# Patient Record
Sex: Female | Born: 1935
Health system: Southern US, Community
[De-identification: ages and names within clinical notes are randomized; demographics above are authoritative.]

## PROBLEM LIST (undated history)

## (undated) DIAGNOSIS — I10 Essential (primary) hypertension: Secondary | ICD-10-CM

## (undated) DIAGNOSIS — H9190 Unspecified hearing loss, unspecified ear: Secondary | ICD-10-CM

## (undated) DIAGNOSIS — N904 Leukoplakia of vulva: Secondary | ICD-10-CM

## (undated) DIAGNOSIS — M8580 Other specified disorders of bone density and structure, unspecified site: Secondary | ICD-10-CM

## (undated) DIAGNOSIS — E875 Hyperkalemia: Secondary | ICD-10-CM

## (undated) DIAGNOSIS — R209 Unspecified disturbances of skin sensation: Secondary | ICD-10-CM

## (undated) DIAGNOSIS — H18519 Endothelial corneal dystrophy, unspecified eye: Secondary | ICD-10-CM

## (undated) DIAGNOSIS — M1990 Unspecified osteoarthritis, unspecified site: Secondary | ICD-10-CM

## (undated) DIAGNOSIS — G709 Myoneural disorder, unspecified: Secondary | ICD-10-CM

## (undated) DIAGNOSIS — I639 Cerebral infarction, unspecified: Secondary | ICD-10-CM

## (undated) DIAGNOSIS — H1851 Endothelial corneal dystrophy: Secondary | ICD-10-CM

## (undated) DIAGNOSIS — K5900 Constipation, unspecified: Secondary | ICD-10-CM

## (undated) DIAGNOSIS — Z5189 Encounter for other specified aftercare: Secondary | ICD-10-CM

## (undated) DIAGNOSIS — C801 Malignant (primary) neoplasm, unspecified: Secondary | ICD-10-CM

## (undated) DIAGNOSIS — T8859XA Other complications of anesthesia, initial encounter: Secondary | ICD-10-CM

## (undated) DIAGNOSIS — D649 Anemia, unspecified: Secondary | ICD-10-CM

## (undated) DIAGNOSIS — T4145XA Adverse effect of unspecified anesthetic, initial encounter: Secondary | ICD-10-CM

## (undated) DIAGNOSIS — H01006 Unspecified blepharitis left eye, unspecified eyelid: Secondary | ICD-10-CM

## (undated) DIAGNOSIS — E039 Hypothyroidism, unspecified: Secondary | ICD-10-CM

## (undated) HISTORY — PX: ABDOMINAL HYSTERECTOMY: SHX81

## (undated) HISTORY — DX: Unspecified blepharitis left eye, unspecified eyelid: H01.006

## (undated) HISTORY — PX: EYE SURGERY: SHX253

## (undated) HISTORY — DX: Hyperkalemia: E87.5

## (undated) HISTORY — DX: Leukoplakia of vulva: N90.4

## (undated) HISTORY — PX: OTHER SURGICAL HISTORY: SHX169

## (undated) HISTORY — PX: FRACTURE SURGERY: SHX138

## (undated) HISTORY — DX: Myoneural disorder, unspecified: G70.9

## (undated) HISTORY — PX: CATARACT EXTRACTION, BILATERAL: SHX1313

---

## 1987-05-09 DIAGNOSIS — C801 Malignant (primary) neoplasm, unspecified: Secondary | ICD-10-CM

## 1987-05-09 HISTORY — PX: MASTECTOMY: SHX3

## 1987-05-09 HISTORY — DX: Malignant (primary) neoplasm, unspecified: C80.1

## 1987-07-07 HISTORY — PX: BREAST SURGERY: SHX581

## 1998-05-24 ENCOUNTER — Ambulatory Visit (HOSPITAL_COMMUNITY): Admission: RE | Admit: 1998-05-24 | Discharge: 1998-05-24 | Payer: Self-pay | Admitting: *Deleted

## 1998-05-24 ENCOUNTER — Encounter: Payer: Self-pay | Admitting: *Deleted

## 1998-05-24 ENCOUNTER — Other Ambulatory Visit: Admission: RE | Admit: 1998-05-24 | Discharge: 1998-05-24 | Payer: Self-pay | Admitting: *Deleted

## 1998-09-07 ENCOUNTER — Ambulatory Visit (HOSPITAL_COMMUNITY): Admission: RE | Admit: 1998-09-07 | Discharge: 1998-09-07 | Payer: Self-pay | Admitting: Obstetrics & Gynecology

## 1999-06-14 ENCOUNTER — Other Ambulatory Visit: Admission: RE | Admit: 1999-06-14 | Discharge: 1999-06-14 | Payer: Self-pay | Admitting: *Deleted

## 1999-09-27 ENCOUNTER — Ambulatory Visit (HOSPITAL_COMMUNITY): Admission: RE | Admit: 1999-09-27 | Discharge: 1999-09-27 | Payer: Self-pay | Admitting: Orthopedic Surgery

## 1999-09-27 ENCOUNTER — Encounter: Payer: Self-pay | Admitting: Orthopedic Surgery

## 2000-04-02 ENCOUNTER — Encounter: Payer: Self-pay | Admitting: Orthopedic Surgery

## 2000-04-09 ENCOUNTER — Inpatient Hospital Stay (HOSPITAL_COMMUNITY): Admission: RE | Admit: 2000-04-09 | Discharge: 2000-04-14 | Payer: Self-pay | Admitting: Orthopedic Surgery

## 2000-04-09 ENCOUNTER — Encounter: Payer: Self-pay | Admitting: Orthopedic Surgery

## 2000-06-28 ENCOUNTER — Other Ambulatory Visit: Admission: RE | Admit: 2000-06-28 | Discharge: 2000-06-28 | Payer: Self-pay | Admitting: *Deleted

## 2001-06-08 HISTORY — PX: OTHER SURGICAL HISTORY: SHX169

## 2001-06-10 ENCOUNTER — Encounter: Payer: Self-pay | Admitting: Orthopedic Surgery

## 2001-06-17 ENCOUNTER — Inpatient Hospital Stay (HOSPITAL_COMMUNITY): Admission: RE | Admit: 2001-06-17 | Discharge: 2001-06-20 | Payer: Self-pay | Admitting: Orthopedic Surgery

## 2001-06-17 ENCOUNTER — Encounter: Payer: Self-pay | Admitting: Orthopedic Surgery

## 2003-05-09 HISTORY — PX: ANKLE SURGERY: SHX546

## 2004-05-16 ENCOUNTER — Encounter: Admission: RE | Admit: 2004-05-16 | Discharge: 2004-05-16 | Payer: Self-pay | Admitting: Internal Medicine

## 2006-10-09 ENCOUNTER — Ambulatory Visit (HOSPITAL_COMMUNITY): Admission: RE | Admit: 2006-10-09 | Discharge: 2006-10-10 | Payer: Self-pay | Admitting: Orthopedic Surgery

## 2010-09-20 NOTE — Op Note (Signed)
NAMEHAYLO, [name] NO.:  [id number]   MEDICAL RECORD NO.:  [id number]          PATIENT TYPE:  AMB   LOCATION:  DAY                          FACILITY:  [facility]   PHYSICIAN:  Marlowe Kays, M.D.  DATE OF BIRTH:  Mar 01, 1932   DATE OF PROCEDURE:  10/09/2006  DATE OF DISCHARGE:                               OPERATIVE REPORT   PREOPERATIVE DIAGNOSIS:  Torn peroneus brevis tendon, right lateral  ankle and foot.   POSTOPERATIVE DIAGNOSIS:  Torn peroneus brevis tendon, right lateral  ankle and foot.   OPERATION:  Repair of peroneus brevis tendon tear right ankle.   SURGEON:  Marlowe Kays, M.D.   ASSISTANT:  Nurse.   ANESTHESIA:  General.   PATHOLOGY AND JUSTIFICATION FOR PROCEDURE:  She injured her ankle in mid  January.  Because of persistent pain, swelling, we obtained an MRI which  demonstrated a split tear of her peroneus brevis tendon.  This was on  09/06/2006.  She ultimately elected to have repair because of persistent  pain and is here today for the repair.  The tear had extended somewhat  since the MRI and ultimately was 5 cm with roughly in length and  extending proximal to the tip of the fibula and down within a few  centimeters of its insertion.   PROCEDURE:  Prophylactic antibiotics, satisfactory general anesthesia,  time-out performed pneumatic tourniquet, 325 mmHg.  Right leg was  Esmarched out non sterilely and prepped from mid calf to toes with  DuraPrep and draped in sterile field.  I marked out the central  landmarks with the tip of the distal fibula, base of the fifth  metatarsal and the proximal course of the peroneal tendons.  And after  making skin incision she had a prominent nerve present which I felt was  likely the lateral cutaneous branch of the peroneal nerve.  I tagged  this with a vascular loop and dissected proximally and distally to avoid  any injury.  Superior to this, I was able to when the peroneal tendon  sheath.  A  large amount of reactive fluid came forth.  The peroneus  longus tendon and deep to it, the peroneus brevis tendon was identified.  Protecting again the cutaneous nerve,  I was able to find the most  proximal portion of the tear which was greatest at the distal peroneal  retinaculum.  I excised with a 115 knife blade a portion of the  degenerated and attempted repair of tendon.  I then continued to follow  the tendon proximally and with a tear ultimately going up proximal to  the tip of fibula.  After getting to the most proximal portion of the  tear, I then began distally and using running 2-0 Vicryl for  tubulizations type of repair began a running stitch leaving only good  healthy tendon externally all way up to its most proximal extent where  the suture was securely tied.  This seemed to give a nice stable repair.  Because one of the pathologic problems seemed to be a very tight  peroneal retinaculum, I loosely tied  the retinaculum about the extensor  brevis tendon to allow stability but not impingement.  The wound was  irrigated well with sterile saline.  Soft tissues infiltrated with half  percent plain Marcaine.  The skin, subcu tissue were  approximated with interrupted 3-0 nylon mattress sutures.  Betadine  Adaptic dry sterile dressing with a short-leg splint cast was applied.  Tourniquet was released.  She tolerated the procedure well was taken to  the recovery satisfactory condition with no known complications.           ______________________________  Marlowe Kays, M.D.     JA/MEDQ  D:  10/09/2006  T:  10/09/2006  Job:  [id number]

## 2010-09-23 NOTE — Discharge Summary (Signed)
[facility]  Patient:    [name], [name]               MRN: [id number] Adm. Date:  [id number] Disc. Date: [id number] Attending:  Loanne Drilling Dictator:   Druscilla Brownie Shela Nevin, P.A.-C. CC:         Ammie Dalton, M.D.   Discharge Summary  ADMITTING DIAGNOSES: 1. Degenerative joint disease right hip. 2. Hypothyroidism. 3. History of intraductal carcinoma. 4. Status post left mastectomy. 5. Occasional hemorrhoids.  DISCHARGE DIAGNOSES: 1. Degenerative joint disease right hip. 2. Hypothyroidism. 3. History of intraductal carcinoma. 4. Status post left mastectomy. 5. Occasional hemorrhoids. 6. Mild postoperative anemia.  OPERATION:  On April 09, 2000, the patient underwent right total hip replacement arthroplasty.  Avel Peace, P.A.-C. assisted.  BRIEF HISTORY:  This is a 77 year old female with significant osteoarthritis of the right hip.  She has gone on to have limited function and pain with ambulation which is markedly interfering with her day-to-day activities.  This pain is refractory to nonoperative management, and it is felt she would benefit from the above procedure and was admitted for same.  HOSPITAL COURSE:  The patient tolerated the surgical procedure quite well and was placed on total hip protocol postoperatively.  Drain was removed from the hip on the first postop day without difficulty, and dressing was changed periodically.  The wound remained dry.  She was placed on Coumadin protocol for prevention of DVT.  This will continue about three weeks after surgery. It was felt that the patient might benefit from the rehabilitation program. We asked for a rehab consult; however, she did so nicely that that was not necessary.  Care management saw the patient and did not think that the patient needed an inpatient rehab program.  The home arrangements were made for her care and physical therapy through [facility] and  arrangements were made for discharge.  Neurovascular is intact in the operative extremity, and vital signs were stable.  LABORATORY VALUES IN THE HOSPITAL:  Hematologically showed the preoperative CBC with a hemoglobin of 11.4, hematocrit 33.9, otherwise within normal limits.  Final hemoglobin was 9.3.  Blood chemistries all were normal. Urinalysis negative for urinary tract infection.  Chest x-ray showed "no acute disease."  Electrocardiogram showed sinus rhythm, normal ECG.  CONDITION ON DISCHARGE:  Improved and stable.  PLAN:  The patient is discharged to home in the care of her family to continue with total hip protocol at home.  Return to see Dr. Lequita Halt about two weeks after surgery.  Continue home medications and diet.  DISCHARGE MEDICATIONS:  We have given her Percocet 1 q.4-6h. p.r.n. pain, and she is to continue on the Coumadin protocol for about three weeks. DD:  04/14/00 TD:  04/15/00 Job: [id number]

## 2010-09-23 NOTE — Op Note (Signed)
[facility]  Patient:    [name], [name]               MRN: [id number] Proc. Date: 04/09/00 Adm. Date:  [id number] Attending:  Ollen Gross V                           Operative Report  PREOPERATIVE DIAGNOSIS:  Osteoarthritis of the right hip.  POSTOPERATIVE DIAGNOSIS:  Osteoarthritis of the right hip.  OPERATION PERFORMED:  Right total hip arthroplasty.  SURGEON:  Ollen Gross, M.D.  ASSISTANT:  Alexzandrew L. Perkins, P.A.-C.  ANESTHESIA:  Spinal.  ESTIMATED BLOOD LOSS:  200 cc.  DRAINS:  Hemovac x 1.  COMPLICATIONS:  None.  CONDITION:  Stable to recovery.  INDICATIONS FOR PROCEDURE:  [name] is a 77 year old female who has significant osteoarthritis of the right hip with limited function and pain refractory to nonoperative management.  She presents now for a total hip arthroplasty.  DESCRIPTION OF PROCEDURE:  After successful administration of spinal anesthetic, the patient was placed in left lateral decubitus position, right side up and held with hip positioner.  The right lower extremity was isolated from the perineum with plastic drapes and prepped and draped in the usual sterile fashion.  Standard posterolateral incisions were made.  Skin cut with a 10 blade, subcutaneous tissues to the level of the fascia lata which was incised in line with the skin incision.  Short external rotators were isolated off the femur.  Capsulectomy was then performed.  The hip was then dislocated and the center of femoral head marked.  Per preop templating, the trial was placed such that the center of the trial head is corresponding to the center of her native femoral head.  ____________  line was then marked on the femur and osteotomy made with the oscillating saw.  The femur was then retracted anteriorly.  Anterior capsule then removed.  Then retractors placed around the acetabulum.  Acetabular reaming was then initiated with 44  coursing up to a 49.  A size 50 pinnacle cup was impacted into the acetabulum with 40 degrees of abduction and 20 degrees of forward flexion.  It was transfixed with two dome screws.  A trial neutral with 28 mm liner was then placed.  Femoral preparation initiated first with the starter reamer and canal finder. The canal was then copiously irrigated and broaching started with a size 1 and size 2.  Per preop templating, size 3 would be most appropriate and a 3 high off-set neck is placed and a  ____________ 1.5 head placed.  The hip was then reduced and there was outstanding stability in full extension and full external rotation with 70 degrees flexion, 40 degrees adduction, 90 degrees internal rotation and 90 degrees flexion and 90 degrees internal rotation. Soft tissue tension is appropriate.  At this point the trials were removed, femur retracted anteriorly and the Apex hole eliminator and permanent 28 mm neutral Marathon liner impacted into the acetabular shell.  The canal has been prepared for cementing with the canal restrictor placed at the appropriate level.  It was then irrigated with pulsatile lavage, cement mixed and the cement was injected and pressured.  The permanent size 3 high offset Endurance Luster stem was then placed matching her native anteversion.  Once the cement was fully hardened, a 28 mm plus 1.5 head was placed.  The hip reduced and once again same stability parameters were noted.  A permanent 28 mm +1.5 head was then placed onto the femoral neck.  The hip was reduced and same stability parameters noted.  The wound was copiously irrigated with antibiotic solution.  Short external rotators were reattached to the femur through drill holes.  The fascia lata and fascia of the gluteus max were then closed over one limb of a Hemovac drain with interrupted #1 Vicryl.  Subcutaneous closed in two layers with interrupted #1 and 2-0 Vicryl and subcuticular with 4-0  Monocryl.  The incisions were clean and dry and Steri-Strips and bulky sterile dressing applied.  Drains hooked to suction. She was placed into a knee immobilizer, awakened and transported to recovery in stable condition. DD:  04/09/00 TD:  04/09/00 Job: [id number]

## 2010-09-23 NOTE — H&P (Signed)
[facility]  Patient:    [name], [name] Visit Number: [id number] MRN: [id number]          Service Type: Attending:  Ollen Gross, M.D. Dictated by:   Alexzandrew L. Perkins, P.A.-C. Adm. Date:  06/17/01   CC:         Gaspar Garbe, M.D., [facility], [address] [place], [address] [address], [place] [address]  Ollen Gross, M.D.   History and Physical  CHIEF COMPLAINT:  Left hip pain.  HISTORY OF PRESENT ILLNESS:  The patient is a 77 year old female who has been seen in follow up by Dr. Lequita Halt for hip pain.  She is approximately just a little over 1 year out from her right total hip replacement arthroplasty and has been doing quite well with her right hip.  She has had a great result. She is noted to have increasing pain in the left side with advancing degenerative osteoarthritis.  The left hip has progressed now to the point where it is continuing to interfere with her daily activities and the pain has reached a point where she would like to pursue a left total hip replacement arthroplasty.  She had benefit from undergoing the right hip arthroplasty and it is felt she is an appropriate candidate for a left hip replacement.  The risks and benefits of the procedure have been discussed with the patient and she has elected to proceed with surgery.  ALLERGIES:  No known drug allergies.  INTOLERANCES:  VIOXX caused increased blood pressure.  CELEBREX caused the inability to sleep.  The patient is LACTOSE INTOLERANCE and EPINEPHRINE causes "heart racing."  CURRENT MEDICATIONS: 1. Synthroid 0.15 mg per day. 2. Estradiol 1 mg daily. 3. Fosamax 70 mg every week. 4. Vitamin E, glucosamine and chondroitin, calcium supplements and multivitamin.  PAST MEDICAL HISTORY:  Hypothyroidism, osteoporosis, occasional hemorrhoids and also a history of intraductal breast carcinoma.  PAST SURGICAL HISTORY:  Left mastectomy  secondary to intraductal carcinoma in March of 1989.  Hysterectomy secondary to benign tumor in 1991.  She has undergone a right total hip replacement in December of 2001.  SOCIAL HISTORY:  The patient is single.  She is a retired Radiographer, therapeutic.  She denies any tobacco products at this time.  She does have a prior history of a 1 pack per day smoking history for approximately 10 years.  She quit at age 9.  Only occasional intake of alcohol.  FAMILY HISTORY:  Mother deceased age 41, secondary to Hodgkin disease.  Father deceased age 53 with a history of melanoma. She has 1 brother who is deceased secondary to a cancerous brain tumor.  She has 1 brother who has osteoporosis and also emphysema and also has a sister.  REVIEW OF SYSTEMS:  General:  No fevers, chills, night sweats.  Neurologic: No seizures or paralysis.  Respiratory:  The patient has a had a recent cold in January, however, this has resolved.  No shortness of breath, productive cough, or hemoptysis. Cardiovascular:  No chest pain, angina or orthopnea. GI:  No nausea, vomiting, diarrhea, or constipation.  GU:  No dysuria, hematuria or discharge.  Musculoskeletal:  Pertinent to the left hip in the history of present illness.  PHYSICAL EXAMINATION:  VITAL SIGNS:  Pulse 54, respirations 16, blood pressure 138/84.  GENERAL:  The patient is a 77 year old white female well-nourished well-developed, and appears to be in no acute distress.  She  is alert, oriented and cooperative.  She appears to be an excellent historian.  HEENT:  Normocephalic and atraumatic.  Pupils, equal, round, reactive.  EOMs are intact.  Oropharynx is clear.  NECK: Supple.  No carotid bruits are appreciated.  CHEST: Clear to auscultation anterior and posterior chest walls.  HEART:  Regular rate and rhythm, no murmurs, S1, S2 noted.  No rubs, thrills or palpitations.  ABDOMEN:  Soft, flat, nontender.  Bowel sounds are present.  RECTAL/BREAST AND  GENITALIA:  Not done.  Not pertinent to present illness.  EXTREMITIES:  Left lower extremity:  She has hip flexion approximately at 95 degrees, internal rotation at 30 degrees, external rotation 40 degrees, and abduction of 40 degrees.  She does have pain on passive range of motion on the left side and walks with somewhat of a mildly antalgic gait.  PREOPERATIVE CHEST X-RAY:  Sent over from [facility] states the lungs are clear and well expanded.  Heart and pulmonary vessels are normal.  No significant abnormalities in the noted in the regional skeleton. No active disease.  This is confirmed by Dr. Hubbard Robinson.  LEFT HIP FILMS:  Sent over from [facility] preadmitting shows severe osteoarthritis in the left hip.  EKG:  The EKG interpretation was sent over in a clearance letter which is read as an EKG was performed which shows normal sinus rhythm with a slightly bradycardia rate of 55 but no changes consistent with prior heart damage or ischemia is noted.  IMPRESSION: 1. Left hip osteoarthritis. 2. Hypothyroidism. 3. Osteopenia. 4. Hemorrhoids.  PLAN:  The patient will be admitted to [facility] to undergo left total hip arthroplasty.  Her medical physician is Dr. Guerry Bruin at [facility].  He will be notified of the room number admission and be consulted if needed for any medical assistance with this patient throughout the hospital course.Dictated by:   Alexzandrew L. Perkins, P.A.-C.  Attending:  Ollen Gross, M.D. DD:  06/13/01 TD:  06/13/01 Job: [id number]

## 2010-09-23 NOTE — Discharge Summary (Signed)
[facility]  Patient:    [name], [name] Visit Number: [id number] MRN: [id number]          Service Type: SUR Location: 4W 0464 01 Attending Physician:  Loanne Drilling Dictated by:   Druscilla Brownie Shela Nevin, P.A. Admit Date:  06/17/2001 Discharge Date: 06/20/2001   CC:         Gaspar Garbe, M.D.   Discharge Summary  ADMISSION DIAGNOSES: 1. Severe left hip osteoarthritis. 2. Hypothyroidism. 3. Osteopenia. 4. Hemorrhoids.  DISCHARGE DIAGNOSES: 1. Severe left hip osteoarthritis. 2. Hypothyroidism. 3. Osteopenia. 4. Hemorrhoids. 5. Mild postoperative anemia.  OPERATION:  On 06/17/01, the patient underwent left total hip replacement arthroplasty.  Avel Peace, P.A.-C. assisted.  CONSULTATIONS:  None.  HISTORY OF PRESENT ILLNESS:  This 77 year old lady who has had progressive and painful osteoarthritis of the left hip is having this problem interfering with her day to day activities with severe limitation.  She has nearly constant groin pain.  X-rays have shown deterioration of the joint.  After much discussion, it was felt this patient would benefit from surgical intervention, admitted for the above procedure.  HOSPITAL COURSE:  The patient tolerated the surgical procedure quite well. She entered readily and eagerly into the rehabilitation program postoperatively.  She was placed on Coumadin protocol postoperatively for the prevention of deep venous thrombosis, and will continue so for 3 weeks after surgery.  Wound remained dry without any evidence of infection.  She remained afebrile.  On the day of discharge, she was ambulating in the hall with little difficulty, home arrangements had been made.  Hemoglobin was 9.6, hematocrit was 28.3.  Electrolytes were okay.  She was to be discharged home in the care of her family with home health by [place].  LABORATORY DATA:  Hematologically, showed a CBC with  differential preoperatively which was within normal limits.  Again, final hemoglobin was 9.6, hematocrit 28.3 (which was coming up from previous hemoglobin and hematocrit).  Blood chemistries were normal.  Chest x-ray from [facility] showed no active disease.  Electrocardiogram showed normal sinus rhythm.  CONDITION ON DISCHARGE:  Improved and stable.  PLAN:  The patient will be discharged to her home in the care of her family and friends.  She will have home health via [place].  ACTIVITY:  Partial weightbearing of the operative extremity.  FOLLOWUP:  Dr. Lequita Halt this next Tuesday.  DISCHARGE MEDICATIONS: 1. Continue home medications. 2. Percocet 5 mg #40 one or two q.4-6h. p.r.n. pain, no refill. 3. Robaxin 500 mg #30 with one refill one q.6h. p.r.n. muscle spasm. 4. Tylenol over-the-counter p.r.n.  DIET:  Continue home diet. Dictated by:   Druscilla Brownie. Shela Nevin, P.A. Attending Physician:  Loanne Drilling DD:  06/20/01 TD:  06/20/01 Job: [id number]

## 2010-09-23 NOTE — H&P (Signed)
[facility]  Patient:    [name], [name]                      MRN: [id number] Adm. Date:  04/09/00 Attending:  Ollen Gross, M.D. Dictator:   Alexzandrew L. Julien Girt, P.A.-C. CC:         Ammie Dalton, M.D.   History and Physical  DATE OF BIRTH:  1932-01-01  DATE OF OFFICE VISIT:  April 02, 2000  CHIEF COMPLAINT:  Right hip pain.  HISTORY OF PRESENT ILLNESS:  The patient is a 77 year old female who is seen for a second opinion for right hip pain by Ollen Gross, M.D. She was subsequently referred over to Ollen Gross, M.D. for a second opinion. She was seen by Elana Alm. Thurston Hole, M.D. 3 months ago and was recommended to undergo hip replacement. She came in for evaluation of second opinion. A 77 year old female that has had increasing right hip discomfort for several years now. She is quite active and does much hiking. However, her activity level has been depleted and hindered by the increasing pain in her right hip. She says she has a very high threshold of pain. However, she is starting to have difficulty with daily activities in her active lifestyle. She comes in the office and found to have on x-rays joint-space narrowing on both sides on her AP pelvic film with nearly complete obliteration of the joint space on the right with some joint space remaining on the left. She just had bone-on-bone changes noted with cystic formation. It is felt she has reached a point where she could benefit from undergoing total hip replacement and arthroplasty. Risks and benefits of this have been discussed at length with her. She has elected to proceed with surgery.  ALLERGIES:  No known drug allergies.  INTOLERANCES:  VIOXX causes increased blood pressure. CELEBREX causes the inability to sleep. The patient is LACTOSE INTOLERANT. EPINEPHRINE causes "heart racing."  CURRENT MEDICATIONS: 1. Synthroid 0.15 mg daily. 2. Estrace 1 mg daily. 3. Vioxx 25  mg daily. 4. Multivitamin daily. 5. Glucosamine/chondroitin daily. 6. Vitamin E 400 IU daily. 7. Calcium with vitamin C two tablets daily. 8. Fosamax 70 mg one tablet once a week.  PAST MEDICAL HISTORY:  Hypothyroidism, osteoporosis, occasional hemorrhoids and history of intraductal breast carcinoma.  PAST SURGICAL HISTORY:  Left mastectomy, total abdominal hysterectomy with BSO, surgical repair of a crush injury to the right index finger tip.  SOCIAL HISTORY:  The patient is single. Denies use of tobacco products. Alcohol intake of one glass of wine in the evenings.  FAMILY HISTORY:  Mother deceased age 30 with Hodgkins disease. Father deceased age 74 with melanoma. Brother deceased age 81 secondary to a brain tumor.  REVIEW OF SYSTEMS:  GENERAL:  No fevers, chills, or night sweats. NEUROLOGICAL:  No seizures, syncope, or paralysis. RESPIRATORY:  No shortness of breath, productive cough, or hemoptysis. CARDIOVASCULAR:  No chest pain, angina, or orthopnea. GI:  No nausea, vomiting, some intermittent diarrhea. No constipation. No blood or mucus in the stool. GU:  No discharge, dysuria, or hematuria. MUSCULOSKELETAL:  Pertinent to that of the right hip found in the history of present illness.  PHYSICAL EXAMINATION:  GENERAL:  The patient is a 77 year old white female, thin frame, well-nourished, well-developed appears to be in no acute distress. She is noted to wear glasses.  VITAL SIGNS:  Blood pressure is elevated at 140/110, pulse 60, respirations are 16.  HEENT:  Normocephalic,  atraumatic. Pupils are round and reactive. EOMs are intact.  NECK:  Supple. No carotid bruits.  CHEST:  Clear to auscultation and percussion. No rhonchi or rales.  HEART:  Regular rate and rhythm. No murmurs of S1/S2 noted.  ABDOMEN:  Soft, flat, abdomen nontender to palpation. Bowel sounds are present.  GENITALIA/RECTAL/BREASTS:  Not done. Not pertinent to present illness.  EXTREMITIES:   Significant to the right lower extremity. Right hip flexion to 90 degrees, only 10 degrees of internal rotation, 20 degrees of external rotation, 40 degrees of abduction. Motor function is grossly intact. Movement of foot and toes well on exam.  LABORATORY DATA:  X-rays show bone-on-bone changes in the right hip with complete obliteration of the joint space with cystic formation.  IMPRESSION: 1. Degenerative joint disease, right hip. 2. Hypothyroidism. 3. History of intraductal carcinoma. 4. Status post left mastectomy. 5. Occasional hemorrhoids.  PLAN:  The patient will be admitted to [facility] to undergo right total hip replacement. Surgery will be performed by Ollen Gross, M.D. The patients medical physician is Ammie Dalton, M.D. Ammie Dalton, M.D. will be notified of the room number, and she will be consulted if needed for any medical assistance with this patient throughout the hospital course. DD:  04/02/00 TD:  04/02/00 Job: [id number]

## 2010-09-23 NOTE — Op Note (Signed)
[facility]  Patient:    [name], [name] Visit Number: [id number] MRN: [id number]          Service Type: SUR Location: [id number] Attending Physician:  Loanne Drilling Dictated by:   Ollen Gross, M.D. Proc. Date: 06/17/01 Admit Date:  06/17/2001                             Operative Report  PREOPERATIVE DIAGNOSIS:  Osteoarthritis left hip.  POSTOPERATIVE DIAGNOSIS:  Osteoarthritis left hip.  OPERATION/PROCEDURE:  Left total hip arthroplasty.  SURGEON:  Ollen Gross, M.D.  ASSISTANT:  Avel Peace.  ANESTHESIA:  Spinal.  ESTIMATED BLOOD LOSS:  600 cc.  DRAIN:  Hemovac x1.  COMPLICATIONS:  None.  CONDITION:  Stable to recovery.  BRIEF CLINICAL NOTE:  This patient is a 77 year old female with osteoarthritis of the left hip with pain and activity limitations with fracture now for operative management.  She presents now for left total hip arthroplasty.  PROCEDURE DETAILS:  After successful administration of spinal anesthetic the patient was placed in the right lateral decubitus position with the left side up in the LOA position.  The left lower extremity was isolated for perineoplasty and the adjacent area prepped and draped in the usual sterile fashion.  Mini-posterolateral incisions made with the 10 blade through the subcutaneous tissue to the level of the fascia lata which was incised in line of the deep incision.  The sciatic nerve was palpated and protected and the short rotator was isolated out the femur.  Capsulectomy was then performed, head dislocated and center of the femoral head marked.  Trial prosthesis was placed in the center of the trial head corresponds to the center of the femoral head.  Osteotomy was then made with an oscillating saw.  The femur was retracted anteriorly and anterior capsule removed and acetabular exposure obtained.  The acetabular reaming began on 45 proceeding in increments of 2 to 49.  A 50  mm pinnacle acetabular shell was then placed in anatomic position and transfixed with a normal screw.  Trial of 28 mm neutral liner is placed. The femur was prepared first with the canal finder.  The canal was irrigated and then broached to service a size 1 and a size 2.  Her other size was a size 3 high offset and we felt that this was going to be the same so we put a 3-high neck on there with a 28 +1.5 head.  Hip reduced with great stability, full extension, full external rotation in 70 degrees flexion, 40 degrees of adduction, 90 degrees in internal rotation, and 90 degrees flexion, 70 degrees internal rotation.  The wound was copiously irrigated and all trials removed.  The apex hole illuminator was placed into the acetabular shell and the permanent 28 mm neutral Marathon liner was placed.  Size 4 cement restrictor was most appropriate and was placed at the appropriate depth in the femoral canal and then then canal was irrigated with pulsatile lavage. The cement was mixed and once firm the locations were injected into the canal and pressurized.  Size 3 Endurance Luster high offset stem was placed matching remained with anteversion.  Extruded cement was removed.  Once the cement was fully hardened the permanent 28 +1.5 head was placed.  Reduce it seems to noted parameters.  The wound was copiously irrigated with antibiotic solution, short stem rotator was reattached to the femur through drill holes. Fascia  lata was closed over a Hemovac drain with interrupted #1 Vicryl subcu, closed with interrupted #1 and interrupted 2-0 Vicryl and subcuticular running 4-0 Monocryl.  The incision was then cleaned and dried and a bulky sterile dressing applied.  The patient was placed in a knee immobilizer, awakened, and transported to recovery in stable condition. Dictated by:   Ollen Gross, M.D. Attending Physician:  Loanne Drilling DD:  06/17/01 TD:  06/17/01 Job: [id number]

## 2011-02-23 LAB — HEMOGLOBIN AND HEMATOCRIT, BLOOD
HCT: 41.4
Hemoglobin: 13.7

## 2011-02-23 LAB — BASIC METABOLIC PANEL
CO2: 28
Calcium: 9.6
Creatinine, Ser: 0.67
GFR calc Af Amer: >60
GFR calc non Af Amer: >60
Sodium: 137

## 2011-02-23 LAB — PROTIME-INR: Prothrombin Time: 13

## 2011-05-04 ENCOUNTER — Ambulatory Visit (INDEPENDENT_AMBULATORY_CARE_PROVIDER_SITE_OTHER): Payer: Medicare Other

## 2011-05-04 DIAGNOSIS — I10 Essential (primary) hypertension: Secondary | ICD-10-CM

## 2011-05-04 DIAGNOSIS — M25559 Pain in unspecified hip: Secondary | ICD-10-CM

## 2011-07-27 ENCOUNTER — Other Ambulatory Visit: Payer: Self-pay | Admitting: Orthopedic Surgery

## 2011-07-27 ENCOUNTER — Ambulatory Visit (HOSPITAL_COMMUNITY)
Admission: RE | Admit: 2011-07-27 | Discharge: 2011-07-27 | Disposition: A | Discharge status: Home / Self Care | Payer: Medicare Other | Source: Ambulatory Visit | Attending: Orthopedic Surgery | Admitting: Orthopedic Surgery

## 2011-07-27 ENCOUNTER — Encounter (HOSPITAL_COMMUNITY): Payer: Self-pay

## 2011-07-27 ENCOUNTER — Encounter (HOSPITAL_COMMUNITY)
Admission: RE | Admit: 2011-07-27 | Discharge: 2011-07-27 | Disposition: A | Discharge status: Home / Self Care | Payer: Medicare Other | Source: Ambulatory Visit | Attending: Orthopedic Surgery | Admitting: Orthopedic Surgery

## 2011-07-27 ENCOUNTER — Encounter (HOSPITAL_COMMUNITY): Payer: Self-pay | Admitting: Pharmacy Technician

## 2011-07-27 ENCOUNTER — Other Ambulatory Visit: Payer: Self-pay

## 2011-07-27 DIAGNOSIS — Z01812 Encounter for preprocedural laboratory examination: Secondary | ICD-10-CM | POA: Insufficient documentation

## 2011-07-27 DIAGNOSIS — Y831 Surgical operation with implant of artificial internal device as the cause of abnormal reaction of the patient, or of later complication, without mention of misadventure at the time of the procedure: Secondary | ICD-10-CM | POA: Insufficient documentation

## 2011-07-27 DIAGNOSIS — I517 Cardiomegaly: Secondary | ICD-10-CM | POA: Insufficient documentation

## 2011-07-27 DIAGNOSIS — E039 Hypothyroidism, unspecified: Secondary | ICD-10-CM

## 2011-07-27 DIAGNOSIS — Z01818 Encounter for other preprocedural examination: Secondary | ICD-10-CM | POA: Insufficient documentation

## 2011-07-27 DIAGNOSIS — Z96649 Presence of unspecified artificial hip joint: Secondary | ICD-10-CM | POA: Insufficient documentation

## 2011-07-27 DIAGNOSIS — M1990 Unspecified osteoarthritis, unspecified site: Secondary | ICD-10-CM

## 2011-07-27 DIAGNOSIS — T84049A Periprosthetic fracture around unspecified internal prosthetic joint, initial encounter: Secondary | ICD-10-CM | POA: Insufficient documentation

## 2011-07-27 HISTORY — PX: SQUAMOUS CELL CARCINOMA EXCISION: SHX2433

## 2011-07-27 HISTORY — PX: JOINT REPLACEMENT: SHX530

## 2011-07-27 HISTORY — DX: Hypothyroidism, unspecified: E03.9

## 2011-07-27 HISTORY — DX: Unspecified osteoarthritis, unspecified site: M19.90

## 2011-07-27 HISTORY — DX: Encounter for other specified aftercare: Z51.89

## 2011-07-27 HISTORY — DX: Malignant (primary) neoplasm, unspecified: C80.1

## 2011-07-27 LAB — URINALYSIS, ROUTINE W REFLEX MICROSCOPIC
Bilirubin Urine: NEGATIVE
Nitrite: NEGATIVE
Specific Gravity, Urine: 1.023 (ref 1.005–1.030)
pH: 6 (ref 5.0–8.0)

## 2011-07-27 LAB — PROTIME-INR
INR: 0.94 (ref 0.00–1.49)
Prothrombin Time: 12.8 seconds (ref 11.6–15.2)

## 2011-07-27 LAB — COMPREHENSIVE METABOLIC PANEL
BUN: 15 mg/dL (ref 6–23)
CO2: 33 mEq/L — ABNORMAL HIGH (ref 19–32)
Chloride: 100 mEq/L (ref 96–112)
Creatinine, Ser: 0.63 mg/dL (ref 0.50–1.10)
GFR calc Af Amer: >90 mL/min (ref >90)
GFR calc non Af Amer: 84 mL/min — ABNORMAL LOW (ref >90)
Glucose, Bld: 105 mg/dL — ABNORMAL HIGH (ref 70–99)
Total Bilirubin: 0.3 mg/dL (ref 0.3–1.2)

## 2011-07-27 LAB — CBC
HCT: 38.5 % (ref 36.0–46.0)
MCV: 86.9 fL (ref 78.0–100.0)
RBC: 4.43 MIL/uL (ref 3.87–5.11)
WBC: 6.4 10*3/uL (ref 4.0–10.5)

## 2011-07-27 MED ORDER — CHLORHEXIDINE GLUCONATE 4 % EX LIQD: 60.0000 mL | Freq: Once | CUTANEOUS | Status: DC

## 2011-07-27 NOTE — Patient Instructions (Signed)
[address] AMMA CREAR  07/27/2011   Your procedure is scheduled on: 3-25  -2013  Report to [facility] at   3:00  PM.  Call this number if you have problems the morning of surgery: [phone]   Remember:   Do not eat food:After Midnight.  May have clear liquids:up to 6 Hours before arrival. Nothing after : 1100AM  Clear liquids include soda, tea, black coffee, apple or grape juice, broth.  Take these medicines the morning of surgery with A SIP OF WATER: Synthroid   Do not wear jewelry, make-up or nail polish.  Do not wear lotions, powders, or perfumes. You may wear deodorant.  Do not shave 48 hours prior to surgery.(face and neck okay, no shaving of legs)  Do not bring valuables to the hospital.  Contacts, dentures or bridgework may not be worn into surgery.  Leave suitcase in the car. After surgery it may be brought to your room.  For patients admitted to the hospital, checkout time is 11:00 AM the day of discharge.   Patients discharged the day of surgery will not be allowed to drive home.  Name and phone number of your driver: will arrange  Special Instructions: CHG Shower Use Special Wash: 1/2 bottle night before surgery and 1/2 bottle morning of surgery.(avoid face and genitals)   Please read over the following fact sheets that you were given: MRSA Information, Blood Transfusion fact sheet, Incentive Spirometry Instruction.

## 2011-07-27 NOTE — Pre-Procedure Instructions (Signed)
07-27-11 EKG/ CXR/  RT.Hip complete Xray done today.

## 2011-07-30 LAB — MRSA CULTURE

## 2011-07-31 ENCOUNTER — Encounter (HOSPITAL_COMMUNITY)
Admission: RE | Disposition: A | Discharge status: Home Health | Payer: Self-pay | Source: Ambulatory Visit | Attending: Orthopedic Surgery

## 2011-07-31 ENCOUNTER — Inpatient Hospital Stay (HOSPITAL_COMMUNITY)
Admission: RE | Admit: 2011-07-31 | Discharge: 2011-08-03 | DRG: 481 | Disposition: A | Discharge status: Home Health | Payer: Medicare Other | Source: Ambulatory Visit | Attending: Orthopedic Surgery | Admitting: Orthopedic Surgery

## 2011-07-31 ENCOUNTER — Encounter (HOSPITAL_COMMUNITY): Payer: Self-pay | Admitting: *Deleted

## 2011-07-31 ENCOUNTER — Ambulatory Visit (HOSPITAL_COMMUNITY): Payer: Medicare Other

## 2011-07-31 ENCOUNTER — Ambulatory Visit (HOSPITAL_COMMUNITY): Payer: Medicare Other | Admitting: Anesthesiology

## 2011-07-31 ENCOUNTER — Encounter (HOSPITAL_COMMUNITY): Payer: Self-pay | Admitting: Anesthesiology

## 2011-07-31 ENCOUNTER — Encounter (HOSPITAL_COMMUNITY): Payer: Self-pay | Admitting: Orthopedic Surgery

## 2011-07-31 DIAGNOSIS — S72309A Unspecified fracture of shaft of unspecified femur, initial encounter for closed fracture: Principal | ICD-10-CM | POA: Diagnosis present

## 2011-07-31 DIAGNOSIS — M1610 Unilateral primary osteoarthritis, unspecified hip: Secondary | ICD-10-CM | POA: Diagnosis present

## 2011-07-31 DIAGNOSIS — E871 Hypo-osmolality and hyponatremia: Secondary | ICD-10-CM

## 2011-07-31 DIAGNOSIS — M978XXA Periprosthetic fracture around other internal prosthetic joint, initial encounter: Secondary | ICD-10-CM

## 2011-07-31 DIAGNOSIS — E039 Hypothyroidism, unspecified: Secondary | ICD-10-CM | POA: Diagnosis present

## 2011-07-31 DIAGNOSIS — M169 Osteoarthritis of hip, unspecified: Secondary | ICD-10-CM | POA: Diagnosis present

## 2011-07-31 DIAGNOSIS — Z853 Personal history of malignant neoplasm of breast: Secondary | ICD-10-CM

## 2011-07-31 DIAGNOSIS — D62 Acute posthemorrhagic anemia: Secondary | ICD-10-CM

## 2011-07-31 DIAGNOSIS — Z9289 Personal history of other medical treatment: Secondary | ICD-10-CM

## 2011-07-31 DIAGNOSIS — W010XXA Fall on same level from slipping, tripping and stumbling without subsequent striking against object, initial encounter: Secondary | ICD-10-CM | POA: Diagnosis present

## 2011-07-31 DIAGNOSIS — Z96649 Presence of unspecified artificial hip joint: Secondary | ICD-10-CM

## 2011-07-31 HISTORY — PX: ORIF FEMUR FRACTURE: SHX2119

## 2011-07-31 SURGERY — OPEN REDUCTION INTERNAL FIXATION (ORIF) DISTAL FEMUR FRACTURE
Anesthesia: General | Site: Hip | Laterality: Right | Wound class: Clean

## 2011-07-31 MED ORDER — ACETAMINOPHEN 650 MG RE SUPP: 650.0000 mg | Freq: Four times a day (QID) | RECTAL | Status: DC | PRN

## 2011-07-31 MED ORDER — DEXAMETHASONE SODIUM PHOSPHATE 10 MG/ML IJ SOLN
10.0000 mg | Freq: Once | INTRAMUSCULAR | Status: DC
  Filled 2011-07-31: qty 1

## 2011-07-31 MED ORDER — POLYETHYLENE GLYCOL 3350 17 G PO PACK
17.0000 g | PACK | Freq: Every day | ORAL | Status: DC | PRN
  Administered 2011-08-02 – 2011-08-03 (×2): 17 g via ORAL
  Filled 2011-07-31 (×2): qty 1

## 2011-07-31 MED ORDER — MEPERIDINE HCL 50 MG/ML IJ SOLN
INTRAMUSCULAR | Status: AC
  Filled 2011-07-31: qty 1

## 2011-07-31 MED ORDER — SODIUM CHLORIDE 0.9 % IV SOLN
INTRAVENOUS | Status: DC
  Administered 2011-07-31: via INTRAVENOUS
  Administered 2011-08-01 – 2011-08-02 (×2): 20 mL/h via INTRAVENOUS

## 2011-07-31 MED ORDER — ACETAMINOPHEN 325 MG PO TABS
650.0000 mg | ORAL_TABLET | Freq: Four times a day (QID) | ORAL | Status: DC | PRN
  Administered 2011-08-02: 650 mg via ORAL
  Filled 2011-07-31: qty 2

## 2011-07-31 MED ORDER — CEFAZOLIN SODIUM 1-5 GM-% IV SOLN
1.0000 g | Freq: Four times a day (QID) | INTRAVENOUS | Status: AC
  Administered 2011-08-01 (×3): 1 g via INTRAVENOUS
  Filled 2011-07-31 (×3): qty 50

## 2011-07-31 MED ORDER — HYDROMORPHONE HCL PF 1 MG/ML IJ SOLN
0.2500 mg | INTRAMUSCULAR | Status: DC | PRN
  Administered 2011-07-31: 0.25 mg via INTRAVENOUS

## 2011-07-31 MED ORDER — RIVAROXABAN 10 MG PO TABS
10.0000 mg | ORAL_TABLET | Freq: Every day | ORAL | Status: DC
  Administered 2011-08-01 – 2011-08-03 (×3): 10 mg via ORAL
  Filled 2011-07-31 (×3): qty 1

## 2011-07-31 MED ORDER — ONDANSETRON HCL 4 MG PO TABS: 4.0000 mg | ORAL_TABLET | Freq: Four times a day (QID) | ORAL | Status: DC | PRN

## 2011-07-31 MED ORDER — FLEET ENEMA 7-19 GM/118ML RE ENEM: 1.0000 | ENEMA | Freq: Once | RECTAL | Status: AC | PRN

## 2011-07-31 MED ORDER — ACETAMINOPHEN 10 MG/ML IV SOLN
INTRAVENOUS | Status: AC
  Filled 2011-07-31: qty 100

## 2011-07-31 MED ORDER — OXYCODONE HCL 5 MG PO TABS
5.0000 mg | ORAL_TABLET | ORAL | Status: DC | PRN
  Administered 2011-08-01 (×2): 10 mg via ORAL
  Administered 2011-08-01: 5 mg via ORAL
  Administered 2011-08-01 (×2): 10 mg via ORAL
  Administered 2011-08-02 – 2011-08-03 (×7): 5 mg via ORAL
  Filled 2011-07-31: qty 1
  Filled 2011-07-31: qty 2
  Filled 2011-07-31 (×5): qty 1
  Filled 2011-07-31 (×2): qty 2
  Filled 2011-07-31: qty 1
  Filled 2011-07-31 (×2): qty 2
  Filled 2011-07-31: qty 1

## 2011-07-31 MED ORDER — BUPIVACAINE IN DEXTROSE 0.75-8.25 % IT SOLN
INTRATHECAL | Status: DC | PRN
  Administered 2011-07-31: 13 mg via INTRATHECAL

## 2011-07-31 MED ORDER — FENTANYL CITRATE 0.05 MG/ML IJ SOLN
INTRAMUSCULAR | Status: DC | PRN
  Administered 2011-07-31 (×2): 50 ug via INTRAVENOUS

## 2011-07-31 MED ORDER — TRAMADOL HCL 50 MG PO TABS
50.0000 mg | ORAL_TABLET | Freq: Four times a day (QID) | ORAL | Status: DC | PRN
  Administered 2011-08-03: 100 mg via ORAL
  Filled 2011-07-31: qty 2

## 2011-07-31 MED ORDER — ACETAMINOPHEN 10 MG/ML IV SOLN
1000.0000 mg | Freq: Four times a day (QID) | INTRAVENOUS | Status: AC
  Administered 2011-08-01 (×4): 1000 mg via INTRAVENOUS
  Filled 2011-07-31 (×4): qty 100

## 2011-07-31 MED ORDER — CEFAZOLIN SODIUM 1-5 GM-% IV SOLN
INTRAVENOUS | Status: AC
  Filled 2011-07-31: qty 100

## 2011-07-31 MED ORDER — MIDAZOLAM HCL 5 MG/5ML IJ SOLN
INTRAMUSCULAR | Status: DC | PRN
  Administered 2011-07-31: 2 mg via INTRAVENOUS

## 2011-07-31 MED ORDER — HYDROMORPHONE HCL PF 1 MG/ML IJ SOLN
INTRAMUSCULAR | Status: AC
  Filled 2011-07-31: qty 1

## 2011-07-31 MED ORDER — METHOCARBAMOL 500 MG PO TABS
500.0000 mg | ORAL_TABLET | Freq: Four times a day (QID) | ORAL | Status: DC | PRN
  Administered 2011-08-01 – 2011-08-03 (×5): 500 mg via ORAL
  Filled 2011-07-31 (×6): qty 1

## 2011-07-31 MED ORDER — EPHEDRINE SULFATE 50 MG/ML IJ SOLN
INTRAMUSCULAR | Status: DC | PRN
  Administered 2011-07-31 (×4): 5 mg via INTRAVENOUS

## 2011-07-31 MED ORDER — HETASTARCH-ELECTROLYTES 6 % IV SOLN
INTRAVENOUS | Status: DC | PRN
  Administered 2011-07-31: 20:00:00 via INTRAVENOUS

## 2011-07-31 MED ORDER — DEXTROSE 5 % IV SOLN
500.0000 mg | Freq: Four times a day (QID) | INTRAVENOUS | Status: DC | PRN
  Filled 2011-07-31: qty 5

## 2011-07-31 MED ORDER — BISACODYL 10 MG RE SUPP: 10.0000 mg | Freq: Every day | RECTAL | Status: DC | PRN

## 2011-07-31 MED ORDER — PROPOFOL 10 MG/ML IV BOLUS
INTRAVENOUS | Status: DC | PRN
  Administered 2011-07-31 (×2): 20 mg via INTRAVENOUS
  Administered 2011-07-31: 40 mg via INTRAVENOUS

## 2011-07-31 MED ORDER — MEPERIDINE HCL 50 MG/ML IJ SOLN
12.5000 mg | Freq: Once | INTRAMUSCULAR | Status: AC
  Administered 2011-07-31: 12.5 mg via INTRAVENOUS

## 2011-07-31 MED ORDER — SODIUM CHLORIDE 0.9 % IV SOLN: INTRAVENOUS | Status: DC

## 2011-07-31 MED ORDER — LACTATED RINGERS IV SOLN
INTRAVENOUS | Status: DC
Start: 2011-07-31 — End: 2011-07-31
  Administered 2011-07-31 (×2): via INTRAVENOUS
  Administered 2011-07-31: 1000 mL via INTRAVENOUS

## 2011-07-31 MED ORDER — MORPHINE SULFATE 2 MG/ML IJ SOLN
1.0000 mg | INTRAMUSCULAR | Status: DC | PRN
  Administered 2011-07-31 – 2011-08-01 (×2): 2 mg via INTRAVENOUS
  Filled 2011-07-31 (×2): qty 1

## 2011-07-31 MED ORDER — MIDAZOLAM HCL 2 MG/2ML IJ SOLN
INTRAMUSCULAR | Status: AC
  Filled 2011-07-31: qty 2

## 2011-07-31 MED ORDER — ONDANSETRON HCL 4 MG/2ML IJ SOLN
4.0000 mg | Freq: Four times a day (QID) | INTRAMUSCULAR | Status: DC | PRN
  Administered 2011-08-03: 4 mg via INTRAVENOUS
  Filled 2011-07-31: qty 2

## 2011-07-31 MED ORDER — ACETAMINOPHEN 10 MG/ML IV SOLN
1000.0000 mg | Freq: Once | INTRAVENOUS | Status: AC
  Administered 2011-07-31: 1000 mg via INTRAVENOUS
  Filled 2011-07-31: qty 100

## 2011-07-31 MED ORDER — MENTHOL 3 MG MT LOZG
1.0000 | LOZENGE | OROMUCOSAL | Status: DC | PRN
  Filled 2011-07-31: qty 9

## 2011-07-31 MED ORDER — CEFAZOLIN SODIUM-DEXTROSE 2-3 GM-% IV SOLR
2.0000 g | INTRAVENOUS | Status: AC
  Administered 2011-07-31: 1 g via INTRAVENOUS

## 2011-07-31 MED ORDER — LEVOTHYROXINE SODIUM 125 MCG PO TABS
125.0000 ug | ORAL_TABLET | Freq: Every day | ORAL | Status: DC
  Administered 2011-08-01 – 2011-08-03 (×3): 125 ug via ORAL
  Filled 2011-07-31 (×3): qty 1

## 2011-07-31 MED ORDER — METOCLOPRAMIDE HCL 10 MG PO TABS: 5.0000 mg | ORAL_TABLET | Freq: Three times a day (TID) | ORAL | Status: DC | PRN

## 2011-07-31 MED ORDER — METOCLOPRAMIDE HCL 5 MG/ML IJ SOLN: 5.0000 mg | Freq: Three times a day (TID) | INTRAMUSCULAR | Status: DC | PRN

## 2011-07-31 MED ORDER — PHENOL 1.4 % MT LIQD
1.0000 | OROMUCOSAL | Status: DC | PRN
  Filled 2011-07-31: qty 177

## 2011-07-31 MED ORDER — DOCUSATE SODIUM 100 MG PO CAPS
100.0000 mg | ORAL_CAPSULE | Freq: Two times a day (BID) | ORAL | Status: DC
  Administered 2011-07-31 – 2011-08-03 (×6): 100 mg via ORAL
  Filled 2011-07-31 (×7): qty 1

## 2011-07-31 MED ORDER — LACTATED RINGERS IV SOLN
INTRAVENOUS | Status: DC
Start: 2011-07-31 — End: 2011-07-31

## 2011-07-31 MED ORDER — PROPOFOL 10 MG/ML IV EMUL
INTRAVENOUS | Status: DC | PRN
  Administered 2011-07-31 (×2): 100 ug/kg/min via INTRAVENOUS

## 2011-07-31 MED ORDER — TEMAZEPAM 15 MG PO CAPS: 15.0000 mg | ORAL_CAPSULE | Freq: Every evening | ORAL | Status: DC | PRN

## 2011-07-31 MED ORDER — DIPHENHYDRAMINE HCL 12.5 MG/5ML PO ELIX: 12.5000 mg | ORAL_SOLUTION | ORAL | Status: DC | PRN

## 2011-07-31 SURGICAL SUPPLY — 49 items
BAG ZIPLOCK 12X15 (MISCELLANEOUS) ×2 IMPLANT
BIT DRILL 145X3.2XQCK CNCT (BIT) ×1 IMPLANT
BIT DRL 145X3.2XQCK CNCT (BIT) ×1
CLOTH BEACON ORANGE TIMEOUT ST (SAFETY) ×2 IMPLANT
Cable grip system (Cable) ×2 IMPLANT
Cable-ready bone plate (Plate) ×2 IMPLANT
Cable-ready cable grip system (Cable) ×6 IMPLANT
DRAPE INCISE IOBAN 66X45 STRL (DRAPES) ×2 IMPLANT
DRAPE ORTHO SPLIT 77X108 STRL (DRAPES) ×2
DRAPE POUCH INSTRU U-SHP 10X18 (DRAPES) ×2 IMPLANT
DRAPE SURG 17X11 SM STRL (DRAPES) ×2 IMPLANT
DRAPE SURG ORHT 6 SPLT 77X108 (DRAPES) ×2 IMPLANT
DRAPE U-SHAPE 47X51 STRL (DRAPES) ×2 IMPLANT
DRILL BIT QC 3.2X145 (BIT) ×1
DRSG ADAPTIC 3X8 NADH LF (GAUZE/BANDAGES/DRESSINGS) ×2 IMPLANT
DRSG EMULSION OIL 3X16 NADH (GAUZE/BANDAGES/DRESSINGS) ×2 IMPLANT
DRSG MEPILEX BORDER 4X12 (GAUZE/BANDAGES/DRESSINGS) ×2 IMPLANT
DRSG PAD ABDOMINAL 8X10 ST (GAUZE/BANDAGES/DRESSINGS) ×4 IMPLANT
DURAPREP 26ML APPLICATOR (WOUND CARE) ×2 IMPLANT
ELECT REM PT RETURN 9FT ADLT (ELECTROSURGICAL) ×2
ELECTRODE REM PT RTRN 9FT ADLT (ELECTROSURGICAL) ×1 IMPLANT
EVACUATOR 1/8 PVC DRAIN (DRAIN) IMPLANT
FACESHIELD LNG OPTICON STERILE (SAFETY) ×8 IMPLANT
GLOVE BIO SURGEON STRL SZ7.5 (GLOVE) ×2 IMPLANT
GLOVE BIO SURGEON STRL SZ8 (GLOVE) ×2 IMPLANT
GLOVE ORTHO TXT STRL SZ7.5 (GLOVE) ×2 IMPLANT
GOWN STRL NON-REIN LRG LVL3 (GOWN DISPOSABLE) ×2 IMPLANT
GOWN STRL REIN XL XLG (GOWN DISPOSABLE) ×2 IMPLANT
IMMOBILIZER KNEE 20 (SOFTGOODS) ×2
IMMOBILIZER KNEE 20 THIGH 36 (SOFTGOODS) ×1 IMPLANT
KIT BASIN OR (CUSTOM PROCEDURE TRAY) ×2 IMPLANT
MANIFOLD NEPTUNE II (INSTRUMENTS) ×2 IMPLANT
NS IRRIG 1000ML POUR BTL (IV SOLUTION) ×4 IMPLANT
PACK TOTAL JOINT (CUSTOM PROCEDURE TRAY) ×2 IMPLANT
POSITIONER SURGICAL ARM (MISCELLANEOUS) ×2 IMPLANT
SCREW CORTICAL 4.5X140MM (Screw) ×4 IMPLANT
SPONGE GAUZE 4X4 12PLY (GAUZE/BANDAGES/DRESSINGS) ×2 IMPLANT
SPONGE LAP 18X18 X RAY DECT (DISPOSABLE) ×2 IMPLANT
SPONGE LAP 4X18 X RAY DECT (DISPOSABLE) ×2 IMPLANT
STAPLER VISISTAT 35W (STAPLE) IMPLANT
STRIP CLOSURE SKIN 1/2X4 (GAUZE/BANDAGES/DRESSINGS) IMPLANT
SUT MNCRL AB 4-0 PS2 18 (SUTURE) IMPLANT
SUT VIC AB 1 CT1 27 (SUTURE) ×3
SUT VIC AB 1 CT1 27XBRD ANTBC (SUTURE) ×3 IMPLANT
SUT VIC AB 2-0 CT1 27 (SUTURE) ×3
SUT VIC AB 2-0 CT1 TAPERPNT 27 (SUTURE) ×3 IMPLANT
TOWEL OR 17X26 10 PK STRL BLUE (TOWEL DISPOSABLE) ×4 IMPLANT
TRAY FOLEY CATH 14FRSI W/METER (CATHETERS) IMPLANT
WATER STERILE IRR 1500ML POUR (IV SOLUTION) ×2 IMPLANT

## 2011-07-31 NOTE — Anesthesia Procedure Notes (Signed)
Spinal  Patient location during procedure: OR Start time: 07/31/2011 7:05 PM End time: 07/31/2011 7:15 PM Staffing Anesthesiologist: Ronelle Nigh L Performed by: anesthesiologist  Preanesthetic Checklist Completed: patient identified, site marked, surgical consent, pre-op evaluation, timeout performed, IV checked, risks and benefits discussed and monitors and equipment checked Spinal Block Patient position: right lateral decubitus Prep: Betadine Patient monitoring: heart rate, continuous pulse ox and blood pressure Location: L3-4 Injection technique: single-shot Needle Needle type: Spinocan  Needle gauge: 22 G Needle length: 9 cm Assessment Sensory level: T6 Additional Notes Expiration date of kit checked and confirmed. Patient tolerated procedure well, without complications.

## 2011-07-31 NOTE — Progress Notes (Signed)
No further shaking noted

## 2011-07-31 NOTE — Anesthesia Postprocedure Evaluation (Addendum)
  Anesthesia Post-op Note  Patient: [name]  Procedure(s) Performed: Procedure(s) (LRB): OPEN REDUCTION INTERNAL FIXATION (ORIF) DISTAL FEMUR FRACTURE (Right)  Patient Location: PACU  Anesthesia Type: spinal  Level of Consciousness: awake and alert   Airway and Oxygen Therapy: Patient Spontanous Breathing  Post-op Pain: mild  Post-op Assessment: Post-op Vital signs reviewed, Patient's Cardiovascular Status Stable, Respiratory Function Stable, Patent Airway and No signs of Nausea or vomiting  Post-op Vital Signs: stable  Complications: No apparent anesthesia complications

## 2011-07-31 NOTE — Transfer of Care (Signed)
Immediate Anesthesia Transfer of Care Note  Patient: [name]  Procedure(s) Performed: Procedure(s) (LRB): OPEN REDUCTION INTERNAL FIXATION (ORIF) DISTAL FEMUR FRACTURE (Right)  Patient Location: PACU  Anesthesia Type: Regional  Level of Consciousness: sedated  Airway & Oxygen Therapy: Patient Spontanous Breathing and Patient connected to face mask oxygen  Post-op Assessment: Report given to PACU RN and Post -op Vital signs reviewed and stable  Post vital signs: Reviewed and stable  Complications: No apparent anesthesia complications

## 2011-07-31 NOTE — Anesthesia Preprocedure Evaluation (Addendum)
Anesthesia Evaluation  Patient identified by MRN, date of birth, ID band Patient awake    Reviewed: Allergy & Precautions, H&P , NPO status , Patient's Chart, lab work & pertinent test results  Airway Mallampati: II TM Distance: >3 FB Neck ROM: full    Dental No notable dental hx.    Pulmonary neg pulmonary ROS,  breath sounds clear to auscultation  Pulmonary exam normal       Cardiovascular Exercise Tolerance: Good negative cardio ROS  Rhythm:regular Rate:Normal     Neuro/Psych negative neurological ROS  negative psych ROS   GI/Hepatic negative GI ROS, Neg liver ROS,   Endo/Other  negative endocrine ROSHypothyroidism   Renal/GU negative Renal ROS  negative genitourinary   Musculoskeletal   Abdominal   Peds  Hematology negative hematology ROS (+)   Anesthesia Other Findings   Reproductive/Obstetrics negative OB ROS                           Anesthesia Physical Anesthesia Plan  ASA: II  Anesthesia Plan: Spinal   Post-op Pain Management:    Induction:   Airway Management Planned: Simple Face Mask  Additional Equipment:   Intra-op Plan:   Post-operative Plan: Extubation in OR  Informed Consent: I have reviewed the patients History and Physical, chart, labs and discussed the procedure including the risks, benefits and alternatives for the proposed anesthesia with the patient or authorized representative who has indicated his/her understanding and acceptance.   Dental Advisory Given  Plan Discussed with: CRNA and Surgeon  Anesthesia Plan Comments:        Anesthesia Quick Evaluation

## 2011-07-31 NOTE — Progress Notes (Signed)
Demerol given for shaking  

## 2011-07-31 NOTE — Interval H&P Note (Signed)
History and Physical Interval Note:  07/31/2011 6:15 PM  [name]  has presented today for surgery, with the diagnosis of right femur peri prothetic fx   The various methods of treatment have been discussed with the patient and family. After consideration of risks, benefits and other options for treatment, the patient has consented to  Procedure(s) (LRB): OPEN REDUCTION INTERNAL FIXATION (ORIF) DISTAL FEMUR FRACTURE (Right) as a surgical intervention .  The patients' history has been reviewed, patient examined, no change in status, stable for surgery.  I have reviewed the patients' chart and labs.  Questions were answered to the patient's satisfaction.     Loanne Drilling

## 2011-07-31 NOTE — Preoperative (Signed)
Beta Blockers   Reason not to administer Beta Blockers:Not Applicable 

## 2011-07-31 NOTE — Brief Op Note (Signed)
07/31/2011  8:27 PM  PATIENT:  [name]  78 y.o. female  PRE-OPERATIVE DIAGNOSIS:  right femur peri prosthetic fx   POST-OPERATIVE DIAGNOSIS:  right femur peri prosthetic fx   PROCEDURE:  Procedure(s) (LRB): OPEN REDUCTION INTERNAL FIXATION (ORIF) DISTAL FEMUR FRACTURE (Right)  SURGEON:  Surgeon(s) and Role:    * Loanne Drilling, MD - Primary  PHYSICIAN ASSISTANT:   ASSISTANTS: Avel Peace, PA-C   ANESTHESIA:   spinal  EBL:  Total I/O In: 2500 [I.V.:2000; IV Piggyback:500] Out: 950 [Urine:850; Blood:100]  BLOOD ADMINISTERED:none  DRAINS: (Medium) Hemovact drain(s) in the right thigh with  Suction Open   LOCAL MEDICATIONS USED:  NONE  COUNTS:  YES  TOURNIQUET:  * No tourniquets in log *  DICTATION: .Other Dictation: Dictation Number [id number]  PLAN OF CARE: Admit to inpatient   PATIENT DISPOSITION:  PACU - hemodynamically stable.  Gus Rankin Cordie Beazley, MD    07/31/2011, 8:32 PM

## 2011-07-31 NOTE — H&P (Signed)
[name] is an 78 y.o. female.    Date of Admission:  07/31/2011  Chief Complaint: Right Thigh Pain  HPI: Patient is a 78 year old female well known to Dr. Lequita Halt who has undergone bilateral total hips in the past (Right THA - 04/2000, Left THA - 06/2001). She has been seen in the office for ongoing thigh pain.  She gave a history of multiples falls this over the past year and a half.  Her last fall was back in November 2012 when she "jammed" her right leg.  She underwent some massage therapy but continued to hobble around on a cane.  She was seen over [facility] [address] and was found to have difficulty lifting her thigh.  She has lumbar films taken at that time which were found to be negative.  An appointment was made with Dr. Rodolph Bong in the office where further x-rays were taken and found to show a periprosthetic right femur fracture.  She was placed on protective weight bearing and followed up with Dr. Lequita Halt.  The films were reviewed by Dr. Lequita Halt which already showed a little healing on the medial side of the femur and it appeared to be stable.  She was treated nonoperatively at that point but unfortunately on follow up again, x-rays showed progressive angulation with failure to heal.  It was felt that she would best be served by undergoing ORIF of the Right Periprosthetic Femur Fracture.  She is admitted for surgery.  Past Medical History  Diagnosis Date  . Hypothyroidism 3-21- 13    tx. levothyroxine  . Blood transfusion 07-27-11    post surgery some time ago  . Arthritis 07-27-11    osteoarthritis-hips/s/p bil. THA  . Cancer 07-27-11    Breast cancer -s/p lt. mastectomy, some squamous cell lesions    Past Surgical History  Procedure Date  . Abdominal hysterectomy   . Ankle surgery 07-27-11    right ankle tendon repair  . Mastectomy 07-27-11    left  . Cataract extraction, bilateral 07-27-11    Bilateral  . Squamous cell carcinoma excision 07-27-11    x2 left shin  .  Joint replacement 07-27-11    Bilateral: Right THA - 04/2000, Left THA 06/2001  . Breast surgery 07-27-11    lt.breast cancer intraductal cancer, mastectomy    Family History  Problem Relation Age of Onset  . Cancer Mother   . Cancer Father    Social History:  reports that she quit smoking about 59 years ago. She does not have any smokeless tobacco history on file. She reports that she drinks about .6 ounces of alcohol per week. She reports that she does not use illicit drugs.  Allergies:  Allergies  Allergen Reactions  . Epinephrine (Epipen) Palpitations   Medications:  Patient is to bring in her medications to be added to the chart.   Review of Systems  Constitutional: Negative.   HENT: Negative.   Respiratory: Negative.   Cardiovascular: Negative.   Gastrointestinal: Negative.   Genitourinary: Negative.   Musculoskeletal:       Positive for right thigh pain.   VITALS: Temp 97.0 F Pulse 99 Resp 18 BP 204/91  Physical Exam  Constitutional: She appears well-developed and well-nourished.  HENT:  Head: Normocephalic and atraumatic.  Eyes: EOM are normal. Pupils are equal, round, and reactive to light.  Neck: Normal range of motion. Neck supple.  Cardiovascular: Normal rate and regular rhythm.   Respiratory: Breath sounds normal.  GI: Soft. Bowel sounds are normal.  Musculoskeletal:       Right Hip and Leg - hip flexion causes considerable pain. Passive flexion to 90 degrees without pain but pain beyond this point. Pain with internal and external rotation. No obvious deformity is noted.  Pulses are intact. Sensation intact,     RADIOGRAPHS:  Xrays taken in the office show a Type 2 periprosthetic femur fracture with a stable-appearing femoral stem.  There is some healing and callous formation on the medial side of the fracture.  Lateral fracture line is without healing.  Assessment/Plan Right Periprosthetic Femur Fracture  Plan is to undergo an ORIF of the right  periprosthetic femur fracture by Dr. Lequita Halt.  Charizma Gardiner 07/31/2011, 4:20 PM

## 2011-08-01 LAB — CBC
HCT: 33.8 % — ABNORMAL LOW (ref 36.0–46.0)
MCHC: 32.2 g/dL (ref 30.0–36.0)
MCV: 87.3 fL (ref 78.0–100.0)
Platelets: 177 10*3/uL (ref 150–400)
RDW: 13.5 % (ref 11.5–15.5)
WBC: 11.5 10*3/uL — ABNORMAL HIGH (ref 4.0–10.5)

## 2011-08-01 LAB — BASIC METABOLIC PANEL
BUN: 10 mg/dL (ref 6–23)
Calcium: 8.3 mg/dL — ABNORMAL LOW (ref 8.4–10.5)
Creatinine, Ser: 0.58 mg/dL (ref 0.50–1.10)
GFR calc Af Amer: >90 mL/min (ref >90)
GFR calc non Af Amer: 86 mL/min — ABNORMAL LOW (ref >90)

## 2011-08-01 NOTE — Progress Notes (Signed)
Physical Therapy Treatment Patient Details Name: [name] MRN: [id number] DOB: 15-Aug-1931 Today's Date: 08/01/2011  R ORIF 2nd distal THR fx POD #1 PWB 15:35 - 16:00 1 ta  1 gt  PT Assessment/Plan  PT - Assessment/Plan Comments on Treatment Session: Pt requested to amb again with PT stated nursing. Assisted pt OOB and amb 15" before pt c/o max dizzyness, appeared grey in color, knees buckled and chair brought to place opt in recovery position.  Pt BP/RA within normal but HR was 61.  Returned to room with RN. PT Plan: Discharge plan remains appropriate PT Goals  Acute Rehab PT Goals PT Goal Formulation: With patient Pt will go Supine/Side to Sit: with modified independence PT Goal: Supine/Side to Sit - Progress: Progressing toward goal Pt will go Sit to Stand: with modified independence PT Goal: Sit to Stand - Progress: Progressing toward goal Pt will Ambulate: 51 - 150 feet;with modified independence;with rolling walker PT Goal: Ambulate - Progress: Progressing toward goal  PT Treatment Precautions/Restrictions  Precautions Precautions: Posterior Hip (per orders) Precaution Booklet Issued: Yes (comment) Precaution Comments: sign hung in room Required Braces or Orthoses: No Restrictions Weight Bearing Restrictions: Yes RLE Weight Bearing: Partial weight bearing RLE Partial Weight Bearing Percentage or Pounds: 25-50 Mobility (including Balance) Bed Mobility Bed Mobility: Yes (assisted pt OOB) Supine to Sit: 4: Min assist Supine to Sit Details (indicate cue type and reason): increased time and one VC on proper tech Transfers Transfers: Yes Sit to Stand: 4: Min assist;From bed Sit to Stand Details (indicate cue type and reason): good use of hands and safety cognition Stand to Sit: 2: Max assist;To chair/3-in-1 Stand to Sit Details: Pt demon mild syncope episode with c/o dizzyness, appeared grey in color and found to have a HR of 61 Ambulation/Gait Ambulation/Gait:  Yes Ambulation/Gait Assistance: 4: Min assist Ambulation/Gait Assistance Details (indicate cue type and reason): Pt requested to amb per nursing, but after 15' c/o dizzyness, appeared grey in color, knees buckled and chair brought to pt and placed in recovery position. Ambulation Distance (Feet): 15 Feet Assistive device: Rolling walker Gait Pattern: Step-to pattern Gait velocity: Pt doing well with PWB and c/o "soreness" vs pain Stairs: No Wheelchair Mobility Wheelchair Mobility: No    Exercise    End of Session PT - End of Session Equipment Utilized During Treatment: Gait belt Activity Tolerance: Treatment limited secondary to medical complications (Comment) (mild syncope episode) Patient left: in chair Nurse Communication: Other (comment) (mild syncope episode) General Behavior During Session: [facility] for tasks performed Cognition: [facility] for tasks performed

## 2011-08-01 NOTE — Progress Notes (Signed)
MILD SYNCOPE EPISODE Pt requested to amb again this afternoon, stated she felt better after eating.  Assisted pt OOB and amb in hallway but after 15' pt c/o max dizzyness, started to appear grey in color and buckled at the knees.  Pt placed in recovery position in recliner and found to have a HR of 61. RN notified. Pt returned to her room stating, "I'm fine".  Felecia Shelling  PTA [facility]  Acute  Rehab Pager     [phone]

## 2011-08-01 NOTE — Progress Notes (Signed)
OT Screen Order received, chart reviewed. Spoke briefly with pt who states she has all necessary DME and AE from previous hip sxs and will have prn A upon return home. Pt presents with no OT needs at this time. Will sign off.  Garrel Ridgel, OTR/L  Pager [phone] 08/01/2011

## 2011-08-01 NOTE — Evaluation (Signed)
Physical Therapy Evaluation Patient Details Name: [name] MRN: [id number] DOB: 1932-04-14 Today's Date: 08/01/2011  Problem List:  Patient Active Problem List  Diagnoses  . Peri-prosthetic fracture of femur following total hip arthroplasty    Past Medical History:  Past Medical History  Diagnosis Date  . Hypothyroidism 3-21- 13    tx. levothyroxine  . Blood transfusion 07-27-11    post surgery some time ago  . Arthritis 07-27-11    osteoarthritis-hips/s/p bil. THA  . Cancer 07-27-11    Breast cancer -s/p lt. mastectomy, some squamous cell lesions   Past Surgical History:  Past Surgical History  Procedure Date  . Abdominal hysterectomy   . Ankle surgery 07-27-11    right ankle tendon repair  . Mastectomy 07-27-11    left  . Cataract extraction, bilateral 07-27-11    Bilateral  . Squamous cell carcinoma excision 07-27-11    x2 left shin  . Joint replacement 07-27-11    Bilateral: Right THA - 04/2000, Left THA 06/2001  . Breast surgery 07-27-11    lt.breast cancer intraductal cancer, mastectomy    PT Assessment/Plan/Recommendation PT Assessment Clinical Impression Statement: 78 y.o. female admitted for ORIF of R distal femur. Pt presents with acute pain, decreased strength in RLE, decreased activity tolerance.  Plan is to DC home, excellent progress expected. Pt would benefit from acute PT to maximize safety and independence with mobility. PT Recommendation/Assessment: Patient will need skilled PT in the acute care venue PT Problem List: Decreased strength;Decreased activity tolerance;Decreased mobility;Decreased knowledge of precautions;Pain Barriers to Discharge: None PT Therapy Diagnosis : Acute pain;Difficulty walking PT Plan PT Frequency: 7X/week PT Treatment/Interventions: Gait training;Stair training;Functional mobility training;Therapeutic activities;Therapeutic exercise;Patient/family education PT Recommendation Recommendations for Other Services: OT  consult Follow Up Recommendations: Home health PT Equipment Recommended: None recommended by PT PT Goals  Acute Rehab PT Goals PT Goal Formulation: With patient Time For Goal Achievement: 4 days Pt will go Supine/Side to Sit: with modified independence;with HOB 0 degrees PT Goal: Supine/Side to Sit - Progress: Goal set today Pt will go Sit to Stand: with modified independence;with upper extremity assist PT Goal: Sit to Stand - Progress: Goal set today Pt will Ambulate: 51 - 150 feet;with modified independence;with rolling walker PT Goal: Ambulate - Progress: Goal set today Pt will Go Up / Down Stairs: 1-2 stairs;with rolling walker;with supervision PT Goal: Up/Down Stairs - Progress: Goal set today Pt will Perform Home Exercise Program: Independently PT Goal: Perform Home Exercise Program - Progress: Goal set today  PT Evaluation Precautions/Restrictions  Precautions Precautions: Posterior Hip (per orders) Precaution Booklet Issued: Yes (comment) Precaution Comments: sign hung in room Required Braces or Orthoses: No Restrictions Weight Bearing Restrictions: Yes RLE Weight Bearing: Partial weight bearing RLE Partial Weight Bearing Percentage or Pounds: 25-50 Prior Functioning  Home Living Lives With: Alone Receives Help From: Family;Friend(s) Type of Home: House Home Layout: One level (one interior step) Home Access: Level entry Home Adaptive Equipment: Walker - rolling;Shower chair with back;Bedside commode/3-in-1;Long-handled shoehorn;Reacher Prior Function Level of Independence: Requires assistive device for independence;Independent with basic ADLs;Independent with transfers (had to use walker since December 2012, very active prior) Able to Take Stairs?: Yes Cognition Cognition Arousal/Alertness: Awake/alert Overall Cognitive Status: Appears within functional limits for tasks assessed Orientation Level: Oriented X4 Sensation/Coordination Sensation Light Touch: Appears  Intact Coordination Gross Motor Movements are Fluid and Coordinated: Yes Fine Motor Movements are Fluid and Coordinated: Yes Extremity Assessment RUE Assessment RUE Assessment: Within Functional Limits LUE Assessment LUE Assessment:  Within Functional Limits RLE Assessment RLE Assessment: Exceptions to [facility] RLE Strength RLE Overall Strength: Deficits (ankle WNL, hip 2/5, knee 3/5) LLE Assessment LLE Assessment: Within Functional Limits Mobility (including Balance) Bed Mobility Bed Mobility: Yes Supine to Sit: 4: Min assist;HOB flat Supine to Sit Details (indicate cue type and reason): pt 75%, assist to support RLE Transfers Transfers: Yes Sit to Stand: 4: Min assist;From bed;With upper extremity assist Sit to Stand Details (indicate cue type and reason): pt 85% Stand to Sit: 4: Min assist;With armrests;To elevated surface;To chair/3-in-1;With upper extremity assist Stand to Sit Details: VCs hand placement Ambulation/Gait Ambulation/Gait: Yes Ambulation/Gait Assistance: 5: Supervision;4: Min assist Ambulation/Gait Assistance Details (indicate cue type and reason): min/guard assist due to lightheadedness Ambulation Distance (Feet): 5 Feet Assistive device: Rolling walker Gait Pattern: Step-to pattern    Exercise    End of Session PT - End of Session Equipment Utilized During Treatment: Gait belt Activity Tolerance: Patient limited by pain (limited by lightheadedness) Patient left: in chair;with call bell in reach Nurse Communication: Mobility status for transfers;Mobility status for ambulation General Behavior During Session: [facility] for tasks performed Cognition: [facility] for tasks performed  Tamala Ser 08/01/2011, 10:06 AM [phone]

## 2011-08-01 NOTE — Progress Notes (Signed)
Resident was noted dizzy while ambulating with therapy. Vital signs noted at 138/66,61,14 and O2 sat of 95% on RA. Fluids encouraged and continued to be in reclined position. Vital signs taken 10 minutes later at 1610 noted at 138/70,65,16 and O2 sat of 99% on room air.

## 2011-08-01 NOTE — Progress Notes (Signed)
Subjective: 1 Day Post-Op Procedure(s) (LRB): OPEN REDUCTION INTERNAL FIXATION (ORIF) DISTAL FEMUR FRACTURE (Right) Patient reports pain as mild.   Patient seen in rounds with Dr. Lequita Halt. Patient doing well so far this morning. We will start therapy today. Plan is to go home with family after hospital stay.  Objective: Vital signs in last 24 hours: Temp:  [96.7 F (35.9 C)-98.4 F (36.9 C)] 97.8 F (36.6 C) (03/26 0745) Pulse Rate:  [52-99] 61  (03/26 0745) Resp:  [8-19] 16  (03/26 0745) BP: (161-204)/(70-103) 163/75 mmHg (03/26 0745) SpO2:  [97 %-100 %] 98 % (03/26 0745) Weight:  [58.968 kg (130 lb)] 58.968 kg (130 lb) (03/25 2320)  Intake/Output from previous day:  Intake/Output Summary (Last 24 hours) at 08/01/11 0901 Last data filed at 08/01/11 0618  Gross per 24 hour  Intake 5013.75 ml  Output   3890 ml  Net 1123.75 ml    Intake/Output this shift:    Labs:  Basename 08/01/11 0410  HGB 10.9*    Basename 08/01/11 0410  WBC 11.5*  RBC 3.87  HCT 33.8*  PLT 177    Basename 08/01/11 0410  NA 135  K 3.7  CL 102  CO2 27  BUN 10  CREATININE 0.58  GLUCOSE 109*  CALCIUM 8.3*   No results found for this basename: LABPT:2,INR:2 in the last 72 hours  Exam - Neurovascular intact Sensation intact distally Intact pulses distally Dressing - clean, dry, no drainage Motor function intact - moving foot and toes well on exam.  Hemovac pulled without difficulty.  Past Medical History  Diagnosis Date  . Hypothyroidism 3-21- 13    tx. levothyroxine  . Blood transfusion 07-27-11    post surgery some time ago  . Arthritis 07-27-11    osteoarthritis-hips/s/p bil. THA  . Cancer 07-27-11    Breast cancer -s/p lt. mastectomy, some squamous cell lesions    Assessment/Plan: 1 Day Post-Op Procedure(s) (LRB): OPEN REDUCTION INTERNAL FIXATION (ORIF) DISTAL FEMUR FRACTURE (Right) Principal Problem:  *Peri-prosthetic fracture of femur following total hip  arthroplasty   Advance diet Up with therapy Discharge home with home health when met goals  DVT Prophylaxis - Xarelto  Protocol Partial Weight-Bearing 25-50% to right leg No vaccines. D/C O2 and try on Room [address]  Patrica Duel 08/01/2011, 9:01 AM

## 2011-08-02 LAB — CBC
HCT: 26.8 % — ABNORMAL LOW (ref 36.0–46.0)
MCHC: 33.6 g/dL (ref 30.0–36.0)
MCV: 86.2 fL (ref 78.0–100.0)
Platelets: 171 10*3/uL (ref 150–400)
RBC: 3.11 MIL/uL — ABNORMAL LOW (ref 3.87–5.11)
RDW: 13.5 % (ref 11.5–15.5)
WBC: 9 10*3/uL (ref 4.0–10.5)

## 2011-08-02 LAB — BASIC METABOLIC PANEL
BUN: 12 mg/dL (ref 6–23)
Chloride: 99 mEq/L (ref 96–112)
GFR calc Af Amer: >90 mL/min (ref >90)
GFR calc non Af Amer: 83 mL/min — ABNORMAL LOW (ref >90)
Potassium: 4.1 mEq/L (ref 3.5–5.1)
Sodium: 130 mEq/L — ABNORMAL LOW (ref 135–145)

## 2011-08-02 LAB — SAMPLE TO BLOOD BANK

## 2011-08-02 MED ORDER — FUROSEMIDE 10 MG/ML IJ SOLN
10.0000 mg | Freq: Once | INTRAMUSCULAR | Status: AC
  Administered 2011-08-02: 10 mg via INTRAVENOUS
  Filled 2011-08-02: qty 1

## 2011-08-02 MED ORDER — ACETAMINOPHEN 325 MG PO TABS
650.0000 mg | ORAL_TABLET | Freq: Once | ORAL | Status: AC
  Administered 2011-08-02: 650 mg via ORAL
  Filled 2011-08-02: qty 2

## 2011-08-02 MED ORDER — SIMETHICONE 40 MG/0.6ML PO SUSP
40.0000 mg | Freq: Four times a day (QID) | ORAL | Status: DC | PRN
  Administered 2011-08-02 (×2): 40 mg via ORAL
  Filled 2011-08-02 (×2): qty 0.6

## 2011-08-02 NOTE — Progress Notes (Signed)
Subjective: 2 Days Post-Op Procedure(s) (LRB): OPEN REDUCTION INTERNAL FIXATION (ORIF) DISTAL FEMUR FRACTURE (Right) Patient reports pain as mild and moderate.   Patient has complaints of pain in the thigh which is swollen.  She did get some lightheadedness with therapy.  HGB is down from yesterday.  Due to the drop in HGB and the symptoms, will give blood today.  Objective: Vital signs in last 24 hours: Temp:  [98.3 F (36.8 C)-99.6 F (37.6 C)] 99.1 F (37.3 C) (03/27 1400) Pulse Rate:  [66-76] 76  (03/27 1400) Resp:  [16] 16  (03/27 1400) BP: (136-166)/(66-73) 166/73 mmHg (03/27 1400) SpO2:  [4 %-99 %] 4 % (03/27 1400)  Intake/Output from previous day:  Intake/Output Summary (Last 24 hours) at 08/02/11 1528 Last data filed at 08/02/11 1335  Gross per 24 hour  Intake 566.67 ml  Output   1775 ml  Net -1208.33 ml    Intake/Output this shift: Total I/O In: 120 [P.O.:120] Out: 1000 [Urine:1000]  Labs:  [facility] 08/02/11 0415 08/01/11 0410  HGB 9.0* 10.9*    Basename 08/02/11 0415 08/01/11 0410  WBC 9.0 11.5*  RBC 3.11* 3.87  HCT 26.8* 33.8*  PLT 171 177    Basename 08/02/11 0415 08/01/11 0410  NA 130* 135  K 4.1 3.7  CL 99 102  CO2 27 27  BUN 12 10  CREATININE 0.64 0.58  GLUCOSE 109* 109*  CALCIUM 8.2* 8.3*   No results found for this basename: LABPT:2,INR:2 in the last 72 hours  Exam - Neurovascular intact Sensation intact distally Dressing/Incision - clean, dry, healing Motor function intact - moving foot and toes well on exam. Right thigh is swollen.  Past Medical History  Diagnosis Date  . Hypothyroidism 3-21- 13    tx. levothyroxine  . Blood transfusion 07-27-11    post surgery some time ago  . Arthritis 07-27-11    osteoarthritis-hips/s/p bil. THA  . Cancer 07-27-11    Breast cancer -s/p lt. mastectomy, some squamous cell lesions    Assessment/Plan: 2 Days Post-Op Procedure(s) (LRB): OPEN REDUCTION INTERNAL FIXATION (ORIF) DISTAL FEMUR  FRACTURE (Right) Principal Problem:  *Peri-prosthetic fracture of femur following total hip arthroplasty   Advance diet Up with therapy Continue foley due to blood transfusion; will continue until blood transfusion complete  DVT Prophylaxis - Xarelto Protocol Partial-Weight Bearing 25-50% right Leg  PERKINS, ALEXZANDREW 08/02/2011, 3:28 PM

## 2011-08-02 NOTE — Clinical Documentation Improvement (Signed)
     Anemia Blood Loss Clarification  THIS DOCUMENT IS NOT A PERMANENT PART OF THE MEDICAL RECORD  RESPOND TO THE THIS QUERY, FOLLOW THE INSTRUCTIONS BELOW:  1. If needed, update documentation for the patient's encounter via the notes activity.  2. Access this query again and click edit on the In [employer].  3. After updating, or not, click F2 to complete all highlighted (required) fields concerning your review. Select "additional documentation in the medical record" OR "no additional documentation provided".  4. Click Sign note button.  5. The deficiency will fall out of your In Basket *Please let us know if you are not able to complete this workflow by phone or e-mail (listed below).        08/02/11  Dear Dr. Lequita Halt, [name]  In an effort to better capture your patient's severity of illness, reflect appropriate length of stay and utilization of resources, a review of the patient medical record has revealed the following indicators.    Based on your clinical judgment, please clarify and document in a progress note and/or discharge summary the clinical condition associated with the following supporting information:  In responding to this query please exercise your independent judgment.  The fact that a query is asked, does not imply that any particular answer is desired or expected.  Pt admitted with Right Periprosthetic Femur Fracture and ORIF  According to lab pt's H/H= 9.0/26.8 in setting of ORIF   Please clarify based on abnormal H/H whether or not  H/H= 9.0/26.8 can be further specified as one of the diagnoses listed below and document in pn or d/c summary.     Possible Clinical Conditions?   " Expected Acute Blood Loss Anemia  " Acute Blood Loss Anemia  " Acute on chronic blood loss anemia  " Other Condition________________  " Cannot Clinically Determine  Risk Factors: (recent surgery, pre op anemia, EBL in OR)  Supporting Information: Right  Periprosthetic Femur Fracture / ORIF Signs and Symptoms  Abn H/H  Diagnostics: Component     Latest Ref Rng 08/01/2011 08/02/2011  HGB     12.0 - 15.0 g/dL 16.1 (L) 9.0 (L)  HCT     36.0 - 46.0 % 33.8 (L) 26.8 (L)   Treatments: Serial H&H monitoring 0.9 % sodium chloride infusion   Reviewed: additional documentation in the medical record  Thank You,  Enis Slipper RN, BSN, CCDS Clinical Documentation Specialist [facility] HIM Dept Pager: [phone] / E-mail: [email or web address]  Health Information Management [place]

## 2011-08-02 NOTE — Op Note (Signed)
NAMEMATEYA, TORTI NO.:  [id number]  MEDICAL RECORD NO.:  [id number]  LOCATION:  1604                         FACILITY:  [facility]  PHYSICIAN:  Ollen Gross, M.D.    DATE OF BIRTH:  06/07/1931  DATE OF PROCEDURE: DATE OF DISCHARGE:                              OPERATIVE REPORT   PREOPERATIVE DIAGNOSIS:  Right periprosthetic femur fracture.  POSTOPERATIVE DIAGNOSIS:  Right periprosthetic femur fracture.  PROCEDURE:  Open reduction and internal fixation of right periprosthetic femur fracture.  SURGEON:  Ollen Gross, M.D.  ASSISTANT:  Alexzandrew L. Perkins, P.A.C.  ANESTHESIA:  Spinal.  ESTIMATED BLOOD LOSS:  100.  DRAINS:  Hemovac x1.  COMPLICATIONS:  None.  CONDITION:  Stable to recovery.  BRIEF CLINICAL NOTE:  Monroe is a 78 year old female, who had a few falls over the past year, and then started having increasing thigh pain around November 2012.  X-rays showed a nondisplaced unicortical periprosthetic femur fracture.  She had been on protected weightbearing.  __________ healing, so let her put more weight on and unfortunately it is angulated.  She has had increased pain.  We decided to take her to the operating room for open reduction and internal fixation of this fracture to prevent it from bowing more and to allow it to heal in anatomic position.  PROCEDURE IN DETAIL:  After successful administration of spinal anesthetic, the patient was placed in the left lateral decubitus position with the right side up and held with the hip positioner.  Right lower extremity was isolated from her perineum with plastic drapes and prepped and draped in the usual sterile fashion.  An incision was made over the lateral thigh centered at the area of the fracture.  I did a 10- inch incision.  Skin was cut with #10 blade through subcutaneous tissue to the fascia lata, which was incised in line with the skin incision. The fascia and the vastus lateralis were then  incised and the muscle elevated off the posterior intermuscular septum.  Perforators identified and cauterized.  Fracture sites identified by some hypertrophic callus. The callus was excised and showed that the fracture is not healed at this site.  The fibrous tissue at the fracture site was removed.  Some bone graft was then placed into it.  A 6-hole Zimmer plate was then placed in the lateral cortex of the femur.  Cables were passed circumferentially around the femur; 3 proximal to the fracture, and 1 distal to the fracture.  One of the cables proximal to the fracture was then tightened and held in apposition.  I then placed 2 screws in the distal holes distal to the fracture, both 40 mm cortical screws with excellent purchase.  We then sequentially tightened the 3 cables proximal to the fracture site effectively reducing the plate to the femur and effectively anatomical reducing the fracture site eliminating the gap there.  Once they were adequately tightened, then we also tightened the cable distal to the fracture site.  Once they were all tightened, then we crimped them with the screw and then cut the cables. This led to a very effective stable reduction of this fracture.  All minor bleeding was cauterized  and the wound copiously irrigated with saline solution.  The muscle and the vastus lateralis fascia were then reapproximated and closed.  The muscle was tacked back down to the intermuscular septum.  Hemovac drain was then placed, and the fascia lata closed over Hemovac drain with interrupted #1 Vicryl.  Subcu was closed with interrupted 2-0 Vicryl and subcuticular running 4-0 Monocryl.  Incisions cleaned and dried and Steri-Strips and a bulky sterile dressing applied.  She was then placed into a knee immobilizer, awakened, and transported to recovery in stable condition.     Ollen Gross, M.D.     FA/MEDQ  D:  07/31/2011  T:  08/02/2011  Job:  [id number]

## 2011-08-02 NOTE — Progress Notes (Signed)
Physical Therapy Treatment Patient Details Name: [name] MRN: [id number] DOB: [birth year] Today's Date: 08/02/2011  PT Assessment/Plan  PT - Assessment/Plan Comments on Treatment Session: pt to get blood this pm, although less symptomatic wtih amb, pt reports feeling stif and sore versus lots of pain PT Plan: Discharge plan remains appropriate PT Frequency: 7X/week Follow Up Recommendations: Home health PT Equipment Recommended: Rolling walker with 5" wheels PT Goals  Acute Rehab PT Goals Pt will go Supine/Side to Sit: with modified independence PT Goal: Supine/Side to Sit - Progress: Progressing toward goal Pt will go Sit to Stand: with modified independence PT Goal: Sit to Stand - Progress: Progressing toward goal Pt will Ambulate: 51 - 150 feet;with modified independence;with rolling walker PT Goal: Ambulate - Progress: Progressing toward goal  PT Treatment Precautions/Restrictions  Precautions Precautions: Posterior Hip Precaution Booklet Issued: Yes (comment) Precaution Comments: sign hung in room Required Braces or Orthoses: No Restrictions Weight Bearing Restrictions: Yes RLE Weight Bearing: Partial weight bearing RLE Partial Weight Bearing Percentage or Pounds: 25-50% Mobility (including Balance) Bed Mobility Supine to Sit: 4: Min assist Supine to Sit Details (indicate cue type and reason): cues for scooting, THP Transfers Sit to Stand: 4: Min assist;From bed Sit to Stand Details (indicate cue type and reason): cues for hand placement and THP, right LE position Stand to Sit: 4: Min assist;To bed;With upper extremity assist Stand to Sit Details: same as above Ambulation/Gait Ambulation/Gait Assistance: 4: Min assist Ambulation/Gait Assistance Details (indicate cue type and reason): cues for sequence and getting heel down on right, improved with distance Ambulation Distance (Feet): 80 Feet Assistive device: Rolling walker Gait Pattern: Step-to pattern      Exercise  Total Joint Exercises Ankle Circles/Pumps: AROM;20 reps;Supine Quad Sets: AROM;Both;10 reps;Supine Heel Slides: AAROM;Right;10 reps;Supine End of Session PT - End of Session Equipment Utilized During Treatment: Gait belt Activity Tolerance: Patient tolerated treatment well Patient left: in bed;with call bell in reach Nurse Communication: Mobility status for ambulation General Behavior During Session: [facility] for tasks performed Cognition: [facility] for tasks performed  [facility] 08/02/2011, 5:06 PM

## 2011-08-02 NOTE — Progress Notes (Signed)
Physical Therapy Treatment Patient Details Name: [name] MRN: [id number] DOB: Nov 07, 1931 Today's Date: 08/02/2011  PT Assessment/Plan  PT - Assessment/Plan Comments on Treatment Session: pt asking about PT returning after she "digests" her lunch PT Plan: Discharge plan remains appropriate PT Frequency: 7X/week Follow Up Recommendations: Home health PT Equipment Recommended: Rolling walker with 5" wheels PT Goals  Acute Rehab PT Goals Pt will go Supine/Side to Sit: with modified independence PT Goal: Supine/Side to Sit - Progress: Progressing toward goal Pt will go Sit to Stand: with modified independence PT Goal: Sit to Stand - Progress: Progressing toward goal Pt will Ambulate: 51 - 150 feet;with modified independence;with rolling walker PT Goal: Ambulate - Progress: Progressing toward goal Pt will Perform Home Exercise Program: Independently PT Goal: Perform Home Exercise Program - Progress: Progressing toward goal  PT Treatment Precautions/Restrictions  Precautions Precautions: Posterior Hip Precaution Booklet Issued: Yes (comment) Precaution Comments: sign hung in room Required Braces or Orthoses: No Restrictions Weight Bearing Restrictions: Yes RLE Weight Bearing: Partial weight bearing RLE Partial Weight Bearing Percentage or Pounds: 25-50 Mobility (including Balance) Bed Mobility Supine to Sit: 4: Min assist Supine to Sit Details (indicate cue type and reason): cues for scooting, THP Transfers Sit to Stand: 4: Min assist;From bed Sit to Stand Details (indicate cue type and reason): pt mildly impulsive, standing without RW due to unable to tol sitting  Stand to Sit: 4: Min assist;3: Mod assist;To chair/3-in-1 Stand to Sit Details: cues or hands Ambulation/Gait Ambulation/Gait Assistance: 4: Min assist Ambulation/Gait Assistance Details (indicate cue type and reason):   pt again with dizziness after amb 30 feet, required chair to sit immediately; HR 51, BP  122/67, sats 97 on RA  Ambulation Distance (Feet): 30 Feet Assistive device: Rolling walker Gait Pattern: Step-to pattern Gait velocity: Pt doing well with PWB and c/o "soreness" vs pain    Exercise   ankle pumpsx20 End of Session PT - End of Session Equipment Utilized During Treatment: Gait belt Activity Tolerance: Treatment limited secondary to medical complications (Comment) Patient left: in chair Nurse Communication: Other (comment) General Behavior During Session: [facility] for tasks performed Cognition: [facility] for tasks performed  [facility] 08/02/2011, 12:14 PM

## 2011-08-02 NOTE — Progress Notes (Signed)
CARE MANAGEMENT NOTE 08/02/2011  Patient:  [name], [name]   Account Number:  [id number]  Date Initiated:  08/02/2011  Documentation initiated by:  Colleen Can  Subjective/Objective Assessment:   DX PERIPROTHETIC FEMUR FRACTURE-RIGHT; ORIF     Action/Plan:   CM SPOKE WITH PATIENT. cURRENT PLANS ARE FOR PATIENT TO RETURN TO HER HOME IN [place] WHERE SHE WILL HAVE ASSISTANCE FROM DAUGHTER AND FRIENDS.   Anticipated DC Date:  08/03/2011   Anticipated DC Plan:  HOME W HOME HEALTH SERVICES  In-house referral  NA      DC Planning Services  CM consult      PAC Choice  DURABLE MEDICAL EQUIPMENT  HOME HEALTH   Choice offered to / List presented to:  C-1 Patient   DME arranged  WALKER - ROLLING        HH arranged  HH-2 PT      [facility] agency  [facility] [place]   Status of service:  In process, will continue to follow  Comments:  08/02/2011 Colleen Can, RN BSN CCM [phone] Pt states she will need RW, but already has commode seat. HH choice offered-wants [facility] for hh services. Genevieve Norlander pre-arranged prior to admission. List of HH agencies placed on shadow chart.

## 2011-08-03 LAB — CBC
HCT: 32.2 % — ABNORMAL LOW (ref 36.0–46.0)
Hemoglobin: 11 g/dL — ABNORMAL LOW (ref 12.0–15.0)
MCH: 29.2 pg (ref 26.0–34.0)
MCV: 86.3 fL (ref 78.0–100.0)
RBC: 3.73 MIL/uL — ABNORMAL LOW (ref 3.87–5.11)

## 2011-08-03 LAB — BASIC METABOLIC PANEL
BUN: 12 mg/dL (ref 6–23)
CO2: 25 mEq/L (ref 19–32)
Calcium: 8.5 mg/dL (ref 8.4–10.5)
Glucose, Bld: 139 mg/dL — ABNORMAL HIGH (ref 70–99)
Sodium: 132 mEq/L — ABNORMAL LOW (ref 135–145)

## 2011-08-03 LAB — TYPE AND SCREEN

## 2011-08-03 MED ORDER — METHOCARBAMOL 500 MG PO TABS: 500.0000 mg | ORAL_TABLET | Freq: Four times a day (QID) | ORAL | Status: AC | PRN

## 2011-08-03 MED ORDER — ACETAMINOPHEN-CODEINE #3 300-30 MG PO TABS: 1.0000 | ORAL_TABLET | ORAL | Status: AC | PRN

## 2011-08-03 MED ORDER — RIVAROXABAN 10 MG PO TABS: 10.0000 mg | ORAL_TABLET | Freq: Every day | ORAL | Status: DC

## 2011-08-03 MED ORDER — ALUM & MAG HYDROXIDE-SIMETH 200-200-20 MG/5ML PO SUSP
30.0000 mL | ORAL | Status: DC | PRN
  Administered 2011-08-03 (×2): 30 mL via ORAL
  Filled 2011-08-03 (×2): qty 30

## 2011-08-03 MED ORDER — ACETAMINOPHEN-CODEINE #3 300-30 MG PO TABS: 1.0000 | ORAL_TABLET | ORAL | Status: DC | PRN

## 2011-08-03 NOTE — Progress Notes (Signed)
Patient's blood pressure elevated.  It appears to have been elevated since admission, PA on call, Ralene Bathe, notified.  Advised to continue checking.  New order received for Maalox.  Will continue to monitor.

## 2011-08-03 NOTE — Progress Notes (Signed)
CARE MANAGEMENT NOTE 08/03/2011  Patient:  [name], [name]   Account Number:  [id number]  Date Initiated:  08/02/2011  Documentation initiated by:  Colleen Can  Subjective/Objective Assessment:   DX PERIPROTHETIC FEMUR FRACTURE-RIGHT; ORIF     Action/Plan:   CM SPOKE WITH PATIENT. cURRENT PLANS ARE FOR PATIENT TO RETURN TO HER HOME IN [place] WHERE SHE WILL HAVE ASSISTANCE FROM DAUGHTER AND FRIENDS.   Anticipated DC Date:  08/03/2011   Anticipated DC Plan:  HOME W HOME HEALTH SERVICES  In-house referral  NA      DC Planning Services  CM consult      PAC Choice  DURABLE MEDICAL EQUIPMENT  HOME HEALTH   Choice offered to / List presented to:  C-1 Patient   DME arranged  Levan Hurst      DME agency  [facility]     HH arranged  HH-2 PT      [facility] agency  [facility]   Status of service:  Completed, signed off Medicare Important Message given?  NA - LOS <3 / Initial given by admissions (If response is "NO", the following Medicare IM given date fields will be blank) Date Medicare IM given:   Date Additional Medicare IM given:    Discharge Disposition:  HOME W HOME HEALTH SERVICES   Comments:  08/03/2011 Raynelle Bring BSN CCM [phone] Plans for discharge today with [facility] services to start tomorrow 08/04/11. RW has been delivered to patient's room.

## 2011-08-03 NOTE — Progress Notes (Signed)
Physical Therapy Treatment Patient Details Name: [name] MRN: [id number] DOB: [birth year] Today's Date: 08/03/2011  PT Assessment/Plan  PT - Assessment/Plan Comments on Treatment Session: pt feeling better today, ready to D/C home;  PT Plan: Discharge plan remains appropriate PT Frequency: 7X/week Follow Up Recommendations: Home health PT Equipment Recommended: Rolling walker with 5" wheels (delivered) PT Goals  Acute Rehab PT Goals Pt will go Sit to Stand: with modified independence PT Goal: Sit to Stand - Progress: Progressing toward goal Pt will Ambulate: 51 - 150 feet;with modified independence;with rolling walker PT Goal: Ambulate - Progress: Progressing toward goal Pt will Go Up / Down Stairs: Other (comment) PT Goal: Up/Down Stairs - Progress: Discontinued (comment) Pt will Perform Home Exercise Program: Independently PT Goal: Perform Home Exercise Program - Progress: Progressing toward goal  PT Treatment Precautions/Restrictions  Precautions Precautions: Posterior Hip Precaution Booklet Issued: Yes (comment) Precaution Comments: sign hung in room Required Braces or Orthoses: No Restrictions Weight Bearing Restrictions: Yes RLE Weight Bearing: Partial weight bearing RLE Partial Weight Bearing Percentage or Pounds: 25-50% Mobility (including Balance) Transfers Sit to Stand: 4: Min assist;5: Supervision Sit to Stand Details (indicate cue type and reason): cues for hand placement and right LE placement Stand to Sit: 4: Min assist;5: Supervision;To chair/3-in-1 Stand to Sit Details: cues for hand placement and THP Ambulation/Gait Ambulation/Gait Assistance: 4: Min assist;5: Supervision Ambulation/Gait Assistance Details (indicate cue type and reason): cues for sequence and getting heel down on right, improved with distance Ambulation Distance (Feet): 120 Feet Assistive device: Rolling walker Gait Pattern: Step-to pattern    Exercise  Total Joint Exercises Ankle  Circles/Pumps: AROM;20 reps;Supine Quad Sets: AROM;Both;10 reps;Supine Short Arc Quad: AAROM;AROM;Right;10 reps Heel Slides: AAROM;Right;10 reps;Supine Hip ABduction/ADduction: AAROM;Right;10 reps End of Session PT - End of Session Equipment Utilized During Treatment: Gait belt Activity Tolerance: Patient tolerated treatment well Patient left: in chair;with call bell in reach General Behavior During Session: [facility] for tasks performed Cognition: [facility] for tasks performed  [facility] 08/03/2011, 1:40 PM

## 2011-08-03 NOTE — Progress Notes (Signed)
Subjective: 3 Days Post-Op Procedure(s) (LRB): OPEN REDUCTION INTERNAL FIXATION (ORIF) DISTAL FEMUR FRACTURE (Right) Patient reports pain as mild.   Patient seen in rounds with Dr. Lequita Halt. Patient is doing better today.  HGB has improved.  Ready to go home today.  Objective: Vital signs in last 24 hours: Temp:  [97.8 F (36.6 C)-99.4 F (37.4 C)] 97.8 F (36.6 C) (03/28 0625) Pulse Rate:  [66-78] 66  (03/28 0625) Resp:  [16-18] 16  (03/28 0625) BP: (163-189)/(73-86) 168/84 mmHg (03/28 0625) SpO2:  [4 %-97 %] 97 % (03/28 0625)  Intake/Output from previous day:  Intake/Output Summary (Last 24 hours) at 08/03/11 1003 Last data filed at 08/03/11 4098  Gross per 24 hour  Intake 2005.83 ml  Output   2950 ml  Net -944.17 ml    Intake/Output this shift:    Labs:  Basename 08/03/11 0438 08/02/11 0415 08/01/11 0410  HGB 11.0* 9.0* 10.9*    Basename 08/03/11 0438 08/02/11 0415  WBC 10.5 9.0  RBC 3.73* 3.11*  HCT 32.2* 26.8*  PLT 159 171    Basename 08/03/11 0438 08/02/11 0415  NA 132* 130*  K 3.9 4.1  CL 100 99  CO2 25 27  BUN 12 12  CREATININE 0.60 0.64  GLUCOSE 139* 109*  CALCIUM 8.5 8.2*   No results found for this basename: LABPT:2,INR:2 in the last 72 hours  Exam: Neurovascular intact Sensation intact distally Incision - clean, dry, healing Motor function intact - moving foot and toes well on exam.   Assessment/Plan: 3 Days Post-Op Procedure(s) (LRB): OPEN REDUCTION INTERNAL FIXATION (ORIF) DISTAL FEMUR FRACTURE (Right) Procedure(s) (LRB): OPEN REDUCTION INTERNAL FIXATION (ORIF) DISTAL FEMUR FRACTURE (Right) Past Medical History  Diagnosis Date  . Hypothyroidism 3-21- 13    tx. levothyroxine  . Blood transfusion 07-27-11    post surgery some time ago  . Arthritis 07-27-11    osteoarthritis-hips/s/p bil. THA  . Cancer 07-27-11    Breast cancer -s/p lt. mastectomy, some squamous cell lesions   Principal Problem:  *Peri-prosthetic fracture of femur  following total hip arthroplasty   Up with therapy Discharge home with home health Diet - regular Follow up - in 2 weeks Activity - PWB Condition Upon Discharge - Good D/C Meds - See DC Summary DVT Prophylaxis - Xarelto Protocol  ,  08/03/2011, 10:03 AM

## 2011-08-03 NOTE — Clinical Documentation Improvement (Signed)
Anemia Blood Loss Clarification  THIS DOCUMENT IS NOT A PERMANENT PART OF THE MEDICAL RECORD  RESPOND TO THE THIS QUERY, FOLLOW THE INSTRUCTIONS BELOW:  1. If needed, update documentation for the patient's encounter via the notes activity.  2. Access this query again and click edit on the In [employer].  3. After updating, or not, click F2 to complete all highlighted (required) fields concerning your review. Select "additional documentation in the medical record" OR "no additional documentation provided".  4. Click Sign note button.  5. The deficiency will fall out of your In Basket *Please let us know if you are not able to complete this workflow by phone or e-mail (listed below).        08/03/11  Dear Minette Brine Marton Redwood (re-issued 08/03/11)  In an effort to better capture your patient's severity of illness, reflect appropriate length of stay and utilization of resources, a review of the patient medical record has revealed the following indicators.    Based on your clinical judgment, please clarify and document in a progress note and/or discharge summary the clinical condition associated with the following supporting information:  In responding to this query please exercise your independent judgment.  The fact that a query is asked, does not imply that any particular answer is desired or expected.  Pt admitted with Right Periprosthetic Femur Fracture and ORIF   According to lab pt's H/H= 9.0/26.8  necessitating blood transfusion  Please clarify based on abnormal H/H whether or not H/H= 9.0/26.8 can be further specified as one of the diagnoses listed below and document in pn or d/c summary.   Possible Clinical Conditions?   " Expected Acute Blood Loss Anemia   " Acute Blood Loss Anemia   " Acute on chronic blood loss anemia   " Other Condition________________  " Cannot Clinically Determine    Risk Factors:   Supporting Information:  Right Periprosthetic Femur  Fracture / ORIF   Signs and Symptoms  08/02/11  HGB is down from yesterday.  Due to the drop in HGB and the symptoms, will give blood today. Abn H/H   Diagnostics:  Component Latest Ref Rng  08/01/2011  08/02/2011   HGB 12.0 - 15.0 g/dL  45.4 (L)  9.0 (L)   HCT 36.0 - 46.0 %  33.8 (L)  26.8 (L)    Treatments:  Serial H&H monitoring  0.9 % sodium chloride infusion   Transfuse RBC ordered 08/02/11   Reviewed:  no additional documentation provided ljh  Thank You,  Enis Slipper  RN, BSN, CCDS Clinical Documentation Specialist [facility] Dept Pager: [phone] / E-mail: [email or web address]  Health Information Management [place]

## 2011-08-03 NOTE — Discharge Instructions (Signed)
Pick up stool softner and laxative for home. Do not submerge incision under water. May shower. Continue to use ice for pain and swelling from surgery.  

## 2011-08-08 ENCOUNTER — Encounter (HOSPITAL_COMMUNITY): Payer: Self-pay | Admitting: Orthopedic Surgery

## 2011-08-09 DIAGNOSIS — D62 Acute posthemorrhagic anemia: Secondary | ICD-10-CM

## 2011-08-09 DIAGNOSIS — Z9289 Personal history of other medical treatment: Secondary | ICD-10-CM

## 2011-08-09 DIAGNOSIS — E871 Hypo-osmolality and hyponatremia: Secondary | ICD-10-CM

## 2011-08-09 NOTE — Discharge Summary (Signed)
Physician Discharge Summary   Patient ID: [name] MRN: [id number] DOB/AGE: [birth year] 78 y.o.  Admit date: 07/31/2011 Discharge date: 08/03/2011  Primary Diagnosis: Right Periprosthetic Femur Fracture  Admission Diagnoses: Past Medical History  Diagnosis Date  . Hypothyroidism 3-21- 13    tx. levothyroxine  . Blood transfusion 07-27-11    post surgery some time ago  . Arthritis 07-27-11    osteoarthritis-hips/s/p bil. THA  . Cancer 07-27-11    Breast cancer -s/p lt. mastectomy, some squamous cell lesions    Discharge Diagnoses:  Principal Problem:  *Peri-prosthetic fracture of femur following total hip arthroplasty Active Problems:  PostopHyponatremia  Postop Acute blood loss anemia  Postop Transfusion   Procedure: Procedure(s) (LRB): OPEN REDUCTION INTERNAL FIXATION (ORIF) DISTAL FEMUR FRACTURE (Right)   Consults: None  HPI: Patient is a 78 year old female well known to Dr. Lequita Halt who has undergone bilateral total hips in the past (Right THA - 04/2000, Left THA - 06/2001). She has been seen in the office for ongoing thigh pain. She gave a history of multiples falls this over the past year and a half. Her last fall was back in November 2012 when she "jammed" her right leg. She underwent some massage therapy but continued to hobble around on a cane. She was seen over at [facility] [address] and was found to have difficulty lifting her thigh. She has lumbar films taken at that time which were found to be negative. An appointment was made with Dr. Rodolph Bong in the office where further x-rays were taken and found to show a periprosthetic right femur fracture. She was placed on protective weight bearing and followed up with Dr. Lequita Halt. The films were reviewed by Dr. Lequita Halt which already showed a little healing on the medial side of the femur and it appeared to be stable. She was treated nonoperatively at that point but unfortunately on follow up again, x-rays showed  progressive angulation with failure to heal. It was felt that she would best be served by undergoing ORIF of the Right Periprosthetic Femur Fracture. She is admitted for surgery.  Laboratory Data: Hospital Outpatient Visit on 07/27/2011  Component Date Value Range Status  . ABO/RH(D)  07/27/2011 O POS   Final   No results found for this basename: HGB:5 in the last 72 hours No results found for this basename: WBC:2,RBC:2,HCT:2,PLT:2 in the last 72 hours No results found for this basename: NA:2,K:2,CL:2,CO2:2,BUN:2,CREATININE:2,GLUCOSE:2,CALCIUM:2 in the last 72 hours No results found for this basename: LABPT:2,INR:2 in the last 72 hours  X-Rays:Dg Chest 2 View  07/27/2011  *RADIOLOGY REPORT*  Clinical Data: Preop for right periprosthetic femoral fracture  CHEST - 2 VIEW  Comparison: None.  Findings: The lungs are clear but slightly hyperaerated.  Mild cardiomegaly is present.  No acute bony abnormality is seen.  IMPRESSION: Mild cardiomegaly.  Slight hyperaeration.  No active lung disease.  Original Report Authenticated By: Juline Patch, M.D.   Dg Hip Complete Right  07/27/2011  *RADIOLOGY REPORT*  Clinical Data: Periprosthetic right femur fracture.  RIGHT HIP - COMPLETE 2+ VIEW  Comparison: None.  Findings: There is a fracture through the proximal right femoral shaft adjacent to the distal tip of the femoral component of the right total hip prosthesis.  There is angulation at the fracture site.  There is bridging callus formation although the fracture line is still visible. There is 19 degrees of angulation at the fracture site.  IMPRESSION: Angulated partially healed fracture of the proximal right  femoral shaft.  Original Report Authenticated By: Gwynn Burly, M.D.   Dg Pelvis Portable  07/31/2011  *RADIOLOGY REPORT*  Clinical Data: Postop right hip.  PORTABLE PELVIS  Comparison: 07/27/2011  Findings: Patient has had ORIF bilateral hips, with revision on the right.  IMPRESSION: No adverse  features following the right hip arthroplasty revision.  Original Report Authenticated By: Patterson Hammersmith, M.D.   Dg Hip Portable 1 View Right  07/31/2011  *RADIOLOGY REPORT*  Clinical Data: Status post ORIF right hip.  PORTABLE RIGHT HIP - 1 VIEW  Comparison: 07/07/2011  Findings: Patient has had screw plate fixation across right proximal femur fracture.  No adverse features identified.  IMPRESSION: Size post ORIF right femur fracture.  Original Report Authenticated By: Patterson Hammersmith, M.D.    EKG: Orders placed during the hospital encounter of 07/31/11  . EKG     Hospital Course: Patient was admitted to [facility] and taken to the OR and underwent the above state procedure without complications.  Patient tolerated the procedure well and was later transferred to the recovery room and then to the orthopaedic floor for postoperative care.  They were given PO and IV analgesics for pain control following their surgery.  They were given 24 hours of postoperative antibiotics and started on DVT prophylaxis.   PT and OT were ordered for total joint protocol.  Discharge planning consulted to help with postop disposition and equipment needs.  Patient had a decent night on the evening of surgery and started to get up with therapy on day one but was only able to walk short distances. Continued to progress slowly with therapy into day two.  Dressing was changed on day two and the incision was healing. Patient had complaints of pain in the thigh which is swollen. She did get some lightheadedness with therapy. HGB is down from yesterday. Due to the drop in HGB and the symptoms, gave blood on day two.  By day three, the patient was feeling better had progressed with therapy and meeting goals.  Incision was healing well.  Patient was seen in rounds and was ready to go home.  Discharge Medications: Prior to Admission medications   Medication Sig Start Date End Date Taking? Authorizing Provider    levothyroxine (SYNTHROID, LEVOTHROID) 125 MCG tablet Take 125 mcg by mouth daily before breakfast.   Yes Historical Provider, MD  acetaminophen-codeine (TYLENOL #3) 300-30 MG per tablet Take 1-2 tablets by mouth every 4 (four) hours as needed. 08/03/11 08/13/11  Deseray Daponte, PA  methocarbamol (ROBAXIN) 500 MG tablet Take 1 tablet (500 mg total) by mouth every 6 (six) hours as needed. 08/03/11 08/13/11  Juston Goheen Julien Girt, PA  rivaroxaban (XARELTO) 10 MG TABS tablet Take 1 tablet (10 mg total) by mouth daily with breakfast. Take for two and a half more weeks and then discontinue. 08/03/11   Takeia Ciaravino Julien Girt, PA    Diet: regular  Activity:PWB No bending hip over 90 degrees- A "L" Angle Do not cross legs Do not let foot roll inward  When turning these patients a pillow should be placed between the patient's legs to prevent crossing.  Patients should have the affected knee fully extended when trying to sit or stand from all surfaces to prevent excessive hip flexion.  When ambulating and turning toward the affected side the affected leg should have the toes turned out prior to moving the walker and the rest of patient's body as to prevent internal rotation/ turning in of the leg.  Abduction pillows are the most effective way to prevent a patient from not crossing legs or turning toes in at rest. If an abduction pillow is not ordered placing a regular pillow length wise between the patient's legs is also an effective reminder.  It is imperative that these precautions be maintained so that the surgical hip does not dislocate.    Follow-up:in 2 weeks  Disposition: Home  Discharged Condition: fair   Discharge Orders    Future Orders Please Complete By Expires   Diet - low sodium heart healthy      Call MD / Call 911      Comments:   If you experience chest pain or shortness of breath, CALL 911 and be transported to the hospital emergency room.  If you develope a fever above 101 F,  pus (white drainage) or increased drainage or redness at the wound, or calf pain, call your surgeon's office.   Constipation Prevention      Comments:   Drink plenty of fluids.  Prune juice may be helpful.  You may use a stool softener, such as Colace (over the counter) 100 mg twice a day.  Use MiraLax (over the counter) for constipation as needed.   Increase activity slowly as tolerated      Weight Bearing as taught in Physical Therapy      Comments:   Use a walker or crutches as instructed.   Discharge instructions      Comments:   Pick up stool softner and laxative for home. Do not submerge incision under water. May shower. Continue to use ice for pain and swelling from surgery.    Driving restrictions      Comments:   No driving   Lifting restrictions      Comments:   No lifting     Medication List  As of 08/09/2011 10:45 PM   STOP taking these medications         acetaminophen 500 MG tablet      aspirin EC 81 MG tablet      CALCIUM + D + K PO      Fish Oil 1200 MG Caps      mulitivitamin with minerals Tabs         TAKE these medications         acetaminophen-codeine 300-30 MG per tablet   Commonly known as: TYLENOL #3   Take 1-2 tablets by mouth every 4 (four) hours as needed.      levothyroxine 125 MCG tablet   Commonly known as: SYNTHROID, LEVOTHROID   Take 125 mcg by mouth daily before breakfast.      methocarbamol 500 MG tablet   Commonly known as: ROBAXIN   Take 1 tablet (500 mg total) by mouth every 6 (six) hours as needed.      rivaroxaban 10 MG Tabs tablet   Commonly known as: XARELTO   Take 1 tablet (10 mg total) by mouth daily with breakfast. Take for two and a half more weeks and then discontinue.           Follow-up Information    Follow up with Loanne Drilling, MD. Schedule an appointment as soon as possible for a visit in 2 weeks.   Contact information:   [facility] [address] [place]  [place] [address] [phone]          Signed: Patrica Petty 08/09/2011, 10:45 PM

## 2011-08-21 ENCOUNTER — Other Ambulatory Visit: Payer: Self-pay | Admitting: Family Medicine

## 2011-08-23 ENCOUNTER — Telehealth: Payer: Self-pay

## 2011-08-23 NOTE — Telephone Encounter (Signed)
Pt would like to see if Dr Hal Hope will call in a refill for a bp medication, she is not able to come in due to surgery that was done in  March, please contact if any questions, I Alcario Drought) repeated what her message was now she saying she cant come in period because of her surgery, she is aware that she needs and ov.

## 2011-08-24 ENCOUNTER — Other Ambulatory Visit: Payer: Self-pay | Admitting: Family Medicine

## 2011-08-24 NOTE — Telephone Encounter (Signed)
Called in Norvasc to [facility] and [place]

## 2011-08-24 NOTE — Telephone Encounter (Signed)
Reviewed paper chart.  Patient seen 05/03/12 with BP after labetalol 190/90(patient was also in pain).  Please ask patient what her more current BP readings are and we then can go from there.

## 2011-08-24 NOTE — Telephone Encounter (Signed)
Dr. Hal Hope   Patient is house bond due to surgery on March 25.  She is in need of her blood pressure medication  Please call [phone]

## 2011-08-24 NOTE — Telephone Encounter (Signed)
OK Norvasc 5 mg daily #30 Please e rx

## 2011-08-24 NOTE — Telephone Encounter (Signed)
Patient states that her BP  Has been running on 08/21/11 133/62 126/69/ 148/76 136/74 138/66 117/67 129/80 138/78 .  These have been the pressures for the last week.

## 2011-08-24 NOTE — Telephone Encounter (Signed)
Please pull paper chart.  

## 2011-08-25 NOTE — Telephone Encounter (Signed)
LMOM that Rx has been called in for new BP med and to CB w/ any ?s

## 2011-09-21 ENCOUNTER — Other Ambulatory Visit: Payer: Self-pay | Admitting: Family Medicine

## 2011-10-16 ENCOUNTER — Emergency Department (HOSPITAL_COMMUNITY)
Admission: EM | Admit: 2011-10-16 | Discharge: 2011-10-16 | Disposition: A | Discharge status: Home / Self Care | Payer: Medicare Other | Attending: Emergency Medicine | Admitting: Emergency Medicine

## 2011-10-16 ENCOUNTER — Encounter (HOSPITAL_COMMUNITY): Payer: Self-pay

## 2011-10-16 DIAGNOSIS — Z87891 Personal history of nicotine dependence: Secondary | ICD-10-CM | POA: Insufficient documentation

## 2011-10-16 DIAGNOSIS — Z96649 Presence of unspecified artificial hip joint: Secondary | ICD-10-CM | POA: Insufficient documentation

## 2011-10-16 DIAGNOSIS — Z853 Personal history of malignant neoplasm of breast: Secondary | ICD-10-CM | POA: Insufficient documentation

## 2011-10-16 DIAGNOSIS — G510 Bell's palsy: Secondary | ICD-10-CM | POA: Insufficient documentation

## 2011-10-16 MED ORDER — PREDNISONE 50 MG PO TABS: ORAL_TABLET | ORAL | Status: AC

## 2011-10-16 MED ORDER — PREDNISONE 20 MG PO TABS
60.0000 mg | ORAL_TABLET | Freq: Once | ORAL | Status: AC
  Administered 2011-10-16: 60 mg via ORAL
  Filled 2011-10-16: qty 3

## 2011-10-16 MED ORDER — TEARS RENEWED OP SOLN: 2.0000 [drp] | Freq: Three times a day (TID) | OPHTHALMIC | Status: DC | PRN

## 2011-10-16 MED ORDER — REFRESH P.M. OP OINT: TOPICAL_OINTMENT | OPHTHALMIC | Status: DC | PRN

## 2011-10-16 NOTE — ED Provider Notes (Signed)
History    This chart was scribed for Joya Gaskins, MD, MD by Smitty Pluck. The patient was seen in room STRE7 and the patient's care was started at 12:23PM.   CSN: [id number]  Arrival date & time 10/16/11  1151   First MD Initiated Contact with Patient 10/16/11 1222      Chief Complaint  Patient presents with  . Facial Droop    The history is provided by the patient.   [name] is a 78 y.o. female who presents to the Emergency Department complaining of left side facial droop without numbness onset 1 day ago. Denies any change in speech, vision changes, hearing changes, chest pain, vomiting, headache, LOC, dizziness and numbness. Denies hx of similar symptoms. Pt reports having stress fracture in femur. Denies hx of MI, diabetes, CVA. No focal arm/leg weakness reported.  No double vision.  No tick bites.   PCP is Dr. Wylene Simmer   Past Medical History  Diagnosis Date  . Hypothyroidism 3-21- 13    tx. levothyroxine  . Blood transfusion 07-27-11    post surgery some time ago  . Arthritis 07-27-11    osteoarthritis-hips/s/p bil. THA  . Cancer 07-27-11    Breast cancer -s/p lt. mastectomy, some squamous cell lesions    Past Surgical History  Procedure Date  . Abdominal hysterectomy   . Ankle surgery 07-27-11    right ankle tendon repair  . Mastectomy 07-27-11    left  . Cataract extraction, bilateral 07-27-11    Bilateral  . Squamous cell carcinoma excision 07-27-11    x2 left shin  . Joint replacement 07-27-11    Bilateral: Right THA - 04/2000, Left THA 06/2001  . Breast surgery 07-27-11    lt.breast cancer intraductal cancer, mastectomy  . Orif femur fracture 07/31/2011    Procedure: OPEN REDUCTION INTERNAL FIXATION (ORIF) DISTAL FEMUR FRACTURE;  Surgeon: Loanne Drilling, MD;  Location: [facility];  Service: Orthopedics;  Laterality: Right;  right femur  (c-arm)     Family History  Problem Relation Age of Onset  . Cancer Mother   . Cancer Father     History  Substance  Use Topics  . Smoking status: Former Smoker -- 10 years    Quit date: 07/26/1952  . Smokeless tobacco: Not on file  . Alcohol Use: 0.6 oz/week    1 Glasses of wine per week     wine daily with supper    OB History    Grav Para Term Preterm Abortions TAB SAB Ect Mult Living                  Review of Systems  Constitutional: Negative for fever and diaphoresis.  Gastrointestinal: Negative for nausea, vomiting and diarrhea.  Neurological: Positive for facial asymmetry. Negative for dizziness, syncope, speech difficulty, light-headedness and numbness.  All other systems reviewed and are negative.    Allergies  Epinephrine  Home Medications   Current Outpatient Rx  Name Route Sig Dispense Refill  . AMLODIPINE BESYLATE 5 MG PO TABS Oral Take 5 mg by mouth daily.    Marland Kitchen CALCIUM-VITAMIN D PO Oral Take 1 tablet by mouth daily.    . OMEGA-3 FATTY ACIDS 1000 MG PO CAPS Oral Take 1 g by mouth daily.    Marland Kitchen LEVOTHYROXINE SODIUM 125 MCG PO TABS Oral Take 125 mcg by mouth daily before breakfast.    . THERA M PLUS PO TABS Oral Take 1 tablet by mouth daily.  BP 186/75  Pulse 71  Temp(Src) 97.8 F (36.6 C) (Oral)  Resp 16  SpO2 100%  Physical Exam  Nursing note and vitals reviewed.  CONSTITUTIONAL: Well developed/well nourished HEAD AND FACE: Normocephalic/atraumatic EYES: EOMI/PERRL, no nystagmus ENMT: Mucous membranes moist NECK: supple no meningeal signs, no bruits  SPINE:entire spine nontender CV: S1/S2 noted, no murmurs/rubs/gallops noted LUNGS: Lungs are clear to auscultation bilaterally, no apparent distress ABDOMEN: soft, nontender, no rebound or guarding GU:no cva tenderness NEURO: Pt is awake/alert, moves all extremitiesx4, left cranial nerve 7 palsy noted, all other cranial nerves intact, no past pointing, gait at baseline.  She is unable to forcibly resist opening her left eyelid EXTREMITIES: pulses normal, full ROM SKIN: warm, color normal PSYCH: no  abnormalities of mood noted  ED Course  Procedures  DIAGNOSTIC STUDIES: Oxygen Saturation is 100% on room air, normal by my interpretation.    COORDINATION OF CARE: 12:28PM EDP discusses pt ED treatment with pt.  My suspicion for CVA is low She has presentation c/w bells palsy Discussed strict return precautions, discussed eye care and need for PCP followup  The patient appears reasonably screened and/or stabilized for discharge and I doubt any other medical condition or other [facility] requiring further screening, evaluation, or treatment in the ED at this time prior to discharge.     MDM  Nursing notes including past medical history, social history and family history reviewed and considered in documentation   I personally performed the services described in this documentation, which was scribed in my presence. The recorded information has been reviewed and considered.          Joya Gaskins, MD 10/16/11 1258

## 2011-10-16 NOTE — Discharge Instructions (Signed)
Bell's Palsy  Bell's palsy is a condition in which the muscles on one side of the face cannot move (paralysis). This is because the nerves in the face are paralyzed. It is most often thought to be caused by a virus. The virus causes swelling of the nerve that controls movement on one side of the face. The nerve travels through a tight space surrounded by bone. When the nerve swells, it can be compressed by the bone. This results in damage to the protective covering around the nerve. This damage interferes with how the nerve communicates with the muscles of the face. As a result, it can cause weakness or paralysis of the facial muscles.   Injury (trauma), tumor, and surgery may cause Bell's palsy, but most of the time the cause is unknown. It is a relatively common condition. It starts suddenly (abrupt onset) with the paralysis usually ending within 2 days. Bell's palsy is not dangerous. But because the eye does not close properly, you may need care to keep the eye from getting dry. This can include splinting (to keep the eye shut) or moistening with artificial tears. Bell's palsy very seldom occurs on both sides of the face at the same time.  SYMPTOMS    Eyebrow sagging.   Drooping of the eyelid and corner of the mouth.   Inability to close one eye.   Loss of taste on the front of the tongue.   Sensitivity to loud noises.  TREATMENT   The treatment is usually non-surgical. If the patient is seen within the first 24 to 48 hours, a short course of steroids may be prescribed, in an attempt to shorten the length of the condition. Antiviral medicines may also be used with the steroids, but it is unclear if they are helpful.   You will need to protect your eye, if you cannot close it. The cornea (clear covering over your eye) will become dry and can be damaged. Artificial tears can be used to keep your eye moist. Glasses or an eye patch should be worn to protect your eye.  PROGNOSIS   Recovery is variable, ranging  from days to months. Although the problem usually goes away completely (about 80% of cases resolve), predicting the outcome is impossible. Most people improve within 3 weeks of when the symptoms began. Improvement may continue for 3 to 6 months. A small number of people have moderate to severe weakness that is permanent.   HOME CARE INSTRUCTIONS    If your caregiver prescribed medication to reduce swelling in the nerve, use as directed. Do not stop taking the medication unless directed by your caregiver.   Use moisturizing eye drops as needed to prevent drying of your eye, as directed by your caregiver.   Protect your eye, as directed by your caregiver.   Use facial massage and exercises, as directed by your caregiver.   Perform your normal activities, and get your normal rest.  SEEK IMMEDIATE MEDICAL CARE IF:    There is pain, redness or irritation in the eye.   You or your child has an oral temperature above 102 F (38.9 C), not controlled by medicine.  MAKE SURE YOU:    Understand these instructions.   Will watch your condition.   Will get help right away if you are not doing well or get worse.  Document Released: 04/24/2005 Document Revised: 04/13/2011 Document Reviewed: 05/03/2009  Exit[employer] Patient Information 2012 [employer].

## 2011-10-16 NOTE — ED Notes (Signed)
Lt. Facial numbness, appears to be Bells Palsy, began yesterday no no s/s of any other weakness,  Pt. Denies any pain or discomfort, speech is clear

## 2012-03-21 ENCOUNTER — Other Ambulatory Visit: Payer: Self-pay | Admitting: Orthopedic Surgery

## 2012-03-21 MED ORDER — DEXAMETHASONE SODIUM PHOSPHATE 10 MG/ML IJ SOLN: 10.0000 mg | Freq: Once | INTRAMUSCULAR | Status: DC

## 2012-03-21 NOTE — Progress Notes (Signed)
Preoperative surgical orders have been place into the Epic hospital system for [name] on 03/21/2012, 10:45 AM  by Patrica Duel for surgery on 04/03/12.  Preop femur surgery orders including IV Tylenol and IV Decadron as long as there are no contraindications to the above medications. Avel Peace, PA-C

## 2012-03-28 ENCOUNTER — Encounter (HOSPITAL_COMMUNITY): Payer: Self-pay | Admitting: Pharmacy Technician

## 2012-04-01 NOTE — Patient Instructions (Signed)
[address] [name]  04/01/2012   Your procedure is scheduled on:  04/05/12 [id number]  Report to [facility] at 0515 AM.  Call this number if you have problems the morning of surgery: [phone]   Remember:   Do not eat food:After Midnight.  May have clear liquids:until Midnight .    Take these medicines the morning of surgery with A SIP OF WATER:    Do not wear jewelry, make-up or nail polish.  Do not wear lotions, powders, or perfumes.   Do not shave 48 hours prior to surgery.   Do not bring valuables to the hospital.  Contacts, dentures or bridgework may not be worn into surgery.  Leave suitcase in the car. After surgery it may be brought to your room.  For patients admitted to the hospital, checkout time is 11:00 AM the day of discharge.                   SEE CHG INSTRUCTION SHEET    Please read over the following fact sheets that you were given: MRSA Information, coughing and deep breathing exercises, Incentive Spirometry Fact sheet

## 2012-04-02 ENCOUNTER — Encounter (HOSPITAL_COMMUNITY)
Admission: RE | Admit: 2012-04-02 | Discharge: 2012-04-02 | Disposition: A | Discharge status: Home / Self Care | Payer: Medicare Other | Source: Ambulatory Visit | Attending: Orthopedic Surgery | Admitting: Orthopedic Surgery

## 2012-04-02 ENCOUNTER — Encounter (HOSPITAL_COMMUNITY): Payer: Self-pay

## 2012-04-02 HISTORY — DX: Adverse effect of unspecified anesthetic, initial encounter: T41.45XA

## 2012-04-02 HISTORY — DX: Anemia, unspecified: D64.9

## 2012-04-02 HISTORY — DX: Other complications of anesthesia, initial encounter: T88.59XA

## 2012-04-02 LAB — BASIC METABOLIC PANEL
Calcium: 9.9 mg/dL (ref 8.4–10.5)
Creatinine, Ser: 0.61 mg/dL (ref 0.50–1.10)
GFR calc non Af Amer: 85 mL/min — ABNORMAL LOW (ref >90)
Sodium: 136 mEq/L (ref 135–145)

## 2012-04-02 LAB — CBC
HCT: 38.9 % (ref 36.0–46.0)
Hemoglobin: 12.9 g/dL (ref 12.0–15.0)
MCH: 28.4 pg (ref 26.0–34.0)
MCHC: 33.2 g/dL (ref 30.0–36.0)
MCV: 85.7 fL (ref 78.0–100.0)
Platelets: 270 K/uL (ref 150–400)
RBC: 4.54 MIL/uL (ref 3.87–5.11)
RDW: 13.9 % (ref 11.5–15.5)
WBC: 6.5 K/uL (ref 4.0–10.5)

## 2012-04-02 LAB — SURGICAL PCR SCREEN: MRSA, PCR: NEGATIVE

## 2012-04-02 NOTE — Progress Notes (Signed)
Patient DOES NOT WANT GENERAL ANESTHESIA>  NOTE ALSO ON FRONT OF CHART.

## 2012-04-04 NOTE — Anesthesia Preprocedure Evaluation (Addendum)
Anesthesia Evaluation  Patient identified by MRN, date of birth, ID band Patient awake    Reviewed: Allergy & Precautions, H&P , NPO status , Patient's Chart, lab work & pertinent test results  Airway Mallampati: II TM Distance: >3 FB Neck ROM: full    Dental No notable dental hx. (+) Caps, Dental Advisory Given and Teeth Intact,    Pulmonary neg pulmonary ROS,  breath sounds clear to auscultation  Pulmonary exam normal       Cardiovascular Exercise Tolerance: Good hypertension, Pt. on medications negative cardio ROS  Rhythm:regular Rate:Normal     Neuro/Psych negative neurological ROS  negative psych ROS   GI/Hepatic negative GI ROS, Neg liver ROS,   Endo/Other  negative endocrine ROSHypothyroidism   Renal/GU negative Renal ROS  negative genitourinary   Musculoskeletal   Abdominal   Peds  Hematology negative hematology ROS (+)   Anesthesia Other Findings   Reproductive/Obstetrics negative OB ROS                          Anesthesia Physical Anesthesia Plan  ASA: II  Anesthesia Plan: Spinal   Post-op Pain Management:    Induction:   Airway Management Planned:   Additional Equipment:   Intra-op Plan:   Post-operative Plan:   Informed Consent: I have reviewed the patients History and Physical, chart, labs and discussed the procedure including the risks, benefits and alternatives for the proposed anesthesia with the patient or authorized representative who has indicated his/her understanding and acceptance.   Dental Advisory Given  Plan Discussed with: CRNA and Surgeon  Anesthesia Plan Comments:        Anesthesia Quick Evaluation

## 2012-04-05 ENCOUNTER — Encounter (HOSPITAL_COMMUNITY)
Admission: RE | Disposition: A | Discharge status: Home / Self Care | Payer: Self-pay | Source: Ambulatory Visit | Attending: Orthopedic Surgery

## 2012-04-05 ENCOUNTER — Observation Stay (HOSPITAL_COMMUNITY)
Admission: RE | Admit: 2012-04-05 | Discharge: 2012-04-06 | Disposition: A | Discharge status: Home / Self Care | Payer: Medicare Other | Source: Ambulatory Visit | Attending: Orthopedic Surgery | Admitting: Orthopedic Surgery

## 2012-04-05 ENCOUNTER — Ambulatory Visit (HOSPITAL_COMMUNITY): Payer: Medicare Other | Admitting: Anesthesiology

## 2012-04-05 ENCOUNTER — Encounter (HOSPITAL_COMMUNITY): Payer: Self-pay | Admitting: Anesthesiology

## 2012-04-05 ENCOUNTER — Encounter (HOSPITAL_COMMUNITY): Payer: Self-pay

## 2012-04-05 ENCOUNTER — Encounter (HOSPITAL_COMMUNITY): Payer: Self-pay | Admitting: *Deleted

## 2012-04-05 DIAGNOSIS — Z01812 Encounter for preprocedural laboratory examination: Secondary | ICD-10-CM | POA: Insufficient documentation

## 2012-04-05 DIAGNOSIS — Z853 Personal history of malignant neoplasm of breast: Secondary | ICD-10-CM | POA: Insufficient documentation

## 2012-04-05 DIAGNOSIS — Z79899 Other long term (current) drug therapy: Secondary | ICD-10-CM | POA: Insufficient documentation

## 2012-04-05 DIAGNOSIS — Y831 Surgical operation with implant of artificial internal device as the cause of abnormal reaction of the patient, or of later complication, without mention of misadventure at the time of the procedure: Secondary | ICD-10-CM | POA: Insufficient documentation

## 2012-04-05 DIAGNOSIS — T8484XA Pain due to internal orthopedic prosthetic devices, implants and grafts, initial encounter: Secondary | ICD-10-CM | POA: Diagnosis present

## 2012-04-05 DIAGNOSIS — I10 Essential (primary) hypertension: Secondary | ICD-10-CM | POA: Insufficient documentation

## 2012-04-05 DIAGNOSIS — T8489XA Other specified complication of internal orthopedic prosthetic devices, implants and grafts, initial encounter: Principal | ICD-10-CM | POA: Insufficient documentation

## 2012-04-05 DIAGNOSIS — E039 Hypothyroidism, unspecified: Secondary | ICD-10-CM | POA: Insufficient documentation

## 2012-04-05 HISTORY — PX: HARDWARE REMOVAL: SHX979

## 2012-04-05 SURGERY — REMOVAL, HARDWARE
Anesthesia: General | Site: Leg Upper | Laterality: Right | Wound class: Clean

## 2012-04-05 MED ORDER — METOCLOPRAMIDE HCL 5 MG/ML IJ SOLN
INTRAMUSCULAR | Status: DC | PRN
  Administered 2012-04-05: 10 mg via INTRAVENOUS

## 2012-04-05 MED ORDER — LACTATED RINGERS IV SOLN: INTRAVENOUS | Status: DC

## 2012-04-05 MED ORDER — TRAMADOL HCL 50 MG PO TABS
50.0000 mg | ORAL_TABLET | Freq: Four times a day (QID) | ORAL | Status: DC | PRN
Start: 2012-04-05 — End: 2012-04-06
  Administered 2012-04-05 – 2012-04-06 (×4): 50 mg via ORAL
  Filled 2012-04-05: qty 1
  Filled 2012-04-05: qty 2
  Filled 2012-04-05 (×2): qty 1

## 2012-04-05 MED ORDER — HYDROCODONE-ACETAMINOPHEN 5-325 MG PO TABS: 1.0000 | ORAL_TABLET | Freq: Four times a day (QID) | ORAL | Status: DC | PRN

## 2012-04-05 MED ORDER — HYDROCODONE-ACETAMINOPHEN 5-325 MG PO TABS: 1.0000 | ORAL_TABLET | ORAL | Status: DC | PRN

## 2012-04-05 MED ORDER — ONDANSETRON HCL 4 MG PO TABS: 4.0000 mg | ORAL_TABLET | Freq: Four times a day (QID) | ORAL | Status: DC | PRN

## 2012-04-05 MED ORDER — ALUM & MAG HYDROXIDE-SIMETH 200-200-20 MG/5ML PO SUSP
30.0000 mL | ORAL | Status: DC | PRN
  Administered 2012-04-05 – 2012-04-06 (×2): 30 mL via ORAL
  Filled 2012-04-05 (×2): qty 30

## 2012-04-05 MED ORDER — SODIUM CHLORIDE 0.9 % IV SOLN
INTRAVENOUS | Status: DC
  Administered 2012-04-05 – 2012-04-06 (×2): via INTRAVENOUS

## 2012-04-05 MED ORDER — BUPIVACAINE HCL (PF) 0.75 % IJ SOLN
INTRAMUSCULAR | Status: DC | PRN
  Administered 2012-04-05: 15 mg via INTRATHECAL

## 2012-04-05 MED ORDER — HYDROMORPHONE HCL PF 1 MG/ML IJ SOLN: 0.5000 mg | INTRAMUSCULAR | Status: DC | PRN

## 2012-04-05 MED ORDER — LACTATED RINGERS IV SOLN
INTRAVENOUS | Status: DC
  Administered 2012-04-05: 1000 mL via INTRAVENOUS
  Administered 2012-04-05 (×2): via INTRAVENOUS

## 2012-04-05 MED ORDER — SODIUM CHLORIDE 0.9 % IV SOLN: INTRAVENOUS | Status: DC

## 2012-04-05 MED ORDER — THERA M PLUS PO TABS: 1.0000 | ORAL_TABLET | Freq: Every day | ORAL | Status: DC

## 2012-04-05 MED ORDER — EPHEDRINE SULFATE 50 MG/ML IJ SOLN
INTRAMUSCULAR | Status: DC | PRN
  Administered 2012-04-05: 5 mg via INTRAVENOUS

## 2012-04-05 MED ORDER — METHOCARBAMOL 100 MG/ML IJ SOLN
500.0000 mg | Freq: Four times a day (QID) | INTRAVENOUS | Status: DC | PRN
  Filled 2012-04-05: qty 5

## 2012-04-05 MED ORDER — METHOCARBAMOL 500 MG PO TABS: 500.0000 mg | ORAL_TABLET | Freq: Four times a day (QID) | ORAL | Status: DC | PRN

## 2012-04-05 MED ORDER — CEFAZOLIN SODIUM-DEXTROSE 2-3 GM-% IV SOLR
2.0000 g | INTRAVENOUS | Status: AC
  Administered 2012-04-05: 2 g via INTRAVENOUS

## 2012-04-05 MED ORDER — CHLORHEXIDINE GLUCONATE 4 % EX LIQD
60.0000 mL | Freq: Once | CUTANEOUS | Status: DC
  Filled 2012-04-05: qty 60

## 2012-04-05 MED ORDER — DEXAMETHASONE SODIUM PHOSPHATE 10 MG/ML IJ SOLN
INTRAMUSCULAR | Status: DC | PRN
  Administered 2012-04-05: 10 mg via INTRAVENOUS

## 2012-04-05 MED ORDER — ACETAMINOPHEN 10 MG/ML IV SOLN: 1000.0000 mg | Freq: Once | INTRAVENOUS | Status: DC

## 2012-04-05 MED ORDER — FENTANYL CITRATE 0.05 MG/ML IJ SOLN: 25.0000 ug | INTRAMUSCULAR | Status: DC | PRN

## 2012-04-05 MED ORDER — ADULT MULTIVITAMIN W/MINERALS CH
1.0000 | ORAL_TABLET | Freq: Every day | ORAL | Status: DC
  Administered 2012-04-05: 1 via ORAL
  Filled 2012-04-05 (×2): qty 1

## 2012-04-05 MED ORDER — ASPIRIN EC 81 MG PO TBEC
81.0000 mg | DELAYED_RELEASE_TABLET | Freq: Every day | ORAL | Status: DC
  Administered 2012-04-05: 81 mg via ORAL
  Filled 2012-04-05 (×2): qty 1

## 2012-04-05 MED ORDER — KETAMINE HCL 10 MG/ML IJ SOLN
INTRAMUSCULAR | Status: DC | PRN
  Administered 2012-04-05: 25 mg via INTRAVENOUS
  Administered 2012-04-05: 2 mg via INTRAVENOUS

## 2012-04-05 MED ORDER — VITAMIN D3 25 MCG (1000 UNIT) PO TABS
1000.0000 [IU] | ORAL_TABLET | Freq: Every day | ORAL | Status: DC
  Administered 2012-04-05: 1000 [IU] via ORAL
  Filled 2012-04-05 (×2): qty 1

## 2012-04-05 MED ORDER — ENOXAPARIN SODIUM 40 MG/0.4ML ~~LOC~~ SOLN
40.0000 mg | SUBCUTANEOUS | Status: DC
  Filled 2012-04-05 (×2): qty 0.4

## 2012-04-05 MED ORDER — PROPOFOL 10 MG/ML IV EMUL
INTRAVENOUS | Status: DC | PRN
  Administered 2012-04-05: 120 ug/kg/min via INTRAVENOUS

## 2012-04-05 MED ORDER — METHOCARBAMOL 500 MG PO TABS: 500.0000 mg | ORAL_TABLET | Freq: Four times a day (QID) | ORAL | Status: DC

## 2012-04-05 MED ORDER — LEVOTHYROXINE SODIUM 125 MCG PO TABS
125.0000 ug | ORAL_TABLET | Freq: Every day | ORAL | Status: DC
  Administered 2012-04-06: 125 ug via ORAL
  Filled 2012-04-05 (×2): qty 1

## 2012-04-05 MED ORDER — AMLODIPINE BESYLATE 5 MG PO TABS
5.0000 mg | ORAL_TABLET | Freq: Every day | ORAL | Status: DC
  Administered 2012-04-06: 5 mg via ORAL
  Filled 2012-04-05: qty 1

## 2012-04-05 MED ORDER — METOCLOPRAMIDE HCL 10 MG PO TABS: 5.0000 mg | ORAL_TABLET | Freq: Three times a day (TID) | ORAL | Status: DC | PRN

## 2012-04-05 MED ORDER — ONDANSETRON HCL 4 MG/2ML IJ SOLN: 4.0000 mg | Freq: Four times a day (QID) | INTRAMUSCULAR | Status: DC | PRN

## 2012-04-05 MED ORDER — PHENYLEPHRINE HCL 10 MG/ML IJ SOLN
INTRAMUSCULAR | Status: DC | PRN
  Administered 2012-04-05 (×2): 80 ug via INTRAVENOUS
  Administered 2012-04-05 (×2): 40 ug via INTRAVENOUS
  Administered 2012-04-05: 120 ug via INTRAVENOUS
  Administered 2012-04-05: 80 ug via INTRAVENOUS
  Administered 2012-04-05: 40 ug via INTRAVENOUS
  Administered 2012-04-05: 80 ug via INTRAVENOUS

## 2012-04-05 MED ORDER — CEFAZOLIN SODIUM 1-5 GM-% IV SOLN
1.0000 g | Freq: Four times a day (QID) | INTRAVENOUS | Status: AC
  Administered 2012-04-05 – 2012-04-06 (×3): 1 g via INTRAVENOUS
  Filled 2012-04-05 (×3): qty 50

## 2012-04-05 MED ORDER — ONDANSETRON HCL 4 MG/2ML IJ SOLN
INTRAMUSCULAR | Status: DC | PRN
  Administered 2012-04-05: 4 mg via INTRAVENOUS

## 2012-04-05 MED ORDER — ACETAMINOPHEN 10 MG/ML IV SOLN
INTRAVENOUS | Status: DC | PRN
  Administered 2012-04-05: 1000 mg via INTRAVENOUS

## 2012-04-05 MED ORDER — CALCIUM CARBONATE-VITAMIN D 500-200 MG-UNIT PO TABS
1.0000 | ORAL_TABLET | Freq: Two times a day (BID) | ORAL | Status: DC
  Administered 2012-04-05: 1 via ORAL
  Filled 2012-04-05 (×3): qty 1

## 2012-04-05 MED ORDER — 0.9 % SODIUM CHLORIDE (POUR BTL) OPTIME
TOPICAL | Status: DC | PRN
  Administered 2012-04-05: 1000 mL

## 2012-04-05 MED ORDER — TEMAZEPAM 15 MG PO CAPS: 15.0000 mg | ORAL_CAPSULE | Freq: Every evening | ORAL | Status: DC | PRN

## 2012-04-05 MED ORDER — METOCLOPRAMIDE HCL 5 MG/ML IJ SOLN: 5.0000 mg | Freq: Three times a day (TID) | INTRAMUSCULAR | Status: DC | PRN

## 2012-04-05 MED ORDER — MIDAZOLAM HCL 5 MG/5ML IJ SOLN
INTRAMUSCULAR | Status: DC | PRN
  Administered 2012-04-05 (×4): 1 mg via INTRAVENOUS

## 2012-04-05 SURGICAL SUPPLY — 43 items
BANDAGE ELASTIC 6 VELCRO ST LF (GAUZE/BANDAGES/DRESSINGS) IMPLANT
BANDAGE ESMARK 6X9 LF (GAUZE/BANDAGES/DRESSINGS) IMPLANT
BNDG ESMARK 6X9 LF (GAUZE/BANDAGES/DRESSINGS)
CLOTH BEACON ORANGE TIMEOUT ST (SAFETY) ×2 IMPLANT
CLSR STERI-STRIP ANTIMIC 1/2X4 (GAUZE/BANDAGES/DRESSINGS) ×2 IMPLANT
CUFF TOURN SGL QUICK 18 (TOURNIQUET CUFF) IMPLANT
CUFF TOURN SGL QUICK 34 (TOURNIQUET CUFF)
CUFF TRNQT CYL 34X4X40X1 (TOURNIQUET CUFF) IMPLANT
DRAPE C-ARM 42X72 X-RAY (DRAPES) IMPLANT
DRAPE C-ARMOR (DRAPES) IMPLANT
DRAPE EXTREMITY T 121X128X90 (DRAPE) IMPLANT
DRAPE INCISE IOBAN 66X45 STRL (DRAPES) ×2 IMPLANT
DRAPE ORTHO SPLIT 77X108 STRL (DRAPES) ×2
DRAPE SURG ORHT 6 SPLT 77X108 (DRAPES) ×2 IMPLANT
DRAPE U-SHAPE 47X51 STRL (DRAPES) ×2 IMPLANT
DRSG ADAPTIC 3X8 NADH LF (GAUZE/BANDAGES/DRESSINGS) ×2 IMPLANT
DRSG EMULSION OIL 3X16 NADH (GAUZE/BANDAGES/DRESSINGS) ×2 IMPLANT
DRSG MEPILEX BORDER 4X12 (GAUZE/BANDAGES/DRESSINGS) ×4 IMPLANT
DRSG PAD ABDOMINAL 8X10 ST (GAUZE/BANDAGES/DRESSINGS) ×2 IMPLANT
DURAPREP 26ML APPLICATOR (WOUND CARE) ×2 IMPLANT
ELECT REM PT RETURN 9FT ADLT (ELECTROSURGICAL) ×2
ELECTRODE REM PT RTRN 9FT ADLT (ELECTROSURGICAL) ×1 IMPLANT
GLOVE BIO SURGEON STRL SZ7.5 (GLOVE) ×2 IMPLANT
GLOVE BIO SURGEON STRL SZ8 (GLOVE) ×4 IMPLANT
GOWN STRL NON-REIN LRG LVL3 (GOWN DISPOSABLE) ×4 IMPLANT
GOWN STRL REIN XL XLG (GOWN DISPOSABLE) ×2 IMPLANT
KIT BASIN OR (CUSTOM PROCEDURE TRAY) ×2 IMPLANT
MANIFOLD NEPTUNE II (INSTRUMENTS) ×2 IMPLANT
NS IRRIG 1000ML POUR BTL (IV SOLUTION) ×2 IMPLANT
PACK TOTAL JOINT (CUSTOM PROCEDURE TRAY) ×2 IMPLANT
PADDING CAST COTTON 6X4 STRL (CAST SUPPLIES) IMPLANT
POSITIONER SURGICAL ARM (MISCELLANEOUS) IMPLANT
SPONGE GAUZE 4X4 12PLY (GAUZE/BANDAGES/DRESSINGS) IMPLANT
SPONGE LAP 18X18 X RAY DECT (DISPOSABLE) ×2 IMPLANT
STAPLER VISISTAT 35W (STAPLE) IMPLANT
STRIP CLOSURE SKIN 1/2X4 (GAUZE/BANDAGES/DRESSINGS) ×2 IMPLANT
SUT MNCRL AB 4-0 PS2 18 (SUTURE) ×2 IMPLANT
SUT VIC AB 0 CT1 36 (SUTURE) ×4 IMPLANT
SUT VIC AB 2-0 CT1 27 (SUTURE) ×3
SUT VIC AB 2-0 CT1 TAPERPNT 27 (SUTURE) ×3 IMPLANT
TOWEL OR 17X26 10 PK STRL BLUE (TOWEL DISPOSABLE) ×2 IMPLANT
UNDERPAD 30X30 INCONTINENT (UNDERPADS AND DIAPERS) IMPLANT
WATER STERILE IRR 1500ML POUR (IV SOLUTION) IMPLANT

## 2012-04-05 NOTE — Transfer of Care (Signed)
Immediate Anesthesia Transfer of Care Note  Patient: [name]  Procedure(s) Performed: Procedure(s) (LRB) with comments: HARDWARE REMOVAL (Right) - Removal of Hardware Right Femur   Patient Location: PACU  Anesthesia Type:MAC and Spinal  Level of Consciousness: awake, sedated, patient cooperative and responds to stimulation  Airway & Oxygen Therapy: Patient Spontanous Breathing and Patient connected to face mask oxygen  Post-op Assessment: Report given to PACU RN and Post -op Vital signs reviewed and stable, unable to determine spinal level, patient groggy  Post vital signs: Reviewed and stable  Complications: No apparent anesthesia complications

## 2012-04-05 NOTE — Preoperative (Signed)
Beta Blockers   Reason not to administer Beta Blockers:Not Applicable, not on home BB 

## 2012-04-05 NOTE — Op Note (Signed)
Tiffany, Petty NO.:  [id number]  MEDICAL RECORD NO.:  [id number]  LOCATION:                                 FACILITY:  PHYSICIAN:  Ollen Gross, M.D.    DATE OF BIRTH:  Apr 13, 1932  DATE OF PROCEDURE:  04/05/2012 DATE OF DISCHARGE:                              OPERATIVE REPORT   PREOPERATIVE DIAGNOSIS:  Painful hardware, right femur.  POSTOPERATIVE DIAGNOSIS:  Painful hardware, right femur.  PROCEDURE:  Right femur hardware removal.  SURGEON:  Ollen Gross, M.D.  ASSISTANT:  None.  ANESTHESIA:  Spinal.  ESTIMATED BLOOD LOSS:  Minimal.  DRAINS:  None.  COMPLICATIONS:  None.  CONDITION:  Stable to recovery.  BRIEF CLINICAL NOTE:  Ms. Straw is a 78 year old female, who had open reduction and internal fixation of a right periprosthetic femur fracture in  March of this year.  She went on to heal uneventfully.  In the past 2 months she has noticed pain on the lateral aspect of her thigh consistent with irritation from the hardware.  She presents now for hardware removal.  PROCEDURE IN DETAIL:  After successful administration of general anesthetic, the patient was placed in left lateral decubitus position with the right side up and held with the hip positioner.  Right lower extremities isolated from perineum with plastic drapes and prepped and draped in a usual sterile fashion.  Previous lateral incisions were utilized.  Skin cut with a 10 blade through subcutaneous tissue to the fascia lata which was incised in line with the skin incision.  We then incised the fascia of the vastus lateralis and cut through scar tissue to get to the plate.  The four cables were identified and cut and then the two screws in the plate were removed.  I was then able to easily remove the plate without any damage to the underlying bone.  The wound was then copiously irrigated with saline solution.  The fascia of the vastus lateralis was then closed with a running  #1 Vicryl and the fascia lata closed with #1 Vicryl also, subcu was closed with interrupted 2-0 Vicryl and subcuticular running 4-0 Monocryl.  Incisions then cleaned and dried and Steri-Strips and a bulky sterile dressing applied.  She was then awakened and transported to recovery in stable condition.     Ollen Gross, M.D.     FA/MEDQ  D:  04/05/2012  T:  04/05/2012  Job:  [id number]

## 2012-04-05 NOTE — Brief Op Note (Signed)
04/05/2012  8:31 AM  PATIENT:  [name]  78 y.o. female  PRE-OPERATIVE DIAGNOSIS:  painful hardware of right femur  POST-OPERATIVE DIAGNOSIS:  painful hardware of right femur  PROCEDURE:  Procedure(s) (LRB) with comments: HARDWARE REMOVAL (Right) - Removal of Hardware Right Femur   SURGEON:  Surgeon(s) and Role:    * Loanne Drilling, MD - Primary  PHYSICIAN ASSISTANT:   ASSISTANTS: none   ANESTHESIA:   spinal  EBL:  Total I/O In: 2000 [I.V.:2000] Out: -   BLOOD ADMINISTERED:none  DRAINS: none   LOCAL MEDICATIONS USED:  NONE  SPECIMEN:  No Specimen  COUNTS:  YES  TOURNIQUET:  * No tourniquets in log *  DICTATION: .Other Dictation: Dictation Number [id number]  PLAN OF CARE: Admit for overnight observation  PATIENT DISPOSITION:  PACU - hemodynamically stable.

## 2012-04-05 NOTE — Anesthesia Postprocedure Evaluation (Signed)
  Anesthesia Post-op Note  Patient: [name]  Procedure(s) Performed: Procedure(s) (LRB): HARDWARE REMOVAL (Right)  Patient Location: PACU  Anesthesia Type: Spinal  Level of Consciousness: awake and alert   Airway and Oxygen Therapy: Patient Spontanous Breathing  Post-op Pain: mild  Post-op Assessment: Post-op Vital signs reviewed, Patient's Cardiovascular Status Stable, Respiratory Function Stable, Patent Airway and No signs of Nausea or vomiting  Last Vitals:  Filed Vitals:   04/05/12 0900  BP: 127/57  Pulse: 56  Temp:   Resp: 15    Post-op Vital Signs: stable   Complications: No apparent anesthesia complications

## 2012-04-05 NOTE — Anesthesia Procedure Notes (Signed)
Spinal  Patient location during procedure: OR Start time: 04/05/2012 7:23 AM End time: 04/05/2012 7:27 AM Staffing Anesthesiologist: Ronelle Nigh L Performed by: anesthesiologist  Preanesthetic Checklist Completed: patient identified, site marked, surgical consent, pre-op evaluation, timeout performed, IV checked, risks and benefits discussed and monitors and equipment checked Spinal Block Patient position: sitting Prep: Betadine Patient monitoring: heart rate, continuous pulse ox and blood pressure Approach: midline Location: L3-4 Injection technique: single-shot Needle Needle type: Spinocan  Needle gauge: 22 G Needle length: 9 cm Assessment Sensory level: T6 Additional Notes Expiration date of kit checked and confirmed. Patient tolerated procedure well, without complications.

## 2012-04-05 NOTE — Interval H&P Note (Signed)
History and Physical Interval Note:  04/05/2012 7:05 AM  [name]  has presented today for surgery, with the diagnosis of painful hardware of right femur  The various methods of treatment have been discussed with the patient and family. After consideration of risks, benefits and other options for treatment, the patient has consented to  Procedure(s) (LRB) with comments: HARDWARE REMOVAL (Right) - Removal of Hardware Right Femur  as a surgical intervention .  The patient's history has been reviewed, patient examined, no change in status, stable for surgery.  I have reviewed the patient's chart and labs.  Questions were answered to the patient's satisfaction.     Loanne Drilling

## 2012-04-05 NOTE — H&P (Signed)
CC- [name] is a 78 y.o. female who presents with right hip pain  Hip Pain: [name] presents with right lateral thigh pain secondary to irritation from her hardware. She had ORIF of a right periprosthetic femur fracture on 07/31/11 and healed the fracture uneventfully with radiographic union of the fracture site. She has developed, in the past 2-3 months, lateral pain with lying on her right side and with ambulation. She presents now for hardware removal as it is felt that the pain is coming from irritation of her IT band from the plate.  Past Medical History  Diagnosis Date  . Hypothyroidism 3-21- 13    tx. levothyroxine  . Blood transfusion 07-27-11    post surgery some time ago  . Cancer 07-27-11    Breast cancer -s/p lt. mastectomy, some squamous cell lesions  . Complication of anesthesia     epinephrine - screams uncontrollably   . Arthritis 07-27-11    osteoarthritis-hips/s/p bil. THA, arthritis right hand   . Anemia     hx of with surgery     Past Surgical History  Procedure Date  . Abdominal hysterectomy   . Ankle surgery 07-27-11    right ankle tendon repair  . Mastectomy 07-27-11    left  . Cataract extraction, bilateral 07-27-11    Bilateral  . Squamous cell carcinoma excision 07-27-11    x2 left shin  . Joint replacement 07-27-11    Bilateral: Right THA - 04/2000, Left THA 06/2001  . Breast surgery 07-27-11    lt.breast cancer intraductal cancer, mastectomy  . Orif femur fracture 07/31/2011    Procedure: OPEN REDUCTION INTERNAL FIXATION (ORIF) DISTAL FEMUR FRACTURE;  Surgeon: Loanne Drilling, MD;  Location: [facility];  Service: Orthopedics;  Laterality: Right;  right femur  (c-arm)     Prior to Admission medications   Medication Sig Start Date End Date Taking? Authorizing Provider  amLODipine (NORVASC) 5 MG tablet Take 5 mg by mouth daily before breakfast.    Yes Historical Provider, MD  calcium-vitamin D (OSCAL WITH D) 500-200 MG-UNIT per tablet Take 1 tablet by mouth 2  (two) times daily.    Yes Historical Provider, MD  cholecalciferol (VITAMIN D) 1000 UNITS tablet Take 1,000 Units by mouth daily.   Yes Historical Provider, MD  diclofenac sodium (VOLTAREN) 1 % GEL Apply 2 g topically as needed.   Yes Historical Provider, MD  levothyroxine (SYNTHROID, LEVOTHROID) 125 MCG tablet Take 125 mcg by mouth daily before breakfast.   Yes Historical Provider, MD  acetaminophen (TYLENOL) 500 MG tablet Take 500 mg by mouth every 6 (six) hours as needed. Pain    Historical Provider, MD  aspirin EC 81 MG tablet Take 81 mg by mouth daily.    Historical Provider, MD  fish oil-omega-3 fatty acids 1000 MG capsule Take 1 g by mouth daily.    Historical Provider, MD  Multiple Vitamins-Minerals (MULTIVITAMINS THER. W/MINERALS) TABS Take 1 tablet by mouth daily.    Historical Provider, MD  naproxen sodium (ANAPROX) 220 MG tablet Take 220 mg by mouth 2 (two) times daily with a meal.    Historical Provider, MD    Physical Examination: General appearance - alert, well appearing, and in no distress Mental status - alert, oriented to person, place, and time Chest - clear to auscultation, no wheezes, rales or rhonchi, symmetric air entry Heart - normal rate, regular rhythm, normal S1, S2, no murmurs, rubs, clicks or gallops Abdomen - soft, nontender, nondistended, no masses  or organomegaly Neurological - alert, oriented, normal speech, no focal findings or movement disorder noted  A right hip exam was performed. SKIN: intact SWELLING: none TENDERNESS: Along lateral hip and lateral thigh over the palpable hardware ROM: normal STRENGTH: normal GAIT: antalgic  ASSESSMENT: Painful hardware right femur  Plan Hardware removal right femur  Gus Rankin. Sira Adsit, MD    04/05/2012, 7:01 AM

## 2012-04-06 MED ORDER — DOCUSATE SODIUM 100 MG PO CAPS
100.0000 mg | ORAL_CAPSULE | Freq: Every day | ORAL | Status: DC
  Administered 2012-04-06: 100 mg via ORAL
  Filled 2012-04-06: qty 1

## 2012-04-06 NOTE — Progress Notes (Signed)
In the presence of her "friend" I give pt. Her d/c instructions.  She declines to take her prescriptions, stating "I have all the medicine I need at home".  She enquires about the possible need for home PT through [facility], which she had had in the past; however, she chooses to leave and call her doctor about this rather than await the arrival of our case manager.  She states she has adequate supplies of Ultram and methocarbamol at home, and in no case does she plan to use hydrocodone.  She is taken to her frineds' vehicle at this time per w/c after she signs for the receipt of her instructions without incident.

## 2012-04-06 NOTE — Evaluation (Signed)
Physical Therapy Evaluation Patient Details Name: [name] MRN: [id number] DOB: November 29, 1931 Today's Date: 04/06/2012 Time: 1010-1028 PT Time Calculation (min): 18 min  PT Assessment / Plan / Recommendation Clinical Impression  78 y.o. female admitted for removal of R hip hardware from prior ORIF in March 2013. REviewed hip ROM exercises (standing hip ABD, ext, marching) and reviewed use of RW. Pt ok to DC home from PT standpoint, would benefit from HHPT for strengthening of R hip.     PT Assessment  All further PT needs can be met in the next venue of care    Follow Up Recommendations  Home health PT    Does the patient have the potential to tolerate intense rehabilitation      Barriers to Discharge        Equipment Recommendations  None recommended by PT    Recommendations for Other Services     Frequency      Precautions / Restrictions Restrictions Weight Bearing Restrictions: No RLE Weight Bearing: Weight bearing as tolerated   Pertinent Vitals/Pain *3/10 R hip**      Mobility  Bed Mobility Bed Mobility: Not assessed Transfers Transfers: Sit to Stand;Stand to Sit Sit to Stand: 6: Modified independent (Device/Increase time);From chair/3-in-1;With upper extremity assist Stand to Sit: 6: Modified independent (Device/Increase time);To chair/3-in-1;With upper extremity assist Ambulation/Gait Ambulation/Gait Assistance: 6: Modified independent (Device/Increase time) Ambulation Distance (Feet): 150 Feet Assistive device: Rolling walker Gait Pattern: Step-through pattern General Gait Details: VCs for heel strike RLE    Shoulder Instructions     Exercises General Exercises - Lower Extremity Mini-Sqauts:  (pt very familiar with hip exercises, verbally reviewed them)   PT Diagnosis:    PT Problem List: Pain;Decreased range of motion;Decreased strength PT Treatment Interventions:     PT Goals    Visit Information  Last PT Received On: 04/06/12 Assistance  Needed: +1    Subjective Data  Subjective: I'd like to get back to hiking and walking.   Patient Stated Goal: hiking/walking   Prior Functioning  Home Living Lives With: Alone Available Help at Discharge: Friend(s);Family Type of Home: House Home Layout: One level Home Adaptive Equipment: Walker - rolling;Straight cane;Shower chair without back Prior Function Level of Independence: Independent with assistive device(s) Able to Take Stairs?: Yes Driving: Yes Communication Communication: No difficulties    Cognition  Overall Cognitive Status: Appears within functional limits for tasks assessed/performed Arousal/Alertness: Awake/alert Orientation Level: Appears intact for tasks assessed Behavior During Session: [facility] for tasks performed    Extremity/Trunk Assessment Right Upper Extremity Assessment RUE ROM/Strength/Tone: [facility] for tasks assessed Left Upper Extremity Assessment LUE ROM/Strength/Tone: WFL for tasks assessed Right Lower Extremity Assessment RLE ROM/Strength/Tone: Deficits;Due to pain RLE ROM/Strength/Tone Deficits: knee ext 3/5, hip flexion/ABD -3/5 Left Lower Extremity Assessment LLE ROM/Strength/Tone: Within functional levels LLE Sensation: WFL - Light Touch LLE Coordination: WFL - gross/fine motor Trunk Assessment Trunk Assessment: Normal   Balance    End of Session PT - End of Session Activity Tolerance: Patient tolerated treatment well Patient left: in chair;with call bell/phone within reach;with family/visitor present Nurse Communication: Mobility status  GP Functional Assessment Tool Used: clinical judgement Functional Limitation: Mobility: Walking and moving around Mobility: Walking and Moving Around Current Status ([id number]): At least 20 percent but less than 40 percent impaired, limited or restricted Mobility: Walking and Moving Around Goal Status [phone]): At least 20 percent but less than 40 percent impaired, limited or restricted Mobility: Walking and  Moving Around Discharge Status [phone]): At least  20 percent but less than 40 percent impaired, limited or restricted   Tamala Ser 04/06/2012, 11:26 AM  [phone]

## 2012-04-06 NOTE — Progress Notes (Signed)
Orthopedics Progress Note  Subjective: Pt sitting up comfortably in no acute distress No pain to right hip s/p hardware removal  Objective:  Filed Vitals:   04/06/12 0606  BP: 162/75  Pulse: 54  Temp: 98.6 F (37 C)  Resp: 20    General: Awake and alert  Musculoskeletal: right hip incision healing, minimal bleeding controlled, nv intact distally Neurovascularly intact  Lab Results  Component Value Date   WBC 6.5 04/02/2012   HGB 12.9 04/02/2012   HCT 38.9 04/02/2012   MCV 85.7 04/02/2012   PLT 270 04/02/2012       Component Value Date/Time   NA 136 04/02/2012 1025   K 5.1 04/02/2012 1025   CL 100 04/02/2012 1025   CO2 29 04/02/2012 1025   GLUCOSE 74 04/02/2012 1025   BUN 14 04/02/2012 1025   CREATININE 0.61 04/02/2012 1025   CALCIUM 9.9 04/02/2012 1025   GFRNONAA 85* 04/02/2012 1025   GFRAA >90 04/02/2012 1025    Lab Results  Component Value Date   INR 0.94 07/27/2011   INR 1.0 10/09/2006    Assessment/Plan: POD #1 s/p Procedure(s):right hip HARDWARE REMOVAL  D/c home today Stool softener F/u in 2 weeks   Almedia Balls. Ranell Patrick, MD 04/06/2012 7:30 AM

## 2012-04-08 ENCOUNTER — Encounter (HOSPITAL_COMMUNITY): Payer: Self-pay | Admitting: Orthopedic Surgery

## 2012-04-10 ENCOUNTER — Inpatient Hospital Stay (HOSPITAL_COMMUNITY): Payer: Medicare Other

## 2012-04-10 ENCOUNTER — Encounter (HOSPITAL_COMMUNITY): Payer: Self-pay | Admitting: Anesthesiology

## 2012-04-10 ENCOUNTER — Encounter (HOSPITAL_COMMUNITY): Payer: Self-pay

## 2012-04-10 ENCOUNTER — Other Ambulatory Visit: Payer: Self-pay | Admitting: Orthopedic Surgery

## 2012-04-10 ENCOUNTER — Inpatient Hospital Stay (HOSPITAL_COMMUNITY): Payer: Medicare Other | Admitting: Anesthesiology

## 2012-04-10 ENCOUNTER — Encounter (HOSPITAL_COMMUNITY)
Admission: AD | Disposition: A | Discharge status: Home Health | Payer: Self-pay | Source: Ambulatory Visit | Attending: Orthopedic Surgery

## 2012-04-10 ENCOUNTER — Inpatient Hospital Stay (HOSPITAL_COMMUNITY)
Admission: AD | Admit: 2012-04-10 | Discharge: 2012-04-13 | DRG: 467 | Disposition: A | Discharge status: Home Health | Payer: Medicare Other | Source: Ambulatory Visit | Attending: Orthopedic Surgery | Admitting: Orthopedic Surgery

## 2012-04-10 DIAGNOSIS — E039 Hypothyroidism, unspecified: Secondary | ICD-10-CM | POA: Diagnosis present

## 2012-04-10 DIAGNOSIS — Y831 Surgical operation with implant of artificial internal device as the cause of abnormal reaction of the patient, or of later complication, without mention of misadventure at the time of the procedure: Secondary | ICD-10-CM | POA: Diagnosis present

## 2012-04-10 DIAGNOSIS — D62 Acute posthemorrhagic anemia: Secondary | ICD-10-CM | POA: Diagnosis present

## 2012-04-10 DIAGNOSIS — T84049A Periprosthetic fracture around unspecified internal prosthetic joint, initial encounter: Principal | ICD-10-CM | POA: Diagnosis present

## 2012-04-10 DIAGNOSIS — E871 Hypo-osmolality and hyponatremia: Secondary | ICD-10-CM | POA: Diagnosis present

## 2012-04-10 DIAGNOSIS — Z9660 Presence of unspecified orthopedic joint implant: Principal | ICD-10-CM | POA: Diagnosis present

## 2012-04-10 DIAGNOSIS — Z9289 Personal history of other medical treatment: Secondary | ICD-10-CM

## 2012-04-10 DIAGNOSIS — Z96649 Presence of unspecified artificial hip joint: Secondary | ICD-10-CM

## 2012-04-10 DIAGNOSIS — Z853 Personal history of malignant neoplasm of breast: Secondary | ICD-10-CM

## 2012-04-10 DIAGNOSIS — M978XXA Periprosthetic fracture around other internal prosthetic joint, initial encounter: Secondary | ICD-10-CM

## 2012-04-10 HISTORY — PX: TOTAL HIP REVISION: SHX763

## 2012-04-10 LAB — BASIC METABOLIC PANEL
CO2: 31 mEq/L (ref 19–32)
Calcium: 10 mg/dL (ref 8.4–10.5)
GFR calc non Af Amer: 84 mL/min — ABNORMAL LOW (ref >90)
Sodium: 135 mEq/L (ref 135–145)

## 2012-04-10 LAB — CBC
MCV: 84.7 fL (ref 78.0–100.0)
Platelets: 267 10*3/uL (ref 150–400)
RBC: 4.04 MIL/uL (ref 3.87–5.11)
WBC: 7.8 10*3/uL (ref 4.0–10.5)

## 2012-04-10 LAB — PROTIME-INR
INR: 0.92 (ref 0.00–1.49)
Prothrombin Time: 12.3 seconds (ref 11.6–15.2)

## 2012-04-10 LAB — APTT: aPTT: 37 seconds (ref 24–37)

## 2012-04-10 SURGERY — TOTAL HIP REVISION
Anesthesia: General | Site: Hip | Laterality: Right | Wound class: Clean

## 2012-04-10 MED ORDER — METOCLOPRAMIDE HCL 10 MG PO TABS: 5.0000 mg | ORAL_TABLET | Freq: Three times a day (TID) | ORAL | Status: DC | PRN

## 2012-04-10 MED ORDER — CEFAZOLIN SODIUM-DEXTROSE 2-3 GM-% IV SOLR
2.0000 g | Freq: Once | INTRAVENOUS | Status: AC
  Administered 2012-04-10: 2 g via INTRAVENOUS

## 2012-04-10 MED ORDER — LACTATED RINGERS IV SOLN: INTRAVENOUS | Status: DC

## 2012-04-10 MED ORDER — DOCUSATE SODIUM 100 MG PO CAPS
100.0000 mg | ORAL_CAPSULE | Freq: Two times a day (BID) | ORAL | Status: DC
  Administered 2012-04-12 – 2012-04-13 (×3): 100 mg via ORAL

## 2012-04-10 MED ORDER — FLEET ENEMA 7-19 GM/118ML RE ENEM: 1.0000 | ENEMA | Freq: Once | RECTAL | Status: AC | PRN

## 2012-04-10 MED ORDER — PHENOL 1.4 % MT LIQD: 1.0000 | OROMUCOSAL | Status: DC | PRN

## 2012-04-10 MED ORDER — SODIUM CHLORIDE 0.9 % IV SOLN
INTRAVENOUS | Status: DC
  Administered 2012-04-10: 20 mL/h via INTRAVENOUS

## 2012-04-10 MED ORDER — FENTANYL CITRATE 0.05 MG/ML IJ SOLN
25.0000 ug | INTRAMUSCULAR | Status: DC | PRN
  Administered 2012-04-10 (×4): 25 ug via INTRAVENOUS

## 2012-04-10 MED ORDER — FENTANYL CITRATE 0.05 MG/ML IJ SOLN
INTRAMUSCULAR | Status: AC
  Filled 2012-04-10: qty 2

## 2012-04-10 MED ORDER — PROMETHAZINE HCL 25 MG/ML IJ SOLN: 6.2500 mg | INTRAMUSCULAR | Status: DC | PRN

## 2012-04-10 MED ORDER — ACETAMINOPHEN 325 MG PO TABS
650.0000 mg | ORAL_TABLET | Freq: Four times a day (QID) | ORAL | Status: DC | PRN
  Administered 2012-04-11 – 2012-04-12 (×2): 650 mg via ORAL
  Filled 2012-04-10 (×3): qty 2

## 2012-04-10 MED ORDER — AMLODIPINE BESYLATE 5 MG PO TABS
5.0000 mg | ORAL_TABLET | Freq: Every day | ORAL | Status: DC
  Administered 2012-04-11 – 2012-04-13 (×3): 5 mg via ORAL
  Filled 2012-04-10 (×3): qty 1

## 2012-04-10 MED ORDER — HYDROCODONE-ACETAMINOPHEN 5-325 MG PO TABS: 1.0000 | ORAL_TABLET | Freq: Four times a day (QID) | ORAL | Status: DC | PRN

## 2012-04-10 MED ORDER — CEFAZOLIN SODIUM 1-5 GM-% IV SOLN
1.0000 g | Freq: Four times a day (QID) | INTRAVENOUS | Status: AC
  Administered 2012-04-11 (×2): 1 g via INTRAVENOUS
  Filled 2012-04-10 (×2): qty 50

## 2012-04-10 MED ORDER — FENTANYL CITRATE 0.05 MG/ML IJ SOLN
INTRAMUSCULAR | Status: DC | PRN
  Administered 2012-04-10: 100 ug via INTRAVENOUS
  Administered 2012-04-10 (×3): 50 ug via INTRAVENOUS

## 2012-04-10 MED ORDER — POLYETHYLENE GLYCOL 3350 17 G PO PACK: 17.0000 g | PACK | Freq: Every day | ORAL | Status: DC | PRN

## 2012-04-10 MED ORDER — BISACODYL 10 MG RE SUPP: 10.0000 mg | Freq: Every day | RECTAL | Status: DC | PRN

## 2012-04-10 MED ORDER — DIPHENHYDRAMINE HCL 12.5 MG/5ML PO ELIX: 12.5000 mg | ORAL_SOLUTION | ORAL | Status: DC | PRN

## 2012-04-10 MED ORDER — BUPIVACAINE LIPOSOME 1.3 % IJ SUSP
20.0000 mL | Freq: Once | INTRAMUSCULAR | Status: DC
  Filled 2012-04-10: qty 20

## 2012-04-10 MED ORDER — ONDANSETRON HCL 4 MG PO TABS
4.0000 mg | ORAL_TABLET | Freq: Four times a day (QID) | ORAL | Status: DC | PRN
  Administered 2012-04-13: 4 mg via ORAL
  Filled 2012-04-10: qty 1

## 2012-04-10 MED ORDER — METOCLOPRAMIDE HCL 5 MG/ML IJ SOLN: 5.0000 mg | Freq: Three times a day (TID) | INTRAMUSCULAR | Status: DC | PRN

## 2012-04-10 MED ORDER — DEXAMETHASONE SODIUM PHOSPHATE 10 MG/ML IJ SOLN: 10.0000 mg | Freq: Once | INTRAMUSCULAR | Status: DC

## 2012-04-10 MED ORDER — TRAMADOL HCL 50 MG PO TABS
50.0000 mg | ORAL_TABLET | Freq: Four times a day (QID) | ORAL | Status: DC | PRN
  Administered 2012-04-11 – 2012-04-12 (×3): 50 mg via ORAL
  Filled 2012-04-10: qty 2

## 2012-04-10 MED ORDER — DEXAMETHASONE SODIUM PHOSPHATE 10 MG/ML IJ SOLN
INTRAMUSCULAR | Status: DC | PRN
  Administered 2012-04-10: 10 mg via INTRAVENOUS

## 2012-04-10 MED ORDER — ONDANSETRON HCL 4 MG/2ML IJ SOLN
INTRAMUSCULAR | Status: DC | PRN
  Administered 2012-04-10: 4 mg via INTRAVENOUS

## 2012-04-10 MED ORDER — STERILE WATER FOR IRRIGATION IR SOLN
Status: DC | PRN
  Administered 2012-04-10: 3000 mL

## 2012-04-10 MED ORDER — ACETAMINOPHEN 10 MG/ML IV SOLN
INTRAVENOUS | Status: DC | PRN
  Administered 2012-04-10: 1000 mg via INTRAVENOUS

## 2012-04-10 MED ORDER — METHOCARBAMOL 500 MG PO TABS: 500.0000 mg | ORAL_TABLET | Freq: Four times a day (QID) | ORAL | Status: DC | PRN

## 2012-04-10 MED ORDER — METHOCARBAMOL 100 MG/ML IJ SOLN
500.0000 mg | Freq: Four times a day (QID) | INTRAVENOUS | Status: DC | PRN
  Administered 2012-04-10: 500 mg via INTRAVENOUS
  Filled 2012-04-10 (×2): qty 5

## 2012-04-10 MED ORDER — DEXAMETHASONE 6 MG PO TABS
10.0000 mg | ORAL_TABLET | Freq: Once | ORAL | Status: DC
  Filled 2012-04-10: qty 1

## 2012-04-10 MED ORDER — ACETAMINOPHEN 650 MG RE SUPP: 650.0000 mg | Freq: Four times a day (QID) | RECTAL | Status: DC | PRN

## 2012-04-10 MED ORDER — ROCURONIUM BROMIDE 100 MG/10ML IV SOLN
INTRAVENOUS | Status: DC | PRN
  Administered 2012-04-10: 50 mg via INTRAVENOUS

## 2012-04-10 MED ORDER — DOCUSATE SODIUM 100 MG PO CAPS
100.0000 mg | ORAL_CAPSULE | Freq: Two times a day (BID) | ORAL | Status: DC
  Administered 2012-04-10 – 2012-04-11 (×3): 100 mg via ORAL

## 2012-04-10 MED ORDER — ENOXAPARIN SODIUM 40 MG/0.4ML ~~LOC~~ SOLN
40.0000 mg | SUBCUTANEOUS | Status: DC
  Administered 2012-04-11 – 2012-04-13 (×3): 40 mg via SUBCUTANEOUS
  Filled 2012-04-10 (×4): qty 0.4

## 2012-04-10 MED ORDER — MORPHINE SULFATE 2 MG/ML IJ SOLN: 1.0000 mg | INTRAMUSCULAR | Status: DC | PRN

## 2012-04-10 MED ORDER — LACTATED RINGERS IV SOLN
INTRAVENOUS | Status: DC | PRN
  Administered 2012-04-10 (×3): via INTRAVENOUS

## 2012-04-10 MED ORDER — TRAMADOL HCL 50 MG PO TABS
50.0000 mg | ORAL_TABLET | Freq: Four times a day (QID) | ORAL | Status: DC | PRN
  Administered 2012-04-12: 50 mg via ORAL
  Filled 2012-04-10 (×4): qty 1

## 2012-04-10 MED ORDER — METHOCARBAMOL 100 MG/ML IJ SOLN: 500.0000 mg | Freq: Four times a day (QID) | INTRAVENOUS | Status: DC | PRN

## 2012-04-10 MED ORDER — SODIUM CHLORIDE 0.9 % IV SOLN
INTRAVENOUS | Status: DC
  Administered 2012-04-10: 22:00:00 via INTRAVENOUS

## 2012-04-10 MED ORDER — ACETAMINOPHEN 10 MG/ML IV SOLN
1000.0000 mg | Freq: Four times a day (QID) | INTRAVENOUS | Status: AC
  Administered 2012-04-10 – 2012-04-11 (×3): 1000 mg via INTRAVENOUS
  Filled 2012-04-10 (×5): qty 100

## 2012-04-10 MED ORDER — HYDROMORPHONE HCL PF 1 MG/ML IJ SOLN
0.5000 mg | INTRAMUSCULAR | Status: DC | PRN
  Administered 2012-04-10: 0.5 mg via INTRAVENOUS
  Filled 2012-04-10: qty 1

## 2012-04-10 MED ORDER — HETASTARCH-ELECTROLYTES 6 % IV SOLN
INTRAVENOUS | Status: DC | PRN
  Administered 2012-04-10: 19:00:00 via INTRAVENOUS

## 2012-04-10 MED ORDER — SODIUM CHLORIDE 0.9 % IJ SOLN
INTRAMUSCULAR | Status: DC | PRN
  Administered 2012-04-10: 50 mL

## 2012-04-10 MED ORDER — PROPOFOL 10 MG/ML IV BOLUS
INTRAVENOUS | Status: DC | PRN
  Administered 2012-04-10: 120 mg via INTRAVENOUS

## 2012-04-10 MED ORDER — OXYCODONE HCL 5 MG PO TABS
5.0000 mg | ORAL_TABLET | ORAL | Status: DC | PRN
  Filled 2012-04-10: qty 1

## 2012-04-10 MED ORDER — 0.9 % SODIUM CHLORIDE (POUR BTL) OPTIME
TOPICAL | Status: DC | PRN
  Administered 2012-04-10: 1000 mL

## 2012-04-10 MED ORDER — ONDANSETRON HCL 4 MG/2ML IJ SOLN: 4.0000 mg | Freq: Four times a day (QID) | INTRAMUSCULAR | Status: DC | PRN

## 2012-04-10 MED ORDER — MENTHOL 3 MG MT LOZG: 1.0000 | LOZENGE | OROMUCOSAL | Status: DC | PRN

## 2012-04-10 MED ORDER — BUPIVACAINE LIPOSOME 1.3 % IJ SUSP
INTRAMUSCULAR | Status: DC | PRN
  Administered 2012-04-10: 20 mL

## 2012-04-10 MED ORDER — LEVOTHYROXINE SODIUM 125 MCG PO TABS
125.0000 ug | ORAL_TABLET | Freq: Every day | ORAL | Status: DC
  Administered 2012-04-11 – 2012-04-13 (×3): 125 ug via ORAL
  Filled 2012-04-10 (×4): qty 1

## 2012-04-10 SURGICAL SUPPLY — 67 items
BAG ZIPLOCK 12X15 (MISCELLANEOUS) ×6 IMPLANT
BIT DRILL 2.8X128 (BIT) IMPLANT
BLADE EXTENDED COATED 6.5IN (ELECTRODE) ×2 IMPLANT
BLADE SAW SAG 73X25 THK (BLADE)
BLADE SAW SGTL 73X25 THK (BLADE) IMPLANT
CABLE (Orthopedic Implant) ×4 IMPLANT
CABLE FOR BONE PLATE (Cable) IMPLANT
CLOTH BEACON ORANGE TIMEOUT ST (SAFETY) ×2 IMPLANT
CONT SPECI 4OZ STER CLIK (MISCELLANEOUS) IMPLANT
DRAPE INCISE IOBAN 66X45 STRL (DRAPES) ×2 IMPLANT
DRAPE ORTHO SPLIT 77X108 STRL (DRAPES) ×2
DRAPE POUCH INSTRU U-SHP 10X18 (DRAPES) ×2 IMPLANT
DRAPE SURG ORHT 6 SPLT 77X108 (DRAPES) ×2 IMPLANT
DRAPE U-SHAPE 47X51 STRL (DRAPES) ×2 IMPLANT
DRSG ADAPTIC 3X8 NADH LF (GAUZE/BANDAGES/DRESSINGS) ×2 IMPLANT
DRSG EMULSION OIL 3X16 NADH (GAUZE/BANDAGES/DRESSINGS) IMPLANT
DRSG MEPILEX BORDER 4X12 (GAUZE/BANDAGES/DRESSINGS) ×2 IMPLANT
DRSG MEPILEX BORDER 4X4 (GAUZE/BANDAGES/DRESSINGS) IMPLANT
DRSG MEPILEX BORDER 4X8 (GAUZE/BANDAGES/DRESSINGS) IMPLANT
DURAPREP 26ML APPLICATOR (WOUND CARE) ×2 IMPLANT
ELECT REM PT RETURN 9FT ADLT (ELECTROSURGICAL) ×2
ELECTRODE REM PT RTRN 9FT ADLT (ELECTROSURGICAL) ×1 IMPLANT
EVACUATOR 1/8 PVC DRAIN (DRAIN) ×2 IMPLANT
FACESHIELD LNG OPTICON STERILE (SAFETY) ×10 IMPLANT
GAUZE SPONGE 2X2 8PLY STRL LF (GAUZE/BANDAGES/DRESSINGS) ×1 IMPLANT
GLOVE BIO SURGEON STRL SZ8 (GLOVE) ×2 IMPLANT
GLOVE BIOGEL PI IND STRL 8 (GLOVE) ×2 IMPLANT
GLOVE BIOGEL PI INDICATOR 8 (GLOVE) ×2
GLOVE ECLIPSE 8.0 STRL XLNG CF (GLOVE) ×2 IMPLANT
GLOVE SURG SS PI 6.5 STRL IVOR (GLOVE) ×4 IMPLANT
GOWN STRL NON-REIN LRG LVL3 (GOWN DISPOSABLE) ×6 IMPLANT
GOWN STRL REIN XL XLG (GOWN DISPOSABLE) ×4 IMPLANT
GUIDEWIRE BALL NOSE 80CM (WIRE) ×2 IMPLANT
HEAD FEMORAL SROM 32 PLUS 0 (Hips) ×1 IMPLANT
IMMOBILIZER KNEE 20 (SOFTGOODS)
IMMOBILIZER KNEE 20 THIGH 36 (SOFTGOODS) IMPLANT
KIT BASIN OR (CUSTOM PROCEDURE TRAY) ×2 IMPLANT
LINER MARATHON 32 50 (Hips) ×2 IMPLANT
MANIFOLD NEPTUNE II (INSTRUMENTS) ×2 IMPLANT
NDL SAFETY ECLIPSE 18X1.5 (NEEDLE) ×1 IMPLANT
NEEDLE HYPO 18GX1.5 SHARP (NEEDLE) ×1
NS IRRIG 1000ML POUR BTL (IV SOLUTION) ×2 IMPLANT
PACK TOTAL JOINT (CUSTOM PROCEDURE TRAY) ×2 IMPLANT
PASSER SUT SWANSON 36MM LOOP (INSTRUMENTS) IMPLANT
POSITIONER SURGICAL ARM (MISCELLANEOUS) ×2 IMPLANT
S-Rom total hip system Femoral Stem Right Long 20x (Stem) ×2 IMPLANT
SLEEVE FEM PROX 20F LRG (Hips) ×2 IMPLANT
SPONGE GAUZE 2X2 STER 10/PKG (GAUZE/BANDAGES/DRESSINGS) ×1
SPONGE GAUZE 4X4 12PLY (GAUZE/BANDAGES/DRESSINGS) IMPLANT
SPONGE LAP 18X18 X RAY DECT (DISPOSABLE) IMPLANT
SROM FEMORAL HEAD 32 PLUS 0 (Hips) ×2 IMPLANT
SROM STM LNG 36P 8L 20X15R ×2 IMPLANT
STAPLER VISISTAT 35W (STAPLE) IMPLANT
SUCTION FRAZIER TIP 10 FR DISP (SUCTIONS) ×2 IMPLANT
SUT ETHIBOND NAB CT1 #1 30IN (SUTURE) ×4 IMPLANT
SUT MNCRL AB 4-0 PS2 18 (SUTURE) ×4 IMPLANT
SUT VIC AB 1 CT1 27 (SUTURE) ×2
SUT VIC AB 1 CT1 27XBRD ANTBC (SUTURE) ×2 IMPLANT
SUT VIC AB 2-0 CT1 27 (SUTURE) ×4
SUT VIC AB 2-0 CT1 TAPERPNT 27 (SUTURE) ×4 IMPLANT
SUT VLOC 180 0 24IN GS25 (SUTURE) ×4 IMPLANT
SWAB COLLECTION DEVICE MRSA (MISCELLANEOUS) IMPLANT
SYR 50ML LL SCALE MARK (SYRINGE) ×2 IMPLANT
TOWEL OR 17X26 10 PK STRL BLUE (TOWEL DISPOSABLE) ×4 IMPLANT
TRAY FOLEY CATH 14FRSI W/METER (CATHETERS) ×2 IMPLANT
TUBE ANAEROBIC SPECIMEN COL (MISCELLANEOUS) IMPLANT
WATER STERILE IRR 1500ML POUR (IV SOLUTION) ×4 IMPLANT

## 2012-04-10 NOTE — Brief Op Note (Signed)
04/10/2012  8:53 PM  PATIENT:  [name]  78 y.o. female  PRE-OPERATIVE DIAGNOSIS:  right periprosthetic femur fracture  POST-OPERATIVE DIAGNOSIS:  right periprosthetic femur fracture  PROCEDURE:  Procedure(s) (LRB) with comments: TOTAL HIP REVISION (Right)  SURGEON:  Surgeon(s) and Role:    * Loanne Drilling, MD - Primary  PHYSICIAN ASSISTANT:   ASSISTANTS: Leilani Able, PA-C   ANESTHESIA:   general  EBL:  Total I/O In: 1500 [I.V.:1000; IV Piggyback:500] Out: 650 [Blood:650]  BLOOD ADMINISTERED:none  DRAINS: (Medium) Hemovact drain(s) in the right thigh with  Suction Open   LOCAL MEDICATIONS USED:  OTHER Exparel 20ml  COUNTS:  YES  TOURNIQUET:  * No tourniquets in log *  DICTATION: .Other Dictation: Dictation Number [id number]  PLAN OF CARE: Admit to inpatient   PATIENT DISPOSITION:  PACU - hemodynamically stable.

## 2012-04-10 NOTE — Preoperative (Signed)
Beta Blockers   Reason not to administer Beta Blockers:Not Applicable 

## 2012-04-10 NOTE — H&P (Signed)
Patient ID: [name] MRN: [id number] DOB/AGE: July 10, 1931 78 y.o.  Admit date: 04/10/2012  Chief Complaint:  Right hip and thigh pain.  Subjective: Patient is admitted for right hip and thigh pain. Patient is a 78 y.o. female who has been seen by Dr. Lequita Halt for ongoing hip pain.  They have been followed and found to have continued progressive pain.  X-rays show that the patient has a periprosthetic femur fracture at the tip of the femoral stem and it is through the previous fractured area.  It is felt that they would benefit from undergoing surgical intervention.   She just recently underwent removal of previous hardware secondary to irritation this past Friday on 04/05/2012.  She had a previous ORIF of a right periprosthetic femur fracture on 07/31/11 and healed the fracture uneventfully with radiographic union of the fracture site.  She has developed, in the past 2-3 months, lateral pain with lying on her right side and with ambulation. She presented for hardware removal this past Friday and underwent the procedure.  She was released on Saturday after walking around the hallways without difficulty.  When she went to get dressed before leaving the hospital, she sat down and felt and heard a click in her thigh.  Her swelling started in increase over the weekend and called into the office on Tuesday.  She was told to come into the office today where x-rays showed the patient to have fractured through the previous site.  Dr. Lequita Halt was notified and the patient was admitted to the hospital for further intervention.   Allergies: Allergies  Allergen Reactions  . Epinephrine (Epinephrine) Palpitations     Medications: Prescriptions prior to admission  Medication Sig Dispense Refill  . acetaminophen (TYLENOL) 500 MG tablet Take 500 mg by mouth every 6 (six) hours as needed. Pain      . amLODipine (NORVASC) 5 MG tablet Take 5 mg by mouth daily before breakfast.       . aspirin EC 81 MG tablet  Take 81 mg by mouth daily.      . calcium-vitamin D (OSCAL WITH D) 500-200 MG-UNIT per tablet Take 1 tablet by mouth 2 (two) times daily.       . cholecalciferol (VITAMIN D) 1000 UNITS tablet Take 1,000 Units by mouth daily.      . diclofenac sodium (VOLTAREN) 1 % GEL Apply 2 g topically as needed.      . fish oil-omega-3 fatty acids 1000 MG capsule Take 1 g by mouth daily.      Marland Kitchen HYDROcodone-acetaminophen (NORCO) 5-325 MG per tablet Take 1-2 tablets by mouth every 6 (six) hours as needed for pain.  50 tablet  1  . levothyroxine (SYNTHROID, LEVOTHROID) 125 MCG tablet Take 125 mcg by mouth daily before breakfast.      . methocarbamol (ROBAXIN) 500 MG tablet Take 1 tablet (500 mg total) by mouth 4 (four) times daily.  30 tablet  1  . Multiple Vitamins-Minerals (MULTIVITAMINS THER. W/MINERALS) TABS Take 1 tablet by mouth daily.      . naproxen sodium (ANAPROX) 220 MG tablet Take 220 mg by mouth 2 (two) times daily with a meal.        Past Medical History: Past Medical History  Diagnosis Date  . Hypothyroidism 3-21- 13    tx. levothyroxine  . Blood transfusion 07-27-11    post surgery some time ago  . Cancer 07-27-11    Breast cancer -s/p lt. mastectomy, some squamous cell lesions  .  Complication of anesthesia     epinephrine - screams uncontrollably   . Arthritis 07-27-11    osteoarthritis-hips/s/p bil. THA, arthritis right hand   . Anemia     hx of with surgery      Past Surgical History: Past Surgical History  Procedure Date  . Abdominal hysterectomy   . Ankle surgery 07-27-11    right ankle tendon repair  . Mastectomy 07-27-11    left  . Cataract extraction, bilateral 07-27-11    Bilateral  . Squamous cell carcinoma excision 07-27-11    x2 left shin  . Joint replacement 07-27-11    Bilateral: Right THA - 04/2000, Left THA 06/2001  . Breast surgery 07-27-11    lt.breast cancer intraductal cancer, mastectomy  . Orif femur fracture 07/31/2011    Procedure: OPEN REDUCTION INTERNAL  FIXATION (ORIF) DISTAL FEMUR FRACTURE;  Surgeon: Loanne Drilling, MD;  Location: [facility];  Service: Orthopedics;  Laterality: Right;  right femur  (c-arm)   . Hardware removal 04/05/2012    Procedure: HARDWARE REMOVAL;  Surgeon: Loanne Drilling, MD;  Location: [facility];  Service: Orthopedics;  Laterality: Right;  Removal of Hardware Right Femur      Family History: Family History  Problem Relation Age of Onset  . Cancer Mother   . Cancer Father     Social History: History  Substance Use Topics  . Smoking status: Former Smoker -- 10 years    Quit date: 07/26/1952  . Smokeless tobacco: Never Used  . Alcohol Use: 0.6 oz/week    1 Glasses of wine per week     Comment: wine daily with supper     Review of Systems General: No chills, fevers, night sweats, fatigue. Neuro:  No seizures, syncope, paralysis, visual problems. Respiratory:  No shortness of breath, productive cough, hemoptysis. Cardiovascular: No chest pain, angina, palpitations, orthopnea. GI: No nausea, vomiting, diarrhea, constipation. GU:  No dysuria, hematuria, discharge. Musculoskeletal:  Thigh pain upon movement  LABS: Pending - to be drawn on admission  Vitals:   Physical Exam: GENERAL: Patient is a 78 y.o. female, well-nourished, well-developed, no acute distress. Alert, oriented, cooperative. HENT:  Normocephalic, atraumatic. Pupils round and reactive. EOMs intact. NECK:  Supple, no bruits. CHEST:  Clear to anterior and posterior chest walls. No rhonchi, rales, wheezes. HEART:  Regular, rate and rhythm.  No murmurs.  S1 and S2 noted. ABDOMEN:  Soft, nontender, bowel sounds present. RECTAL/BREAST/GENITALIA:  Not done, not pertinent to present illness. EXTREMITIES:  Obvious deformity proximal right thigh.  Motor intact - moving foot and toes well on exam.  Good pulses noted distally.  Guarding on exam and unable to get a good knee or hip exam due to pain.  Minimal discomfort noted on hip roll.   Recent  Previous LABS: Results for orders placed during the hospital encounter of 04/02/12  SURGICAL PCR SCREEN      Component Value Range   MRSA, PCR NEGATIVE  NEGATIVE   Staphylococcus aureus NEGATIVE  NEGATIVE  CBC      Component Value Range   WBC 6.5  4.0 - 10.5 K/uL   RBC 4.54  3.87 - 5.11 MIL/uL   Hemoglobin 12.9  12.0 - 15.0 g/dL   HCT 78.4  69.6 - 29.5 %   MCV 85.7  78.0 - 100.0 fL   MCH 28.4  26.0 - 34.0 pg   MCHC 33.2  30.0 - 36.0 g/dL   RDW 28.4  13.2 - 44.0 %   Platelets  270  150 - 400 K/uL  BASIC METABOLIC PANEL      Component Value Range   Sodium 136  135 - 145 mEq/L   Potassium 5.1  3.5 - 5.1 mEq/L   Chloride 100  96 - 112 mEq/L   CO2 29  19 - 32 mEq/L   Glucose, Bld 74  70 - 99 mg/dL   BUN 14  6 - 23 mg/dL   Creatinine, Ser 7.82  0.50 - 1.10 mg/dL   Calcium 9.9  8.4 - 95.6 mg/dL   GFR calc non Af Amer 85 (*) >90 mL/min   GFR calc Af Amer >90  >90 mL/min   No results found for this basename: HGB:5 in the last 72 hours No results found for this basename: WBC:2,RBC:2,HCT:2,PLT:2 in the last 72 hours No results found for this basename: NA:2,K:2,CL:2,CO2:2,BUN:2,CREATININE:2,GLUCOSE:2,CALCIUM:2 in the last 72 hours No results found for this basename: LABPT:2,INR:2 in the last 72 hours  Assessment/Plan: Right Midshaft Periprosthetic Angulated Femur Fracture.  The patient is being admitted to [facility] for a Right Midshaft Periprosthetic Angulated Femur Fracture.  Surgery will be determined by Dr. Ollen Gross.  Will place at bedrest and non-weight bearing at this time.  Avel Peace, PA-C I have seen and examined the patient and agree with above. She has a periprosthetic femur fracture which will require ORIF with femoral revision. We discussed the procedure risks and potential complications and she elects to proceed.

## 2012-04-10 NOTE — Anesthesia Preprocedure Evaluation (Addendum)
Anesthesia Evaluation  Patient identified by MRN, date of birth, ID band Patient awake    Reviewed: Allergy & Precautions, H&P , NPO status , Patient's Chart, lab work & pertinent test results  Airway Mallampati: II TM Distance: >3 FB Neck ROM: full    Dental No notable dental hx. (+) Caps, Dental Advisory Given and Teeth Intact,    Pulmonary neg pulmonary ROS,  breath sounds clear to auscultation  Pulmonary exam normal       Cardiovascular Exercise Tolerance: Good hypertension, Pt. on medications Rhythm:regular Rate:Normal     Neuro/Psych negative neurological ROS  negative psych ROS   GI/Hepatic negative GI ROS, Neg liver ROS,   Endo/Other  Hypothyroidism   Renal/GU negative Renal ROS  negative genitourinary   Musculoskeletal   Abdominal   Peds  Hematology negative hematology ROS (+)   Anesthesia Other Findings   Reproductive/Obstetrics negative OB ROS                           Anesthesia Physical Anesthesia Plan  ASA: II  Anesthesia Plan: General   Post-op Pain Management:    Induction: Intravenous  Airway Management Planned: Oral ETT  Additional Equipment:   Intra-op Plan:   Post-operative Plan: Extubation in OR  Informed Consent: I have reviewed the patients History and Physical, chart, labs and discussed the procedure including the risks, benefits and alternatives for the proposed anesthesia with the patient or authorized representative who has indicated his/her understanding and acceptance.   Dental advisory given  Plan Discussed with: CRNA  Anesthesia Plan Comments:         Anesthesia Quick Evaluation

## 2012-04-10 NOTE — Transfer of Care (Signed)
Immediate Anesthesia Transfer of Care Note  Patient: [name]  Procedure(s) Performed: Procedure(s) (LRB) with comments: TOTAL HIP REVISION (Right)  Patient Location: PACU  Anesthesia Type:General  Level of Consciousness: awake, alert , oriented and patient cooperative  Airway & Oxygen Therapy: Patient Spontanous Breathing and Patient connected to face mask oxygen  Post-op Assessment: Report given to PACU RN and Post -op Vital signs reviewed and stable  Post vital signs: Reviewed and stable  Complications: No apparent anesthesia complications

## 2012-04-10 NOTE — Interval H&P Note (Signed)
History and Physical Interval Note:  04/10/2012 5:07 PM  [name]  has presented today for surgery, with the diagnosis of right periprosthetic femur fracture  The various methods of treatment have been discussed with the patient and family. After consideration of risks, benefits and other options for treatment, the patient has consented to  Procedure(s) (LRB) with comments: TOTAL HIP REVISION (Right) as a surgical intervention .  The patient's history has been reviewed, patient examined, no change in status, stable for surgery.  I have reviewed the patient's chart and labs.  Questions were answered to the patient's satisfaction.     Loanne Drilling

## 2012-04-10 NOTE — Progress Notes (Signed)
Patient blood pressure 201/87. Patient does not c/o any symptoms except mild right hip pain. Ellwood Dense PA,notified. Orders given.

## 2012-04-10 NOTE — Anesthesia Postprocedure Evaluation (Signed)
Anesthesia Post Note  Patient: [name]  Procedure(s) Performed: Procedure(s) (LRB): TOTAL HIP REVISION (Right)  Anesthesia type: General  Patient location: PACU  Post pain: Pain level controlled  Post assessment: Post-op Vital signs reviewed  Last Vitals:  Filed Vitals:   04/10/12 2115  BP: 164/81  Pulse: 77  Temp:   Resp: 12    Post vital signs: Reviewed  Level of consciousness: sedated  Complications: No apparent anesthesia complications

## 2012-04-11 LAB — BASIC METABOLIC PANEL WITH GFR
BUN: 12 mg/dL (ref 6–23)
CO2: 27 meq/L (ref 19–32)
Calcium: 8 mg/dL — ABNORMAL LOW (ref 8.4–10.5)
Chloride: 100 meq/L (ref 96–112)
Creatinine, Ser: 0.62 mg/dL (ref 0.50–1.10)
GFR calc Af Amer: >90 mL/min
GFR calc non Af Amer: 84 mL/min — ABNORMAL LOW
Glucose, Bld: 144 mg/dL — ABNORMAL HIGH (ref 70–99)
Potassium: 4.7 meq/L (ref 3.5–5.1)
Sodium: 132 meq/L — ABNORMAL LOW (ref 135–145)

## 2012-04-11 LAB — CBC
HCT: 22 % — ABNORMAL LOW (ref 36.0–46.0)
Hemoglobin: 7.6 g/dL — ABNORMAL LOW (ref 12.0–15.0)
MCHC: 34.5 g/dL (ref 30.0–36.0)
RBC: 2.62 MIL/uL — ABNORMAL LOW (ref 3.87–5.11)
WBC: 10.5 10*3/uL (ref 4.0–10.5)

## 2012-04-11 LAB — PREPARE RBC (CROSSMATCH)

## 2012-04-11 NOTE — Progress Notes (Signed)
OT Cancellation Note  Patient Details Name: JAYMEE TILSON MRN: [id number] DOB: 01/31/1932   Cancelled Treatment:    Reason Eval/Treat Not Completed: Medical issues which prohibited therapy. Pt waiting for blood. Will re-attempt as able.  Roselie Awkward Dixon 04/11/2012, 12:24 PM

## 2012-04-11 NOTE — Evaluation (Signed)
Physical Therapy Evaluation Patient Details Name: [name] MRN: [id number] DOB: January 26, 1932 Today's Date: 04/11/2012 Time: 4098-1191 PT Time Calculation (min): 25 min  PT Assessment / Plan / Recommendation Clinical Impression  Pt s/p R THA revision due to periprosthetic femur fx.  Pt s/p one unit of PRBCs due to low hemoglobin post op.  Pt would benefit from acute PT services in order to improve independence with transfers and ambulation within precautions to prepare for safe d/c home alone with family and friends to assist as needed.    PT Assessment  Patient needs continued PT services    Follow Up Recommendations  Home health PT    Does the patient have the potential to tolerate intense rehabilitation      Barriers to Discharge        Equipment Recommendations  None recommended by PT    Recommendations for Other Services     Frequency 7X/week    Precautions / Restrictions Precautions Precautions: Posterior Hip Restrictions RLE Weight Bearing: Weight bearing as tolerated   Pertinent Vitals/Pain 2/10 R hip, pt reports she took tylenol just prior to therapy and that is all she needs at this time      Mobility  Bed Mobility Bed Mobility: Supine to Sit;Sit to Supine Supine to Sit: 5: Supervision;HOB flat Sit to Supine: HOB flat;4: Min assist Details for Bed Mobility Assistance: verbal cues for safe technique within precautions, pt used L LE to assist R as well as UEs Transfers Transfers: Stand to Sit;Sit to Stand Sit to Stand: 5: Supervision;With upper extremity assist;From bed Stand to Sit: 5: Supervision;With upper extremity assist;To bed Details for Transfer Assistance: verbal cues for safe technique within precautions Ambulation/Gait Ambulation/Gait Assistance: 5: Supervision Ambulation Distance (Feet): 140 Feet Assistive device: Rolling walker Ambulation/Gait Assistance Details: pt able to recall correct sequence, pt also stated she was glad her foot was  straight this time, verbal cue to turn toward L LE Gait Pattern: Step-through pattern Gait velocity: decreased    Shoulder Instructions     Exercises Total Joint Exercises Ankle Circles/Pumps: AROM;5 reps;Both Quad Sets: AROM;5 reps;Both Towel Squeeze: AROM;Both;15 reps Short Arc Quad: AROM;5 reps;Right Heel Slides: Right;15 reps;AAROM Hip ABduction/ADduction: AROM;Right;15 reps Straight Leg Raises: 10 reps;AAROM;Right   PT Diagnosis: Difficulty walking;Acute pain  PT Problem List: Decreased strength;Decreased range of motion;Decreased mobility;Pain;Decreased knowledge of use of DME;Decreased knowledge of precautions PT Treatment Interventions: DME instruction;Gait training;Functional mobility training;Therapeutic activities;Therapeutic exercise;Patient/family education   PT Goals Acute Rehab PT Goals PT Goal Formulation: With patient Time For Goal Achievement: 04/15/12 Potential to Achieve Goals: Good Pt will go Supine/Side to Sit: with modified independence PT Goal: Supine/Side to Sit - Progress: Goal set today Pt will go Sit to Stand: with modified independence PT Goal: Sit to Stand - Progress: Goal set today Pt will go Stand to Sit: with modified independence PT Goal: Stand to Sit - Progress: Goal set today Pt will Ambulate: 51 - 150 feet;with modified independence;with least restrictive assistive device PT Goal: Ambulate - Progress: Goal set today Pt will Perform Home Exercise Program: with supervision, verbal cues required/provided PT Goal: Perform Home Exercise Program - Progress: Goal set today  Visit Information  Last PT Received On: 04/11/12 Assistance Needed: +1    Subjective Data  Subjective: I'm so glad I can walk on it now. Patient Stated Goal: hiking and walking   Prior Functioning  Home Living Lives With: Alone Available Help at Discharge: Friend(s);Family Type of Home: House Home Access: Level entry  Home Layout: One level Home Adaptive Equipment:  Walker - rolling;Straight cane;Shower chair without back;Bedside commode/3-in-1 Prior Function Level of Independence: Independent with assistive device(s) Able to Take Stairs?: Yes Driving: Yes Communication Communication: No difficulties    Cognition  Overall Cognitive Status: Appears within functional limits for tasks assessed/performed Arousal/Alertness: Awake/alert Orientation Level: Appears intact for tasks assessed Behavior During Session: [facility] for tasks performed    Extremity/Trunk Assessment Right Upper Extremity Assessment RUE ROM/Strength/Tone: [facility] for tasks assessed Left Upper Extremity Assessment LUE ROM/Strength/Tone: WFL for tasks assessed Right Lower Extremity Assessment RLE ROM/Strength/Tone: Deficits RLE ROM/Strength/Tone Deficits: withing precautions grossly 3/5 except hip flexion 3-/5 Left Lower Extremity Assessment LLE ROM/Strength/Tone: WFL for tasks assessed   Balance    End of Session PT - End of Session Activity Tolerance: Patient tolerated treatment well Patient left: in bed;with call bell/phone within reach  GP     Chatara Lucente,KATHrine E 04/11/2012, 3:20 PM Pager: [phone]

## 2012-04-11 NOTE — Progress Notes (Signed)
   Subjective: 1 Day Post-Op Procedure(s) (LRB): TOTAL HIP REVISION (Right) Patient reports pain as mild and moderate.   Patient seen in rounds by Dr. Lequita Halt.  HGB down to 7.6 this morning.  Will give her blood today. Patient is well, and has had no acute complaints or problems We will start therapy today.  Patient may be WBAT.  Objective: Vital signs in last 24 hours: Temp:  [97.7 F (36.5 C)-98.6 F (37 C)] 98.6 F (37 C) (12/05 0447) Pulse Rate:  [65-81] 65  (12/05 0447) Resp:  [12-15] 15  (12/05 0447) BP: (122-201)/(63-88) 145/63 mmHg (12/05 0447) SpO2:  [97 %-100 %] 100 % (12/05 0447) Weight:  [55.792 kg (123 lb)] 55.792 kg (123 lb) (12/04 1600)  Intake/Output from previous day:  Intake/Output Summary (Last 24 hours) at 04/11/12 0746 Last data filed at 04/11/12 1610  Gross per 24 hour  Intake 4426.25 ml  Output   3425 ml  Net 1001.25 ml    Intake/Output this shift: UOP 900 +1001  Labs:  [facility] 04/11/12 0429 04/10/12 1330  HGB 7.6* 11.5*    Basename 04/11/12 0429 04/10/12 1330  WBC 10.5 7.8  RBC 2.62* 4.04  HCT 22.0* 34.2*  PLT 197 267    Basename 04/11/12 0429 04/10/12 1330  NA 132* 135  K 4.7 4.7  CL 100 97  CO2 27 31  BUN 12 11  CREATININE 0.62 0.62  GLUCOSE 144* 92  CALCIUM 8.0* 10.0    Basename 04/10/12 1330  LABPT --  INR 0.92    EXAM General - Patient is Alert, Appropriate and Oriented Extremity - Neurovascular intact Sensation intact distally Dorsiflexion/Plantar flexion intact Dressing - dressing C/D/I Motor Function - intact, moving foot and toes well on exam.  Hemovac pulled without difficulty.  Past Medical History  Diagnosis Date  . Hypothyroidism 3-21- 13    tx. levothyroxine  . Blood transfusion 07-27-11    post surgery some time ago  . Cancer 07-27-11    Breast cancer -s/p lt. mastectomy, some squamous cell lesions  . Complication of anesthesia     epinephrine - screams uncontrollably   . Arthritis 07-27-11   osteoarthritis-hips/s/p bil. THA, arthritis right hand   . Anemia     hx of with surgery     Assessment/Plan: 1 Day Post-Op Procedure(s) (LRB): TOTAL HIP REVISION (Right) Principal Problem:  *Peri-prosthetic fracture of femur following total hip arthroplasty Active Problems:  PostopHyponatremia  Postop Acute blood loss anemia  Postop Transfusion  Estimated Body mass index is 20.47 kg/(m^2) as calculated from the following:   Height as of this encounter: 5\' 5" (1.651 m).   Weight as of this encounter: 123 lb(55.792 kg). Advance diet Up with therapy  DVT Prophylaxis - Lovenox Weight Bearing As Tolerated right Leg Hemovac Pulled Begin Therapy No vaccines.  Matteo Banke 04/11/2012, 7:46 AM

## 2012-04-11 NOTE — Op Note (Signed)
[name], [name] NO.:  [id number]  MEDICAL RECORD NO.:  [id number]  LOCATION:  1614                         FACILITY:  [facility]  PHYSICIAN:  Ollen Gross, M.D.    DATE OF BIRTH:  August 08, 1931  DATE OF PROCEDURE:  04/10/2012 DATE OF DISCHARGE:                              OPERATIVE REPORT   PREOPERATIVE DIAGNOSIS:  Right periprosthetic femur fracture.  POSTOPERATIVE DIAGNOSIS:  Right periprosthetic femur fracture.  PROCEDURE:  Right total hip arthroplasty revision with fixation of periprosthetic femur fracture.  SURGEON:  Ollen Gross, MD  ASSISTANT:  Jaquelyn Bitter. Chabon, PA-C  ANESTHESIA:  General.  ESTIMATED BLOOD LOSS:  800.  DRAINS:  Hemovac x1.  COMPLICATIONS:  None.  CONDITION:  Stable to recovery.  BRIEF CLINICAL NOTE:  Karlisa is a 78 year old female, who had a right total hip arthroplasty over 10 years ago.  Did fine until last year when she was noted to have a periprosthetic fracture.  She subsequently had plating of this fracture after failure of nonoperative management.  She apparently healed the fracture and the plate was removed last week.  She torqued to her leg and felt a pop.  She did not have severe pain but was unable to bear weight.  She was seen in the office this morning, noted to have a refracture through the femur at this time with angulation and slight displacement.  She was admitted to the hospital and presents now for total hip revision with fixation of the fracture.  PROCEDURE IN DETAIL:  After successful administration of general anesthetic, the patient was placed in the left lateral decubitus position with the right side up and held with the hip positioner.  Her previous posterolateral incisions were utilized.  Skin cut with a 10 blade through subcutaneous tissue to the fascia lata which was incised in line with the skin incision.  I elected tibial proximal first.  The sciatic nerve was palpated and protected and the  posterior pseudocapsule was incised off of the femur.  The hip was identified and with a bone hook dislocation was performed.  I removed the femoral head.  The stem was not grossly loose but it was a polished cemented stem and thus I was able to use the loop extractor to extract it from the cement mantle.  I was able to remove a small portion of cement proximally.  I then exposed the fracture site and separated out the distal and proximal fragment.  We did minimal soft tissue stripping off the edges. Using [place] osteotomes, I removed the cement from the distal fragment and removed the cement restrictor.  Using the thin shaft flexible reamers, I then reamed up to 16.5 mm for placement of a 20 x 15 S-ROM stem.  We then retrograde removed the rest of the cement from the proximal fragment.  There was a small linear crack in the femur and we placed 2 cables around that after I had passed the trial stem.  We then reamed proximally with a thin shaft reamers up to 16.5 mm.  Did a proximal reaming for the S-ROM up to 26F and machined the sleeve to a large trial. A 26F large sleeve  was placed.  A trial 20 x 15 long right __________ stem with a 36+ 8 neck was then impacted to the fracture site.  Fracture was reduced and it was passed across the fracture.  We actually had excellent purchase in the distal fragment and the femur was moving as a solitary unit.  The trial 28+ 6 femoral head was placed and the hips reduced and she had leg lengths maintained.  Says the polyethylene was in over 10 years.  We decided I was going to exchange polyethylene up to a 32 head for better stability.  This is the time I also placed 2 cables around the proximal portion of the femur with a linear split was to prevent this from propagating.  I then removed the trial stem.  Femurs retracted anteriorly to gain acetabular exposure.  The acetabular polyethylene was removed from the 50 mm pinnacle acetabular  shell. Shell was in excellent position, was well fixed.  We up sized from a 28 mm internal diameter with a 32 mm liner.  This was impacted and it was found to be locked into the shell.  I then placed the S-ROM sleeve, 20 F large into the proximal femur.  The 20 x 15 stem with 36+ 8 neck was then placed.  This was a long stem 225 mm right-sided with the bow.  I placed the anteversion and I anticipated and impacted the stem to the fracture site.  I then reduced the fracture and passed the stem across the fracture and impacted it all the way down.  The purchase was outstanding in the distal fragment and the fracture was anatomically reduced and the femur was rotating as a solitary unit.  I trialed with 32+ 0 head.  The hip reduced with excellent stability, full extension, full external rotation, 70 degrees flexion, 40 degrees adduction, 90 degrees internal rotation, and 90 degrees of flexion and 70 degrees of internal rotation.  I placed the right leg on top of the left and leg lengths were equal.  I then took AP and lateral x-rays in the operating room to confirm that the stem was indeed in the distal fragment and no fractures had occurred.  I was very pleased with the positioning of the stem and there were no complications from the placement of this long stem.  Wound was then copiously irrigated with saline solution.  The fascia of the vastus lateralis was closed with running #1 Vicryl and then the posterior tissue was reattached to the femur through drill holes with Ethibond suture to the level of the greater trochanter.  The fascia lata was then closed with a running #1 V-Loc over Hemovac drain. Subcu closed with interrupted 2-0 Vicryl and subcuticular running 4-0 Monocryl.  The drain was hooked to suction.  Prior to this closure, I placed a total of 20 mL of Exparel mixed with 50 mL of saline into the fascia lata muscle and subcu tissues.  Once we had the skin closed, then the incisions  were cleaned and dried and Steri-Strips and a bulky sterile dressing applied.  She was then placed into a knee immobilizer, awakened and transported to recovery in stable condition.  Please note that a surgical assistant was a medical necessity for this procedure in order to perform it in a safe and expeditious manner. Surgical assistant was necessary for exposure and reduction of fracture, protection of vital neurovascular structures and proper placement of the implants.  Essentially the leg had to be held in the  exact proper position to place this implant correctly across the fracture.     Ollen Gross, M.D.     FA/MEDQ  D:  04/10/2012  T:  04/11/2012  Job:  [id number]

## 2012-04-12 ENCOUNTER — Encounter (HOSPITAL_COMMUNITY): Payer: Self-pay | Admitting: Orthopedic Surgery

## 2012-04-12 LAB — CBC
Platelets: 181 10*3/uL (ref 150–400)
RDW: 14.1 % (ref 11.5–15.5)
WBC: 8.3 10*3/uL (ref 4.0–10.5)

## 2012-04-12 LAB — BASIC METABOLIC PANEL
Chloride: 102 mEq/L (ref 96–112)
Creatinine, Ser: 0.64 mg/dL (ref 0.50–1.10)
GFR calc Af Amer: >90 mL/min (ref >90)
Potassium: 3.9 mEq/L (ref 3.5–5.1)

## 2012-04-12 MED ORDER — ALUM & MAG HYDROXIDE-SIMETH 200-200-20 MG/5ML PO SUSP
30.0000 mL | ORAL | Status: DC | PRN
  Administered 2012-04-12 (×2): 30 mL via ORAL
  Filled 2012-04-12 (×2): qty 30

## 2012-04-12 NOTE — Progress Notes (Signed)
   Subjective: 2 Days Post-Op Procedure(s) (LRB): TOTAL HIP REVISION (Right) Patient reports pain as mild.   Patient seen in rounds for Dr. Lequita Halt.  Patient states the she is very tired and worn out from yesterday.  She denies lightheadedness or dizziness this morning or yesterday when she got up.  Will see how she does when she gets up today.  HGB is 8.6 and up from &.6 yesterday after blood.  If symptoms develop, then more blood today.  Will monitor progress. Patient is having problems with fatigue Plan is to go Home after hospital stay.  Objective: Vital signs in last 24 hours: Temp:  [97.4 F (36.3 C)-98.9 F (37.2 C)] 97.4 F (36.3 C) (12/06 0516) Pulse Rate:  [60-72] 60  (12/06 0516) Resp:  [15-16] 16  (12/06 0516) BP: (104-155)/(57-77) 145/67 mmHg (12/06 0516) SpO2:  [94 %-100 %] 94 % (12/06 0516)  Intake/Output from previous day:  Intake/Output Summary (Last 24 hours) at 04/12/12 0821 Last data filed at 04/11/12 1845  Gross per 24 hour  Intake 846.25 ml  Output   1150 ml  Net -303.75 ml    Intake/Output this shift:    Labs:  Basename 04/12/12 0434 04/11/12 0429 04/10/12 1330  HGB 8.6* 7.6* 11.5*    Basename 04/12/12 0434 04/11/12 0429  WBC 8.3 10.5  RBC 2.97* 2.62*  HCT 25.1* 22.0*  PLT 181 197    Basename 04/12/12 0434 04/11/12 0429  NA 133* 132*  K 3.9 4.7  CL 102 100  CO2 28 27  BUN 16 12  CREATININE 0.64 0.62  GLUCOSE 94 144*  CALCIUM 7.9* 8.0*    Basename 04/10/12 1330  LABPT --  INR 0.92    EXAM General - Patient is Alert, Appropriate and Oriented Extremity - Neurovascular intact Sensation intact distally Dorsiflexion/Plantar flexion intact No cellulitis present Dressing/Incision - clean, dry, no drainage, healing Motor Function - intact, moving foot and toes well on exam.   Past Medical History  Diagnosis Date  . Hypothyroidism 3-21- 13    tx. levothyroxine  . Blood transfusion 07-27-11    post surgery some time ago  . Cancer  07-27-11    Breast cancer -s/p lt. mastectomy, some squamous cell lesions  . Complication of anesthesia     epinephrine - screams uncontrollably   . Arthritis 07-27-11    osteoarthritis-hips/s/p bil. THA, arthritis right hand   . Anemia     hx of with surgery     Assessment/Plan: 2 Days Post-Op Procedure(s) (LRB): TOTAL HIP REVISION (Right) Principal Problem:  *Peri-prosthetic fracture of femur following total hip arthroplasty Active Problems:  PostopHyponatremia  Postop Acute blood loss anemia  Postop Transfusion  Estimated Body mass index is 20.47 kg/(m^2) as calculated from the following:   Height as of this encounter: 5\' 5" (1.651 m).   Weight as of this encounter: 123 lb(55.792 kg). Up with therapy Plan for discharge tomorrow  DVT Prophylaxis - Lovenox Weight Bearing As Tolerated right Leg  PERKINS, ALEXZANDREW 04/12/2012, 8:21 AM

## 2012-04-12 NOTE — Progress Notes (Signed)
CSW consulted due to femur fx. Pt plans to return home with [facility] services and support from family and friends. RNCM will assist with d/c planning needs.   Cori Razor LCSW [phone]

## 2012-04-12 NOTE — Progress Notes (Signed)
Physical Therapy Treatment Patient Details Name: [name] MRN: [id number] DOB: 24-Mar-1932 Today's Date: 04/12/2012 Time: 4098-1191 PT Time Calculation (min): 28 min  PT Assessment / Plan / Recommendation Comments on Treatment Session  Pt ambulated in hallway and performed exercises.  Pt reports feeling very tired today.    Follow Up Recommendations  Home health PT     Does the patient have the potential to tolerate intense rehabilitation     Barriers to Discharge        Equipment Recommendations  None recommended by PT    Recommendations for Other Services    Frequency     Plan Discharge plan remains appropriate;Frequency remains appropriate    Precautions / Restrictions Precautions Precautions: Posterior Hip Restrictions RLE Weight Bearing: Weight bearing as tolerated   Pertinent Vitals/Pain 3/10 R hip, premedicated, repositioned    Mobility  Bed Mobility Bed Mobility: Supine to Sit Supine to Sit: 5: Supervision;With rails Details for Bed Mobility Assistance: verbal cues for safe technique within precautions, pt used L LE to assist R  Transfers Transfers: Stand to Sit;Sit to Stand Sit to Stand: 5: Supervision;With upper extremity assist;From bed;From chair/3-in-1 Stand to Sit: 5: Supervision;With upper extremity assist;To chair/3-in-1 Details for Transfer Assistance: verbal cues for safe technique within precautions, backing all the way up to chair Ambulation/Gait Ambulation/Gait Assistance: 5: Supervision Ambulation Distance (Feet): 180 Feet Assistive device: Rolling walker Ambulation/Gait Assistance Details: pt doing well with ambulation, able to recall turning towards L side Gait Pattern: Step-through pattern Gait velocity: decreased    Exercises Total Joint Exercises Ankle Circles/Pumps: AROM;Both;20 reps Quad Sets: AROM;Both;20 reps Gluteal Sets: AROM;Both;20 reps Towel Squeeze: AROM;Both;20 reps Heel Slides: AROM;Right;15 reps Hip  ABduction/ADduction: AROM;Right;15 reps Straight Leg Raises: 10 reps;AAROM;Right Long Arc Quad: AROM;15 reps;Right   PT Diagnosis:    PT Problem List:   PT Treatment Interventions:     PT Goals Acute Rehab PT Goals PT Goal: Supine/Side to Sit - Progress: Progressing toward goal PT Goal: Stand to Sit - Progress: Progressing toward goal PT Goal: Ambulate - Progress: Progressing toward goal PT Goal: Perform Home Exercise Program - Progress: Progressing toward goal  Visit Information  Last PT Received On: 04/12/12 Assistance Needed: +1    Subjective Data  Subjective: I'm just so tired.   Cognition  Overall Cognitive Status: Appears within functional limits for tasks assessed/performed    Balance     End of Session PT - End of Session Activity Tolerance: Patient tolerated treatment well Patient left: in chair;with call bell/phone within reach   GP     [facility] E 04/12/2012, 12:48 PM Pager: [phone]

## 2012-04-12 NOTE — Progress Notes (Signed)
CM spoke with patient who advises that she plans to return to her home with [facility] home health services upon discharge. Already has DME which does include RW, shower seat, commode seat, ADL equipment.

## 2012-04-12 NOTE — Progress Notes (Signed)
OT Screen Order received, chart reviewed. Spoke briefly with pt who states she has been through this before, has all necessary DME and AE, prn A at home. Pt presents with no OT needs at this time. Will sign off.  Garrel Ridgel, OTR/L  Pager [phone] 04/12/2012

## 2012-04-12 NOTE — Progress Notes (Deleted)
CARE MANAGEMENT NOTE 04/12/2012  Patient:  [name]   Account Number:  [id number]  Date Initiated:  04/12/2012  Documentation initiated by:  Colleen Can  Subjective/Objective Assessment:   DX R/O DVT LOWER EXTREMITIES     Action/Plan:   cm SPOKE WITH PATIENT. pLANS ARE FOR HER TOI RETURN TO HER HOME IN [place] WHERE DAUGHTER, SON-IN-LAW AND GRSNDCHILDREN WILL BE CAREGIVERS. HAS WALKER AND SHOWER STOOL AT HOME   Anticipated DC Date:  04/12/2012   Anticipated DC Plan:  HOME W HOME HEALTH SERVICES  In-house referral  NA      DC Planning Services  CM consult      [facility] Choice  HOME HEALTH   Choice offered to / List presented to:  C-1 Patient   DME arranged  NA      DME agency  NA     HH arranged  HH-2 PT      HH agency  [facility]   Status of service:  Completed, signed off Medicare Important Message given?   (If response is "NO", the following Medicare IM given date fields will be blank) Date Medicare IM given:   Date Additional Medicare IM given:    Discharge Disposition:  HOME W HOME HEALTH SERVICES  Per UR Regulation:  Reviewed for med. necessity/level of care/duration of stay Comments:  04/12/2012 [name] BSN RN CCM [phone] Pt is requesting [facility] if [facility] services are needed. [facility] notified and can provide [facility] services as ordered.

## 2012-04-13 LAB — CBC
HCT: 23.9 % — ABNORMAL LOW (ref 36.0–46.0)
Hemoglobin: 8 g/dL — ABNORMAL LOW (ref 12.0–15.0)
RDW: 13.9 % (ref 11.5–15.5)
WBC: 10 10*3/uL (ref 4.0–10.5)

## 2012-04-13 MED ORDER — METHOCARBAMOL 500 MG PO TABS: 500.0000 mg | ORAL_TABLET | Freq: Four times a day (QID) | ORAL | Status: DC | PRN

## 2012-04-13 MED ORDER — ACETAMINOPHEN 325 MG PO TABS
650.0000 mg | ORAL_TABLET | Freq: Once | ORAL | Status: AC
  Administered 2012-04-13: 650 mg via ORAL

## 2012-04-13 MED ORDER — OXYCODONE HCL 5 MG PO TABS: 5.0000 mg | ORAL_TABLET | ORAL | Status: DC | PRN

## 2012-04-13 NOTE — Progress Notes (Signed)
Pt discharged to family auto via wheelchair. Assessment unchanged from am.  

## 2012-04-13 NOTE — Discharge Summary (Signed)
Physician Discharge Summary   Patient ID: Tiffany MYNHIER MRN: [id number] DOB/AGE: 1931/06/04 78 y.o.  Admit date: 04/10/2012 Discharge date: 04/13/2012  Primary Diagnosis: Right periprosthetic femur fracture   Admission Diagnoses:  Past Medical History  Diagnosis Date  . Hypothyroidism 3-21- 13    tx. levothyroxine  . Blood transfusion 07-27-11    post surgery some time ago  . Cancer 07-27-11    Breast cancer -s/p lt. mastectomy, some squamous cell lesions  . Complication of anesthesia     epinephrine - screams uncontrollably   . Arthritis 07-27-11    osteoarthritis-hips/s/p bil. THA, arthritis right hand   . Anemia     hx of with surgery    Discharge Diagnoses:   Principal Problem:  *Peri-prosthetic fracture of femur following total hip arthroplasty Active Problems:  PostopHyponatremia  Postop Acute blood loss anemia  Postop Transfusion  Estimated Body mass index is 20.47 kg/(m^2) as calculated from the following:   Height as of this encounter: 5\' 5" (1.651 m).   Weight as of this encounter: 123 lb(55.792 kg).  Classification of overweight in adults according to BMI (WHO, 1998)   Procedure: Procedure(s) (LRB): TOTAL HIP REVISION (Right)   Consults: None  HPI: [name] is a 78 year old female, who had a right  total hip arthroplasty over 10 years ago. Did fine until last year when  she was noted to have a periprosthetic fracture. She subsequently had  plating of this fracture after failure of nonoperative management. She  apparently healed the fracture and the plate was removed last week. She  torqued to her leg and felt a pop. She did not have severe pain but was  unable to bear weight. She was seen in the office this morning, noted  to have a refracture through the femur at this time with angulation and  slight displacement. She was admitted to the hospital and presents now  for total hip revision with fixation of the fracture.  Laboratory Data: Admission on  04/10/2012  Component Date Value Range Status  . aPTT 04/10/2012 37  24 - 37 seconds Final   Comment:                                 IF BASELINE aPTT IS ELEVATED,                          SUGGEST PATIENT RISK ASSESSMENT                          BE USED TO DETERMINE APPROPRIATE                          ANTICOAGULANT THERAPY.  . Sodium 04/10/2012 135  135 - 145 mEq/L Final  . Potassium 04/10/2012 4.7  3.5 - 5.1 mEq/L Final  . Chloride 04/10/2012 97  96 - 112 mEq/L Final  . CO2 04/10/2012 31  19 - 32 mEq/L Final  . Glucose, Bld 04/10/2012 92  70 - 99 mg/dL Final  . BUN 40/98/1191 11  6 - 23 mg/dL Final  . Creatinine, Ser 04/10/2012 0.62  0.50 - 1.10 mg/dL Final  . Calcium 47/82/9562 10.0  8.4 - 10.5 mg/dL Final  . GFR calc non Af Amer 04/10/2012 84* >90 mL/min Final  . GFR calc Af Amer 04/10/2012 >90  >90 mL/min Final  Comment:                                 The eGFR has been calculated                          using the CKD EPI equation.                          This calculation has not been                          validated in all clinical                          situations.                          eGFR's persistently                          <90 mL/min signify                          possible Chronic Kidney Disease.  . WBC 04/10/2012 7.8  4.0 - 10.5 K/uL Final  . RBC 04/10/2012 4.04  3.87 - 5.11 MIL/uL Final  . Hemoglobin 04/10/2012 11.5* 12.0 - 15.0 g/dL Final  . HCT 16/02/9603 34.2* 36.0 - 46.0 % Final  . MCV 04/10/2012 84.7  78.0 - 100.0 fL Final  . MCH 04/10/2012 28.5  26.0 - 34.0 pg Final  . MCHC 04/10/2012 33.6  30.0 - 36.0 g/dL Final  . RDW 54/01/8118 13.5  11.5 - 15.5 % Final  . Platelets 04/10/2012 267  150 - 400 K/uL Final  . Prothrombin Time 04/10/2012 12.3  11.6 - 15.2 seconds Final  . INR 04/10/2012 0.92  0.00 - 1.49 Final  . ABO/RH(D) 04/10/2012 O POS   Final  . Antibody Screen 04/10/2012 NEG   Final  . Sample Expiration 04/10/2012 04/13/2012   Final  .  Unit Number 04/10/2012 [id number]   Final  . Blood Component Type 04/10/2012 RED CELLS,LR   Final  . Unit division 04/10/2012 00   Final  . Status of Unit 04/10/2012 ISSUED,FINAL   Final  . Transfusion Status 04/10/2012 OK TO TRANSFUSE   Final  . Crossmatch Result 04/10/2012 Compatible   Final  . Unit Number 04/10/2012 [id number]   Final  . Blood Component Type 04/10/2012 RED CELLS,LR   Final  . Unit division 04/10/2012 00   Final  . Status of Unit 04/10/2012 ISSUED,FINAL   Final  . Transfusion Status 04/10/2012 OK TO TRANSFUSE   Final  . Crossmatch Result 04/10/2012 Compatible   Final  . WBC 04/11/2012 10.5  4.0 - 10.5 K/uL Final  . RBC 04/11/2012 2.62* 3.87 - 5.11 MIL/uL Final  . Hemoglobin 04/11/2012 7.6* 12.0 - 15.0 g/dL Final   Comment: REPEATED TO VERIFY                          DELTA CHECK NOTED  . HCT 04/11/2012 22.0* 36.0 - 46.0 % Final  . MCV 04/11/2012 84.0  78.0 - 100.0 fL Final  . MCH 04/11/2012 29.0  26.0 - 34.0 pg Final  .  MCHC 04/11/2012 34.5  30.0 - 36.0 g/dL Final  . RDW 21/30/8657 13.5  11.5 - 15.5 % Final  . Platelets 04/11/2012 197  150 - 400 K/uL Final   Comment: REPEATED TO VERIFY                          DELTA CHECK NOTED  . Sodium 04/11/2012 132* 135 - 145 mEq/L Final  . Potassium 04/11/2012 4.7  3.5 - 5.1 mEq/L Final  . Chloride 04/11/2012 100  96 - 112 mEq/L Final  . CO2 04/11/2012 27  19 - 32 mEq/L Final  . Glucose, Bld 04/11/2012 144* 70 - 99 mg/dL Final  . BUN 84/69/6295 12  6 - 23 mg/dL Final  . Creatinine, Ser 04/11/2012 0.62  0.50 - 1.10 mg/dL Final  . Calcium 28/41/3244 8.0* 8.4 - 10.5 mg/dL Final  . GFR calc non Af Amer 04/11/2012 84* >90 mL/min Final  . GFR calc Af Amer 04/11/2012 >90  >90 mL/min Final   Comment:                                 The eGFR has been calculated                          using the CKD EPI equation.                          This calculation has not been                          validated in all clinical                           situations.                          eGFR's persistently                          <90 mL/min signify                          possible Chronic Kidney Disease.  . Order Confirmation 04/11/2012 ORDER PROCESSED BY BLOOD BANK   Final  . WBC 04/12/2012 8.3  4.0 - 10.5 K/uL Final  . RBC 04/12/2012 2.97* 3.87 - 5.11 MIL/uL Final  . Hemoglobin 04/12/2012 8.6* 12.0 - 15.0 g/dL Final  . HCT 05/10/7251 25.1* 36.0 - 46.0 % Final  . MCV 04/12/2012 84.5  78.0 - 100.0 fL Final  . MCH 04/12/2012 29.0  26.0 - 34.0 pg Final  . MCHC 04/12/2012 34.3  30.0 - 36.0 g/dL Final  . RDW 66/44/0347 14.1  11.5 - 15.5 % Final  . Platelets 04/12/2012 181  150 - 400 K/uL Final  . Sodium 04/12/2012 133* 135 - 145 mEq/L Final  . Potassium 04/12/2012 3.9  3.5 - 5.1 mEq/L Final   Comment: REPEATED TO VERIFY                          DELTA CHECK NOTED  NO VISIBLE HEMOLYSIS  . Chloride 04/12/2012 102  96 - 112 mEq/L Final  . CO2 04/12/2012 28  19 - 32 mEq/L Final  . Glucose, Bld 04/12/2012 94  70 - 99 mg/dL Final  . BUN 45/40/9811 16  6 - 23 mg/dL Final  . Creatinine, Ser 04/12/2012 0.64  0.50 - 1.10 mg/dL Final  . Calcium 91/47/8295 7.9* 8.4 - 10.5 mg/dL Final  . GFR calc non Af Amer 04/12/2012 83* >90 mL/min Final  . GFR calc Af Amer 04/12/2012 >90  >90 mL/min Final   Comment:                                 The eGFR has been calculated                          using the CKD EPI equation.                          This calculation has not been                          validated in all clinical                          situations.                          eGFR's persistently                          <90 mL/min signify                          possible Chronic Kidney Disease.  . WBC 04/13/2012 10.0  4.0 - 10.5 K/uL Final  . RBC 04/13/2012 2.78* 3.87 - 5.11 MIL/uL Final  . Hemoglobin 04/13/2012 8.0* 12.0 - 15.0 g/dL Final  . HCT 62/13/0865 23.9* 36.0 - 46.0 % Final  . MCV  04/13/2012 86.0  78.0 - 100.0 fL Final  . MCH 04/13/2012 28.8  26.0 - 34.0 pg Final  . MCHC 04/13/2012 33.5  30.0 - 36.0 g/dL Final  . RDW 78/46/9629 13.9  11.5 - 15.5 % Final  . Platelets 04/13/2012 177  150 - 400 K/uL Final  Hospital Outpatient Visit on 04/02/2012  Component Date Value Range Status  . MRSA, PCR 04/02/2012 NEGATIVE  NEGATIVE Final  . Staphylococcus aureus 04/02/2012 NEGATIVE  NEGATIVE Final   Comment:                                 The Xpert SA Assay (FDA                          approved for NASAL specimens                          in patients over 97 years of age),                          is one component of  a comprehensive surveillance                          program.  Test performance has                          been validated by [facility] for patients greater                          than or equal to 54 year old.                          It is not intended                          to diagnose infection nor to                          guide or monitor treatment.  . WBC 04/02/2012 6.5  4.0 - 10.5 K/uL Final  . RBC 04/02/2012 4.54  3.87 - 5.11 MIL/uL Final  . Hemoglobin 04/02/2012 12.9  12.0 - 15.0 g/dL Final  . HCT 65/78/4696 38.9  36.0 - 46.0 % Final  . MCV 04/02/2012 85.7  78.0 - 100.0 fL Final  . MCH 04/02/2012 28.4  26.0 - 34.0 pg Final  . MCHC 04/02/2012 33.2  30.0 - 36.0 g/dL Final  . RDW 29/52/8413 13.9  11.5 - 15.5 % Final  . Platelets 04/02/2012 270  150 - 400 K/uL Final  . Sodium 04/02/2012 136  135 - 145 mEq/L Final  . Potassium 04/02/2012 5.1  3.5 - 5.1 mEq/L Final  . Chloride 04/02/2012 100  96 - 112 mEq/L Final  . CO2 04/02/2012 29  19 - 32 mEq/L Final  . Glucose, Bld 04/02/2012 74  70 - 99 mg/dL Final  . BUN 24/40/1027 14  6 - 23 mg/dL Final  . Creatinine, Ser 04/02/2012 0.61  0.50 - 1.10 mg/dL Final  . Calcium 25/36/6440 9.9  8.4 - 10.5 mg/dL Final  . GFR calc non Af Amer 04/02/2012 85*  >90 mL/min Final  . GFR calc Af Amer 04/02/2012 >90  >90 mL/min Final   Comment:                                 The eGFR has been calculated                          using the CKD EPI equation.                          This calculation has not been                          validated in all clinical                          situations.  eGFR's persistently                          <90 mL/min signify                          possible Chronic Kidney Disease.     X-Rays:Dg Pelvis Portable  04/10/2012  *RADIOLOGY REPORT*  Clinical Data: Postop total right hip revision  PORTABLE PELVIS  Comparison: 07/31/2011  Findings: Since the prior study, the femoral prosthetic component on the right has been replaced with a component with a long intramedullary rod and two circumferential wires.  There is irregularity along the cortex of the proximal femoral shaft, and there is some soft tissue air lateral to the proximal to mid right femoral shaft.  The femoral prosthetic component appears well seated.  The femoral head is well aligned with the acetabular component.  The left hip prosthesis is well seated and aligned and stable.  IMPRESSION: Postop revision of the right hip arthroplasty, specifically the femoral component.  The new component is well seated and aligned with the acetabular component.   Original Report Authenticated By: Amie Portland, M.D.    Dg Femur Right Port  04/10/2012  *RADIOLOGY REPORT*  Clinical Data: Intraoperative evaluation during revision of right hip arthroplasty.  PORTABLE RIGHT FEMUR - 2 VIEW  Comparison: 07/31/2011  Findings: Two views obtained intraoperatively demonstrate normal alignment of an extended femoral prosthesis.  IMPRESSION: Normal alignment of the femoral stem of an extended prosthesis.   Original Report Authenticated By: Irish Lack, M.D.     EKG: Orders placed during the hospital encounter of 07/31/11  . EKG     Hospital Course: Patient  was admitted to [facility] and taken to the OR and underwent the above state procedure without complications.  Patient tolerated the procedure well and was later transferred to the recovery room and then to the orthopaedic floor for postoperative care.  They were given PO and IV analgesics for pain control following their surgery.  They were given 24 hours of postoperative antibiotics of  Anti-infectives     Start     Dose/Rate Route Frequency Ordered Stop   04/11/12 0200   ceFAZolin (ANCEF) IVPB 1 g/50 mL premix        1 g 100 mL/hr over 30 Minutes Intravenous Every 6 hours 04/10/12 2150 04/11/12 0849   04/10/12 1730   ceFAZolin (ANCEF) IVPB 2 g/50 mL premix        2 g 100 mL/hr over 30 Minutes Intravenous  Once 04/10/12 1726 04/10/12 1801         and started on DVT prophylaxis in the form of Lovenox.   PT and OT were ordered for total hip protocol.  The patient was allowed to be WBAT with therapy. Discharge planning was consulted to help with postop disposition and equipment needs.  Patient had a decent night on the evening of surgery and started to get up OOB with therapy on day one. HGB down to 7.6 that morning. Gave her blood that day.  Hemovac drain was pulled without difficulty.  The knee immobilizer was removed and discontinued.  Continued to work with therapy into day two.  Patient stated that she was very tired and worn out from the day before. She denied lightheadedness or dizziness the next morning or the day before when she got up.  HGB was 8.6 and up from 7.6 after blood.  Dressing was changed on day two and the incision was healing well.  By day three, the patient had progressed with therapy and meeting their goals.  Unfortunately, she did get dizzy again the afternoon before so she received another unit of blood. Incision was healing well.  Patient was seen in rounds and was ready to go home after the blood.   Discharge Medications: Prior to Admission medications     Medication Sig Start Date End Date Taking? Authorizing Provider  levothyroxine (SYNTHROID, LEVOTHROID) 125 MCG tablet Take 125 mcg by mouth daily before breakfast.   Yes Historical Provider, MD  acetaminophen (TYLENOL) 500 MG tablet Take 500 mg by mouth every 6 (six) hours as needed. Pain    Historical Provider, MD  amLODipine (NORVASC) 5 MG tablet Take 5 mg by mouth daily before breakfast.     Historical Provider, MD  calcium-vitamin D (OSCAL WITH D) 500-200 MG-UNIT per tablet Take 1 tablet by mouth 2 (two) times daily.     Historical Provider, MD  cholecalciferol (VITAMIN D) 1000 UNITS tablet Take 1,000 Units by mouth daily.    Historical Provider, MD  methocarbamol (ROBAXIN) 500 MG tablet Take 1 tablet (500 mg total) by mouth every 6 (six) hours as needed. 04/13/12   Paige Vanderwoude, PA  oxyCODONE (OXY IR/ROXICODONE) 5 MG immediate release tablet Take 1-2 tablets (5-10 mg total) by mouth every 3 (three) hours as needed. 04/13/12   Keshun Berrett Julien Girt, PA    Diet: Regular diet Activity:WBAT No bending hip over 90 degrees- A "L" Angle Do not cross legs Do not let foot roll inward When turning these patients a pillow should be placed between the patient's legs to prevent crossing. Patients should have the affected knee fully extended when trying to sit or stand from all surfaces to prevent excessive hip flexion. When ambulating and turning toward the affected side the affected leg should have the toes turned out prior to moving the walker and the rest of patient's body as to prevent internal rotation/ turning in of the leg. Abduction pillows are the most effective way to prevent a patient from not crossing legs or turning toes in at rest. If an abduction pillow is not ordered placing a regular pillow length wise between the patient's legs is also an effective reminder. It is imperative that these precautions be maintained so that the surgical hip does not dislocate. Follow-up:in 2  weeks Disposition - Home Discharged Condition: good   Discharge Orders    Future Orders Please Complete By Expires   Diet general      Call MD / Call 911      Comments:   If you experience chest pain or shortness of breath, CALL 911 and be transported to the hospital emergency room.  If you develope a fever above 101 F, pus (white drainage) or increased drainage or redness at the wound, or calf pain, call your surgeon's office.   Discharge instructions      Comments:   Pick up stool softner and laxative for home. Do not submerge incision under water. May shower. Continue to use ice for pain and swelling from surgery. Hip precautions.  Total Hip Protocol.  Take an enteric coated 325 mg Aspirin by mouth daily for four weeks, then resume 81 mg Aspirin daily.   Constipation Prevention      Comments:   Drink plenty of fluids.  Prune juice may be helpful.  You may use a stool softener, such as Colace (over the counter)  100 mg twice a day.  Use MiraLax (over the counter) for constipation as needed.   Increase activity slowly as tolerated      Patient may shower      Comments:   You may shower without a dressing once there is no drainage.  Do not wash over the wound.  If drainage remains, do not shower until drainage stops.   Weight bearing as tolerated      Driving restrictions      Comments:   No driving until released by the physician.   Lifting restrictions      Comments:   No lifting until released by the physician.   Follow the hip precautions as taught in Physical Therapy      Change dressing      Comments:   You may change your dressing dressing daily with sterile 4 x 4 inch gauze dressing and paper tape.  Do not submerge the incision under water.   TED hose      Comments:   Use stockings (TED hose) for 3 weeks on both leg(s).  You may remove them at night for sleeping.   Do not sit on low chairs, stoools or toilet seats, as it may be difficult to get up from low surfaces           Medication List     As of 04/13/2012  8:15 AM    STOP taking these medications         aspirin EC 81 MG tablet      diclofenac sodium 1 % Gel   Commonly known as: VOLTAREN      fish oil-omega-3 fatty acids 1000 MG capsule      HYDROcodone-acetaminophen 5-325 MG per tablet   Commonly known as: NORCO/VICODIN      multivitamins ther. w/minerals Tabs      naproxen sodium 220 MG tablet   Commonly known as: ANAPROX      TAKE these medications         acetaminophen 500 MG tablet   Commonly known as: TYLENOL   Take 500 mg by mouth every 6 (six) hours as needed. Pain      amLODipine 5 MG tablet   Commonly known as: NORVASC   Take 5 mg by mouth daily before breakfast.      calcium-vitamin D 500-200 MG-UNIT per tablet   Commonly known as: OSCAL WITH D   Take 1 tablet by mouth 2 (two) times daily.      cholecalciferol 1000 UNITS tablet   Commonly known as: VITAMIN D   Take 1,000 Units by mouth daily.      levothyroxine 125 MCG tablet   Commonly known as: SYNTHROID, LEVOTHROID   Take 125 mcg by mouth daily before breakfast.      methocarbamol 500 MG tablet   Commonly known as: ROBAXIN   Take 1 tablet (500 mg total) by mouth every 6 (six) hours as needed.      oxyCODONE 5 MG immediate release tablet   Commonly known as: Oxy IR/ROXICODONE   Take 1-2 tablets (5-10 mg total) by mouth every 3 (three) hours as needed.           Follow-up Information    Follow up with Loanne Drilling, MD. Schedule an appointment as soon as possible for a visit in 2 weeks.   Contact information:   [address], [address] [place] [address] [phone]  SignedPatrica Duel 04/13/2012, 8:15 AM

## 2012-04-13 NOTE — Progress Notes (Signed)
Physical Therapy Treatment Patient Details Name: AMARIS DELAFUENTE MRN: [id number] DOB: [birth year] Today's Date: 04/13/2012 Time: 4098-1191 PT Time Calculation (min): 27 min  PT Assessment / Plan / Recommendation Comments on Treatment Session  Pt doing very well with all mobility and is ready for D/C today.     Follow Up Recommendations  Home health PT     Does the patient have the potential to tolerate intense rehabilitation     Barriers to Discharge        Equipment Recommendations  None recommended by PT    Recommendations for Other Services    Frequency 7X/week   Plan Discharge plan remains appropriate;Frequency remains appropriate    Precautions / Restrictions Precautions Precautions: Posterior Hip Restrictions Weight Bearing Restrictions: No RLE Weight Bearing: Weight bearing as tolerated   Pertinent Vitals/Pain 4/10    Mobility  Bed Mobility Bed Mobility: Supine to Sit Supine to Sit: 6: Modified independent (Device/Increase time) Details for Bed Mobility Assistance: Increased time Transfers Transfers: Stand to Sit;Sit to Stand Sit to Stand: 6: Modified independent (Device/Increase time);From bed Stand to Sit: 6: Modified independent (Device/Increase time);To chair/3-in-1 Details for Transfer Assistance: increased time.  doing well  Ambulation/Gait Ambulation/Gait Assistance: 5: Supervision Ambulation Distance (Feet): 100 Feet Assistive device: Rolling walker Ambulation/Gait Assistance Details: Supervision for safety due to pt receiving blood at time.  No c/o dizziness Gait Pattern: Step-through pattern Gait velocity: decreased    Exercises Total Joint Exercises Ankle Circles/Pumps: AROM;Both;20 reps Quad Sets: Strengthening;Both;10 reps Gluteal Sets: Strengthening;Both;10 reps Towel Squeeze: Strengthening;Both;10 reps Short Arc Quad: Strengthening;Right;10 reps Heel Slides: AROM;Right;10 reps Hip ABduction/ADduction: AROM;Right;10 reps Straight Leg  Raises: AAROM;Right;5 reps Long Arc Quad: Strengthening;Right;5 reps   PT Diagnosis:    PT Problem List:   PT Treatment Interventions:     PT Goals Acute Rehab PT Goals PT Goal Formulation: With patient Time For Goal Achievement: 04/15/12 Potential to Achieve Goals: Good Pt will go Supine/Side to Sit: with modified independence PT Goal: Supine/Side to Sit - Progress: Met Pt will go Sit to Stand: with modified independence PT Goal: Sit to Stand - Progress: Met Pt will go Stand to Sit: with modified independence PT Goal: Stand to Sit - Progress: Met Pt will Ambulate: 51 - 150 feet;with modified independence;with least restrictive assistive device PT Goal: Ambulate - Progress: Progressing toward goal Pt will Perform Home Exercise Program: with supervision, verbal cues required/provided PT Goal: Perform Home Exercise Program - Progress: Met  Visit Information  Last PT Received On: 04/13/12 Assistance Needed: +1    Subjective Data  Subjective: It feels good to be up on this leg.  Patient Stated Goal: hiking and walking   Cognition  Overall Cognitive Status: Appears within functional limits for tasks assessed/performed Arousal/Alertness: Awake/alert Orientation Level: Appears intact for tasks assessed Behavior During Session: [facility] for tasks performed    Balance     End of Session PT - End of Session Activity Tolerance: Patient tolerated treatment well Patient left: in chair;with call bell/phone within reach Nurse Communication: Mobility status   GP     Page, Meribeth Mattes 04/13/2012, 11:04 AM

## 2012-04-13 NOTE — Progress Notes (Signed)
   Subjective: 3 Days Post-Op Procedure(s) (LRB): TOTAL HIP REVISION (Right) Patient reports pain as mild.   Patient seen in rounds with Dr. Lequita Halt.  She did dizzy yesterday.  Will give one more unit of blood. Patient is well, and has had no acute complaints or problems Plan is to go Home after hospital stay.  Objective: Vital signs in last 24 hours: Temp:  [98.4 F (36.9 C)-99 F (37.2 C)] 98.4 F (36.9 C) (12/07 0617) Pulse Rate:  [61-70] 61  (12/07 0617) Resp:  [16] 16  (12/07 0617) BP: (116-135)/(64-68) 135/68 mmHg (12/07 0617) SpO2:  [94 %-98 %] 98 % (12/07 0617)  Intake/Output from previous day:  Intake/Output Summary (Last 24 hours) at 04/13/12 0725 Last data filed at 04/13/12 0617  Gross per 24 hour  Intake    240 ml  Output      0 ml  Net    240 ml    Intake/Output this shift:    Labs:  Basename 04/13/12 0635 04/12/12 0434 04/11/12 0429 04/10/12 1330  HGB 8.0* 8.6* 7.6* 11.5*    Basename 04/13/12 0635 04/12/12 0434  WBC 10.0 8.3  RBC 2.78* 2.97*  HCT 23.9* 25.1*  PLT 177 181    Basename 04/12/12 0434 04/11/12 0429  NA 133* 132*  K 3.9 4.7  CL 102 100  CO2 28 27  BUN 16 12  CREATININE 0.64 0.62  GLUCOSE 94 144*  CALCIUM 7.9* 8.0*    Basename 04/10/12 1330  LABPT --  INR 0.92    EXAM General - Patient is Alert, Appropriate and Oriented Extremity - Neurovascular intact Sensation intact distally Dorsiflexion/Plantar flexion intact Incision: dressing C/D/I No cellulitis present Dressing/Incision - clean, dry, no drainage, healing Motor Function - intact, moving foot and toes well on exam.   Past Medical History  Diagnosis Date  . Hypothyroidism 3-21- 13    tx. levothyroxine  . Blood transfusion 07-27-11    post surgery some time ago  . Cancer 07-27-11    Breast cancer -s/p lt. mastectomy, some squamous cell lesions  . Complication of anesthesia     epinephrine - screams uncontrollably   . Arthritis 07-27-11    osteoarthritis-hips/s/p  bil. THA, arthritis right hand   . Anemia     hx of with surgery     Assessment/Plan: 3 Days Post-Op Procedure(s) (LRB): TOTAL HIP REVISION (Right) Principal Problem:  *Peri-prosthetic fracture of femur following total hip arthroplasty Active Problems:  PostopHyponatremia  Postop Acute blood loss anemia  Postop Transfusion  Estimated Body mass index is 20.47 kg/(m^2) as calculated from the following:   Height as of this encounter: 5\' 5" (1.651 m).   Weight as of this encounter: 123 lb(55.792 kg). Up with therapy Discharge home with home health  DVT Prophylaxis - Lovenox Weight Bearing As Tolerated right Leg DC Home today after blood.   PERKINS, ALEXZANDREW 04/13/2012, 7:25 AM

## 2012-04-13 NOTE — Care Management Note (Signed)
    Page 1 of 2   04/13/2012     5:32:38 PM   CARE MANAGEMENT NOTE 04/13/2012  Patient:  [name], [name]   Account Number:  [id number]  Date Initiated:  04/12/2012  Documentation initiated by:  Colleen Can  Subjective/Objective Assessment:   dx rt periprosthetic femure fracture ; rt total hip arthroplasty revision with fixation of fracture     Action/Plan:   Cm spoke with patient. Plans are for return to her home where family members will be caregivers. She already has dme. Wants [facility] for [facility] services   Anticipated DC Date:  04/13/2012   Anticipated DC Plan:  HOME W HOME HEALTH SERVICES  In-house referral  Clinical Social Worker      DC Planning Services  CM consult      [facility] Choice  HOME HEALTH   Choice offered to / List presented to:  C-1 Patient   DME arranged  NA      DME agency  NA     HH arranged  HH-2 PT      [facility] agency  [employer]   Status of service:  Completed, signed off Medicare Important Message given?  NA - LOS <3 / Initial given by admissions (If response is "NO", the following Medicare IM given date fields will be blank) Date Medicare IM given:   Date Additional Medicare IM given:    Discharge Disposition:  HOME W HOME HEALTH SERVICES  Per UR Regulation:    If discussed at Long Length of Stay Meetings, dates discussed:    Comments:  04/13/2012 Raynelle Bring BSN CCM [phone] Pt for discharge today. faxed HHorders, op note and HH orders with face sheet to [phone]; confirmation received. Jacki Cones rep with [facility] aware of discharge. Services to be set up within 48hrs.

## 2012-04-15 LAB — TYPE AND SCREEN
ABO/RH(D): O POS
Antibody Screen: NEGATIVE
Unit division: 0

## 2012-04-25 NOTE — Discharge Summary (Signed)
Physician Discharge Summary   Patient ID: [name] MRN: [id number] DOB/AGE: 1932-01-17 78 y.o.  Admit date: 04/05/2012 Discharge date: 04/06/2012 Primary Diagnosis: Painful hardware right femur   Admission Diagnoses:  Past Medical History  Diagnosis Date  . Hypothyroidism 3-21- 13    tx. levothyroxine  . Blood transfusion 07-27-11    post surgery some time ago  . Cancer 07-27-11    Breast cancer -s/p lt. mastectomy, some squamous cell lesions  . Complication of anesthesia     epinephrine - screams uncontrollably   . Arthritis 07-27-11    osteoarthritis-hips/s/p bil. THA, arthritis right hand   . Anemia     hx of with surgery    Discharge Diagnoses:   Principal Problem:  *Painful orthopaedic hardware  Estimated Body mass index is 20.60 kg/(m^2) as calculated from the following:   Height as of this encounter: 5\' 5" (1.651 m).   Weight as of this encounter: 123 lb 12.8 oz(56.155 kg).  Classification of overweight in adults according to BMI (WHO, 1998)   Procedure: Procedure(s) (LRB): HARDWARE REMOVAL (Right)   Consults: None  HPI: Ms. Ariola is a 78 year old female, who had open  reduction and internal fixation of a right periprosthetic femur fracture  in March of this year. She went on to heal uneventfully. In the past  2 months she has noticed pain on the lateral aspect of her thigh  consistent with irritation from the hardware. She presents now for  hardware removal.  Laboratory Data: Hospital Outpatient Visit on 04/02/2012  Component Date Value Range Status  . MRSA, PCR 04/02/2012 NEGATIVE  NEGATIVE Final  . Staphylococcus aureus 04/02/2012 NEGATIVE  NEGATIVE Final   Comment:                                 The Xpert SA Assay (FDA                          approved for NASAL specimens                          in patients over 41 years of age),                          is one component of                          a comprehensive surveillance              program.  Test performance has                          been validated by [employer] for patients greater                          than or equal to 69 year old.                          It is not intended  to diagnose infection nor to                          guide or monitor treatment.  . WBC 04/02/2012 6.5  4.0 - 10.5 K/uL Final  . RBC 04/02/2012 4.54  3.87 - 5.11 MIL/uL Final  . Hemoglobin 04/02/2012 12.9  12.0 - 15.0 g/dL Final  . HCT 16/02/9603 38.9  36.0 - 46.0 % Final  . MCV 04/02/2012 85.7  78.0 - 100.0 fL Final  . MCH 04/02/2012 28.4  26.0 - 34.0 pg Final  . MCHC 04/02/2012 33.2  30.0 - 36.0 g/dL Final  . RDW 54/01/8118 13.9  11.5 - 15.5 % Final  . Platelets 04/02/2012 270  150 - 400 K/uL Final  . Sodium 04/02/2012 136  135 - 145 mEq/L Final  . Potassium 04/02/2012 5.1  3.5 - 5.1 mEq/L Final  . Chloride 04/02/2012 100  96 - 112 mEq/L Final  . CO2 04/02/2012 29  19 - 32 mEq/L Final  . Glucose, Bld 04/02/2012 74  70 - 99 mg/dL Final  . BUN 14/78/2956 14  6 - 23 mg/dL Final  . Creatinine, Ser 04/02/2012 0.61  0.50 - 1.10 mg/dL Final  . Calcium 21/30/8657 9.9  8.4 - 10.5 mg/dL Final  . GFR calc non Af Amer 04/02/2012 85* >90 mL/min Final  . GFR calc Af Amer 04/02/2012 >90  >90 mL/min Final   Comment:                                 The eGFR has been calculated                          using the CKD EPI equation.                          This calculation has not been                          validated in all clinical                          situations.                          eGFR's persistently                          <90 mL/min signify                          possible Chronic Kidney Disease.     X-Rays:Dg Pelvis Portable  04/10/2012  *RADIOLOGY REPORT*  Clinical Data: Postop total right hip revision  PORTABLE PELVIS  Comparison: 07/31/2011  Findings: Since the prior study, the femoral prosthetic component on  the right has been replaced with a component with a long intramedullary rod and two circumferential wires.  There is irregularity along the cortex of the proximal femoral shaft, and there is some soft tissue air lateral to the proximal to mid right femoral shaft.  The femoral prosthetic component appears well seated.  The femoral head is well aligned with the acetabular component.  The left hip prosthesis is well seated and aligned and stable.  IMPRESSION: Postop revision of the right hip arthroplasty, specifically the femoral component.  The new component is well seated and aligned with the acetabular component.   Original Report Authenticated By: Amie Portland, M.D.    Dg Femur Right Port  04/10/2012  *RADIOLOGY REPORT*  Clinical Data: Intraoperative evaluation during revision of right hip arthroplasty.  PORTABLE RIGHT FEMUR - 2 VIEW  Comparison: 07/31/2011  Findings: Two views obtained intraoperatively demonstrate normal alignment of an extended femoral prosthesis.  IMPRESSION: Normal alignment of the femoral stem of an extended prosthesis.   Original Report Authenticated By: Irish Lack, M.D.     EKG: Orders placed during the hospital encounter of 07/31/11  . EKG     Hospital Course: Patient was admitted to [facility] and taken to the OR and underwent the above state procedure without complications.  Patient tolerated the procedure well and was later transferred to the recovery room and then to the orthopaedic floor for postoperative care.  They were given PO and IV analgesics for pain control following their surgery.  They were given 24 hours of postoperative antibiotics of  Anti-infectives     Start     Dose/Rate Route Frequency Ordered Stop   04/05/12 1330   ceFAZolin (ANCEF) IVPB 1 g/50 mL premix        1 g 100 mL/hr over 30 Minutes Intravenous Every 6 hours 04/05/12 1029 04/06/12 0256   04/05/12 0518   ceFAZolin (ANCEF) IVPB 2 g/50 mL premix        2 g 100 mL/hr over 30  Minutes Intravenous 60 min pre-op 04/05/12 0518 04/05/12 0732         PT ordered for mobility.  The patient was allowed to be WBAT with therapy. Discharge planning was consulted to help with postop disposition and equipment needs.  Patient had a decent night on the evening of surgery and started to get up OOB with therapy on day one. She got up and walked with therapy and did well with her mobility. Patient was seen in rounds by the weekend coverage staff and was ready to go home.   Discharge Medications: Prior to Admission medications   Medication Sig Start Date End Date Taking? Authorizing Provider  amLODipine (NORVASC) 5 MG tablet Take 5 mg by mouth daily before breakfast.    Yes Historical Provider, MD  calcium-vitamin D (OSCAL WITH D) 500-200 MG-UNIT per tablet Take 1 tablet by mouth 2 (two) times daily.    Yes Historical Provider, MD  cholecalciferol (VITAMIN D) 1000 UNITS tablet Take 1,000 Units by mouth daily.   Yes Historical Provider, MD  levothyroxine (SYNTHROID, LEVOTHROID) 125 MCG tablet Take 125 mcg by mouth daily before breakfast.   Yes Historical Provider, MD  acetaminophen (TYLENOL) 500 MG tablet Take 500 mg by mouth every 6 (six) hours as needed. Pain    Historical Provider, MD  methocarbamol (ROBAXIN) 500 MG tablet Take 1 tablet (500 mg total) by mouth every 6 (six) hours as needed. 04/13/12   Arlin Savona, PA  oxyCODONE (OXY IR/ROXICODONE) 5 MG immediate release tablet Take 1-2 tablets (5-10 mg total) by mouth every 3 (three) hours as needed. 04/13/12   Lelaina Oatis Julien Girt, PA    Diet: Regular diet Activity:WBAT Follow-up:in 2 weeks Disposition - Home Discharged Condition: good   Discharge Orders    Future Orders Please Complete By Expires   Diet - low sodium heart healthy      Call MD / Call 911  Comments:   If you experience chest pain or shortness of breath, CALL 911 and be transported to the hospital emergency room.  If you develope a fever above 101 F,  pus (white drainage) or increased drainage or redness at the wound, or calf pain, call your surgeon's office.   Increase activity slowly as tolerated      Discharge instructions      Comments:   You may remove your large bandage and shower in 2 days  Resume your 81 mg Aspirin upon discharge  Use crutches or a walker for 3-4 weeks for protection. May bear full weight on your right leg while ambulating   Call MD / Call 911      Comments:   If you experience chest pain or shortness of breath, CALL 911 and be transported to the hospital emergency room.  If you develope a fever above 101 F, pus (white drainage) or increased drainage or redness at the wound, or calf pain, call your surgeon's office.   Constipation Prevention      Comments:   Drink plenty of fluids.  Prune juice may be helpful.  You may use a stool softener, such as Colace (over the counter) 100 mg twice a day.  Use MiraLax (over the counter) for constipation as needed.   Increase activity slowly as tolerated          Medication List     As of 04/25/2012 10:02 AM    STOP taking these medications         aspirin EC 81 MG tablet      diclofenac sodium 1 % Gel   Commonly known as: VOLTAREN      fish oil-omega-3 fatty acids 1000 MG capsule      multivitamins ther. w/minerals Tabs      naproxen sodium 220 MG tablet   Commonly known as: ANAPROX      TAKE these medications         acetaminophen 500 MG tablet   Commonly known as: TYLENOL   Take 500 mg by mouth every 6 (six) hours as needed. Pain      amLODipine 5 MG tablet   Commonly known as: NORVASC   Take 5 mg by mouth daily before breakfast.      calcium-vitamin D 500-200 MG-UNIT per tablet   Commonly known as: OSCAL WITH D   Take 1 tablet by mouth 2 (two) times daily.      cholecalciferol 1000 UNITS tablet   Commonly known as: VITAMIN D   Take 1,000 Units by mouth daily.      levothyroxine 125 MCG tablet   Commonly known as: SYNTHROID, LEVOTHROID    Take 125 mcg by mouth daily before breakfast.           Follow-up Information    Follow up with Loanne Drilling, MD. Schedule an appointment as soon as possible for a visit in 2 weeks. (Call [phone] Monday to make the appointment)    Contact information:   [address], [address] [place] [address] [phone]          Signed: Patrica Duel 04/25/2012, 10:02 AM

## 2012-05-24 ENCOUNTER — Other Ambulatory Visit (HOSPITAL_COMMUNITY): Payer: Self-pay | Admitting: Orthopedic Surgery

## 2012-05-24 DIAGNOSIS — M25552 Pain in left hip: Secondary | ICD-10-CM

## 2012-05-30 ENCOUNTER — Encounter (HOSPITAL_COMMUNITY)
Admission: RE | Admit: 2012-05-30 | Discharge: 2012-05-30 | Disposition: A | Discharge status: Home / Self Care | Payer: Medicare Other | Source: Ambulatory Visit | Attending: Orthopedic Surgery | Admitting: Orthopedic Surgery

## 2012-05-30 DIAGNOSIS — M25552 Pain in left hip: Secondary | ICD-10-CM

## 2012-05-30 DIAGNOSIS — M25559 Pain in unspecified hip: Secondary | ICD-10-CM | POA: Insufficient documentation

## 2012-05-30 DIAGNOSIS — Z96649 Presence of unspecified artificial hip joint: Secondary | ICD-10-CM | POA: Insufficient documentation

## 2012-05-30 MED ORDER — TECHNETIUM TC 99M MEDRONATE IV KIT
25.0000 | PACK | Freq: Once | INTRAVENOUS | Status: AC | PRN
  Administered 2012-05-30: 25 via INTRAVENOUS

## 2012-06-03 ENCOUNTER — Other Ambulatory Visit (HOSPITAL_COMMUNITY): Payer: Medicare Other

## 2012-06-03 ENCOUNTER — Encounter (HOSPITAL_COMMUNITY): Payer: Medicare Other

## 2012-06-08 DIAGNOSIS — Z5189 Encounter for other specified aftercare: Secondary | ICD-10-CM

## 2012-06-08 DIAGNOSIS — IMO0001 Reserved for inherently not codable concepts without codable children: Secondary | ICD-10-CM

## 2012-06-08 HISTORY — DX: Reserved for inherently not codable concepts without codable children: IMO0001

## 2012-06-08 HISTORY — DX: Encounter for other specified aftercare: Z51.89

## 2012-06-17 ENCOUNTER — Other Ambulatory Visit (HOSPITAL_COMMUNITY): Payer: Self-pay | Admitting: Orthopedic Surgery

## 2012-06-17 ENCOUNTER — Encounter (HOSPITAL_COMMUNITY): Payer: Self-pay | Admitting: Pharmacy Technician

## 2012-06-17 NOTE — Progress Notes (Signed)
Need orders for 06-18-12 pre op at 930am. thanks

## 2012-06-17 NOTE — Patient Instructions (Addendum)
[address] TOINETTE LACKIE  06/17/2012   Your procedure is scheduled on: 06-27-2012  Report to [facility] at 1115 AM.  Call this number if you have problems the morning of surgery [phone]   Remember:   Do not eat food  :After Midnight.   clear liquids midnight until 0815 am day of surgery, then nothing by mouth.    Take these medicines the morning of surgery with A SIP OF WATER: amlodipine, synthroid                                SEE [place] PREPARING FOR SURGERY SHEET   Do not wear jewelry, make-up or nail polish.  Do not wear lotions, powders, or perfumes. You may wear deodorant.   Men may shave face and neck.  Do not bring valuables to the hospital.  Contacts, dentures or bridgework may not be worn into surgery.  Leave suitcase in the car. After surgery it may be brought to your room.  For patients admitted to the hospital, checkout time is 11:00 AM the day of discharge.   Patients discharged the day of surgery will not be allowed to drive home.  Name and phone number of your driver:  Special Instructions: N/A   Please read over the following fact sheets that you were given: MRSA Information, blood fact sheet, incentive spirometer fact sheet, clear liquid sheet  Call Cain Sieve RN pre op nurse if needed [phone]    FAILURE TO FOLLOW THESE INSTRUCTIONS MAY RESULT IN THE CANCELLATION OF YOUR SURGERY. PATIENT SIGNATURE___________________________________________

## 2012-06-17 NOTE — Progress Notes (Signed)
Need MD order entry in Epic. Presurgical testing appointment is 06-18-12 1030 AM.

## 2012-06-17 NOTE — Progress Notes (Signed)
ekg and 2 view chest xray 07-27-2011 epic Medical clearance note dr Wylene Simmer 06-13-2012 on chart

## 2012-06-18 ENCOUNTER — Encounter (HOSPITAL_COMMUNITY): Payer: Self-pay

## 2012-06-18 ENCOUNTER — Other Ambulatory Visit: Payer: Self-pay | Admitting: Orthopedic Surgery

## 2012-06-18 ENCOUNTER — Encounter (HOSPITAL_COMMUNITY)
Admission: RE | Admit: 2012-06-18 | Discharge: 2012-06-18 | Disposition: A | Discharge status: Home / Self Care | Payer: Medicare Other | Source: Ambulatory Visit | Attending: Orthopedic Surgery | Admitting: Orthopedic Surgery

## 2012-06-18 ENCOUNTER — Ambulatory Visit (HOSPITAL_COMMUNITY)
Admission: RE | Admit: 2012-06-18 | Discharge: 2012-06-18 | Disposition: A | Discharge status: Home / Self Care | Payer: Medicare Other | Source: Ambulatory Visit | Attending: Orthopedic Surgery | Admitting: Orthopedic Surgery

## 2012-06-18 DIAGNOSIS — T84029A Dislocation of unspecified internal joint prosthesis, initial encounter: Secondary | ICD-10-CM | POA: Insufficient documentation

## 2012-06-18 DIAGNOSIS — Z01812 Encounter for preprocedural laboratory examination: Secondary | ICD-10-CM | POA: Insufficient documentation

## 2012-06-18 DIAGNOSIS — X58XXXA Exposure to other specified factors, initial encounter: Secondary | ICD-10-CM | POA: Insufficient documentation

## 2012-06-18 HISTORY — DX: Unspecified hearing loss, unspecified ear: H91.90

## 2012-06-18 HISTORY — DX: Essential (primary) hypertension: I10

## 2012-06-18 LAB — URINALYSIS, ROUTINE W REFLEX MICROSCOPIC
Bilirubin Urine: NEGATIVE
Ketones, ur: NEGATIVE mg/dL
Leukocytes, UA: NEGATIVE
Nitrite: NEGATIVE
Protein, ur: NEGATIVE mg/dL
pH: 7.5 (ref 5.0–8.0)

## 2012-06-18 LAB — COMPREHENSIVE METABOLIC PANEL
ALT: 12 U/L (ref 0–35)
AST: 12 U/L (ref 0–37)
Albumin: 4 g/dL (ref 3.5–5.2)
Alkaline Phosphatase: 78 U/L (ref 39–117)
Potassium: 4.5 mEq/L (ref 3.5–5.1)
Sodium: 133 mEq/L — ABNORMAL LOW (ref 135–145)
Total Protein: 7.5 g/dL (ref 6.0–8.3)

## 2012-06-18 LAB — PROTIME-INR: Prothrombin Time: 12.9 seconds (ref 11.6–15.2)

## 2012-06-18 LAB — CBC
MCHC: 32 g/dL (ref 30.0–36.0)
Platelets: 250 10*3/uL (ref 150–400)
RDW: 13.7 % (ref 11.5–15.5)

## 2012-06-18 LAB — APTT: aPTT: 30 seconds (ref 24–37)

## 2012-06-18 MED ORDER — DEXAMETHASONE SODIUM PHOSPHATE 10 MG/ML IJ SOLN: 10.0000 mg | Freq: Once | INTRAMUSCULAR | Status: DC

## 2012-06-18 MED ORDER — BUPIVACAINE LIPOSOME 1.3 % IJ SUSP: 20.0000 mL | Freq: Once | INTRAMUSCULAR | Status: DC

## 2012-06-18 NOTE — Progress Notes (Signed)
Preoperative surgical orders have been place into the Epic hospital system for MAANASA ADERHOLD on 06/18/2012, 10:44 AM  by Patrica Duel for surgery on 06/27/2012.  Preop Total Hip orders including Experel Injecion, IV Tylenol, and IV Decadron as long as there are no contraindications to the above medications. Avel Peace, PA-C

## 2012-06-26 NOTE — H&P (Signed)
TOTAL HIP REVISION ADMISSION H&P  Patient is admitted for left revision total hip arthroplasty.  Subjective:  Chief Complaint: left hip pain  HPI: [name], 79 y.o. female, has a history of pain and functional disability in the left hip due to failure of left total hip arthroplasty and patient has failed non-surgical conservative treatments for greater than 12 weeks to include flexibility and strengthening excercises, use of assistive devices and activity modification. The indications for the revision total hip arthroplasty are loosening of one or more components.  Onset of symptoms was gradual starting 1 years ago with gradually worsening course since that time.  Prior procedures on the left hip include arthroplasty.  Patient currently rates pain in the left hip at 7 out of 10 with activity.  There is night pain, worsening of pain with activity and weight bearing, pain that interfers with activities of daily living and pain with passive range of motion. Patient has evidence of prosthetic loosening by imaging studies.  This condition presents safety issues increasing the risk of falls. There is no current active infection.  Patient Active Problem List   Diagnosis Date Noted  . Painful orthopaedic hardware 04/05/2012  . PostopHyponatremia 08/09/2011  . Postop Acute blood loss anemia 08/09/2011  . Postop Transfusion 08/09/2011  . Peri-prosthetic fracture of femur following total hip arthroplasty 07/31/2011   Past Medical History  Diagnosis Date  . Hypothyroidism 3-21- 13    tx. levothyroxine  . Blood transfusion 04-10-2012    post surgery some time ago  . Arthritis 07-27-11    osteoarthritis-hips/s/p bil. THA, arthritis right hand   . Hypertension   . Anemia     hx of with surgery   . Hard of hearing     both ears mild loss  . Complication of anesthesia     epinephrine - screams uncontrollably   . Cancer 07-27-11    Breast cancer -s/p lt. mastectomy, some squamous cell lesions      Past Surgical History  Procedure Laterality Date  . Abdominal hysterectomy    . Ankle surgery  2005    right ankle tendon repair  . Mastectomy  07-27-11    left  . Cataract extraction, bilateral  few yrs ago    Bilateral  . Squamous cell carcinoma excision  07-27-11    x2 left shoulder  . Orif femur fracture  07/31/2011    Procedure: OPEN REDUCTION INTERNAL FIXATION (ORIF) DISTAL FEMUR FRACTURE;  Surgeon: Loanne Drilling, MD;  Location: [facility];  Service: Orthopedics;  Laterality: Right;  right femur  (c-arm)   . Hardware removal  04/05/2012    Procedure: HARDWARE REMOVAL;  Surgeon: Loanne Drilling, MD;  Location: [facility];  Service: Orthopedics;  Laterality: Right;  Removal of Hardware Right Femur   . Total hip revision  04/10/2012    Procedure: TOTAL HIP REVISION;  Surgeon: Loanne Drilling, MD;  Location: [facility];  Service: Orthopedics;  Laterality: Right;  . Breast surgery  07-1987    lt.breast cancer intraductal cancer, mastectomy  . Joint replacement  07-27-11    Bilateral: Right THA - 04/2000, Left THA 06/2001  . Left hip replacement Left feb 2003    Current outpatient prescriptions: amLODipine (NORVASC) 5 MG tablet, Take 5 mg by mouth daily before breakfast. , Disp: , Rfl: ;  calcium-vitamin D (OSCAL WITH D) 500-200 MG-UNIT per tablet, Take 1 tablet by mouth 2 (two) times daily. , Disp: , Rfl: ;   cholecalciferol (VITAMIN D)  1000 UNITS tablet, Take 1,000 Units by mouth daily., Disp: , Rfl: ;  fish oil-omega-3 fatty acids 1000 MG capsule, Take 1 g by mouth daily., Disp: , Rfl:  levothyroxine (SYNTHROID, LEVOTHROID) 125 MCG tablet, Take 125 mcg by mouth daily before breakfast., Disp: , Rfl: ;   Multiple Vitamin (MULTIVITAMIN WITH MINERALS) TABS, Take 1 tablet by mouth daily., Disp: , Rfl: ;  naproxen sodium (ANAPROX) 220 MG tablet, Take 220 mg by mouth 2 (two) times daily as needed (for pain)., Disp: , Rfl:   Allergies  Allergen Reactions  . Epinephrine Other (See Comments)     Caused  Scream uncontrollably    History  Substance Use Topics  . Smoking status: Former Smoker -- 1.00 packs/day for 10 years    Types: Cigarettes    Quit date: 07/26/1952  . Smokeless tobacco: Never Used  . Alcohol Use: 0.6 oz/week    1 Glasses of wine per week     Comment: wine daily with supper    Family History  Problem Relation Age of Onset  . Cancer Mother   . Cancer Father       Review of Systems  Constitutional: Positive for diaphoresis. Negative for fever, chills, weight loss and malaise/fatigue.  HENT: Negative.  Negative for neck pain.   Eyes: Negative.   Respiratory: Negative.   Cardiovascular: Negative.   Gastrointestinal: Positive for diarrhea and constipation. Negative for heartburn, nausea, vomiting, abdominal pain, blood in stool and melena.  Genitourinary: Negative.   Musculoskeletal: Positive for myalgias, back pain and joint pain. Negative for falls.  Skin: Negative.   Neurological: Negative.  Negative for weakness.  Endo/Heme/Allergies: Negative.   Psychiatric/Behavioral: Negative.     Objective:  Physical Exam  Constitutional: She is oriented to person, place, and time. She appears well-developed and well-nourished. No distress.  HENT:  Head: Normocephalic and atraumatic.  Right Ear: External ear normal.  Left Ear: External ear normal.  Nose: Nose normal.  Mouth/Throat: Oropharynx is clear and moist.  Eyes: Conjunctivae and EOM are normal.  Neck: Normal range of motion. Neck supple. No tracheal deviation present. No thyromegaly present.  Cardiovascular: Normal rate, regular rhythm, normal heart sounds and intact distal pulses.   No murmur heard. Respiratory: Effort normal and breath sounds normal. No respiratory distress. She has no wheezes. She exhibits no tenderness.  GI: Soft. Bowel sounds are normal. She exhibits no distension and no mass. There is no tenderness.  Musculoskeletal:       Right hip: She exhibits normal range of motion, normal  strength and no tenderness.       Left hip: She exhibits decreased range of motion, decreased strength and tenderness.       Right knee: She exhibits normal range of motion, no swelling and no erythema. No tenderness found.       Left knee: She exhibits normal range of motion, no swelling and no deformity. No tenderness found.       Right lower leg: She exhibits no tenderness and no swelling.       Left lower leg: She exhibits no tenderness and no swelling.       Legs: Lymphadenopathy:    She has no cervical adenopathy.  Neurological: She is alert and oriented to person, place, and time. She has normal strength and normal reflexes. No sensory deficit.  Skin: No rash noted. She is not diaphoretic. No erythema.  Psychiatric: She has a normal mood and affect. Her behavior is normal.  Vitals Pulse: 72 (Regular) BP: 174/74 (Sitting, Left Arm, Standard)  Estimated body mass index is 20.47 kg/(m^2) as calculated from the following:   Height as of 04/10/12: 5\' 5"  (1.651 m).   Weight as of 04/10/12: 55.792 kg (123 lb).  Imaging Review:  Plain radiographs demonstrate evidence of loosening of the femoral stem.The bone quality appears to be fair for age and reported activity level. The patient's bone scan is reviewed and she does have increased activity multiple spots on the femur around the stem. It is consistent with loosening. The acetabular side does not show any increased uptake. .  Assessment/Plan:  End stage arthritis, left hip(s) with failed previous arthroplasty.  The patient history, physical examination, clinical judgement of the provider and imaging studies are consistent with end stage degenerative joint disease of the left hip(s), previous total hip arthroplasty. Revision total hip arthroplasty is deemed medically necessary. The treatment options including medical management, injection therapy, arthroscopy and arthroplasty were discussed at length. The risks and benefits of  total hip arthroplasty were presented and reviewed. The risks due to aseptic loosening, infection, stiffness, dislocation/subluxation,  thromboembolic complications and other imponderables were discussed.  The patient acknowledged the explanation, agreed to proceed with the plan and consent was signed. Patient is being admitted for inpatient treatment for surgery, pain control, PT, OT, prophylactic antibiotics, VTE prophylaxis, progressive ambulation and ADL's and discharge planning. The patient is planning to be discharged home with home health services       [place], [place]

## 2012-06-27 ENCOUNTER — Inpatient Hospital Stay (HOSPITAL_COMMUNITY): Payer: Medicare Other

## 2012-06-27 ENCOUNTER — Encounter (HOSPITAL_COMMUNITY): Payer: Self-pay | Admitting: *Deleted

## 2012-06-27 ENCOUNTER — Inpatient Hospital Stay (HOSPITAL_COMMUNITY)
Admission: RE | Admit: 2012-06-27 | Discharge: 2012-06-30 | DRG: 467 | Disposition: A | Discharge status: Home Health | Payer: Medicare Other | Source: Ambulatory Visit | Attending: Orthopedic Surgery | Admitting: Orthopedic Surgery

## 2012-06-27 ENCOUNTER — Encounter (HOSPITAL_COMMUNITY): Payer: Self-pay | Admitting: Certified Registered Nurse Anesthetist

## 2012-06-27 ENCOUNTER — Inpatient Hospital Stay (HOSPITAL_COMMUNITY): Payer: Medicare Other | Admitting: Certified Registered Nurse Anesthetist

## 2012-06-27 ENCOUNTER — Encounter (HOSPITAL_COMMUNITY)
Admission: RE | Disposition: A | Discharge status: Home Health | Payer: Self-pay | Source: Ambulatory Visit | Attending: Orthopedic Surgery

## 2012-06-27 DIAGNOSIS — D62 Acute posthemorrhagic anemia: Secondary | ICD-10-CM

## 2012-06-27 DIAGNOSIS — Z853 Personal history of malignant neoplasm of breast: Secondary | ICD-10-CM

## 2012-06-27 DIAGNOSIS — E871 Hypo-osmolality and hyponatremia: Secondary | ICD-10-CM

## 2012-06-27 DIAGNOSIS — Y831 Surgical operation with implant of artificial internal device as the cause of abnormal reaction of the patient, or of later complication, without mention of misadventure at the time of the procedure: Secondary | ICD-10-CM | POA: Diagnosis present

## 2012-06-27 DIAGNOSIS — T84039A Mechanical loosening of unspecified internal prosthetic joint, initial encounter: Principal | ICD-10-CM | POA: Diagnosis present

## 2012-06-27 DIAGNOSIS — T84018A Broken internal joint prosthesis, other site, initial encounter: Secondary | ICD-10-CM

## 2012-06-27 DIAGNOSIS — Z96649 Presence of unspecified artificial hip joint: Secondary | ICD-10-CM

## 2012-06-27 DIAGNOSIS — I10 Essential (primary) hypertension: Secondary | ICD-10-CM | POA: Diagnosis present

## 2012-06-27 DIAGNOSIS — E039 Hypothyroidism, unspecified: Secondary | ICD-10-CM | POA: Diagnosis present

## 2012-06-27 DIAGNOSIS — Z9289 Personal history of other medical treatment: Secondary | ICD-10-CM

## 2012-06-27 DIAGNOSIS — M978XXA Periprosthetic fracture around other internal prosthetic joint, initial encounter: Secondary | ICD-10-CM

## 2012-06-27 DIAGNOSIS — T8484XA Pain due to internal orthopedic prosthetic devices, implants and grafts, initial encounter: Secondary | ICD-10-CM

## 2012-06-27 HISTORY — PX: TOTAL HIP REVISION: SHX763

## 2012-06-27 SURGERY — TOTAL HIP REVISION
Anesthesia: Spinal | Site: Hip | Laterality: Left | Wound class: Clean

## 2012-06-27 MED ORDER — METOCLOPRAMIDE HCL 10 MG PO TABS: 5.0000 mg | ORAL_TABLET | Freq: Three times a day (TID) | ORAL | Status: DC | PRN

## 2012-06-27 MED ORDER — SODIUM CHLORIDE 0.9 % IJ SOLN
INTRAMUSCULAR | Status: DC | PRN
  Administered 2012-06-27: 50 mL

## 2012-06-27 MED ORDER — DEXAMETHASONE 6 MG PO TABS
10.0000 mg | ORAL_TABLET | Freq: Once | ORAL | Status: AC
  Administered 2012-06-28: 10 mg via ORAL
  Filled 2012-06-27: qty 1

## 2012-06-27 MED ORDER — FENTANYL CITRATE 0.05 MG/ML IJ SOLN
INTRAMUSCULAR | Status: DC | PRN
  Administered 2012-06-27: 50 ug via INTRAVENOUS

## 2012-06-27 MED ORDER — ACETAMINOPHEN 650 MG RE SUPP: 650.0000 mg | Freq: Four times a day (QID) | RECTAL | Status: DC | PRN

## 2012-06-27 MED ORDER — PROMETHAZINE HCL 25 MG/ML IJ SOLN: 6.2500 mg | INTRAMUSCULAR | Status: DC | PRN

## 2012-06-27 MED ORDER — CEFAZOLIN SODIUM 1-5 GM-% IV SOLN
1.0000 g | Freq: Four times a day (QID) | INTRAVENOUS | Status: AC
  Administered 2012-06-27 – 2012-06-28 (×2): 1 g via INTRAVENOUS
  Filled 2012-06-27 (×2): qty 50

## 2012-06-27 MED ORDER — ONDANSETRON HCL 4 MG/2ML IJ SOLN: 4.0000 mg | Freq: Four times a day (QID) | INTRAMUSCULAR | Status: DC | PRN

## 2012-06-27 MED ORDER — BUPIVACAINE LIPOSOME 1.3 % IJ SUSP
20.0000 mL | Freq: Once | INTRAMUSCULAR | Status: AC
  Administered 2012-06-27: 20 mL
  Filled 2012-06-27: qty 20

## 2012-06-27 MED ORDER — EPHEDRINE SULFATE 50 MG/ML IJ SOLN
INTRAMUSCULAR | Status: DC | PRN
  Administered 2012-06-27 (×2): 5 mg via INTRAVENOUS

## 2012-06-27 MED ORDER — FLEET ENEMA 7-19 GM/118ML RE ENEM: 1.0000 | ENEMA | Freq: Once | RECTAL | Status: AC | PRN

## 2012-06-27 MED ORDER — ATROPINE SULFATE 0.4 MG/ML IJ SOLN
INTRAMUSCULAR | Status: DC | PRN
  Administered 2012-06-27: 0.4 mg via INTRAVENOUS

## 2012-06-27 MED ORDER — RIVAROXABAN 10 MG PO TABS
10.0000 mg | ORAL_TABLET | Freq: Every day | ORAL | Status: DC
  Administered 2012-06-28 – 2012-06-30 (×3): 10 mg via ORAL
  Filled 2012-06-27 (×4): qty 1

## 2012-06-27 MED ORDER — MENTHOL 3 MG MT LOZG: 1.0000 | LOZENGE | OROMUCOSAL | Status: DC | PRN

## 2012-06-27 MED ORDER — MORPHINE SULFATE 2 MG/ML IJ SOLN
1.0000 mg | INTRAMUSCULAR | Status: DC | PRN
  Administered 2012-06-27: 1 mg via INTRAVENOUS
  Administered 2012-06-28: 2 mg via INTRAVENOUS
  Filled 2012-06-27 (×2): qty 1

## 2012-06-27 MED ORDER — METHOCARBAMOL 100 MG/ML IJ SOLN
500.0000 mg | Freq: Four times a day (QID) | INTRAVENOUS | Status: DC | PRN
  Administered 2012-06-27: 500 mg via INTRAVENOUS
  Filled 2012-06-27: qty 5

## 2012-06-27 MED ORDER — HEPARIN SODIUM (PORCINE) 20000 UNIT/ML IJ SOLN
30000.0000 [IU] | Freq: Once | INTRAMUSCULAR | Status: DC
  Filled 2012-06-27: qty 1.5

## 2012-06-27 MED ORDER — PHENOL 1.4 % MT LIQD: 1.0000 | OROMUCOSAL | Status: DC | PRN

## 2012-06-27 MED ORDER — PROPOFOL INFUSION 10 MG/ML OPTIME
INTRAVENOUS | Status: DC | PRN
  Administered 2012-06-27: 140 ug/kg/min via INTRAVENOUS

## 2012-06-27 MED ORDER — PROPOFOL 10 MG/ML IV BOLUS
INTRAVENOUS | Status: DC | PRN
  Administered 2012-06-27: 20 mg via INTRAVENOUS
  Administered 2012-06-27: 30 mg via INTRAVENOUS

## 2012-06-27 MED ORDER — ACETAMINOPHEN 10 MG/ML IV SOLN
INTRAVENOUS | Status: DC | PRN
  Administered 2012-06-27: 1000 mg via INTRAVENOUS

## 2012-06-27 MED ORDER — ACETAMINOPHEN 325 MG PO TABS
650.0000 mg | ORAL_TABLET | Freq: Four times a day (QID) | ORAL | Status: DC | PRN
  Administered 2012-06-29 – 2012-06-30 (×5): 650 mg via ORAL
  Filled 2012-06-27 (×5): qty 2

## 2012-06-27 MED ORDER — LACTATED RINGERS IV SOLN
INTRAVENOUS | Status: DC
  Administered 2012-06-27: 1000 mL via INTRAVENOUS
  Administered 2012-06-27: 17:00:00 via INTRAVENOUS

## 2012-06-27 MED ORDER — METOCLOPRAMIDE HCL 5 MG/ML IJ SOLN
5.0000 mg | Freq: Three times a day (TID) | INTRAMUSCULAR | Status: DC | PRN
  Administered 2012-06-27: 10 mg via INTRAVENOUS
  Filled 2012-06-27: qty 2

## 2012-06-27 MED ORDER — CEFAZOLIN SODIUM-DEXTROSE 2-3 GM-% IV SOLR
2.0000 g | INTRAVENOUS | Status: AC
  Administered 2012-06-27: 2 mg via INTRAVENOUS

## 2012-06-27 MED ORDER — DOCUSATE SODIUM 100 MG PO CAPS
100.0000 mg | ORAL_CAPSULE | Freq: Two times a day (BID) | ORAL | Status: DC
  Administered 2012-06-27 – 2012-06-30 (×6): 100 mg via ORAL

## 2012-06-27 MED ORDER — LEVOTHYROXINE SODIUM 125 MCG PO TABS
125.0000 ug | ORAL_TABLET | Freq: Every day | ORAL | Status: DC
  Administered 2012-06-28 – 2012-06-30 (×3): 125 ug via ORAL
  Filled 2012-06-27 (×4): qty 1

## 2012-06-27 MED ORDER — DEXTROSE-NACL 5-0.9 % IV SOLN
INTRAVENOUS | Status: DC
  Administered 2012-06-28: 20:00:00 via INTRAVENOUS

## 2012-06-27 MED ORDER — SODIUM CHLORIDE 0.9 % IV SOLN
INTRAVENOUS | Status: DC
  Administered 2012-06-27: 21:00:00 via INTRAVENOUS

## 2012-06-27 MED ORDER — LACTATED RINGERS IV SOLN: INTRAVENOUS | Status: DC

## 2012-06-27 MED ORDER — METHOCARBAMOL 500 MG PO TABS: 500.0000 mg | ORAL_TABLET | Freq: Four times a day (QID) | ORAL | Status: DC | PRN

## 2012-06-27 MED ORDER — MEPERIDINE HCL 50 MG/ML IJ SOLN: 6.2500 mg | INTRAMUSCULAR | Status: DC | PRN

## 2012-06-27 MED ORDER — DIPHENHYDRAMINE HCL 12.5 MG/5ML PO ELIX: 12.5000 mg | ORAL_SOLUTION | ORAL | Status: DC | PRN

## 2012-06-27 MED ORDER — 0.9 % SODIUM CHLORIDE (POUR BTL) OPTIME
TOPICAL | Status: DC | PRN
  Administered 2012-06-27: 1000 mL

## 2012-06-27 MED ORDER — OXYCODONE HCL 5 MG PO TABS
5.0000 mg | ORAL_TABLET | ORAL | Status: DC | PRN
  Filled 2012-06-27: qty 1

## 2012-06-27 MED ORDER — LACTATED RINGERS IV SOLN
INTRAVENOUS | Status: DC | PRN
  Administered 2012-06-27 (×2): via INTRAVENOUS

## 2012-06-27 MED ORDER — ONDANSETRON HCL 4 MG PO TABS: 4.0000 mg | ORAL_TABLET | Freq: Four times a day (QID) | ORAL | Status: DC | PRN

## 2012-06-27 MED ORDER — ACETAMINOPHEN 10 MG/ML IV SOLN: 1000.0000 mg | Freq: Once | INTRAVENOUS | Status: DC

## 2012-06-27 MED ORDER — DEXAMETHASONE SODIUM PHOSPHATE 10 MG/ML IJ SOLN: 10.0000 mg | Freq: Once | INTRAMUSCULAR | Status: AC

## 2012-06-27 MED ORDER — POLYETHYLENE GLYCOL 3350 17 G PO PACK: 17.0000 g | PACK | Freq: Every day | ORAL | Status: DC | PRN

## 2012-06-27 MED ORDER — CHLORHEXIDINE GLUCONATE 4 % EX LIQD
60.0000 mL | Freq: Once | CUTANEOUS | Status: DC
  Filled 2012-06-27: qty 60

## 2012-06-27 MED ORDER — BISACODYL 10 MG RE SUPP: 10.0000 mg | Freq: Every day | RECTAL | Status: DC | PRN

## 2012-06-27 MED ORDER — ACETAMINOPHEN 10 MG/ML IV SOLN
1000.0000 mg | Freq: Four times a day (QID) | INTRAVENOUS | Status: AC
  Administered 2012-06-27 – 2012-06-28 (×4): 1000 mg via INTRAVENOUS
  Filled 2012-06-27 (×6): qty 100

## 2012-06-27 MED ORDER — HYDROMORPHONE HCL PF 1 MG/ML IJ SOLN: 0.2500 mg | INTRAMUSCULAR | Status: DC | PRN

## 2012-06-27 MED ORDER — AMLODIPINE BESYLATE 5 MG PO TABS
5.0000 mg | ORAL_TABLET | Freq: Every day | ORAL | Status: DC
  Administered 2012-06-29 – 2012-06-30 (×2): 5 mg via ORAL
  Filled 2012-06-27 (×3): qty 1

## 2012-06-27 MED ORDER — BUPIVACAINE HCL (PF) 0.5 % IJ SOLN
INTRAMUSCULAR | Status: DC | PRN
  Administered 2012-06-27: 3 mL

## 2012-06-27 MED ORDER — MIDAZOLAM HCL 5 MG/5ML IJ SOLN
INTRAMUSCULAR | Status: DC | PRN
  Administered 2012-06-27: 1 mg via INTRAVENOUS

## 2012-06-27 MED ORDER — SODIUM CHLORIDE 0.9 % IV BOLUS (SEPSIS)
500.0000 mL | Freq: Once | INTRAVENOUS | Status: AC
  Administered 2012-06-27: 500 mL via INTRAVENOUS

## 2012-06-27 MED ORDER — TRAMADOL HCL 50 MG PO TABS
50.0000 mg | ORAL_TABLET | Freq: Four times a day (QID) | ORAL | Status: DC | PRN
  Administered 2012-06-28 – 2012-06-30 (×2): 50 mg via ORAL
  Filled 2012-06-27: qty 2
  Filled 2012-06-27: qty 1

## 2012-06-27 SURGICAL SUPPLY — 73 items
BAG ZIPLOCK 12X15 (MISCELLANEOUS) ×6 IMPLANT
BALL HIP 32MM PLUS 3 (Hips) ×1 IMPLANT
BIT DRILL 2.8X128 (BIT) ×2 IMPLANT
BLADE EXTENDED COATED 6.5IN (ELECTRODE) ×2 IMPLANT
BLADE SAW SAG 73X25 THK (BLADE) ×1
BLADE SAW SGTL 73X25 THK (BLADE) ×1 IMPLANT
CABLE (Orthopedic Implant) ×6 IMPLANT
CLOTH BEACON ORANGE TIMEOUT ST (SAFETY) ×2 IMPLANT
CONT SPECI 4OZ STER CLIK (MISCELLANEOUS) IMPLANT
DRAPE INCISE IOBAN 66X45 STRL (DRAPES) ×2 IMPLANT
DRAPE ORTHO SPLIT 77X108 STRL (DRAPES) ×2
DRAPE POUCH INSTRU U-SHP 10X18 (DRAPES) ×2 IMPLANT
DRAPE SURG ORHT 6 SPLT 77X108 (DRAPES) ×2 IMPLANT
DRAPE U-SHAPE 47X51 STRL (DRAPES) ×2 IMPLANT
DRSG EMULSION OIL 3X16 NADH (GAUZE/BANDAGES/DRESSINGS) ×2 IMPLANT
DRSG MEPILEX BORDER 4X12 (GAUZE/BANDAGES/DRESSINGS) ×2 IMPLANT
DRSG MEPILEX BORDER 4X4 (GAUZE/BANDAGES/DRESSINGS) IMPLANT
DRSG MEPILEX BORDER 4X8 (GAUZE/BANDAGES/DRESSINGS) ×2 IMPLANT
DURAPREP 26ML APPLICATOR (WOUND CARE) ×2 IMPLANT
ELECT REM PT RETURN 9FT ADLT (ELECTROSURGICAL) ×2
ELECTRODE REM PT RTRN 9FT ADLT (ELECTROSURGICAL) ×1 IMPLANT
EVACUATOR 1/8 PVC DRAIN (DRAIN) ×2 IMPLANT
FACESHIELD LNG OPTICON STERILE (SAFETY) ×12 IMPLANT
GLOVE BIO SURGEON STRL SZ7.5 (GLOVE) ×4 IMPLANT
GLOVE BIO SURGEON STRL SZ8 (GLOVE) ×4 IMPLANT
GLOVE BIOGEL M 8.0 STRL (GLOVE) ×2 IMPLANT
GLOVE BIOGEL PI IND STRL 6 (GLOVE) ×1 IMPLANT
GLOVE BIOGEL PI IND STRL 7.0 (GLOVE) ×1 IMPLANT
GLOVE BIOGEL PI IND STRL 7.5 (GLOVE) ×3 IMPLANT
GLOVE BIOGEL PI IND STRL 8 (GLOVE) ×2 IMPLANT
GLOVE BIOGEL PI INDICATOR 6 (GLOVE) ×1
GLOVE BIOGEL PI INDICATOR 7.0 (GLOVE) ×1
GLOVE BIOGEL PI INDICATOR 7.5 (GLOVE) ×3
GLOVE BIOGEL PI INDICATOR 8 (GLOVE) ×2
GLOVE SURG SS PI 6.5 STRL IVOR (GLOVE) ×6 IMPLANT
GLOVE SURG SS PI 7.5 STRL IVOR (GLOVE) ×6 IMPLANT
GOWN STRL NON-REIN LRG LVL3 (GOWN DISPOSABLE) ×6 IMPLANT
GOWN STRL REIN 2XL XLG LVL4 (GOWN DISPOSABLE) ×4 IMPLANT
GOWN STRL REIN XL XLG (GOWN DISPOSABLE) ×6 IMPLANT
HIP BALL 32MM PLUS 3 (Hips) ×2 IMPLANT
HIP STEM SROM LG 20X15X225L (Hips) ×2 IMPLANT
IMMOBILIZER KNEE 20 (SOFTGOODS) ×2
IMMOBILIZER KNEE 20 THIGH 36 (SOFTGOODS) ×1 IMPLANT
KIT BASIN OR (CUSTOM PROCEDURE TRAY) ×2 IMPLANT
LINER MARATHON 32 50 (Hips) ×2 IMPLANT
MANIFOLD NEPTUNE II (INSTRUMENTS) ×2 IMPLANT
NDL SAFETY ECLIPSE 18X1.5 (NEEDLE) ×1 IMPLANT
NEEDLE HYPO 18GX1.5 SHARP (NEEDLE) ×1
NS IRRIG 1000ML POUR BTL (IV SOLUTION) ×2 IMPLANT
PACK TOTAL JOINT (CUSTOM PROCEDURE TRAY) ×2 IMPLANT
PASSER SUT SWANSON 36MM LOOP (INSTRUMENTS) ×2 IMPLANT
POSITIONER SURGICAL ARM (MISCELLANEOUS) ×2 IMPLANT
SLEEVE FEM PROX 20F XXL (Hips) ×2 IMPLANT
SPONGE GAUZE 4X4 12PLY (GAUZE/BANDAGES/DRESSINGS) ×2 IMPLANT
SPONGE LAP 18X18 X RAY DECT (DISPOSABLE) ×4 IMPLANT
STAPLER VISISTAT 35W (STAPLE) IMPLANT
STEM HIP SROM LG 20X15X225L (Hips) ×1 IMPLANT
STRIP CLOSURE SKIN 1/2X4 (GAUZE/BANDAGES/DRESSINGS) ×2 IMPLANT
SUCTION FRAZIER TIP 10 FR DISP (SUCTIONS) ×2 IMPLANT
SUT ETHIBOND NAB CT1 #1 30IN (SUTURE) ×2 IMPLANT
SUT MNCRL AB 4-0 PS2 18 (SUTURE) ×2 IMPLANT
SUT VIC AB 1 CT1 27 (SUTURE) ×3
SUT VIC AB 1 CT1 27XBRD ANTBC (SUTURE) ×3 IMPLANT
SUT VIC AB 2-0 CT1 27 (SUTURE) ×6
SUT VIC AB 2-0 CT1 TAPERPNT 27 (SUTURE) ×6 IMPLANT
SUT VLOC 180 0 24IN GS25 (SUTURE) ×4 IMPLANT
SWAB COLLECTION DEVICE MRSA (MISCELLANEOUS) IMPLANT
SYR 50ML LL SCALE MARK (SYRINGE) IMPLANT
TOWEL OR 17X26 10 PK STRL BLUE (TOWEL DISPOSABLE) ×4 IMPLANT
TRAY FOLEY CATH 14FRSI W/METER (CATHETERS) ×2 IMPLANT
TUBE ANAEROBIC SPECIMEN COL (MISCELLANEOUS) IMPLANT
WATER STERILE IRR 1500ML POUR (IV SOLUTION) ×4 IMPLANT
YANKAUER SUCT BULB TIP NO VENT (SUCTIONS) ×2 IMPLANT

## 2012-06-27 NOTE — Interval H&P Note (Signed)
History and Physical Interval Note:  06/27/2012 2:37 PM  [name]  has presented today for surgery, with the diagnosis of loose left total hip arthroplasty   The various methods of treatment have been discussed with the patient and family. After consideration of risks, benefits and other options for treatment, the patient has consented to  Procedure(s): LEFT TOTAL HIP REVISION (Left) as a surgical intervention .  The patient's history has been reviewed, patient examined, no change in status, stable for surgery.  I have reviewed the patient's chart and labs.  Questions were answered to the patient's satisfaction.     Loanne Drilling

## 2012-06-27 NOTE — Brief Op Note (Signed)
06/27/2012  5:26 PM  PATIENT:  [name]  79 y.o. female  PRE-OPERATIVE DIAGNOSIS:  loose left total hip arthroplasty   POST-OPERATIVE DIAGNOSIS:  loose left total hip arthroplasty   PROCEDURE:  Procedure(s): LEFT TOTAL HIP REVISION (Left)  SURGEON:  Surgeon(s) and Role:    * Loanne Drilling, MD - Primary  PHYSICIAN ASSISTANT:   ASSISTANTS: Avel Peace, PA-C   ANESTHESIA:   spinal  EBL:  Total I/O In: 2000 [I.V.:2000] Out: 850 [Urine:500; Blood:350]  BLOOD ADMINISTERED:none  DRAINS: (Medium) Hemovact drain(s) in the left hip with  Suction Open   LOCAL MEDICATIONS USED:  OTHER Exparel  COUNTS:  YES  TOURNIQUET:  * No tourniquets in log *  DICTATION: .Other Dictation: Dictation Number [id number]  PLAN OF CARE: Admit to inpatient   PATIENT DISPOSITION:  PACU - hemodynamically stable.

## 2012-06-27 NOTE — Progress Notes (Signed)
06/27/12 Nursing 2320 Avel Peace PA called reg: pt's pulse 39-40/ bp 66/39 at this time. Order received for ns bolus 500cc now then ns 125/h x 4 hrs, then ns 75/h. Will monitor bp and pulse after boluses.

## 2012-06-27 NOTE — Anesthesia Procedure Notes (Signed)
Spinal  Patient location during procedure: OR Staffing Anesthesiologist: Izumi Mixon Performed by: anesthesiologist  Preanesthetic Checklist Completed: patient identified, site marked, surgical consent, pre-op evaluation, timeout performed, IV checked, risks and benefits discussed and monitors and equipment checked Spinal Block Patient position: sitting Prep: Betadine Patient monitoring: heart rate, continuous pulse ox and blood pressure Approach: right paramedian Location: L2-3 Injection technique: single-shot Needle Needle type: Spinocan  Needle gauge: 22 G Needle length: 9 cm Additional Notes Expiration date of kit checked and confirmed. Patient tolerated procedure well, without complications.     

## 2012-06-27 NOTE — Anesthesia Preprocedure Evaluation (Addendum)
Anesthesia Evaluation  Patient identified by MRN, date of birth, ID band Patient awake    Reviewed: Allergy & Precautions, H&P , NPO status , Patient's Chart, lab work & pertinent test results  History of Anesthesia Complications Negative for: history of anesthetic complications  Airway Mallampati: II TM Distance: >3 FB Neck ROM: full    Dental no notable dental hx. (+) Caps, Dental Advisory Given and Teeth Intact,    Pulmonary neg pulmonary ROS,  breath sounds clear to auscultation  Pulmonary exam normal       Cardiovascular Exercise Tolerance: Good hypertension, Pt. on medications Rhythm:regular Rate:Normal     Neuro/Psych negative neurological ROS  negative psych ROS   GI/Hepatic negative GI ROS, Neg liver ROS,   Endo/Other  Hypothyroidism   Renal/GU negative Renal ROS  negative genitourinary   Musculoskeletal   Abdominal   Peds  Hematology negative hematology ROS (+)   Anesthesia Other Findings   Reproductive/Obstetrics negative OB ROS                          Anesthesia Physical  Anesthesia Plan  ASA: II  Anesthesia Plan: Spinal   Post-op Pain Management:    Induction: Intravenous  Airway Management Planned: Simple Face Mask  Additional Equipment:   Intra-op Plan:   Post-operative Plan: Extubation in OR  Informed Consent: I have reviewed the patients History and Physical, chart, labs and discussed the procedure including the risks, benefits and alternatives for the proposed anesthesia with the patient or authorized representative who has indicated his/her understanding and acceptance.   Dental advisory given  Plan Discussed with: CRNA  Anesthesia Plan Comments:         Anesthesia Quick Evaluation

## 2012-06-27 NOTE — Preoperative (Signed)
Beta Blockers   Reason not to administer Beta Blockers:Not Applicable 

## 2012-06-27 NOTE — Transfer of Care (Signed)
Immediate Anesthesia Transfer of Care Note  Patient: [name]  Procedure(s) Performed: Procedure(s): LEFT TOTAL HIP REVISION (Left)  Patient Location: PACU  Anesthesia Type:Spinal  Level of Consciousness: awake, alert  and oriented  Airway & Oxygen Therapy: Patient Spontanous Breathing and Patient connected to face mask oxygen  Post-op Assessment: Report given to PACU RN and Post -op Vital signs reviewed and stable  Post vital signs: Reviewed and stable  Complications: No apparent anesthesia complications

## 2012-06-28 DIAGNOSIS — D62 Acute posthemorrhagic anemia: Secondary | ICD-10-CM

## 2012-06-28 LAB — CBC
HCT: 25.6 % — ABNORMAL LOW (ref 36.0–46.0)
Hemoglobin: 8.7 g/dL — ABNORMAL LOW (ref 12.0–15.0)
MCH: 29.6 pg (ref 26.0–34.0)
MCHC: 34 g/dL (ref 30.0–36.0)
MCV: 87.1 fL (ref 78.0–100.0)
Platelets: 176 10*3/uL (ref 150–400)
RBC: 2.94 MIL/uL — ABNORMAL LOW (ref 3.87–5.11)
RDW: 13.8 % (ref 11.5–15.5)
WBC: 10.1 10*3/uL (ref 4.0–10.5)

## 2012-06-28 LAB — BASIC METABOLIC PANEL
Calcium: 8 mg/dL — ABNORMAL LOW (ref 8.4–10.5)
GFR calc non Af Amer: 85 mL/min — ABNORMAL LOW (ref >90)
Glucose, Bld: 162 mg/dL — ABNORMAL HIGH (ref 70–99)
Sodium: 134 mEq/L — ABNORMAL LOW (ref 135–145)

## 2012-06-28 MED ORDER — TRAMADOL HCL 50 MG PO TABS: 50.0000 mg | ORAL_TABLET | Freq: Four times a day (QID) | ORAL | Status: DC | PRN

## 2012-06-28 MED ORDER — RIVAROXABAN 10 MG PO TABS: 10.0000 mg | ORAL_TABLET | Freq: Every day | ORAL | Status: DC

## 2012-06-28 MED ORDER — METHOCARBAMOL 500 MG PO TABS: 500.0000 mg | ORAL_TABLET | Freq: Four times a day (QID) | ORAL | Status: DC | PRN

## 2012-06-28 MED ORDER — SODIUM CHLORIDE 0.9 % IV SOLN
INTRAVENOUS | Status: AC
  Administered 2012-06-28: 14:00:00 via INTRAVENOUS

## 2012-06-28 MED ORDER — SODIUM CHLORIDE 0.9 % IV BOLUS (SEPSIS)
250.0000 mL | Freq: Once | INTRAVENOUS | Status: AC
  Administered 2012-06-28: 250 mL via INTRAVENOUS

## 2012-06-28 MED ORDER — OXYCODONE HCL 5 MG PO TABS: 5.0000 mg | ORAL_TABLET | ORAL | Status: DC | PRN

## 2012-06-28 NOTE — Op Note (Signed)
[name], [name] NO.:  [id number]  MEDICAL RECORD NO.:  [id number]  LOCATION:  1614                         FACILITY:  [facility]  PHYSICIAN:  Ollen Gross, M.D.    DATE OF BIRTH:  12-27-1931  DATE OF PROCEDURE:  06/27/2012 DATE OF DISCHARGE:                              OPERATIVE REPORT   PREOPERATIVE DIAGNOSIS:  Failed left total hip arthroplasty secondary to aseptic loosening.  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  PROCEDURE:  Left total hip arthroplasty revision.  SURGEON:  Ollen Gross, M.D.  ASSISTANT:  Alexzandrew L. Perkins, P.A.C.  ANESTHESIA:  Spinal.  ESTIMATED BLOOD LOSS:  350 mL.  DRAINS:  Hemovac x1.  COMPLICATIONS:  None.  CONDITION:  Stable to recovery.  BRIEF CLINICAL NOTE:  [name] is a 79 year old female who had a left total hip arthroplasty done over 10 years ago.  She had a revision on the right side due to a nonunion of a periprosthetic fracture.  She thus was placed in much more stress on the left hip for quite a while.  She has gone on to develop severe left hip pain.  Bone scan was consistent with probable loosening of the femoral stem.  Plain film was equivocal for this.  Given her significant pain and dysfunction and positive bone scan, it was decided that we proceed with a revision of at least the femoral component.  She presents now for surgical revision.  PROCEDURE IN DETAIL:  After successful administration of spinal anesthetic, the patient was placed in right lateral decubitus position with the left side up and held with the hip positioner.  The left lower extremity was isolated from perineum with plastic drapes and prepped and draped in the usual sterile fashion.  A posterolateral incision was made with a 10 blade through subcutaneous tissue to the level of the fascia lata which was incised in line with the skin incision.  Sciatic nerve was palpated and  protected and a short rotators and pseudocapsule was removed off the posterior femur.  Hip joint centered.  There was minimal fluid in the joint.  There was minimal lytic debris.  The femoral head was then removed off the femoral component.  The component was not grossly loose.  I disrupted the interface between the collar and the femur and stem came out easily.  The cement mantle was intact.  We then removed the cement mantle piecemeal using the __________ extraction cement equipment.  I noted that there appeared to be a perforation of the femoral canal.  We removed the cement up to this perforation.  I decided to expose the femur distally, the incision was lengthened with a fresh 10 blade through the subcutaneous tissue to the fascia lata.  We incised in line with the incision.  We incised the fascia of the vastus lateralis and elevated the muscle posteriorly off the intramuscular septum.  It ended up being and it was beyond the perforation, and there was actually a fracture of the junction of the proximal and mid third of the femur.  Fortunately, this allowed for easy removal of the cement distally.  The cement was removed without  any further fracturing.  I then reamed distally with the flexible reamers up to 16.5 mm.  We had good purchase with 16.5 mm.  We then placed 3 Zimmer cables around the femur, and a wire to gather the 2 fragments.  This led to a very stable reduction of that oblique fracture.  We then went back up proximal and reamed with the flexible reamers up to 16.5 mm, which would allow for a 20 x 15 long S-ROM stem. I then reamed up to a 69F proximally and machined the sleeve to an extra- extra large.  Trial 69F extra-extra large sleeve was placed.  The femur was then retracted anteriorly for acetabular exposure.  We removed the soft tissue around the rim of the acetabulum and then removed the acetabular liner.  There was a 28 mm liner and I wanted to go to a 32 mm  head since this was a revision procedure and we needed enhanced stability.  A 50 mm pinnacle cup in good position.  We removed the liner and then placed a 32 mm neutral +4 marathon liner into the acetabular shell.  We then placed the long 20 x 15 with 36+8 neck, S-ROM stem which was the long on the left.  The hip was then reduced with a 32+0 head.  We needed a +3 head for better soft tissue tension.  A 32+3 soft tissue tension was excellent.  There was great stability with full extension, full external rotation, 70 degrees flexion, 40 degrees adduction, 90 degrees internal rotation, and 90 degrees of flexion and 70 degrees of internal rotation.  We are placing the left leg on top of the right.  The leg lengths are equal.  Hip was then dislocated and trials were removed. The permanent 69F extra-extra large sleeve was placed.  Then, a 20 x 15 long left stem with 36+8 neck was placed.  The stem ended up with the neck in about 20 degrees of anteversion.  A 32+3 metal head was placed. The hip was reduced in same stability parameters.  We took intraop x- ray, AP and lateral which showed that the stem was intact in the femur throughout its entire length with no perforation and no fracture.  Wound was then copiously irrigated with saline solution.  The fascia of the vastus lateralis was closed with running #1 Vicryl, and the posterior pseudocapsule reattached to the femur with interrupted 1 Ethibond.  A 20 mL of Exparel were mixed with 50 mL of saline and it was then injected into the subcu tissues, fascia lata, and gluteal muscles.  The wound was further irrigated with saline solution.  The fascia lata was then closed over Hemovac drain with a running #1 V-Loc suture.  The drain is found to be mobile.  Subcu was then closed in multiple layers with interrupted 2-0 Vicryl and skin closed with staples with running 4-0 Monocryl.  The drains hooked to suction.  Incision was cleaned and dried and  Steri- Strips and a bulky sterile dressing applied.  She was placed into a knee immobilizer, awakened, and transported to recovery in stable condition.  Please note that a surgical assistant was a medical necessity for this procedure in order to perform it in a safe and expeditious manner. Surgical assistant was necessary to retract the vital neurovascular structures to hold the femur reduced while I cabled the fracture and also to ensure that no further fracture occurred.     Ollen Gross, M.D.     FA/MEDQ  D:  06/27/2012  T:  06/28/2012  Job:  [id number]

## 2012-06-28 NOTE — Progress Notes (Signed)
Physical Therapy Treatment Patient Details Name: [name] MRN: [id number] DOB: [birth year] Today's Date: 06/28/2012 Time: 1340-1410 PT Time Calculation (min): 30 min  PT Assessment / Plan / Recommendation Comments on Treatment Session       Follow Up Recommendations  Home health PT     Does the patient have the potential to tolerate intense rehabilitation     Barriers to Discharge        Equipment Recommendations  None recommended by PT    Recommendations for Other Services OT consult  Frequency 7X/week   Plan Discharge plan remains appropriate    Precautions / Restrictions Precautions Precautions: Posterior Hip Precaution Comments: SIGN HUNG IN ROOM Restrictions Weight Bearing Restrictions: No Other Position/Activity Restrictions: WBAT   Pertinent Vitals/Pain 4/10; premed, ice pack provided    Mobility  Bed Mobility Bed Mobility: Sit to Supine Sit to Supine: 3: Mod assist Details for Bed Mobility Assistance: cues for sequence and use of L LE to self assist Transfers Transfers: Sit to Stand;Stand to Sit Sit to Stand: 3: Mod assist Stand to Sit: 3: Mod assist Details for Transfer Assistance: cues for LE management and use of UEs Ambulation/Gait Ambulation/Gait Assistance: 3: Mod assist Ambulation Distance (Feet): 49 Feet Assistive device: Rolling walker Ambulation/Gait Assistance Details: cues for posture, sequence, stride length, position from RW and ER on L Gait Pattern: Step-to pattern Stairs: No    Exercises     PT Diagnosis:    PT Problem List:   PT Treatment Interventions:     PT Goals Acute Rehab PT Goals PT Goal Formulation: With patient Time For Goal Achievement: 07/03/12 Potential to Achieve Goals: Good Pt will go Supine/Side to Sit: with supervision PT Goal: Supine/Side to Sit - Progress: Goal set today Pt will go Sit to Supine/Side: with supervision PT Goal: Sit to Supine/Side - Progress: Goal set today Pt will go Sit to Stand: with  supervision PT Goal: Sit to Stand - Progress: Goal set today Pt will go Stand to Sit: with supervision PT Goal: Stand to Sit - Progress: Goal set today Pt will Ambulate: 51 - 150 feet;with supervision;with rolling walker PT Goal: Ambulate - Progress: Goal set today  Visit Information  Last PT Received On: 06/28/12 Assistance Needed: +1    Subjective Data  Subjective: I can't beleive I can put wt on my leg again Patient Stated Goal: HOME asap   Cognition  Cognition Overall Cognitive Status: Appears within functional limits for tasks assessed/performed Arousal/Alertness: Awake/alert Orientation Level: Appears intact for tasks assessed Behavior During Session: [facility] for tasks performed    Balance     End of Session PT - End of Session Equipment Utilized During Treatment: Gait belt Activity Tolerance: Patient tolerated treatment well Patient left: in bed;with call bell/phone within reach Nurse Communication: Mobility status   GP     Sherlynn Tourville 06/28/2012, 3:21 PM

## 2012-06-28 NOTE — Progress Notes (Signed)
UR COMPLETED  

## 2012-06-28 NOTE — Evaluation (Signed)
Occupational Therapy Evaluation Patient Details Name: [name] MRN: [id number] DOB: 1931/06/22 Today's Date: 06/28/2012 Time: 1337-1400 OT Time Calculation (min): 23 min  OT Assessment / Plan / Recommendation Clinical Impression  This 79 year old female was admitted for L THA, posterior approach.  She has had multiple hip surgeries but would benefit from skilled OT to ensure safety/following thps with adls and bathroom transfers.      OT Assessment  Patient needs continued OT Services    Follow Up Recommendations  Home health OT    Barriers to Discharge      Equipment Recommendations  None recommended by OT    Recommendations for Other Services    Frequency  Min 2X/week    Precautions / Restrictions Precautions Precautions: Posterior Hip Precaution Comments: SIGN HUNG IN ROOM Restrictions Weight Bearing Restrictions: No Other Position/Activity Restrictions: WBAT   Pertinent Vitals/Pain No pain reported    ADL  Grooming: Simulated;Set up Where Assessed - Grooming: Supported sitting Upper Body Bathing: Simulated;Set up Where Assessed - Upper Body Bathing: Supported sitting Lower Body Bathing: Simulated;Minimal assistance (and mod A sit to stand) Where Assessed - Lower Body Bathing: Supported sit to stand Upper Body Dressing: Simulated;Set up Where Assessed - Upper Body Dressing: Supported sitting Lower Body Dressing: Performed;Minimal assistance (and mod A for sit to stand) Where Assessed - Lower Body Dressing: Supported sit to stand Toilet Transfer: Simulated;Moderate assistance (chair to bed, min A for steps) Toilet Transfer Method: Sit to stand Toileting - Clothing Manipulation and Hygiene: Simulated;Minimal assistance (mod A for sit to stand) Where Assessed - Toileting Clothing Manipulation and Hygiene: Standing Equipment Used: Sock aid;Reacher;Rolling walker Transfers/Ambulation Related to ADLs: ambulated with min A, min cues for internal rotation ADL  Comments: Pt needed cues not to help L LE up onto bed with R.  Her sock aid at home is terry cloth and she doesn't thread it completely on AE--had difficulty with molded one.  Recalled 2/3 THPS    OT Diagnosis: Generalized weakness  OT Problem List: Decreased strength;Decreased activity tolerance;Decreased knowledge of precautions OT Treatment Interventions: Self-care/ADL training;DME and/or AE instruction;Patient/family education   OT Goals Acute Rehab OT Goals OT Goal Formulation: With patient Time For Goal Achievement: 07/05/12 Potential to Achieve Goals: Good ADL Goals Pt Will Perform Lower Body Bathing: with min assist;Sit to stand from chair (min guard with ae, following thps) ADL Goal: Lower Body Bathing - Progress: Goal set today Pt Will Perform Lower Body Dressing: with min assist;Sit to stand from chair;with adaptive equipment (min guard, following thps) ADL Goal: Lower Body Dressing - Progress: Goal set today Pt Will Transfer to Toilet: with min assist;Ambulation;3-in-1 (min guard, no cues for thps) ADL Goal: Toilet Transfer - Progress: Goal set today  Visit Information  Last OT Received On: 06/28/12 Assistance Needed: +1 PT/OT Co-Evaluation/Treatment: Yes    Subjective Data  Subjective: I can pretty much do things for myself.  Oh, I have to remember that the L is the bad one now   Prior Functioning     Home Living Lives With: Alone Bathroom Shower/Tub: Engineer, manufacturing systems: Standard Home Adaptive Equipment: Walker - rolling Additional Comments: daughter is next door Prior Function Level of Independence: Independent;Independent with assistive device(s) Comments: pt will wait for therapy to practice tub transfers:  not ready yet Communication Communication: No difficulties Dominant Hand: Right         Vision/Perception     Cognition  Cognition Overall Cognitive Status: Appears within functional limits for  tasks  assessed/performed Arousal/Alertness: Awake/alert Orientation Level: Appears intact for tasks assessed Behavior During Session: [facility] for tasks performed    Extremity/Trunk Assessment Right Upper Extremity Assessment RUE ROM/Strength/Tone: [facility] for tasks assessed Left Upper Extremity Assessment LUE ROM/Strength/Tone: WFL for tasks assessed     Mobility Bed Mobility Bed Mobility: Sit to Supine Sit to Supine: 3: Mod assist Details for Bed Mobility Assistance: cues for sequence and use of L LE to self assist Transfers Sit to Stand: 3: Mod assist Stand to Sit: 3: Mod assist Details for Transfer Assistance: cues for LE management and use of UEs     Exercise     Balance     End of Session OT - End of Session Activity Tolerance: Patient tolerated treatment well Patient left: in bed;with call bell/phone within reach Nurse Communication:  (missing PAS wrap)  GO     Aloha Bartok 06/28/2012, 4:11 PM Marica Otter, OTR/L [phone] 06/28/2012

## 2012-06-28 NOTE — Anesthesia Postprocedure Evaluation (Signed)
  Anesthesia Post-op Note  Patient: [name]  Procedure(s) Performed: Procedure(s) (LRB): LEFT TOTAL HIP REVISION (Left)  Patient Location: PACU  Anesthesia Type: Spinal  Level of Consciousness: awake and alert   Airway and Oxygen Therapy: Patient Spontanous Breathing  Post-op Pain: mild  Post-op Assessment: Post-op Vital signs reviewed, Patient's Cardiovascular Status Stable, Respiratory Function Stable, Patent Airway and No signs of Nausea or vomiting  Last Vitals:  Filed Vitals:   06/28/12 0633  BP: 102/53  Pulse: 65  Temp: 37.3 C  Resp: 14    Post-op Vital Signs: stable   Complications: No apparent anesthesia complications

## 2012-06-28 NOTE — Progress Notes (Signed)
   Subjective: 1 Day Post-Op Procedure(s) (LRB): LEFT TOTAL HIP REVISION (Left) Patient reports pain as mild and moderate.   Patient seen in rounds with Dr. Lequita Halt.  The patient has issues of hypotension and bradycardia postop on the evening of surgery.  She responded to fluid resuscitation.  Monitor pressures today.  Up with therapy. Patient is well, but has had some minor complaints of pain in the hip and thigh, requiring pain medications We will start therapy today.  Plan is to go Home after hospital stay. Probably ready on Sunday.  Objective: Vital signs in last 24 hours: Temp:  [95.4 F (35.2 C)-99.1 F (37.3 C)] 99.1 F (37.3 C) (02/21 5409) Pulse Rate:  [37-70] 65 (02/21 0633) Resp:  [9-20] 14 (02/21 0633) BP: (66-173)/(38-114) 102/53 mmHg (02/21 0633) SpO2:  [87 %-100 %] 100 % (02/21 0633) FiO2 (%):  [28 %] 28 % (02/20 1936) Weight:  [55.339 kg (122 lb)] 55.339 kg (122 lb) (02/20 2102)  Intake/Output from previous day:  Intake/Output Summary (Last 24 hours) at 06/28/12 0926 Last data filed at 06/28/12 8119  Gross per 24 hour  Intake   3800 ml  Output   2535 ml  Net   1265 ml    Intake/Output this shift:    Labs:  Recent Labs  06/28/12 0413  HGB 8.7*    Recent Labs  06/28/12 0413  WBC 10.1  RBC 2.94*  HCT 25.6*  PLT 176    Recent Labs  06/28/12 0413  NA 134*  K 4.0  CL 103  CO2 25  BUN 10  CREATININE 0.60  GLUCOSE 162*  CALCIUM 8.0*   No results found for this basename: LABPT, INR,  in the last 72 hours  EXAM General - Patient is Alert, Appropriate and Oriented Extremity - Neurovascular intact Sensation intact distally Dorsiflexion/Plantar flexion intact Dressing - dressing C/D/I Motor Function - intact, moving foot and toes well on exam.  Hemovac pulled without difficulty.  Past Medical History  Diagnosis Date  . Hypothyroidism 3-21- 13    tx. levothyroxine  . Blood transfusion 04-10-2012    post surgery some time ago  .  Arthritis 07-27-11    osteoarthritis-hips/s/p bil. THA, arthritis right hand   . Hypertension   . Anemia     hx of with surgery   . Hard of hearing     both ears mild loss  . Complication of anesthesia     epinephrine - screams uncontrollably   . Cancer 07-27-11    Breast cancer -s/p lt. mastectomy, some squamous cell lesions    Assessment/Plan: 1 Day Post-Op Procedure(s) (LRB): LEFT TOTAL HIP REVISION (Left) Principal Problem:   Failed total hip arthroplasty Active Problems:   Postop Hyponatremia   Postoperative anemia due to acute blood loss  Estimated body mass index is 20.3 kg/(m^2) as calculated from the following:   Height as of this encounter: 5\' 5"  (1.651 m).   Weight as of this encounter: 55.339 kg (122 lb). Advance diet Up with therapy Discharge home with home health probably on Sunday.  DVT Prophylaxis - Xarelto Weight Bearing As Tolerated left Leg D/C Knee Immobilizer Hemovac Pulled Begin Therapy Hip Preacutions No vaccines.  Adra Shepler 06/28/2012, 9:26 AM

## 2012-06-28 NOTE — Progress Notes (Signed)
   Subjective: 1 Day Post-Op Procedure(s) (LRB): LEFT TOTAL HIP REVISION (Left) Patient reports pain as mild.   Patient seen in rounds with Dr. Lequita Halt. Patient is well, and has had no acute complaints or problems We will start therapy today.  Plan is to go Home after hospital stay.  Objective: Vital signs in last 24 hours: Temp:  [95.4 F (35.2 C)-99.1 F (37.3 C)] 99.1 F (37.3 C) (02/21 5784) Pulse Rate:  [37-70] 65 (02/21 0633) Resp:  [9-20] 14 (02/21 0633) BP: (66-173)/(38-114) 102/53 mmHg (02/21 0633) SpO2:  [87 %-100 %] 100 % (02/21 0633) FiO2 (%):  [28 %] 28 % (02/20 1936) Weight:  [55.339 kg (122 lb)] 55.339 kg (122 lb) (02/20 2102)  Intake/Output from previous day:  Intake/Output Summary (Last 24 hours) at 06/28/12 0724 Last data filed at 06/28/12 6962  Gross per 24 hour  Intake   3800 ml  Output   2535 ml  Net   1265 ml    Intake/Output this shift:    Labs:  Recent Labs  06/28/12 0413  HGB 8.7*    Recent Labs  06/28/12 0413  WBC 10.1  RBC 2.94*  HCT 25.6*  PLT 176    Recent Labs  06/28/12 0413  NA 134*  K 4.0  CL 103  CO2 25  BUN 10  CREATININE 0.60  GLUCOSE 162*  CALCIUM 8.0*   No results found for this basename: LABPT, INR,  in the last 72 hours  EXAM General - Patient is Alert, Appropriate and Oriented Extremity - Neurovascular intact Sensation intact distally Dorsiflexion/Plantar flexion intact Dressing - dressing C/D/I Motor Function - intact, moving foot and toes well on exam.  Hemovac pulled without difficulty.  Past Medical History  Diagnosis Date  . Hypothyroidism 3-21- 13    tx. levothyroxine  . Blood transfusion 04-10-2012    post surgery some time ago  . Arthritis 07-27-11    osteoarthritis-hips/s/p bil. THA, arthritis right hand   . Hypertension   . Anemia     hx of with surgery   . Hard of hearing     both ears mild loss  . Complication of anesthesia     epinephrine - screams uncontrollably   . Cancer  07-27-11    Breast cancer -s/p lt. mastectomy, some squamous cell lesions    Assessment/Plan: 1 Day Post-Op Procedure(s) (LRB): LEFT TOTAL HIP REVISION (Left) Principal Problem:   Failed total hip arthroplasty  Estimated body mass index is 20.3 kg/(m^2) as calculated from the following:   Height as of this encounter: 5\' 5"  (1.651 m).   Weight as of this encounter: 55.339 kg (122 lb). Up with therapy Discharge home with home health  DVT Prophylaxis - Xarelto Weight Bearing As Tolerated left Leg D/C Knee Immobilizer Hemovac Pulled Begin Therapy Hip Preacutions No vaccines.  Nary Sneed 06/28/2012, 7:24 AM

## 2012-06-28 NOTE — Evaluation (Signed)
Physical Therapy Evaluation Patient Details Name: COREAN YOSHIMURA MRN: [id number] DOB: [birth year] Today's Date: 06/28/2012 Time: 4098-1191 PT Time Calculation (min): 23 min  PT Assessment / Plan / Recommendation Clinical Impression  Pt s/p L THR REV presents with decreased L LE strength/ROM, post op pain and orthostatic BP limiting functional mobility    PT Assessment  Patient needs continued PT services    Follow Up Recommendations  Home health PT    Does the patient have the potential to tolerate intense rehabilitation      Barriers to Discharge None      Equipment Recommendations  None recommended by PT    Recommendations for Other Services OT consult   Frequency 7X/week    Precautions / Restrictions Precautions Precautions: Posterior Hip Restrictions Weight Bearing Restrictions: No Other Position/Activity Restrictions: WBAT   Pertinent Vitals/Pain Min c/o pain.  BP sitting 120/75, after standing 88/46      Mobility  Bed Mobility Bed Mobility: Not assessed (Pt on side of bed with nursing ) Transfers Transfers: Sit to Stand;Stand to Sit Sit to Stand: 1: +2 Total assist Sit to Stand: Patient Percentage: 70% Stand to Sit: 1: +2 Total assist Stand to Sit: Patient Percentage: 70% Details for Transfer Assistance: cues for LE management and use of UEs Ambulation/Gait Ambulation/Gait Assistance: 1: +2 Total assist Ambulation/Gait: Patient Percentage: 70% Ambulation Distance (Feet): 3 Feet Assistive device: Rolling walker Ambulation/Gait Assistance Details: cues for posture and position from RW Gait Pattern: Step-to pattern General Gait Details: ltd by dizziness with move to standing Stairs: No    Exercises     PT Diagnosis: Difficulty walking  PT Problem List: Decreased strength;Decreased range of motion;Decreased activity tolerance;Decreased mobility;Pain;Decreased knowledge of use of DME;Decreased knowledge of precautions PT Treatment Interventions: DME  instruction;Gait training;Stair training;Functional mobility training;Therapeutic activities;Therapeutic exercise;Patient/family education   PT Goals Acute Rehab PT Goals PT Goal Formulation: With patient Time For Goal Achievement: 07/03/12 Potential to Achieve Goals: Good Pt will go Supine/Side to Sit: with supervision PT Goal: Supine/Side to Sit - Progress: Goal set today Pt will go Sit to Supine/Side: with supervision PT Goal: Sit to Supine/Side - Progress: Goal set today Pt will go Sit to Stand: with supervision PT Goal: Sit to Stand - Progress: Goal set today Pt will go Stand to Sit: with supervision PT Goal: Stand to Sit - Progress: Goal set today Pt will Ambulate: 51 - 150 feet;with supervision;with rolling walker PT Goal: Ambulate - Progress: Goal set today  Visit Information  Last PT Received On: 06/28/12 Assistance Needed: +2    Subjective Data  Subjective: I was going to the gym with my walker and doing what I could Patient Stated Goal: HOME asap   Prior Functioning  Home Living Lives With: Alone Available Help at Discharge: Family Type of Home: House Home Access: Level entry Home Layout: Able to live on main level with bedroom/bathroom Home Adaptive Equipment: Walker - rolling Prior Function Level of Independence: Independent;Independent with assistive device(s) Driving: Yes Vocation: Retired Musician: No difficulties Dominant Hand: Right    Cognition  Cognition Overall Cognitive Status: Appears within functional limits for tasks assessed/performed Arousal/Alertness: Awake/alert Orientation Level: Appears intact for tasks assessed Behavior During Session: [facility] for tasks performed    Extremity/Trunk Assessment Right Upper Extremity Assessment RUE ROM/Strength/Tone: [facility] for tasks assessed Left Upper Extremity Assessment LUE ROM/Strength/Tone: [facility] for tasks assessed Right Lower Extremity Assessment RLE ROM/Strength/Tone: [facility] for tasks  assessed   Balance    End of Session PT -  End of Session Patient left: in chair;with call bell/phone within reach Nurse Communication: Mobility status;Other (comment) (BP)  GP     Jadie Comas 06/28/2012, 1:02 PM

## 2012-06-29 LAB — HEMOGLOBIN AND HEMATOCRIT, BLOOD
HCT: 22.1 % — ABNORMAL LOW (ref 36.0–46.0)
Hemoglobin: 7.3 g/dL — ABNORMAL LOW (ref 12.0–15.0)

## 2012-06-29 LAB — CBC
MCH: 28.7 pg (ref 26.0–34.0)
MCHC: 33.1 g/dL (ref 30.0–36.0)
Platelets: 174 10*3/uL (ref 150–400)
RBC: 2.72 MIL/uL — ABNORMAL LOW (ref 3.87–5.11)

## 2012-06-29 LAB — BASIC METABOLIC PANEL
CO2: 25 mEq/L (ref 19–32)
Calcium: 8.4 mg/dL (ref 8.4–10.5)
GFR calc non Af Amer: 88 mL/min — ABNORMAL LOW (ref >90)
Sodium: 134 mEq/L — ABNORMAL LOW (ref 135–145)

## 2012-06-29 NOTE — Progress Notes (Signed)
Subjective: 2 Days Post-Op Procedure(s) (LRB): LEFT TOTAL HIP REVISION (Left) Patient reports pain as 2 on 0-10 scale.  Dressing Changed and wound looks fine. Will ambulate. Hbg is 7.8. Will follow this afternoon and in A.M.  Objective: Vital signs in last 24 hours: Temp:  [98.5 F (36.9 C)-99.1 F (37.3 C)] 98.5 F (36.9 C) (02/22 0546) Pulse Rate:  [57-72] 72 (02/22 0546) Resp:  [16] 16 (02/22 0546) BP: (88-150)/(46-75) 150/75 mmHg (02/22 0546) SpO2:  [98 %-99 %] 99 % (02/22 0546)  Intake/Output from previous day: 02/21 0701 - 02/22 0700 In: 817.3 [P.O.:780; I.V.:37.3] Out: 2601 [Urine:2600; Stool:1] Intake/Output this shift: Total I/O In: 185.7 [I.V.:185.7] Out: 600 [Urine:600]   Recent Labs  06/28/12 0413 06/29/12 0508  HGB 8.7* 7.8*    Recent Labs  06/28/12 0413 06/29/12 0508  WBC 10.1 14.2*  RBC 2.94* 2.72*  HCT 25.6* 23.6*  PLT 176 174    Recent Labs  06/28/12 0413 06/29/12 0508  NA 134* 134*  K 4.0 4.4  CL 103 105  CO2 25 25  BUN 10 9  CREATININE 0.60 0.54  GLUCOSE 162* 110*  CALCIUM 8.0* 8.4   No results found for this basename: LABPT, INR,  in the last 72 hours  Neurologically intact No cellulitis present  Assessment/Plan: 2 Days Post-Op Procedure(s) (LRB): LEFT TOTAL HIP REVISION (Left) Up with therapy. Follow Hbg.  [name] A 06/29/2012, 8:25 AM

## 2012-06-29 NOTE — Progress Notes (Signed)
Occupational Therapy Treatment Patient Details Name: [name] MRN: [id number] DOB: 22-Oct-1931 Today's Date: 06/29/2012 Time: 1024-1100 OT Time Calculation (min): 36 min  OT Assessment / Plan / Recommendation    Follow Up Recommendations  Home health OT       Equipment Recommendations  None recommended by OT       Frequency Min 2X/week   Plan Discharge plan remains appropriate    Precautions / Restrictions Precautions Precautions: Posterior Hip Restrictions Weight Bearing Restrictions: No Other Position/Activity Restrictions: WBAT       ADL  Lower Body Bathing: Min guard;Other (comment);Simulated ( with AE. Pt has AE) Lower Body Dressing: Performed;Other (comment);Min guard (with AE) Where Assessed - Lower Body Dressing: Unsupported sit to stand Toilet Transfer: Performed;Supervision/safety Toilet Transfer Method: Sit to Barista: Comfort height toilet Toileting - Clothing Manipulation and Hygiene: Performed;Min guard Where Assessed - Engineer, mining and Hygiene: Standing Tub/Shower Transfer: Performed;Min guard Psychologist, educational: Transfer tub bench Transfers/Ambulation Related to ADLs: Pt did need cues for using AE.  Friend will obtain leg lifter for pt.        OT Goals ADL Goals ADL Goal: Lower Body Dressing - Progress: Progressing toward goals ADL Goal: Toilet Transfer - Progress: Progressing toward goals Pt Will Perform Tub/Shower Transfer: Tub transfer;with supervision ADL Goal: Tub/Shower Transfer - Progress: Goal set today  Visit Information  Last OT Received On: 06/29/12    Subjective Data  Subjective: my tub bench is not quite that long- but i can sit farther back      Cognition  Cognition Overall Cognitive Status: Appears within functional limits for tasks assessed/performed Arousal/Alertness: Awake/alert Orientation Level: Appears intact for tasks assessed Behavior During Session: [facility] for  tasks performed    Mobility  Transfers Transfers: Sit to Stand;Stand to Sit Sit to Stand: 4: Min guard;From chair/3-in-1;From toilet Stand to Sit: 4: Min guard;To toilet;To chair/3-in-1          End of Session OT - End of Session Activity Tolerance: Patient tolerated treatment well Patient left: in chair;with call bell/phone within reach  GO     [facility], [name] 06/29/2012, 11:32 AM

## 2012-06-29 NOTE — Plan of Care (Signed)
Problem: Phase III Progression Outcomes Goal: Anticoagulant follow-up in place Outcome: Not Applicable Date Met:  06/29/12 On xarelto

## 2012-06-29 NOTE — Progress Notes (Signed)
Physical Therapy Treatment Patient Details Name: [name] MRN: [id number] DOB: Aug 14, 1931 Today's Date: 06/29/2012 Time: 4098-1191 PT Time Calculation (min): 32 min  PT Assessment / Plan / Recommendation Comments on Treatment Session  POD # 2 am session L THRevision.  Pt very motivated and pleasant.  Assisted OOB to amb in hallway then perform TE's.  Pt progressing well.    Follow Up Recommendations  Home health PT     Does the patient have the potential to tolerate intense rehabilitation     Barriers to Discharge        Equipment Recommendations  None recommended by PT    Recommendations for Other Services    Frequency 7X/week   Plan Discharge plan remains appropriate    Precautions / Restrictions Precautions Precautions: Posterior Hip Precaution Comments: Pt recalls 3/3 THP Restrictions Weight Bearing Restrictions: No Other Position/Activity Restrictions: WBAT   Pertinent Vitals/Pain C/o 4/10 "tightness'    Mobility  Bed Mobility Bed Mobility: Supine to Sit Supine to Sit: 4: Min guard Details for Bed Mobility Assistance: Demonstarted and instructed pt how to use a belt to assist L LE off bed which she performed with increased time and 25% VC's on proper tech. Transfers Transfers: Sit to Stand;Stand to Sit Sit to Stand: 5: Supervision;4: Min guard Stand to Sit: 4: Min guard Details for Transfer Assistance: 25% VC's on safety with turns and increased time as pt demon good safety cognition Ambulation/Gait Ambulation/Gait Assistance: 4: Min assist Ambulation Distance (Feet): 87 Feet Assistive device: Rolling walker Ambulation/Gait Assistance Details: increased time and 25% VC's to increase L knee fleaxion as well as toe off and heel stike to facititate a more natural gait. Gait Pattern: Step-to pattern;Step-through pattern Gait velocity: decreaseed    Exercises Total Joint Exercises Ankle Circles/Pumps: AROM;Both;15 reps;Seated Quad Sets: AROM;Both;15  reps;Seated Gluteal Sets: AROM;Both;15 reps;Seated Short Arc Quad: AROM;Left;10 reps;Seated Heel Slides: AAROM;Left;10 reps;Supine Hip ABduction/ADduction: AAROM;Left;10 reps;Supine   PT Goals                                                    progressing    Visit Information  Last PT Received On: 06/29/12    Subjective Data      Cognition  Cognition Overall Cognitive Status: Appears within functional limits for tasks assessed/performed Arousal/Alertness: Awake/alert Orientation Level: Appears intact for tasks assessed Behavior During Session: [facility] for tasks performed    Balance     End of Session PT - End of Session Equipment Utilized During Treatment: Gait belt Activity Tolerance: Patient tolerated treatment well Patient left: in chair;with call bell/phone within reach;Other (comment) (ICE packe to L hip)   Felecia Shelling  PTA [facility]  Acute  Rehab Pager      [phone]

## 2012-06-29 NOTE — Progress Notes (Signed)
H&H shows drop. PA notified & order received to give two units PRBCs, already in lab. Yuridia Couts, [employer]

## 2012-06-30 LAB — TYPE AND SCREEN
ABO/RH(D): O POS
Antibody Screen: NEGATIVE
Unit division: 0
Unit division: 0

## 2012-06-30 LAB — CBC
HCT: 28.3 % — ABNORMAL LOW (ref 36.0–46.0)
Hemoglobin: 9.4 g/dL — ABNORMAL LOW (ref 12.0–15.0)
MCHC: 33.2 g/dL (ref 30.0–36.0)
MCV: 84.2 fL (ref 78.0–100.0)
RDW: 16.2 % — ABNORMAL HIGH (ref 11.5–15.5)

## 2012-06-30 NOTE — Progress Notes (Signed)
Physical Therapy Treatment Patient Details Name: [name] MRN: [id number] DOB: [birth year] Today's Date: 06/30/2012 Time: 9562-1308 PT Time Calculation (min): 24 min  PT Assessment / Plan / Recommendation Comments on Treatment Session  Pt progressing very well with mobility and able to state all exercises.  Ready for D/C today.     Follow Up Recommendations  Home health PT     Does the patient have the potential to tolerate intense rehabilitation     Barriers to Discharge        Equipment Recommendations  None recommended by PT    Recommendations for Other Services    Frequency 7X/week   Plan Discharge plan remains appropriate    Precautions / Restrictions Precautions Precautions: Posterior Hip Precaution Comments: Pt recalls 3/3 THP Restrictions Weight Bearing Restrictions: No Other Position/Activity Restrictions: WBAT   Pertinent Vitals/Pain 4/10 more "stiffness" than pain pt states.     Mobility  Bed Mobility Bed Mobility: Not assessed Transfers Transfers: Sit to Stand;Stand to Sit Sit to Stand: 6: Modified independent (Device/Increase time) Stand to Sit: 6: Modified independent (Device/Increase time) Ambulation/Gait Ambulation/Gait Assistance: 5: Supervision Ambulation Distance (Feet): 240 Feet Assistive device: Rolling walker Ambulation/Gait Assistance Details: Min cues for upright posture.  Gait Pattern: Step-to pattern;Step-through pattern Gait velocity: decreaseed    Exercises Total Joint Exercises Ankle Circles/Pumps: AROM;Both;20 reps Quad Sets: AROM;Both;10 reps Gluteal Sets: Strengthening;Both;10 reps Short Arc Quad: AROM;Left;10 reps Heel Slides: AAROM;Left;10 reps Hip ABduction/ADduction: AAROM;Left;10 reps   PT Diagnosis:    PT Problem List:   PT Treatment Interventions:     PT Goals Acute Rehab PT Goals PT Goal Formulation: With patient Time For Goal Achievement: 07/03/12 Potential to Achieve Goals: Good Pt will go Sit to Stand:  with supervision PT Goal: Sit to Stand - Progress: Met Pt will go Stand to Sit: with supervision PT Goal: Stand to Sit - Progress: Met Pt will Ambulate: 51 - 150 feet;with supervision;with rolling walker PT Goal: Ambulate - Progress: Met  Visit Information  Last PT Received On: 06/30/12 Assistance Needed: +1    Subjective Data  Subjective: I'm going home today.  Patient Stated Goal: HOME asap   Cognition  Cognition Overall Cognitive Status: Appears within functional limits for tasks assessed/performed Arousal/Alertness: Awake/alert Orientation Level: Appears intact for tasks assessed Behavior During Session: [facility] for tasks performed    Balance     End of Session PT - End of Session Activity Tolerance: Patient tolerated treatment well Patient left: in chair;with call bell/phone within reach;Other (comment) Nurse Communication: Mobility status   GP     Vista Deck 06/30/2012, 9:49 AM

## 2012-06-30 NOTE — Progress Notes (Signed)
Discharged from floor via w/c, friend with pt. No changes in assessment. Arlo Butt  

## 2012-06-30 NOTE — Progress Notes (Signed)
Subjective: 3 Days Post-Op Procedure(s) (LRB): LEFT TOTAL HIP REVISION (Left) Patient reports pain as mild.  Ambulating with a walker in PT.  C/o stiffness in L hip.  No n/v.  Objective: Vital signs in last 24 hours: Temp:  [97.2 F (36.2 C)-99.4 F (37.4 C)] 98.7 F (37.1 C) (02/23 0530) Pulse Rate:  [61-74] 61 (02/23 0530) Resp:  [14-18] 18 (02/23 0530) BP: (121-163)/(66-84) 162/78 mmHg (02/23 0530) SpO2:  [94 %-100 %] 94 % (02/23 0530)  Intake/Output from previous day: 02/22 0701 - 02/23 0700 In: 1964 [P.O.:720; I.V.:515.7; Blood:728.3] Out: 2250 [Urine:2250] Intake/Output this shift: Total I/O In: 56.3 [I.V.:56.3] Out: 600 [Urine:600]   Recent Labs  06/28/12 0413 06/29/12 0508 06/29/12 1440 06/30/12 0505  HGB 8.7* 7.8* 7.3* 9.4*    Recent Labs  06/29/12 0508 06/29/12 1440 06/30/12 0505  WBC 14.2*  --  8.8  RBC 2.72*  --  3.36*  HCT 23.6* 22.1* 28.3*  PLT 174  --  160    Recent Labs  06/28/12 0413 06/29/12 0508  NA 134* 134*  K 4.0 4.4  CL 103 105  CO2 25 25  BUN 10 9  CREATININE 0.60 0.54  GLUCOSE 162* 110*  CALCIUM 8.0* 8.4   No results found for this basename: LABPT, INR,  in the last 72 hours  L hip wound dressed and dry.  NVI at L LE.  Assessment/Plan: 3 Days Post-Op Procedure(s) (LRB): LEFT TOTAL HIP REVISION (Left) Discharge home with home health  Toni Arthurs 06/30/2012, 9:03 AM

## 2012-06-30 NOTE — Plan of Care (Signed)
Problem: Discharge Progression Outcomes Goal: Anticoagulant follow-up in place Outcome: Not Applicable Date Met:  06/30/12 On xarelto

## 2012-06-30 NOTE — Care Management Note (Signed)
    Page 1 of 1   06/30/2012     3:03:31 PM   CARE MANAGEMENT NOTE 06/30/2012  Patient:  JESIAH, YERBY   Account Number:  [id number]  Date Initiated:  06/30/2012  Documentation initiated by:  [facility]  Subjective/Objective Assessment:   ADMITTED W/HERNIATED NUCLEUS PULPOSUS LUMBAR.     Action/Plan:   FROM HOME.   Anticipated DC Date:  06/30/2012   Anticipated DC Plan:  HOME W HOME HEALTH SERVICES      DC Planning Services  CM consult      Choice offered to / List presented to:  C-1 Patient        HH arranged  HH-2 PT      [facility] [place] agency  [facility]   Status of service:  Completed, signed off Medicare Important Message given?   (If response is "NO", the following Medicare IM given date fields will be blank) Date Medicare IM given:   Date Additional Medicare IM given:    Discharge Disposition:  HOME W HOME HEALTH SERVICES  Per UR Regulation:    If discussed at Long Length of Stay Meetings, dates discussed:    Comments:  06/30/12 Shalayne Leach RN,BSN NCM [phone] [facility] CHOSEN SPOKE TO KRISTINE,TEL#[phone]/FAX#[phone] W/CONFIRMATION OF FACE SHEET,D/C SUMMARY,D/C ORDER,H&P,HHPT ORDER.

## 2012-07-01 ENCOUNTER — Encounter (HOSPITAL_COMMUNITY): Payer: Self-pay | Admitting: Orthopedic Surgery

## 2012-07-11 NOTE — Discharge Summary (Signed)
Physician Discharge Summary   Patient ID: ILETA OFARRELL MRN: [id number] DOB/AGE: 13-May-1931 79 y.o.  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  Admission Diagnoses:  Past Medical History  Diagnosis Date  . Hypothyroidism 3-21- 13    tx. levothyroxine  . Blood transfusion 04-10-2012    post surgery some time ago  . Arthritis 07-27-11    osteoarthritis-hips/s/p bil. THA, arthritis right hand   . Hypertension   . Anemia     hx of with surgery   . Hard of hearing     both ears mild loss  . Complication of anesthesia     epinephrine - screams uncontrollably   . Cancer 07-27-11    Breast cancer -s/p lt. mastectomy, some squamous cell lesions   Discharge Diagnoses:   Principal Problem:   Failed total hip arthroplasty Active Problems:   Postop Hyponatremia   Postop Transfusion   Postoperative anemia due to acute blood loss  Estimated body mass index is 20.3 kg/(m^2) as calculated from the following:   Height as of this encounter: 5\' 5"  (1.651 m).   Weight as of this encounter: 55.339 kg (122 lb).  Procedure(s) (LRB): LEFT TOTAL HIP REVISION (Left)   Consults: None  HPI: [name] is a 79 year old female who had a left total  hip arthroplasty done over 10 years ago. She had a revision on the  right side due to a nonunion of a periprosthetic fracture. She thus was  placed in much more stress on the left hip for quite a while. She has  gone on to develop severe left hip pain. Bone scan was consistent with  probable loosening of the femoral stem. Plain film was equivocal for  this. Given her significant pain and dysfunction and positive bone  scan, it was decided that we proceed with a revision of at least the  femoral component. She presents now for surgical revision.  Laboratory Data: Admission on 06/27/2012, Discharged on 06/30/2012  Component Date Value Range Status  . ABO/RH(D)  06/27/2012 O POS   Final  . Antibody Screen 06/27/2012 NEG   Final  . Sample Expiration 06/27/2012 06/30/2012   Final  . Unit Number 06/27/2012 [id number]   Final  . Blood Component Type 06/27/2012 RED CELLS,LR   Final  . Unit division 06/27/2012 00   Final  . Status of Unit 06/27/2012 ISSUED,FINAL   Final  . Transfusion Status 06/27/2012 OK TO TRANSFUSE   Final  . Crossmatch Result 06/27/2012 Compatible   Final  . Unit Number 06/27/2012 [id number]   Final  . Blood Component Type 06/27/2012 RED CELLS,LR   Final  . Unit division 06/27/2012 00   Final  . Status of Unit 06/27/2012 ISSUED,FINAL   Final  . Transfusion Status 06/27/2012 OK TO TRANSFUSE   Final  . Crossmatch Result 06/27/2012 Compatible   Final  . Order Confirmation 06/27/2012 ORDER PROCESSED BY BLOOD BANK   Final  . WBC 06/28/2012 10.1  4.0 - 10.5 K/uL Final  . RBC 06/28/2012 2.94* 3.87 - 5.11 MIL/uL Final  . Hemoglobin 06/28/2012 8.7* 12.0 - 15.0 g/dL Final  . HCT 96/29/5284 25.6* 36.0 - 46.0 % Final  . MCV 06/28/2012 87.1  78.0 - 100.0 fL Final  . MCH 06/28/2012 29.6  26.0 - 34.0 pg Final  . MCHC 06/28/2012 34.0  30.0 - 36.0 g/dL Final  . RDW 13/24/4010 13.8  11.5 - 15.5 % Final  .  Platelets 06/28/2012 176  150 - 400 K/uL Final  . Sodium 06/28/2012 134* 135 - 145 mEq/L Final  . Potassium 06/28/2012 4.0  3.5 - 5.1 mEq/L Final  . Chloride 06/28/2012 103  96 - 112 mEq/L Final  . CO2 06/28/2012 25  19 - 32 mEq/L Final  . Glucose, Bld 06/28/2012 162* 70 - 99 mg/dL Final  . BUN 16/02/9603 10  6 - 23 mg/dL Final  . Creatinine, Ser 06/28/2012 0.60  0.50 - 1.10 mg/dL Final  . Calcium 54/01/8118 8.0* 8.4 - 10.5 mg/dL Final  . GFR calc non Af Amer 06/28/2012 85* >90 mL/min Final  . GFR calc Af Amer 06/28/2012 >90  >90 mL/min Final   Comment:                                 The eGFR has been calculated                          using the CKD EPI equation.                          This calculation has not been                           validated in all clinical                          situations.                          eGFR's persistently                          <90 mL/min signify                          possible Chronic Kidney Disease.  . WBC 06/29/2012 14.2* 4.0 - 10.5 K/uL Final  . RBC 06/29/2012 2.72* 3.87 - 5.11 MIL/uL Final  . Hemoglobin 06/29/2012 7.8* 12.0 - 15.0 g/dL Final  . HCT 14/78/2956 23.6* 36.0 - 46.0 % Final  . MCV 06/29/2012 86.8  78.0 - 100.0 fL Final  . MCH 06/29/2012 28.7  26.0 - 34.0 pg Final  . MCHC 06/29/2012 33.1  30.0 - 36.0 g/dL Final  . RDW 21/30/8657 13.9  11.5 - 15.5 % Final  . Platelets 06/29/2012 174  150 - 400 K/uL Final  . Sodium 06/29/2012 134* 135 - 145 mEq/L Final  . Potassium 06/29/2012 4.4  3.5 - 5.1 mEq/L Final  . Chloride 06/29/2012 105  96 - 112 mEq/L Final  . CO2 06/29/2012 25  19 - 32 mEq/L Final  . Glucose, Bld 06/29/2012 110* 70 - 99 mg/dL Final  . BUN 84/69/6295 9  6 - 23 mg/dL Final  . Creatinine, Ser 06/29/2012 0.54  0.50 - 1.10 mg/dL Final  . Calcium 28/41/3244 8.4  8.4 - 10.5 mg/dL Final  . GFR calc non Af Amer 06/29/2012 88* >90 mL/min Final  . GFR calc Af Amer 06/29/2012 >90  >90 mL/min Final   Comment:  The eGFR has been calculated                          using the CKD EPI equation.                          This calculation has not been                          validated in all clinical                          situations.                          eGFR's persistently                          <90 mL/min signify                          possible Chronic Kidney Disease.  Marland Kitchen Hemoglobin 06/29/2012 7.3* 12.0 - 15.0 g/dL Final  . HCT 41/32/4401 22.1* 36.0 - 46.0 % Final  . WBC 06/30/2012 8.8  4.0 - 10.5 K/uL Final  . RBC 06/30/2012 3.36* 3.87 - 5.11 MIL/uL Final  . Hemoglobin 06/30/2012 9.4* 12.0 - 15.0 g/dL Final   Comment: POST TRANSFUSION SPECIMEN                          DELTA CHECK NOTED  . HCT 06/30/2012  28.3* 36.0 - 46.0 % Final  . MCV 06/30/2012 84.2  78.0 - 100.0 fL Final  . MCH 06/30/2012 28.0  26.0 - 34.0 pg Final  . MCHC 06/30/2012 33.2  30.0 - 36.0 g/dL Final  . RDW 02/72/5366 16.2* 11.5 - 15.5 % Final  . Platelets 06/30/2012 160  150 - 400 K/uL Final  Hospital Outpatient Visit on 06/18/2012  Component Date Value Range Status  . aPTT 06/18/2012 30  24 - 37 seconds Final  . WBC 06/18/2012 7.3  4.0 - 10.5 K/uL Final  . RBC 06/18/2012 4.47  3.87 - 5.11 MIL/uL Final  . Hemoglobin 06/18/2012 12.6  12.0 - 15.0 g/dL Final  . HCT 44/07/4740 39.4  36.0 - 46.0 % Final  . MCV 06/18/2012 88.1  78.0 - 100.0 fL Final  . MCH 06/18/2012 28.2  26.0 - 34.0 pg Final  . MCHC 06/18/2012 32.0  30.0 - 36.0 g/dL Final  . RDW 59/56/3875 13.7  11.5 - 15.5 % Final  . Platelets 06/18/2012 250  150 - 400 K/uL Final  . Sodium 06/18/2012 133* 135 - 145 mEq/L Final  . Potassium 06/18/2012 4.5  3.5 - 5.1 mEq/L Final  . Chloride 06/18/2012 96  96 - 112 mEq/L Final  . CO2 06/18/2012 31  19 - 32 mEq/L Final  . Glucose, Bld 06/18/2012 83  70 - 99 mg/dL Final  . BUN 64/33/2951 12  6 - 23 mg/dL Final  . Creatinine, Ser 06/18/2012 0.58  0.50 - 1.10 mg/dL Final  . Calcium 88/41/6606 9.3  8.4 - 10.5 mg/dL Final  . Total Protein 06/18/2012 7.5  6.0 - 8.3 g/dL Final  . Albumin 30/16/0109 4.0  3.5 - 5.2 g/dL Final  . AST 32/35/5732 12  0 - 37 U/L Final  .  ALT 06/18/2012 12  0 - 35 U/L Final  . Alkaline Phosphatase 06/18/2012 78  39 - 117 U/L Final  . Total Bilirubin 06/18/2012 0.3  0.3 - 1.2 mg/dL Final  . GFR calc non Af Amer 06/18/2012 86* >90 mL/min Final  . GFR calc Af Amer 06/18/2012 >90  >90 mL/min Final   Comment:                                 The eGFR has been calculated                          using the CKD EPI equation.                          This calculation has not been                          validated in all clinical                          situations.                          eGFR's  persistently                          <90 mL/min signify                          possible Chronic Kidney Disease.  Marland Kitchen Prothrombin Time 06/18/2012 12.9  11.6 - 15.2 seconds Final  . INR 06/18/2012 0.98  0.00 - 1.49 Final  . Color, Urine 06/18/2012 YELLOW  YELLOW Final  . APPearance 06/18/2012 CLEAR  CLEAR Final  . Specific Gravity, Urine 06/18/2012 1.010  1.005 - 1.030 Final  . pH 06/18/2012 7.5  5.0 - 8.0 Final  . Glucose, UA 06/18/2012 NEGATIVE  NEGATIVE mg/dL Final  . Hgb urine dipstick 06/18/2012 NEGATIVE  NEGATIVE Final  . Bilirubin Urine 06/18/2012 NEGATIVE  NEGATIVE Final  . Ketones, ur 06/18/2012 NEGATIVE  NEGATIVE mg/dL Final  . Protein, ur 16/02/9603 NEGATIVE  NEGATIVE mg/dL Final  . Urobilinogen, UA 06/18/2012 0.2  0.0 - 1.0 mg/dL Final  . Nitrite 54/01/8118 NEGATIVE  NEGATIVE Final  . Leukocytes, UA 06/18/2012 NEGATIVE  NEGATIVE Final   MICROSCOPIC NOT DONE ON URINES WITH NEGATIVE PROTEIN, BLOOD, LEUKOCYTES, NITRITE, OR GLUCOSE <1000 mg/dL.  Marland Kitchen MRSA, PCR 06/18/2012 NEGATIVE  NEGATIVE Final  . Staphylococcus aureus 06/18/2012 NEGATIVE  NEGATIVE Final   Comment:                                 The Xpert SA Assay (FDA                          approved for NASAL specimens                          in patients over 15 years of age),                          is one component  of                          a comprehensive surveillance                          program.  Test performance has                          been validated by [facility] for patients greater                          than or equal to 47 year old.                          It is not intended                          to diagnose infection nor to                          guide or monitor treatment.     X-Rays:Dg Hip Complete Left  06/27/2012  *RADIOLOGY REPORT*  Clinical Data: Left total hip revision  LEFT HIP - COMPLETE 2+ VIEW  Comparison: Multiple priors  Findings: The patient has  undergone revision of left total hip with a long stem inserted across an area of proximal fracture.  Cerclage banding has been performed above the fracture. The the.  The alignment is anatomic.  There is no dislocation.  Air is noted in the soft tissues.  IMPRESSION: As above.   Original Report Authenticated By: Davonna Belling, M.D.    Dg Hip Complete Left  06/18/2012  *RADIOLOGY REPORT*  Clinical Data: Patient for left hip replacement.  LEFT HIP - COMPLETE 2+ VIEW  Comparison: None.  Findings: The patient has bilateral total hip replacements.  Both devices are located.  There is no fracture.  Cerclage wires about the right femoral component are noted.  IMPRESSION: Bilateral total hip replacements.  No acute abnormality.   Original Report Authenticated By: Holley Dexter, M.D.    Dg Pelvis Portable  06/27/2012  *RADIOLOGY REPORT*  Clinical Data: Postop left total hip revision  PORTABLE PELVIS,PORTABLE LEFT HIP - 1 VIEW  Comparison: Multiple priors.  Findings: In removing the previous left femoral component there appears to have developed a slight subtrochanteric lucency. Cerclage wires were placed around the area of irregularity along with placement of a distal intramedullary stem.  The acetabular component remains satisfactory.  There is no dislocation.  IMPRESSION: As above.   Original Report Authenticated By: Davonna Belling, M.D.    Dg Hip Portable 1 View Left  06/27/2012  *RADIOLOGY REPORT*  Clinical Data: Postop left total hip revision  PORTABLE PELVIS,PORTABLE LEFT HIP - 1 VIEW  Comparison: Multiple priors.  Findings: In removing the previous left femoral component there appears to have developed a slight subtrochanteric lucency. Cerclage wires were placed around the area of irregularity along with placement of a distal intramedullary stem.  The acetabular component remains satisfactory.  There is no dislocation.  IMPRESSION: As above.   Original Report Authenticated By: Davonna Belling, M.D.      EKG:  Orders placed during the hospital encounter of 07/31/11  . EKG     Hospital Course: Patient was admitted to [facility] and taken to the OR and underwent the above state procedure without complications.  Patient tolerated the procedure well and was later transferred to the recovery room and then to the orthopaedic floor for postoperative care.  They were given PO and IV analgesics for pain control following their surgery.  They were given 24 hours of postoperative antibiotics of  Anti-infectives   Start     Dose/Rate Route Frequency Ordered Stop   06/27/12 2100  ceFAZolin (ANCEF) IVPB 1 g/50 mL premix     1 g 100 mL/hr over 30 Minutes Intravenous Every 6 hours 06/27/12 1935 06/28/12 0335   06/27/12 1130  ceFAZolin (ANCEF) IVPB 2 g/50 mL premix     2 g 100 mL/hr over 30 Minutes Intravenous 30 min pre-op 06/27/12 1130 06/27/12 1500     and started on DVT prophylaxis in the form of Xarelto.   PT and OT were ordered for total hip protocol.  The patient was allowed to be WBAT with therapy. Discharge planning was consulted to help with postop disposition and equipment needs.  Patient had a tough night on the evening of surgery. The patient had issues of hypotension and bradycardia postop on the evening of surgery. She responded to fluid resuscitation. Monitored pressures. They started to get up OOB with therapy on day one and walked over 40 feet later that day.  Hemovac drain was pulled without difficulty.  The knee immobilizer was removed and discontinued.  Continued to work with therapy into day two.  Dressing was changed on day two and the incision was healing well but the HGB was found to be low at 7.8.  The patient received blood that day. By day three, the patient had progressed with therapy and meeting their goals. Feeling better after the blood. Incision was healing well.  Patient was seen in rounds and was ready to go home.   Discharge Medications: Prior to Admission  medications   Medication Sig Start Date End Date Taking? Authorizing Provider  amLODipine (NORVASC) 5 MG tablet Take 5 mg by mouth daily before breakfast.    Yes Historical Provider, MD  levothyroxine (SYNTHROID, LEVOTHROID) 125 MCG tablet Take 125 mcg by mouth daily before breakfast.   Yes Historical Provider, MD  methocarbamol (ROBAXIN) 500 MG tablet Take 1 tablet (500 mg total) by mouth every 6 (six) hours as needed. 06/28/12   Alexzandrew Julien Girt, PA-C  Multiple Vitamin (MULTIVITAMIN WITH MINERALS) TABS Take 1 tablet by mouth daily.    Historical Provider, MD  oxyCODONE (OXY IR/ROXICODONE) 5 MG immediate release tablet Take 1-2 tablets (5-10 mg total) by mouth every 3 (three) hours as needed. 06/28/12   Alexzandrew Julien Girt, PA-C  rivaroxaban (XARELTO) 10 MG TABS tablet Take 1 tablet (10 mg total) by mouth daily with breakfast. Take Xarelto for two and a half more weeks, then discontinue Xarelto. 06/28/12   Alexzandrew Julien Girt, PA-C  traMADol (ULTRAM) 50 MG tablet Take 1-2 tablets (50-100 mg total) by mouth every 6 (six) hours as needed (mild pain). 06/28/12   Alexzandrew Julien Girt, PA-C    Diet: Cardiac diet Activity:WBAT No bending hip over 90 degrees- A "L" Angle Do not cross legs Do not let foot roll inward When turning these patients a pillow should be placed between the patient's legs to prevent crossing. Patients should have the affected knee fully extended when trying to sit  or stand from all surfaces to prevent excessive hip flexion. When ambulating and turning toward the affected side the affected leg should have the toes turned out prior to moving the walker and the rest of patient's body as to prevent internal rotation/ turning in of the leg. Abduction pillows are the most effective way to prevent a patient from not crossing legs or turning toes in at rest. If an abduction pillow is not ordered placing a regular pillow length wise between the patient's legs is also an effective  reminder. It is imperative that these precautions be maintained so that the surgical hip does not dislocate. Follow-up:in 2 weeks Disposition - Home Discharged Condition: good   Discharge Orders   Future Orders Complete By Expires     Call MD / Call 911  As directed     Comments:      If you experience chest pain or shortness of breath, CALL 911 and be transported to the hospital emergency room.  If you develope a fever above 101 F, pus (white drainage) or increased drainage or redness at the wound, or calf pain, call your surgeon's office.    Call MD / Call 911  As directed     Comments:      If you experience chest pain or shortness of breath, CALL 911 and be transported to the hospital emergency room.  If you develope a fever above 101 F, pus (white drainage) or increased drainage or redness at the wound, or calf pain, call your surgeon's office.    Change dressing  As directed     Comments:      You may change your dressing dressing daily with sterile 4 x 4 inch gauze dressing and paper tape.  Do not submerge the incision under water.    Change dressing  As directed     Comments:      You may change your dressing daily with sterile 4 x 4 inch gauze dressing and paper tape.  You may clean the incision with alcohol prior to redressing    Constipation Prevention  As directed     Comments:      Drink plenty of fluids.  Prune juice may be helpful.  You may use a stool softener, such as Colace (over the counter) 100 mg twice a day.  Use MiraLax (over the counter) for constipation as needed.    Constipation Prevention  As directed     Comments:      Drink plenty of fluids.  Prune juice may be helpful.  You may use a stool softener, such as Colace (over the counter) 100 mg twice a day.  Use MiraLax (over the counter) for constipation as needed.    Diet - low sodium heart healthy  As directed     Diet - low sodium heart healthy  As directed     Discharge instructions  As directed      Comments:      Pick up stool softner and laxative for home. Do not submerge incision under water. May shower. Continue to use ice for pain and swelling from surgery. Hip precautions.  Total Hip Protocol.  Take Xarelto for two and a half more weeks, then discontinue Xarelto.    Do not sit on low chairs, stoools or toilet seats, as it may be difficult to get up from low surfaces  As directed     Driving restrictions  As directed     Comments:  No driving until released by the physician.    Follow the hip precautions as taught in Physical Therapy  As directed     Follow the hip precautions as taught in Physical Therapy  As directed     Increase activity slowly as tolerated  As directed     Increase activity slowly as tolerated  As directed     Lifting restrictions  As directed     Comments:      No lifting until released by the physician.    Patient may shower  As directed     Comments:      You may shower without a dressing once there is no drainage.  Do not wash over the wound.  If drainage remains, do not shower until drainage stops.    TED hose  As directed     Comments:      Use stockings (TED hose) for 3 weeks on both leg(s).  You may remove them at night for sleeping.    Weight bearing as tolerated  As directed         Medication List    STOP taking these medications       calcium-vitamin D 500-200 MG-UNIT per tablet  Commonly known as:  OSCAL WITH D     cholecalciferol 1000 UNITS tablet  Commonly known as:  VITAMIN D     fish oil-omega-3 fatty acids 1000 MG capsule     naproxen sodium 220 MG tablet  Commonly known as:  ANAPROX      TAKE these medications       amLODipine 5 MG tablet  Commonly known as:  NORVASC  Take 5 mg by mouth daily before breakfast.     levothyroxine 125 MCG tablet  Commonly known as:  SYNTHROID, LEVOTHROID  Take 125 mcg by mouth daily before breakfast.     methocarbamol 500 MG tablet  Commonly known as:  ROBAXIN  Take 1 tablet  (500 mg total) by mouth every 6 (six) hours as needed.     multivitamin with minerals Tabs  Take 1 tablet by mouth daily.     oxyCODONE 5 MG immediate release tablet  Commonly known as:  Oxy IR/ROXICODONE  Take 1-2 tablets (5-10 mg total) by mouth every 3 (three) hours as needed.     rivaroxaban 10 MG Tabs tablet  Commonly known as:  XARELTO  Take 1 tablet (10 mg total) by mouth daily with breakfast. Take Xarelto for two and a half more weeks, then discontinue Xarelto.     traMADol 50 MG tablet  Commonly known as:  ULTRAM  Take 1-2 tablets (50-100 mg total) by mouth every 6 (six) hours as needed (mild pain).           Follow-up Information   Follow up with Loanne Drilling, MD. Schedule an appointment as soon as possible for a visit in 2 weeks.   Contact information:   [address], [address] [place] [address] [phone]       Signed: Patrica Petty 07/11/2012, 9:45 AM

## 2013-05-14 ENCOUNTER — Encounter: Payer: Self-pay | Admitting: Internal Medicine

## 2013-05-15 ENCOUNTER — Encounter: Payer: Self-pay | Admitting: Internal Medicine

## 2013-06-01 IMAGING — CR DG PORTABLE PELVIS
1 series · 1 of 1 positions shown · non-contrast
Comparison: Multiple priors.

CLINICAL DATA: Postop left total hip revision

PORTABLE PELVIS,PORTABLE LEFT HIP - 1 VIEW

[AP]
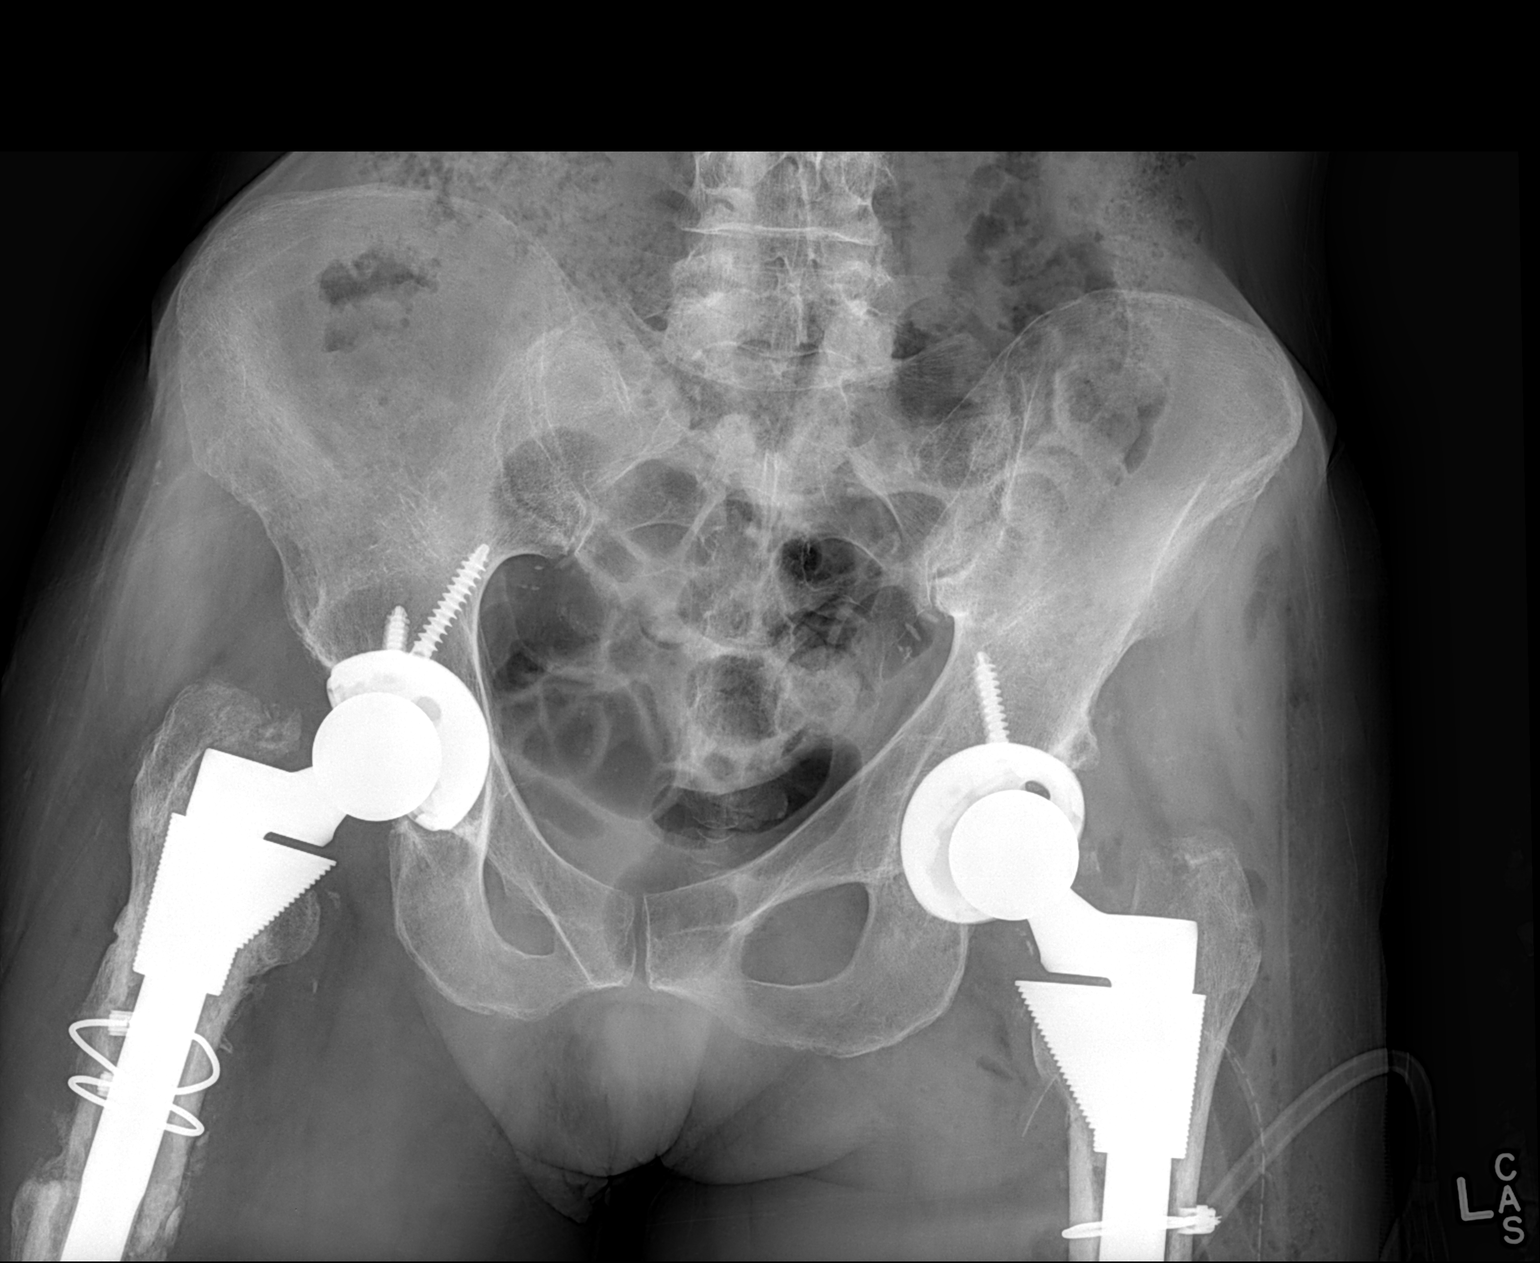

[1 of 1 positions shown; findings below may reference images not displayed]

FINDINGS: In removing the previous left femoral component there
appears to have developed a slight subtrochanteric lucency.
Cerclage wires were placed around the area of irregularity along
with placement of a distal intramedullary stem.  The acetabular
component remains satisfactory.  There is no dislocation.
IMPRESSION: As above.

## 2013-06-01 IMAGING — CR DG HIP (WITH OR WITHOUT PELVIS) 2-3V*L*
1 series · 3 of 3 positions shown · non-contrast
Comparison: Multiple priors

CLINICAL DATA: Left total hip revision

LEFT HIP - COMPLETE 2+ VIEW

[Series 1: AP · left · 3 of 3 slices shown]
[im 1/3]
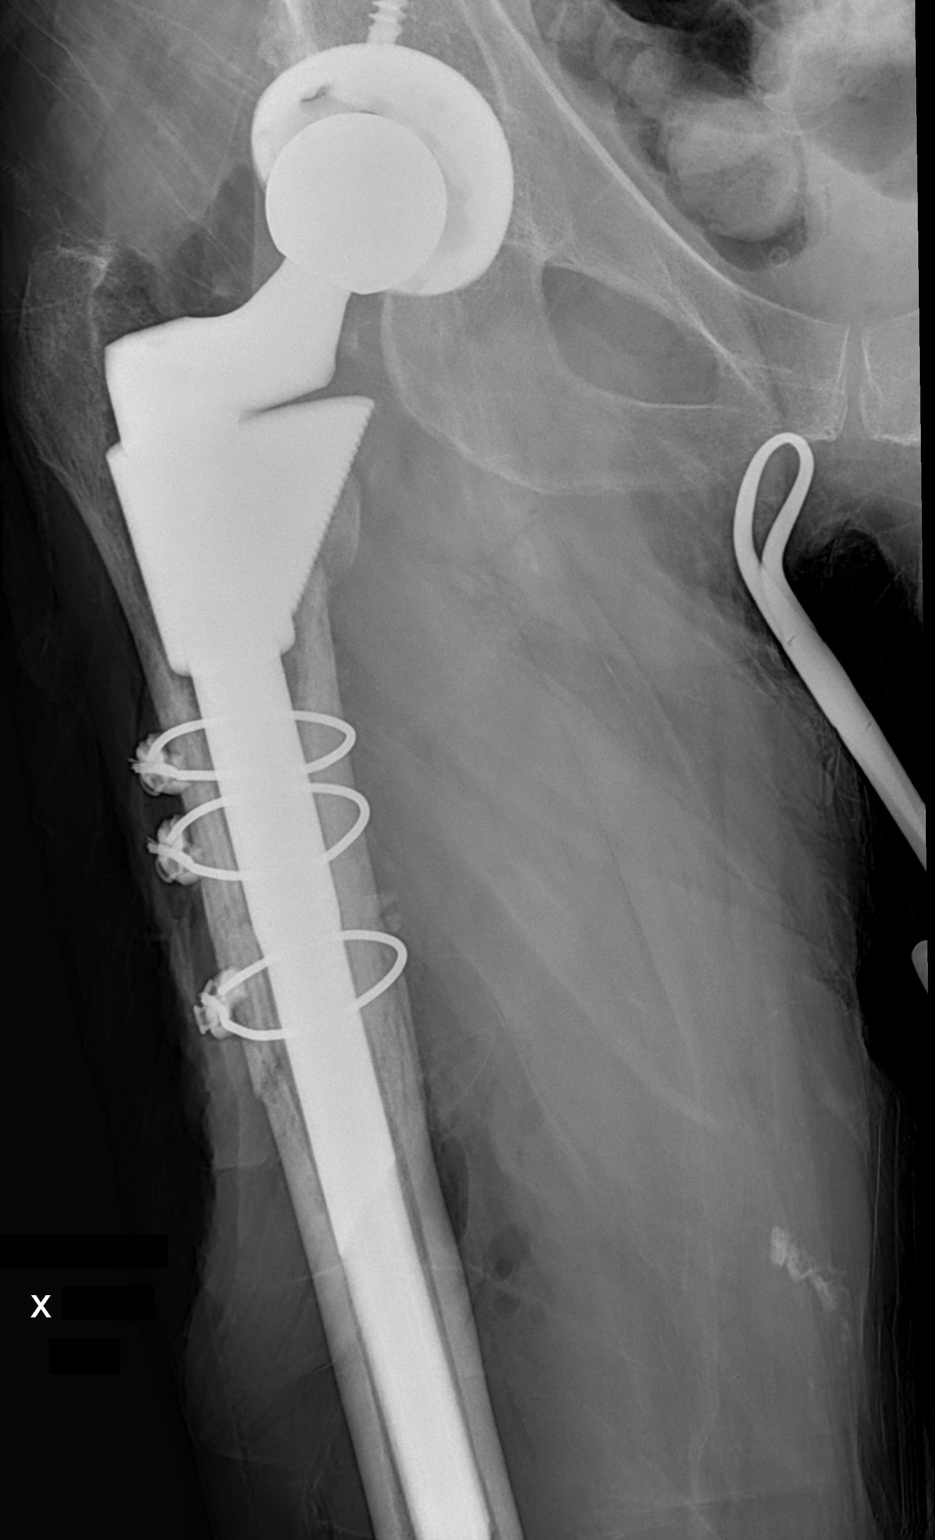
[im 2/3]
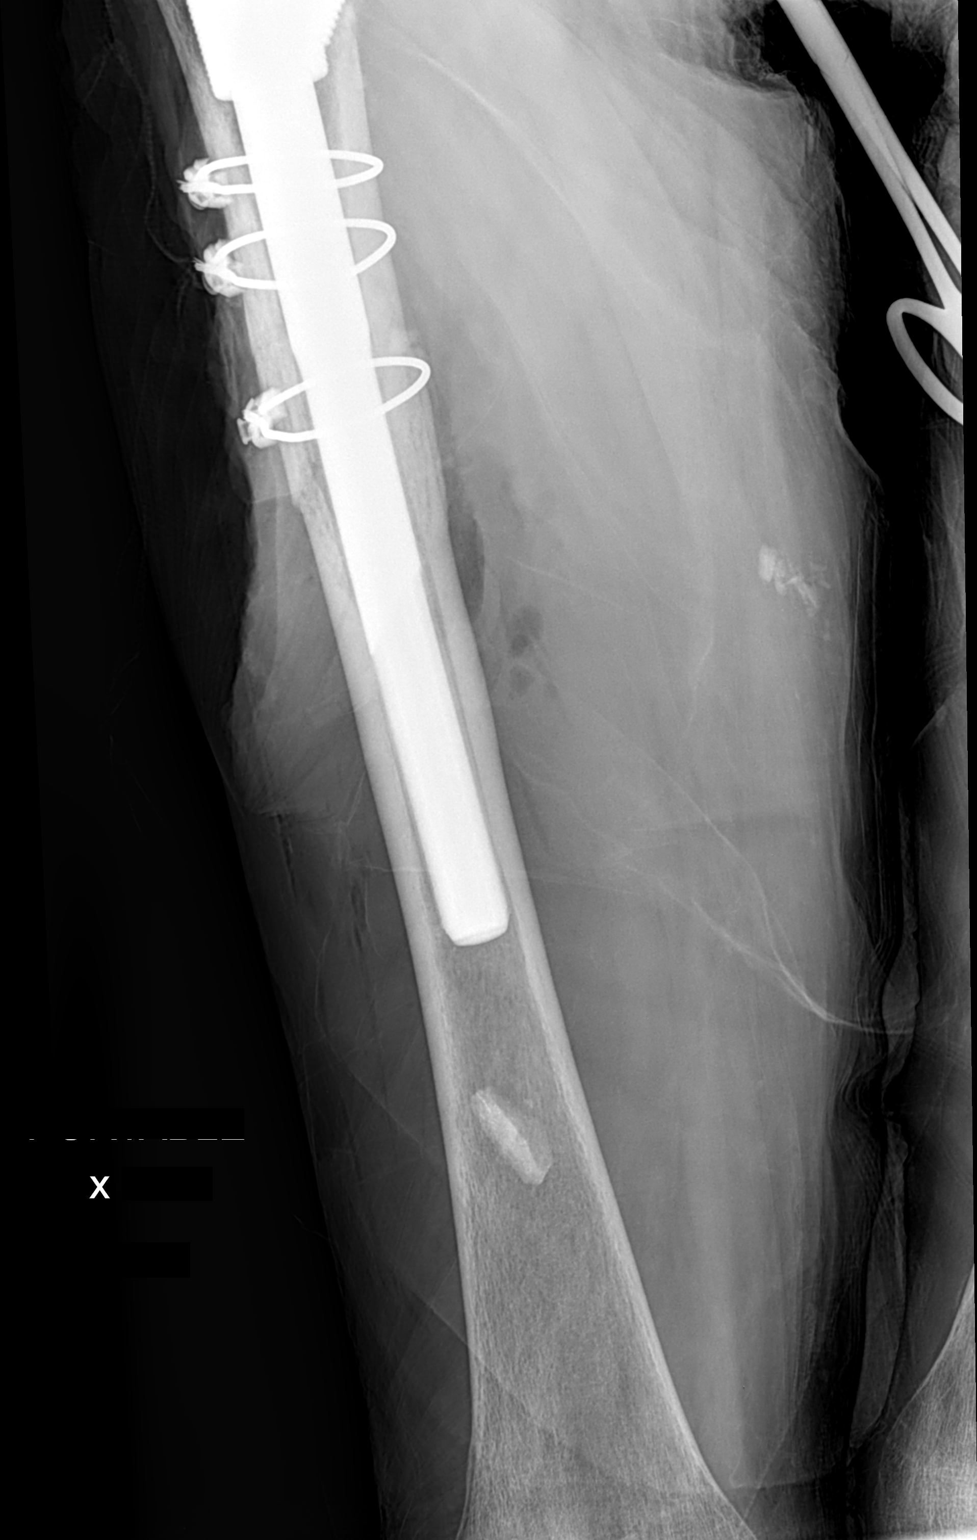
[im 3/3]
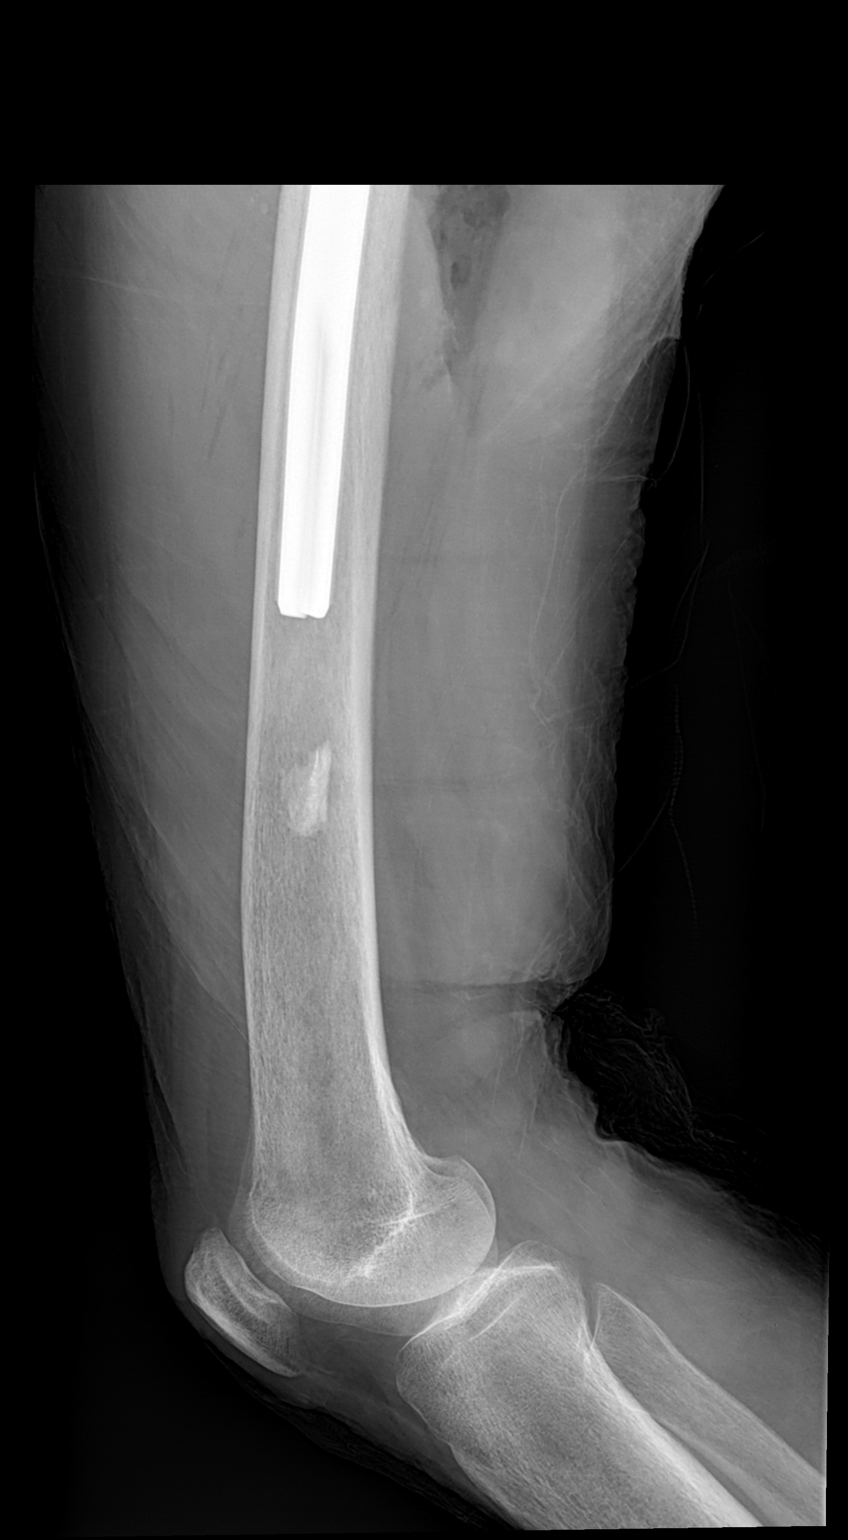

[3 of 3 positions shown; findings below may reference images not displayed]

FINDINGS: The patient has undergone revision of left total hip with
a long stem inserted across an area of proximal fracture.  Cerclage
banding has been performed above the fracture. The the.  The
alignment is anatomic.  There is no dislocation.  Air is noted in
the soft tissues.
IMPRESSION: As above.

## 2013-06-12 ENCOUNTER — Other Ambulatory Visit: Payer: Self-pay | Admitting: Orthopedic Surgery

## 2013-06-20 ENCOUNTER — Other Ambulatory Visit (HOSPITAL_COMMUNITY): Payer: Self-pay | Admitting: *Deleted

## 2013-06-20 ENCOUNTER — Encounter (HOSPITAL_COMMUNITY): Payer: Self-pay | Admitting: Pharmacy Technician

## 2013-06-20 NOTE — Patient Instructions (Addendum)
[name] [facility]  06/20/2013                           YOUR PROCEDURE IS SCHEDULED ON: 06/30/13               PLEASE REPORT TO SHORT STAY CENTER AT : 1:30 PM               CALL THIS NUMBER IF ANY PROBLEMS THE DAY OF SURGERY :               [phone]                                REMEMBER:   Do not eat food  AFTER MIDNIGHT   May have clear liquids UNTIL 6 HOURS BEFORE SURGERY (10:30 AM)     Take these medicines the morning of surgery with A SIP OF WATER:  AMLODIPINE / SYNTHROID   Do not wear jewelry, make-up   Do not wear lotions, powders, or perfumes.   Do not shave legs or underarms 12 hrs. before surgery (men may shave face)  Do not bring valuables to the hospital.  Contacts, dentures or bridgework may not be worn into surgery.  Leave suitcase in the car. After surgery it may be brought to your room.  For patients admitted to the hospital more than one night, checkout time is 11:00 AM                                                       The day of discharge.   Patients discharged the day of surgery will not be allowed to drive home.              If going home same day of surgery, must have someone stay with you first              24 hrs at home and arrange for some one to drive you home from hospital.    Special Instructions:   Please read over the following fact sheets that you were given:               1. [place]                                                X_____________________________________________________________________        Failure to follow these instructions may result in cancellation of your surgery

## 2013-06-24 ENCOUNTER — Encounter (HOSPITAL_COMMUNITY): Payer: Self-pay

## 2013-06-24 ENCOUNTER — Ambulatory Visit (HOSPITAL_COMMUNITY)
Admission: RE | Admit: 2013-06-24 | Discharge: 2013-06-24 | Disposition: A | Discharge status: Home / Self Care | Payer: Medicare Other | Source: Ambulatory Visit | Attending: Orthopedic Surgery | Admitting: Orthopedic Surgery

## 2013-06-24 ENCOUNTER — Encounter (HOSPITAL_COMMUNITY)
Admission: RE | Admit: 2013-06-24 | Discharge: 2013-06-24 | Disposition: A | Discharge status: Home / Self Care | Payer: Medicare Other | Source: Ambulatory Visit | Attending: Orthopedic Surgery | Admitting: Orthopedic Surgery

## 2013-06-24 DIAGNOSIS — Z01812 Encounter for preprocedural laboratory examination: Secondary | ICD-10-CM | POA: Insufficient documentation

## 2013-06-24 DIAGNOSIS — I10 Essential (primary) hypertension: Secondary | ICD-10-CM | POA: Insufficient documentation

## 2013-06-24 DIAGNOSIS — Z01810 Encounter for preprocedural cardiovascular examination: Secondary | ICD-10-CM | POA: Insufficient documentation

## 2013-06-24 DIAGNOSIS — Z01818 Encounter for other preprocedural examination: Secondary | ICD-10-CM | POA: Insufficient documentation

## 2013-06-24 HISTORY — DX: Other specified disorders of bone density and structure, unspecified site: M85.80

## 2013-06-24 HISTORY — DX: Unspecified disturbances of skin sensation: R20.9

## 2013-06-24 HISTORY — DX: Constipation, unspecified: K59.00

## 2013-06-24 LAB — BASIC METABOLIC PANEL
BUN: 16 mg/dL (ref 6–23)
CHLORIDE: 99 meq/L (ref 96–112)
CO2: 28 mEq/L (ref 19–32)
CREATININE: 0.6 mg/dL (ref 0.50–1.10)
Calcium: 9.7 mg/dL (ref 8.4–10.5)
GFR calc non Af Amer: 84 mL/min — ABNORMAL LOW (ref >90)
Glucose, Bld: 77 mg/dL (ref 70–99)
Potassium: 4.9 mEq/L (ref 3.7–5.3)
SODIUM: 137 meq/L (ref 137–147)

## 2013-06-24 LAB — CBC
HEMATOCRIT: 39.4 % (ref 36.0–46.0)
Hemoglobin: 13 g/dL (ref 12.0–15.0)
MCH: 29.3 pg (ref 26.0–34.0)
MCHC: 33 g/dL (ref 30.0–36.0)
MCV: 88.7 fL (ref 78.0–100.0)
Platelets: 224 10*3/uL (ref 150–400)
RBC: 4.44 MIL/uL (ref 3.87–5.11)
RDW: 13.6 % (ref 11.5–15.5)
WBC: 5 10*3/uL (ref 4.0–10.5)

## 2013-06-29 NOTE — H&P (Signed)
CC- [name] is a 80 y.o. female who presents with left hip pain  Hip Pain: Patient complains of left hip pain. Onset of the symptoms was several months ago. Inciting event: revision hip surgery requiring cerclage cables to secure a femoral osteotomy. She has pain related to these cables and presents now for hardware removal.   Past Medical History  Diagnosis Date  . Hypothyroidism 3-21- 13    tx. levothyroxine  . Blood transfusion 04-10-2012    post surgery some time ago  . Arthritis 07-27-11    osteoarthritis-hips/s/p bil. THA, arthritis right hand   . Hypertension   . Anemia     hx of with surgery   . Hard of hearing     both ears mild loss  . Cancer 07-27-11    Breast cancer -s/p lt. mastectomy, some squamous cell lesions  . Complication of anesthesia     epinephrine - screams uncontrollably / pt does not want general anesthesia or narcotics  . Cold hands   . Osteopenia   . Constipation     Past Surgical History  Procedure Laterality Date  . Abdominal hysterectomy    . Ankle surgery  2005    right ankle tendon repair  . Cataract extraction, bilateral  few yrs ago    Bilateral  . Squamous cell carcinoma excision  07-27-11    x2 left shoulder  . Orif femur fracture  07/31/2011    Procedure: OPEN REDUCTION INTERNAL FIXATION (ORIF) DISTAL FEMUR FRACTURE;  Surgeon: Gearlean Alf, MD;  Location: [facility];  Service: Orthopedics;  Laterality: Right;  right femur  (c-arm)   . Hardware removal  04/05/2012    Procedure: HARDWARE REMOVAL;  Surgeon: Gearlean Alf, MD;  Location: [facility];  Service: Orthopedics;  Laterality: Right;  Removal of Hardware Right Femur   . Total hip revision  04/10/2012    Procedure: TOTAL HIP REVISION;  Surgeon: Gearlean Alf, MD;  Location: [facility];  Service: Orthopedics;  Laterality: Right;  . Breast surgery  07-1987    lt.breast cancer intraductal cancer, mastectomy  . Joint replacement  07-27-11    Bilateral: Right THA - 04/2000, Left THA 06/2001   . Left hip replacement Left feb 2003  . Total hip revision Left 06/27/2012    Procedure: LEFT TOTAL HIP REVISION;  Surgeon: Gearlean Alf, MD;  Location: [facility];  Service: Orthopedics;  Laterality: Left;  Marland Kitchen Mastectomy  1989    left - Pt states has no restrictions on use of L arm for BP or blood draw    Prior to Admission medications   Medication Sig Start Date End Date Taking? Authorizing Provider  amLODipine (NORVASC) 5 MG tablet Take 5 mg by mouth daily before breakfast.     Historical Provider, MD  aspirin EC 81 MG tablet Take 81 mg by mouth at bedtime.    Historical Provider, MD  B Complex-C (B-COMPLEX WITH VITAMIN C) tablet Take 1 tablet by mouth every other day.    Historical Provider, MD  CALCIUM-VITAMIN D PO Take 1 tablet by mouth daily.    Historical Provider, MD  Hyaluronic Acid-Vitamin C (HYALURONIC ACID PO) Take 1 tablet by mouth daily.    Historical Provider, MD  levothyroxine (SYNTHROID, LEVOTHROID) 125 MCG tablet Take 125 mcg by mouth daily before breakfast.    Historical Provider, MD  methylcellulose (ARTIFICIAL TEARS) 1 % ophthalmic solution Place 1 drop into both eyes 2 (two) times daily as needed (dry eyes).  Historical Provider, MD  Multiple Vitamins-Iron (MULTIVITAMINS WITH IRON) TABS tablet Take 1 tablet by mouth daily.    Historical Provider, MD  naproxen sodium (ANAPROX) 220 MG tablet Take 220 mg by mouth 2 (two) times daily as needed (pain).    Historical Provider, MD  Omega-3 Fatty Acids (FISH OIL) 1200 MG CAPS Take 1,200 mg by mouth daily.    Historical Provider, MD  OVER THE COUNTER MEDICATION Take 1 tablet by mouth every other day. sunflower levithin supplement    Historical Provider, MD    Physical Examination: General appearance - alert, well appearing, and in no distress Mental status - alert, oriented to person, place, and time Chest - clear to auscultation, no wheezes, rales or rhonchi, symmetric air entry Heart - normal rate, regular rhythm, normal S1,  S2, no murmurs, rubs, clicks or gallops Abdomen - soft, nontender, nondistended, no masses or organomegaly Neurological - alert, oriented, normal speech, no focal findings or movement disorder noted  A left hip exam was performed. SKIN: intact SWELLING: none WARMTH: no warmth TENDERNESS: over lateral hip ROM: normal STRENGTH: slight abductor weakness on left GAIT: antalgic  ASSESSMENT:Painful hardware left hip  Plan Hardware removal left hip. Discussed procedure, risks, potential complications and rehab course with patient who elects to proceed  Dione Plover. Tupac Jeffus, MD    06/29/2013, 9:08 PM

## 2013-06-30 ENCOUNTER — Encounter (HOSPITAL_COMMUNITY)
Admission: RE | Disposition: A | Discharge status: Home / Self Care | Payer: Self-pay | Source: Ambulatory Visit | Attending: Orthopedic Surgery

## 2013-06-30 ENCOUNTER — Encounter (HOSPITAL_COMMUNITY): Payer: Medicare Other | Admitting: Certified Registered Nurse Anesthetist

## 2013-06-30 ENCOUNTER — Encounter (HOSPITAL_COMMUNITY): Payer: Self-pay | Admitting: *Deleted

## 2013-06-30 ENCOUNTER — Ambulatory Visit (HOSPITAL_COMMUNITY)
Admission: RE | Admit: 2013-06-30 | Discharge: 2013-06-30 | Disposition: A | Discharge status: Home / Self Care | Payer: Medicare Other | Source: Ambulatory Visit | Attending: Orthopedic Surgery | Admitting: Orthopedic Surgery

## 2013-06-30 ENCOUNTER — Ambulatory Visit (HOSPITAL_COMMUNITY): Payer: Medicare Other | Admitting: Certified Registered Nurse Anesthetist

## 2013-06-30 DIAGNOSIS — T8489XA Other specified complication of internal orthopedic prosthetic devices, implants and grafts, initial encounter: Secondary | ICD-10-CM | POA: Insufficient documentation

## 2013-06-30 DIAGNOSIS — E039 Hypothyroidism, unspecified: Secondary | ICD-10-CM | POA: Insufficient documentation

## 2013-06-30 DIAGNOSIS — Z96649 Presence of unspecified artificial hip joint: Secondary | ICD-10-CM | POA: Insufficient documentation

## 2013-06-30 DIAGNOSIS — M899 Disorder of bone, unspecified: Secondary | ICD-10-CM | POA: Insufficient documentation

## 2013-06-30 DIAGNOSIS — Z853 Personal history of malignant neoplasm of breast: Secondary | ICD-10-CM | POA: Insufficient documentation

## 2013-06-30 DIAGNOSIS — Y831 Surgical operation with implant of artificial internal device as the cause of abnormal reaction of the patient, or of later complication, without mention of misadventure at the time of the procedure: Secondary | ICD-10-CM | POA: Insufficient documentation

## 2013-06-30 DIAGNOSIS — Z7982 Long term (current) use of aspirin: Secondary | ICD-10-CM | POA: Insufficient documentation

## 2013-06-30 DIAGNOSIS — I10 Essential (primary) hypertension: Secondary | ICD-10-CM | POA: Insufficient documentation

## 2013-06-30 DIAGNOSIS — Z79899 Other long term (current) drug therapy: Secondary | ICD-10-CM | POA: Insufficient documentation

## 2013-06-30 DIAGNOSIS — D649 Anemia, unspecified: Secondary | ICD-10-CM | POA: Insufficient documentation

## 2013-06-30 DIAGNOSIS — Z9010 Acquired absence of unspecified breast and nipple: Secondary | ICD-10-CM | POA: Insufficient documentation

## 2013-06-30 DIAGNOSIS — M25559 Pain in unspecified hip: Secondary | ICD-10-CM | POA: Insufficient documentation

## 2013-06-30 DIAGNOSIS — M949 Disorder of cartilage, unspecified: Secondary | ICD-10-CM

## 2013-06-30 DIAGNOSIS — T8484XA Pain due to internal orthopedic prosthetic devices, implants and grafts, initial encounter: Secondary | ICD-10-CM

## 2013-06-30 HISTORY — PX: HARDWARE REMOVAL: SHX979

## 2013-06-30 SURGERY — REMOVAL, HARDWARE
Anesthesia: General | Site: Hip | Laterality: Left

## 2013-06-30 MED ORDER — BUPIVACAINE HCL (PF) 0.25 % IJ SOLN
INTRAMUSCULAR | Status: AC
  Filled 2013-06-30: qty 30

## 2013-06-30 MED ORDER — HYDROMORPHONE HCL PF 1 MG/ML IJ SOLN: 0.2500 mg | INTRAMUSCULAR | Status: DC | PRN

## 2013-06-30 MED ORDER — KETOROLAC TROMETHAMINE 30 MG/ML IJ SOLN
INTRAMUSCULAR | Status: DC | PRN
  Administered 2013-06-30: 30 mg via INTRAVENOUS

## 2013-06-30 MED ORDER — DEXAMETHASONE SODIUM PHOSPHATE 10 MG/ML IJ SOLN
INTRAMUSCULAR | Status: DC | PRN
  Administered 2013-06-30: 10 mg via INTRAVENOUS

## 2013-06-30 MED ORDER — PROPOFOL 10 MG/ML IV BOLUS
INTRAVENOUS | Status: DC | PRN
  Administered 2013-06-30: 170 mg via INTRAVENOUS

## 2013-06-30 MED ORDER — BUPIVACAINE HCL 0.25 % IJ SOLN
INTRAMUSCULAR | Status: DC | PRN
  Administered 2013-06-30: 30 mL

## 2013-06-30 MED ORDER — CEFAZOLIN SODIUM-DEXTROSE 2-3 GM-% IV SOLR
2.0000 g | INTRAVENOUS | Status: AC
  Administered 2013-06-30: 2 g via INTRAVENOUS

## 2013-06-30 MED ORDER — CEFAZOLIN SODIUM-DEXTROSE 2-3 GM-% IV SOLR
INTRAVENOUS | Status: AC
  Filled 2013-06-30: qty 50

## 2013-06-30 MED ORDER — OXYCODONE HCL 5 MG/5ML PO SOLN
5.0000 mg | Freq: Once | ORAL | Status: AC | PRN
  Filled 2013-06-30: qty 5

## 2013-06-30 MED ORDER — MEPERIDINE HCL 50 MG/ML IJ SOLN: 6.2500 mg | INTRAMUSCULAR | Status: DC | PRN

## 2013-06-30 MED ORDER — ONDANSETRON HCL 4 MG/2ML IJ SOLN
INTRAMUSCULAR | Status: DC | PRN
  Administered 2013-06-30: 4 mg via INTRAVENOUS

## 2013-06-30 MED ORDER — MIDAZOLAM HCL 2 MG/2ML IJ SOLN
INTRAMUSCULAR | Status: AC
  Filled 2013-06-30: qty 2

## 2013-06-30 MED ORDER — ONDANSETRON HCL 4 MG/2ML IJ SOLN
INTRAMUSCULAR | Status: AC
  Filled 2013-06-30: qty 2

## 2013-06-30 MED ORDER — 0.9 % SODIUM CHLORIDE (POUR BTL) OPTIME
TOPICAL | Status: DC | PRN
  Administered 2013-06-30: 1000 mL

## 2013-06-30 MED ORDER — PROMETHAZINE HCL 25 MG/ML IJ SOLN: 6.2500 mg | INTRAMUSCULAR | Status: DC | PRN

## 2013-06-30 MED ORDER — OXYCODONE HCL 5 MG PO TABS
5.0000 mg | ORAL_TABLET | Freq: Once | ORAL | Status: AC | PRN
Start: 2013-06-30 — End: 2013-06-30

## 2013-06-30 MED ORDER — SODIUM CHLORIDE 0.9 % IV SOLN: INTRAVENOUS | Status: DC

## 2013-06-30 MED ORDER — LACTATED RINGERS IV SOLN
INTRAVENOUS | Status: DC | PRN
  Administered 2013-06-30 (×2): via INTRAVENOUS

## 2013-06-30 MED ORDER — DEXAMETHASONE SODIUM PHOSPHATE 10 MG/ML IJ SOLN: 10.0000 mg | Freq: Once | INTRAMUSCULAR | Status: DC

## 2013-06-30 MED ORDER — CHLORHEXIDINE GLUCONATE 4 % EX LIQD: 60.0000 mL | Freq: Once | CUTANEOUS | Status: DC

## 2013-06-30 MED ORDER — LIDOCAINE HCL (CARDIAC) 20 MG/ML IV SOLN
INTRAVENOUS | Status: AC
  Filled 2013-06-30: qty 5

## 2013-06-30 MED ORDER — TRAMADOL HCL 50 MG PO TABS: 50.0000 mg | ORAL_TABLET | Freq: Four times a day (QID) | ORAL | Status: DC | PRN

## 2013-06-30 MED ORDER — ACETAMINOPHEN 10 MG/ML IV SOLN
1000.0000 mg | Freq: Once | INTRAVENOUS | Status: AC
  Administered 2013-06-30: 1000 mg via INTRAVENOUS
  Filled 2013-06-30: qty 100

## 2013-06-30 MED ORDER — DEXAMETHASONE SODIUM PHOSPHATE 10 MG/ML IJ SOLN
INTRAMUSCULAR | Status: AC
  Filled 2013-06-30: qty 1

## 2013-06-30 MED ORDER — LIDOCAINE HCL (CARDIAC) 20 MG/ML IV SOLN
INTRAVENOUS | Status: DC | PRN
  Administered 2013-06-30: 50 mg via INTRAVENOUS

## 2013-06-30 MED ORDER — TRAMADOL HCL 50 MG PO TABS
50.0000 mg | ORAL_TABLET | Freq: Four times a day (QID) | ORAL | Status: AC
  Administered 2013-06-30: 50 mg via ORAL

## 2013-06-30 MED ORDER — PROPOFOL 10 MG/ML IV BOLUS
INTRAVENOUS | Status: AC
  Filled 2013-06-30: qty 20

## 2013-06-30 SURGICAL SUPPLY — 45 items
BANDAGE ELASTIC 6 VELCRO ST LF (GAUZE/BANDAGES/DRESSINGS) ×3 IMPLANT
BANDAGE ESMARK 6X9 LF (GAUZE/BANDAGES/DRESSINGS) ×1 IMPLANT
BNDG ESMARK 6X9 LF (GAUZE/BANDAGES/DRESSINGS) ×3
CLOSURE WOUND 1/2 X4 (GAUZE/BANDAGES/DRESSINGS)
CUFF TOURN SGL QUICK 18 (TOURNIQUET CUFF) IMPLANT
CUFF TOURN SGL QUICK 34 (TOURNIQUET CUFF)
CUFF TRNQT CYL 34X4X40X1 (TOURNIQUET CUFF) IMPLANT
DRAPE C-ARM 42X120 X-RAY (DRAPES) IMPLANT
DRAPE C-ARMOR (DRAPES) IMPLANT
DRAPE EXTREMITY T 121X128X90 (DRAPE) ×3 IMPLANT
DRAPE INCISE IOBAN 66X45 STRL (DRAPES) ×3 IMPLANT
DRAPE ORTHO SPLIT 77X108 STRL (DRAPES)
DRAPE SURG ORHT 6 SPLT 77X108 (DRAPES) IMPLANT
DRSG ADAPTIC 3X8 NADH LF (GAUZE/BANDAGES/DRESSINGS) ×3 IMPLANT
DRSG MEPILEX BORDER 4X8 (GAUZE/BANDAGES/DRESSINGS) ×3 IMPLANT
DURAPREP 26ML APPLICATOR (WOUND CARE) ×3 IMPLANT
ELECT REM PT RETURN 9FT ADLT (ELECTROSURGICAL) ×3
ELECTRODE REM PT RTRN 9FT ADLT (ELECTROSURGICAL) ×1 IMPLANT
GLOVE BIO SURGEON STRL SZ7.5 (GLOVE) ×3 IMPLANT
GLOVE BIO SURGEON STRL SZ8 (GLOVE) ×6 IMPLANT
GLOVE BIOGEL PI IND STRL 8 (GLOVE) ×3 IMPLANT
GLOVE BIOGEL PI INDICATOR 8 (GLOVE) ×6
GOWN STRL REUS W/TWL LRG LVL3 (GOWN DISPOSABLE) ×3 IMPLANT
GOWN STRL REUS W/TWL XL LVL3 (GOWN DISPOSABLE) ×3 IMPLANT
KIT BASIN OR (CUSTOM PROCEDURE TRAY) ×3 IMPLANT
MANIFOLD NEPTUNE II (INSTRUMENTS) ×3 IMPLANT
NDL SAFETY ECLIPSE 18X1.5 (NEEDLE) ×1 IMPLANT
NEEDLE HYPO 18GX1.5 SHARP (NEEDLE) ×2
NS IRRIG 1000ML POUR BTL (IV SOLUTION) ×3 IMPLANT
PACK TOTAL JOINT (CUSTOM PROCEDURE TRAY) ×3 IMPLANT
PAD ABD 8X10 STRL (GAUZE/BANDAGES/DRESSINGS) ×3 IMPLANT
PADDING CAST COTTON 6X4 STRL (CAST SUPPLIES) ×3 IMPLANT
POSITIONER SURGICAL ARM (MISCELLANEOUS) ×3 IMPLANT
SPONGE GAUZE 4X4 12PLY (GAUZE/BANDAGES/DRESSINGS) ×3 IMPLANT
STAPLER VISISTAT 35W (STAPLE) IMPLANT
STRIP CLOSURE SKIN 1/2X4 (GAUZE/BANDAGES/DRESSINGS) IMPLANT
SUT MNCRL AB 4-0 PS2 18 (SUTURE) ×3 IMPLANT
SUT VIC AB 0 CT1 36 (SUTURE) ×6 IMPLANT
SUT VIC AB 1 CT1 36 (SUTURE) ×6 IMPLANT
SUT VIC AB 2-0 CT1 27 (SUTURE) ×4
SUT VIC AB 2-0 CT1 TAPERPNT 27 (SUTURE) ×2 IMPLANT
SYR 30ML LL (SYRINGE) ×3 IMPLANT
TOWEL OR 17X26 10 PK STRL BLUE (TOWEL DISPOSABLE) ×6 IMPLANT
UNDERPAD 30X30 INCONTINENT (UNDERPADS AND DIAPERS) ×3 IMPLANT
WATER STERILE IRR 1500ML POUR (IV SOLUTION) IMPLANT

## 2013-06-30 NOTE — Brief Op Note (Signed)
06/30/2013  6:36 PM  PATIENT:  [name]  80 y.o. female  PRE-OPERATIVE DIAGNOSIS:  PAINFUL HARDWARE LEFT HIP  POST-OPERATIVE DIAGNOSIS:  painful hardware left hip  PROCEDURE:  Procedure(s): LEFT HIP HARDWARE REMOVAL OF CABLES (Left)  SURGEON:  Surgeon(s) and Role:    * Gearlean Alf, MD - Primary  PHYSICIAN ASSISTANT:   ASSISTANTS: Arlee Muslim, PA-C   ANESTHESIA:   general  EBL:  Total I/O In: 1000 [I.V.:1000] Out: 50 [Blood:50]  DRAINS: none   LOCAL MEDICATIONS USED:  MARCAINE     COUNTS:  YES  TOURNIQUET:  * No tourniquets in log *  DICTATION: .Other Dictation: Dictation Number [id number]  PLAN OF CARE: Discharge to home after PACU  PATIENT DISPOSITION:  PACU - hemodynamically stable.

## 2013-06-30 NOTE — Anesthesia Preprocedure Evaluation (Addendum)
Anesthesia Evaluation  Patient identified by MRN, date of birth, ID band Patient awake    Reviewed: Allergy & Precautions, H&P , NPO status , Patient's Chart, lab work & pertinent test results  History of Anesthesia Complications Negative for: history of anesthetic complications  Airway Mallampati: II TM Distance: >3 FB Neck ROM: full    Dental no notable dental hx. (+) Caps, Dental Advisory Given, Teeth Intact,    Pulmonary neg pulmonary ROS, former smoker,  breath sounds clear to auscultation  Pulmonary exam normal       Cardiovascular Exercise Tolerance: Good hypertension, Pt. on medications Rhythm:regular Rate:Normal     Neuro/Psych negative neurological ROS  negative psych ROS   GI/Hepatic negative GI ROS, Neg liver ROS,   Endo/Other  Hypothyroidism   Renal/GU negative Renal ROS     Musculoskeletal   Abdominal   Peds  Hematology  (+) anemia ,   Anesthesia Other Findings   Reproductive/Obstetrics negative OB ROS                          Anesthesia Physical  Anesthesia Plan  ASA: II  Anesthesia Plan: General   Post-op Pain Management:    Induction: Intravenous  Airway Management Planned: LMA  Additional Equipment:   Intra-op Plan:   Post-operative Plan: Extubation in OR  Informed Consent: I have reviewed the patients History and Physical, chart, labs and discussed the procedure including the risks, benefits and alternatives for the proposed anesthesia with the patient or authorized representative who has indicated his/her understanding and acceptance.   Dental advisory given  Plan Discussed with: CRNA  Anesthesia Plan Comments:       Anesthesia Quick Evaluation

## 2013-06-30 NOTE — Anesthesia Postprocedure Evaluation (Signed)
Anesthesia Post Note  Patient: [name]  Procedure(s) Performed: Procedure(s) (LRB): LEFT HIP HARDWARE REMOVAL OF CABLES (Left)  Anesthesia type: General  Patient location: PACU  Post pain: Pain level controlled  Post assessment: Post-op Vital signs reviewed  Last Vitals: BP 169/76  Pulse 52  Temp(Src) 36.4 C  Resp 15  SpO2 100%  Post vital signs: Reviewed  Level of consciousness: sedated  Complications: No apparent anesthesia complications

## 2013-06-30 NOTE — Transfer of Care (Signed)
Immediate Anesthesia Transfer of Care Note  Patient: [name]  Procedure(s) Performed: Procedure(s): LEFT HIP HARDWARE REMOVAL OF CABLES (Left)  Patient Location: PACU  Anesthesia Type:General  Level of Consciousness: awake, alert , oriented and patient cooperative  Airway & Oxygen Therapy: Patient Spontanous Breathing and Patient connected to face mask oxygen  Post-op Assessment: Report given to PACU RN, Post -op Vital signs reviewed and stable and Patient moving all extremities X 4  Post vital signs: stable  Complications: No apparent anesthesia complications

## 2013-06-30 NOTE — Interval H&P Note (Signed)
History and Physical Interval Note:  06/30/2013 4:49 PM  [name]  has presented today for surgery, with the diagnosis of PAINFUL HARDWARE LEFT HIP  The various methods of treatment have been discussed with the patient and family. After consideration of risks, benefits and other options for treatment, the patient has consented to  Procedure(s): LEFT HIP HARDWARE REMOVAL OF CABLES (Left) as a surgical intervention .  The patient's history has been reviewed, patient examined, no change in status, stable for surgery.  I have reviewed the patient's chart and labs.  Questions were answered to the patient's satisfaction.     Gearlean Alf

## 2013-07-01 NOTE — Op Note (Signed)
NAMEJOLYN, [name] NO.:  [id number]  MEDICAL RECORD NO.:  [id number]  LOCATION:  [facility]                         FACILITY:  [facility]  PHYSICIAN:  Gaynelle Arabian, M.D.    DATE OF BIRTH:  October 22, 1931  DATE OF PROCEDURE:  06/30/2013 DATE OF DISCHARGE:                              OPERATIVE REPORT   POSTOPERATIVE DIAGNOSIS:  Painful hardware, left hip.  POSTOPERATIVE DIAGNOSIS:  Painful hardware, left hip.  PROCEDURE:  Left hip hardware removal.  SURGEON:  Gaynelle Arabian, MD  ASSISTANT:  Alexzandrew L. Dara Lords, PA-C  ANESTHESIA:  General.  ESTIMATED BLOOD LOSS:  Minimal.  DRAINS:  None.  COMPLICATIONS:  None.  CONDITION:  Stable to recovery.  BRIEF CLINICAL NOTE:  [name] is a 80 year old female, who underwent a left total hip arthroplasty revision  approximately a year ago.  She has had problems with persistent lateral discomfort in the hip.  She is tender directly over the area where there are cerclage wires present. She presents now for removal of these wires.  PROCEDURE IN DETAIL:  After successful administration of general anesthetic, the patient was placed in a right lateral decubitus position with the left side up and held with the hip positioner.  The left lower extremity was then prepped and draped in the usual sterile fashion.  A short lateral incision was made over the area where the cables are present.  Skin was cut with a 10 blade through subcutaneous tissue to the fascia lata which was incised in line with the skin incision. Fascia of the vastus lateralis was also incised to get down to the lateral cortex of the femur.  The cables are identified.  There was a butterfly fragment of bone that was being supported by the cables.  It healed with fibrous union.  It was not full bony union but there was fibrous union present and the piece was stable.  I removed the 3 cables. The proximal-most cable had a fair amount of bursal fluid  surrounding it, thus was probably the one causing all the irritation.  We then thoroughly irrigated the wound with saline solution.  I injected a total of 30 mL of 0.25% plain Marcaine into the deep tissues and subcu tissues.  The fascia of the vastus lateralis was then closed with a running #1 Vicryl suture.  Fascia lata closed with running #1 Vicryl suture.  Subcu closed with interrupted 2-0 Vicryl and subcuticular running 4-0 Monocryl.  The incision was then cleaned and dried, and sterile dressing applied.  She was then awakened and transported to recovery in stable condition.  Note, that a surgical assistant was a medical necessity for this procedure to allow for adequate retraction of the muscle and deep tissues to gain access to the cables without making an excessively large incision.  Also necessary for protection of the posterior neural structures while dissecting to get the cables.     Gaynelle Arabian, M.D.     FA/MEDQ  D:  06/30/2013  T:  07/01/2013  Job:  [id number]

## 2013-07-02 ENCOUNTER — Encounter (HOSPITAL_COMMUNITY): Payer: Self-pay | Admitting: Orthopedic Surgery

## 2013-07-08 ENCOUNTER — Encounter: Payer: Self-pay | Admitting: Internal Medicine

## 2013-07-08 ENCOUNTER — Ambulatory Visit (INDEPENDENT_AMBULATORY_CARE_PROVIDER_SITE_OTHER): Payer: Medicare Other | Admitting: Internal Medicine

## 2013-07-08 VITALS — BP 110/70 | HR 88 | Ht 65.0 in | Wt 126.2 lb

## 2013-07-08 DIAGNOSIS — Z1211 Encounter for screening for malignant neoplasm of colon: Secondary | ICD-10-CM

## 2013-07-08 NOTE — Patient Instructions (Signed)
Cc: Dr Horald Pollen

## 2013-07-08 NOTE — Progress Notes (Signed)
[name] [name] [birth year] [id number]  Note: This dictation was prepared with Dragon digital system. Any transcriptional errors that result from this procedure are unintentional.   History of Present Illness:  This is a 80 year old white female who is here to discuss having a recall  colonoscopy. She had normal colonoscopies in 1994 and February 2005. She denies any GI symptoms. She is on probiotics and a high-fiber diet. She denies rectal bleeding. There is no family history of colon cancer. She has had a complicated medical history involving a left hip replacement and removal of painful hardware. She also has a history of breast cancer in 1989.    Past Medical History  Diagnosis Date  . Hypothyroidism 3-21- 13    tx. levothyroxine  . Blood transfusion 04-10-2012    post surgery some time ago  . Arthritis 07-27-11    osteoarthritis-hips/s/p bil. THA, arthritis right hand   . Hypertension   . Anemia     hx of with surgery   . Hard of hearing     both ears mild loss  . Cancer 07-27-11    Breast cancer -s/p lt. mastectomy, some squamous cell lesions  . Complication of anesthesia     epinephrine - screams uncontrollably / pt does not want general anesthesia or narcotics  . Cold hands   . Osteopenia   . Constipation     Past Surgical History  Procedure Laterality Date  . Abdominal hysterectomy    . Ankle surgery  2005    right ankle tendon repair  . Cataract extraction, bilateral  few yrs ago    Bilateral  . Squamous cell carcinoma excision  07-27-11    x2 left shoulder  . Orif femur fracture  07/31/2011    Procedure: OPEN REDUCTION INTERNAL FIXATION (ORIF) DISTAL FEMUR FRACTURE;  Surgeon: Gearlean Alf, MD;  Location: [facility];  Service: Orthopedics;  Laterality: Right;  right femur  (c-arm)   . Hardware removal  04/05/2012    Procedure: HARDWARE REMOVAL;  Surgeon: Gearlean Alf, MD;  Location: [facility];  Service: Orthopedics;  Laterality: Right;  Removal of Hardware Right Femur    . Total hip revision  04/10/2012    Procedure: TOTAL HIP REVISION;  Surgeon: Gearlean Alf, MD;  Location: [facility];  Service: Orthopedics;  Laterality: Right;  . Breast surgery  07-1987    lt.breast cancer intraductal cancer, mastectomy  . Joint replacement  07-27-11    Bilateral: Right THA - 04/2000, Left THA 06/2001  . Left hip replacement Left feb 2003  . Total hip revision Left 06/27/2012    Procedure: LEFT TOTAL HIP REVISION;  Surgeon: Gearlean Alf, MD;  Location: [facility];  Service: Orthopedics;  Laterality: Left;  Marland Kitchen Mastectomy  1989    left - Pt states has no restrictions on use of L arm for BP or blood draw  . Hardware removal Left 06/30/2013    Procedure: LEFT HIP HARDWARE REMOVAL OF CABLES;  Surgeon: Gearlean Alf, MD;  Location: [facility];  Service: Orthopedics;  Laterality: Left;    Allergies  Allergen Reactions  . Epinephrine Other (See Comments)     Caused Scream uncontrollably    Family history and social history have been reviewed.  Review of Systems: Negative for rectal bleeding constipation abdominal pain  The remainder of the 10 point ROS is negative except as outlined in the H&P  Physical Exam: General Appearance Well developed, in no distress Eyes  Non icteric  HEENT  Non traumatic, normocephalic  Mouth No lesion, tongue papillated, no cheilosis Neck Supple without adenopathy, thyroid not enlarged, no carotid bruits, no JVD Lungs Clear to auscultation bilaterally COR Normal S1, normal S2, regular rhythm, no murmur, quiet precordium Abdomen soft scaphoid abdomen with normoactive bowel sounds. No distention. No palpable mass Rectal soft Hemoccult negative stool Extremities  No pedal edema extensive scarring of the left hip, patient is limping Skin No lesions Neurological Alert and oriented x 3 Psychological Normal mood and affect  Assessment and Plan:   Problem #80 80 year old white female who is due for a recall colonoscopy. She does not desire to have a  screening colonoscopy at her age. She has no risk factors for colon cancer such as family history or symptoms. She is comfortable with not having another colonoscopy.and I will go along with that. I will be happy to see her in the future if she develops specific GI problems.    [name] 07/08/2013

## 2013-07-22 ENCOUNTER — Other Ambulatory Visit: Payer: Self-pay | Admitting: Orthopedic Surgery

## 2013-08-07 ENCOUNTER — Encounter (HOSPITAL_COMMUNITY): Payer: Self-pay | Admitting: Pharmacy Technician

## 2013-08-13 NOTE — Patient Instructions (Addendum)
[place]  08/13/2013   Your procedure is scheduled on: Thursday April 16th  Report to [facility] at 115 PM  Call this number if you have problems the morning of surgery [phone]   Remember:  Do not eat food  :After Midnight, clear liquids midnight until 945 am day of sugrery, then nothing by mouth.     Take these medicines the morning of surgery with A SIP OF WATER: Amlodipine, levothyroxine, eye drop if needed                               You may not have any metal on your body including hair pins and piercings  Do not wear jewelry, make-up, lotions, powders, or deodorant.   Men may shave face and neck.  Do not bring valuables to the hospital. [place].  Contacts, dentures or bridgework may not be worn into surgery.  Leave suitcase in the car. After surgery it may be brought to your room.  For patients admitted to the hospital, checkout time is 11:00 AM the day of discharge.    [facility] - Preparing for Surgery Before surgery, you can play an important role.  Because skin is not sterile, your skin needs to be as free of germs as possible.  You can reduce the number of germs on your skin by washing with CHG (chlorahexidine gluconate) soap before surgery.  CHG is an antiseptic cleaner which kills germs and bonds with the skin to continue killing germs even after washing. Please DO NOT use if you have an allergy to CHG or antibacterial soaps.  If your skin becomes reddened/irritated stop using the CHG and inform your nurse when you arrive at Short Stay. Do not shave (including legs and underarms) for at least 48 hours prior to the first CHG shower.  You may shave your face. Please follow these instructions carefully:  1.  Shower with CHG Soap the night before surgery and the  morning of Surgery.  2.  If you choose to wash your hair, wash your hair first as usual with your  normal  shampoo.  3.  After you shampoo, rinse  your hair and body thoroughly to remove the  shampoo.                           4.  Use CHG as you would any other liquid soap.  You can apply chg directly  to the skin and wash                       Gently with a scrungie or clean washcloth.  5.  Apply the CHG Soap to your body ONLY FROM THE NECK DOWN.   Do not use on open                           Wound or open sores. Avoid contact with eyes, ears mouth and genitals (private parts).                        Genitals (private parts) with your normal soap.             6.  Wash thoroughly, paying special attention to the area where your surgery  will be performed.  7.  Thoroughly rinse your body with warm water from the neck down.  8.  DO NOT shower/wash with your normal soap after using and rinsing off  the CHG Soap.                9.  Pat yourself dry with a clean towel.            10.  Wear clean pajamas.            11.  Place clean sheets on your bed the night of your first shower and do not  sleep with pets. Day of Surgery : Do not apply any lotions/deodorants the morning of surgery.  Please wear clean clothes to the hospital/surgery center.  FAILURE TO FOLLOW THESE INSTRUCTIONS MAY RESULT IN THE CANCELLATION OF YOUR SURGERY PATIENT SIGNATURE_________________________________  NURSE SIGNATURE__________________________________     CLEAR LIQUID DIET   Foods Allowed                                                                     Foods Excluded  Coffee and tea, regular and decaf                             liquids that you cannot  Plain Jell-O in any flavor                                             see through such as: Fruit ices (not with fruit pulp)                                     milk, soups, orange juice  Iced Popsicles                                    All solid food Carbonated beverages, regular and diet                                    Cranberry, grape and apple juices Sports drinks like Gatorade Lightly seasoned  clear broth or consume(fat free) Sugar, honey syrup  Sample Menu Breakfast                                Lunch                                     Supper Cranberry juice                    Beef broth                            Chicken broth Jell-O  Grape juice                           Apple juice Coffee or tea                        Jell-O                                      Popsicle                                                Coffee or tea                        Coffee or tea Incentive Spirometer  An incentive spirometer is a tool that can help keep your lungs clear and active. This tool measures how well you are filling your lungs with each breath. Taking long deep breaths may help reverse or decrease the chance of developing breathing (pulmonary) problems (especially infection) following:  A long period of time when you are unable to move or be active. BEFORE THE PROCEDURE   If the spirometer includes an indicator to show your best effort, your nurse or respiratory therapist will set it to a desired goal.  If possible, sit up straight or lean slightly forward. Try not to slouch.  Hold the incentive spirometer in an upright position. INSTRUCTIONS FOR USE  1. Sit on the edge of your bed if possible, or sit up as far as you can in bed or on a chair. 2. Hold the incentive spirometer in an upright position. 3. Breathe out normally. 4. Place the mouthpiece in your mouth and seal your lips tightly around it. 5. Breathe in slowly and as deeply as possible, raising the piston or the ball toward the top of the column. 6. Hold your breath for 3-5 seconds or for as long as possible. Allow the piston or ball to fall to the bottom of the column. 7. Remove the mouthpiece from your mouth and breathe out normally. 8. Rest for a few seconds and repeat Steps 1 through 7 at least 10 times every 1-2 hours when you are awake. Take your time and take a few normal  breaths between deep breaths. 9. The spirometer may include an indicator to show your best effort. Use the indicator as a goal to work toward during each repetition. 10. After each set of 10 deep breaths, practice coughing to be sure your lungs are clear. If you have an incision (the cut made at the time of surgery), support your incision when coughing by placing a pillow or rolled up towels firmly against it. Once you are able to get out of bed, walk around indoors and cough well. You may stop using the incentive spirometer when instructed by your caregiver.  RISKS AND COMPLICATIONS  Take your time so you do not get dizzy or light-headed.  If you are in pain, you may need to take or ask for pain medication before doing incentive spirometry. It is harder to take a deep breath if you are having pain. AFTER USE  Rest and breathe slowly and easily.  It can be helpful to keep track of  a log of your progress. Your caregiver can provide you with a simple table to help with this. If you are using the spirometer at home, follow these instructions: [place] IF:   You are having difficultly using the spirometer.  You have trouble using the spirometer as often as instructed.  Your pain medication is not giving enough relief while using the spirometer.  You develop fever of 100.5 F (38.1 C) or higher. SEEK IMMEDIATE MEDICAL CARE IF:   You cough up bloody sputum that had not been present before.  You develop fever of 102 F (38.9 C) or greater.  You develop worsening pain at or near the incision site. MAKE SURE YOU:   Understand these instructions.  Will watch your condition.  Will get help right away if you are not doing well or get worse. Document Released: 09/04/2006 Document Revised: 07/17/2011 Document Reviewed: 11/05/2006 [facility] Patient Information 2014 [place], [place].

## 2013-08-13 NOTE — Progress Notes (Signed)
[facility] 06-24-13 epic Chest 2 view xray 06-24-13 epic

## 2013-08-14 ENCOUNTER — Encounter (HOSPITAL_COMMUNITY): Payer: Self-pay

## 2013-08-14 ENCOUNTER — Encounter (HOSPITAL_COMMUNITY)
Admission: RE | Admit: 2013-08-14 | Discharge: 2013-08-14 | Disposition: A | Discharge status: Home / Self Care | Payer: Medicare Other | Source: Ambulatory Visit | Attending: Orthopedic Surgery | Admitting: Orthopedic Surgery

## 2013-08-14 DIAGNOSIS — Z01812 Encounter for preprocedural laboratory examination: Secondary | ICD-10-CM | POA: Insufficient documentation

## 2013-08-14 HISTORY — DX: Endothelial corneal dystrophy: H18.51

## 2013-08-14 HISTORY — DX: Endothelial corneal dystrophy, unspecified eye: H18.519

## 2013-08-14 LAB — BASIC METABOLIC PANEL
BUN: 17 mg/dL (ref 6–23)
CHLORIDE: 100 meq/L (ref 96–112)
CO2: 29 mEq/L (ref 19–32)
CREATININE: 0.66 mg/dL (ref 0.50–1.10)
Calcium: 9.7 mg/dL (ref 8.4–10.5)
GFR calc Af Amer: >90 mL/min (ref >90)
GFR, EST NON AFRICAN AMERICAN: 81 mL/min — AB (ref >90)
Glucose, Bld: 110 mg/dL — ABNORMAL HIGH (ref 70–99)
POTASSIUM: 4.9 meq/L (ref 3.7–5.3)
Sodium: 138 mEq/L (ref 137–147)

## 2013-08-14 LAB — CBC
HCT: 36.2 % (ref 36.0–46.0)
Hemoglobin: 11.9 g/dL — ABNORMAL LOW (ref 12.0–15.0)
MCH: 29.1 pg (ref 26.0–34.0)
MCHC: 32.9 g/dL (ref 30.0–36.0)
MCV: 88.5 fL (ref 78.0–100.0)
PLATELETS: 225 10*3/uL (ref 150–400)
RBC: 4.09 MIL/uL (ref 3.87–5.11)
RDW: 13.6 % (ref 11.5–15.5)
WBC: 6.7 10*3/uL (ref 4.0–10.5)

## 2013-08-20 NOTE — H&P (Signed)
CC- [name] is a 80 y.o. female who presents with right hip pain  Hip Pain: Patient complains of right hip pain. Onset of the symptoms was several months ago. She has had revision hip surgery with cerclage cables around the femur and now has pain related to those cables. She had similar pain in the left hip which resolved once the cables were removed. She presents now for hardware removal.  Past Medical History  Diagnosis Date  . Hypothyroidism 3-21- 13    tx. levothyroxine  . Blood transfusion feb 2014    post surgery some time ago  . Arthritis 07-27-11    osteoarthritis-hips/s/p bil. THA, arthritis right hand   . Hypertension   . Anemia     hx of with surgery   . Hard of hearing     both ears mild loss  . Complication of anesthesia     epinephrine - screams uncontrollably / pt does not want general anesthesia or narcotics  . Cold hands   . Osteopenia   . Constipation   . Cancer 1989    Breast cancer -s/p lt. mastectomy, some squamous cell lesions  . Fuchs' corneal dystrophy     both eyes    Past Surgical History  Procedure Laterality Date  . Ankle surgery  2005    right ankle tendon repair  . Cataract extraction, bilateral  few yrs ago    Bilateral  . Squamous cell carcinoma excision  07-27-11    x2 left shoulder  . Orif femur fracture  07/31/2011    Procedure: OPEN REDUCTION INTERNAL FIXATION (ORIF) DISTAL FEMUR FRACTURE;  Surgeon: Gearlean Alf, MD;  Location: [facility];  Service: Orthopedics;  Laterality: Right;  right femur  (c-arm)   . Hardware removal  04/05/2012    Procedure: HARDWARE REMOVAL;  Surgeon: Gearlean Alf, MD;  Location: [facility];  Service: Orthopedics;  Laterality: Right;  Removal of Hardware Right Femur   . Total hip revision  04/10/2012    Procedure: TOTAL HIP REVISION;  Surgeon: Gearlean Alf, MD;  Location: [facility];  Service: Orthopedics;  Laterality: Right;  . Breast surgery  07-1987    lt.breast cancer intraductal cancer, mastectomy  . Joint  replacement  07-27-11    Bilateral: Right THA - 04/2000, Left THA 06/2001  . Left hip replacement Left feb 2003  . Total hip revision Left 06/27/2012    Procedure: LEFT TOTAL HIP REVISION;  Surgeon: Gearlean Alf, MD;  Location: [facility];  Service: Orthopedics;  Laterality: Left;  Marland Kitchen Mastectomy  1989    left - Pt states has no restrictions on use of L arm for BP or blood draw  . Hardware removal Left 06/30/2013    Procedure: LEFT HIP HARDWARE REMOVAL OF CABLES;  Surgeon: Gearlean Alf, MD;  Location: [facility];  Service: Orthopedics;  Laterality: Left;  Marland Kitchen Melanoma removed Left     x 1  . Basal cell removed from face      3-4 times  . Abdominal hysterectomy      ovaries removed    Prior to Admission medications   Medication Sig Start Date End Date Taking? Authorizing Provider  amLODipine (NORVASC) 5 MG tablet Take 5 mg by mouth daily before breakfast.     Historical Provider, MD  aspirin EC 81 MG tablet Take 81 mg by mouth at bedtime.    Historical Provider, MD  CALCIUM-VITAMIN D PO Take 1 tablet by mouth daily.  Historical Provider, MD  Hyaluronic Acid-Vitamin C (HYALURONIC ACID PO) Take 1 tablet by mouth daily.    Historical Provider, MD  levothyroxine (SYNTHROID, LEVOTHROID) 125 MCG tablet Take 125 mcg by mouth daily before breakfast.    Historical Provider, MD  methylcellulose (ARTIFICIAL TEARS) 1 % ophthalmic solution Place 1 drop into both eyes 2 (two) times daily as needed (dry eyes).    Historical Provider, MD  Multiple Vitamins-Iron (MULTIVITAMINS WITH IRON) TABS tablet Take 2 tablets by mouth daily.     Historical Provider, MD  naproxen sodium (ANAPROX) 220 MG tablet Take 220 mg by mouth daily as needed (pain).     Historical Provider, MD  Omega-3 Fatty Acids (FISH OIL) 1200 MG CAPS Take 1,200 mg by mouth daily.    Historical Provider, MD  OVER THE COUNTER MEDICATION Take 1 tablet by mouth every other day. sunflower levithin supplement    Historical Provider, MD    Physical  Examination: General appearance - alert, well appearing, and in no distress Mental status - alert, oriented to person, place, and time Chest - clear to auscultation, no wheezes, rales or rhonchi, symmetric air entry Heart - normal rate, regular rhythm, normal S1, S2, no murmurs, rubs, clicks or gallops Abdomen - soft, nontender, nondistended, no masses or organomegaly Neurological - alert, oriented, normal speech, no focal findings or movement disorder noted  A right hip exam was performed. SKIN: intact SWELLING: none WARMTH: no warmth TENDERNESS: Tender lateral thigh over areas where hardware is present underneath STRENGTH: normal GAIT: antalgic  ASSESSMENT: Painful hardware right hip.   Plan Hardware removal right hip. Discussed in detail with patient who elects to proceed.  Dione Plover Kejon Feild, MD    08/20/2013, 11:01 PM

## 2013-08-21 ENCOUNTER — Ambulatory Visit (HOSPITAL_COMMUNITY): Payer: Medicare Other | Admitting: Anesthesiology

## 2013-08-21 ENCOUNTER — Encounter (HOSPITAL_COMMUNITY): Payer: Medicare Other | Admitting: Anesthesiology

## 2013-08-21 ENCOUNTER — Encounter (HOSPITAL_COMMUNITY)
Admission: RE | Disposition: A | Discharge status: Home / Self Care | Payer: Self-pay | Source: Ambulatory Visit | Attending: Orthopedic Surgery

## 2013-08-21 ENCOUNTER — Ambulatory Visit (HOSPITAL_COMMUNITY)
Admission: RE | Admit: 2013-08-21 | Discharge: 2013-08-21 | Disposition: A | Discharge status: Home / Self Care | Payer: Medicare Other | Source: Ambulatory Visit | Attending: Orthopedic Surgery | Admitting: Orthopedic Surgery

## 2013-08-21 ENCOUNTER — Encounter (HOSPITAL_COMMUNITY): Payer: Self-pay | Admitting: *Deleted

## 2013-08-21 DIAGNOSIS — M25559 Pain in unspecified hip: Secondary | ICD-10-CM | POA: Insufficient documentation

## 2013-08-21 DIAGNOSIS — Z85828 Personal history of other malignant neoplasm of skin: Secondary | ICD-10-CM | POA: Insufficient documentation

## 2013-08-21 DIAGNOSIS — M949 Disorder of cartilage, unspecified: Secondary | ICD-10-CM

## 2013-08-21 DIAGNOSIS — T8489XA Other specified complication of internal orthopedic prosthetic devices, implants and grafts, initial encounter: Secondary | ICD-10-CM | POA: Insufficient documentation

## 2013-08-21 DIAGNOSIS — Z9010 Acquired absence of unspecified breast and nipple: Secondary | ICD-10-CM | POA: Insufficient documentation

## 2013-08-21 DIAGNOSIS — M899 Disorder of bone, unspecified: Secondary | ICD-10-CM | POA: Insufficient documentation

## 2013-08-21 DIAGNOSIS — Z96649 Presence of unspecified artificial hip joint: Secondary | ICD-10-CM | POA: Insufficient documentation

## 2013-08-21 DIAGNOSIS — T8484XA Pain due to internal orthopedic prosthetic devices, implants and grafts, initial encounter: Secondary | ICD-10-CM

## 2013-08-21 DIAGNOSIS — Y831 Surgical operation with implant of artificial internal device as the cause of abnormal reaction of the patient, or of later complication, without mention of misadventure at the time of the procedure: Secondary | ICD-10-CM | POA: Insufficient documentation

## 2013-08-21 DIAGNOSIS — M159 Polyosteoarthritis, unspecified: Secondary | ICD-10-CM | POA: Insufficient documentation

## 2013-08-21 DIAGNOSIS — Z853 Personal history of malignant neoplasm of breast: Secondary | ICD-10-CM | POA: Insufficient documentation

## 2013-08-21 DIAGNOSIS — I10 Essential (primary) hypertension: Secondary | ICD-10-CM | POA: Insufficient documentation

## 2013-08-21 DIAGNOSIS — E039 Hypothyroidism, unspecified: Secondary | ICD-10-CM | POA: Insufficient documentation

## 2013-08-21 HISTORY — PX: HARDWARE REMOVAL: SHX979

## 2013-08-21 SURGERY — REMOVAL, HARDWARE
Anesthesia: General | Site: Hip | Laterality: Right

## 2013-08-21 MED ORDER — CEFAZOLIN SODIUM-DEXTROSE 2-3 GM-% IV SOLR
2.0000 g | INTRAVENOUS | Status: AC
  Administered 2013-08-21: 2 g via INTRAVENOUS

## 2013-08-21 MED ORDER — DEXAMETHASONE SODIUM PHOSPHATE 10 MG/ML IJ SOLN
10.0000 mg | Freq: Once | INTRAMUSCULAR | Status: AC
  Administered 2013-08-21: 10 mg via INTRAVENOUS

## 2013-08-21 MED ORDER — MIDAZOLAM HCL 5 MG/5ML IJ SOLN
INTRAMUSCULAR | Status: DC | PRN
  Administered 2013-08-21: 1 mg via INTRAVENOUS

## 2013-08-21 MED ORDER — ACETAMINOPHEN 10 MG/ML IV SOLN
1000.0000 mg | Freq: Once | INTRAVENOUS | Status: AC
Start: 2013-08-21 — End: 2013-08-21
  Administered 2013-08-21: 1000 mg via INTRAVENOUS
  Filled 2013-08-21: qty 100

## 2013-08-21 MED ORDER — EPHEDRINE SULFATE 50 MG/ML IJ SOLN
INTRAMUSCULAR | Status: DC | PRN
  Administered 2013-08-21: 10 mg via INTRAVENOUS

## 2013-08-21 MED ORDER — LIDOCAINE HCL (CARDIAC) 20 MG/ML IV SOLN
INTRAVENOUS | Status: AC
  Filled 2013-08-21: qty 5

## 2013-08-21 MED ORDER — LACTATED RINGERS IV SOLN
INTRAVENOUS | Status: DC
  Administered 2013-08-21: 16:00:00 via INTRAVENOUS
  Administered 2013-08-21: 1000 mL via INTRAVENOUS

## 2013-08-21 MED ORDER — BUPIVACAINE HCL 0.25 % IJ SOLN
INTRAMUSCULAR | Status: DC | PRN
  Administered 2013-08-21: 20 mL

## 2013-08-21 MED ORDER — CEFAZOLIN SODIUM-DEXTROSE 2-3 GM-% IV SOLR
INTRAVENOUS | Status: AC
  Filled 2013-08-21: qty 50

## 2013-08-21 MED ORDER — LIDOCAINE HCL (CARDIAC) 10 MG/ML IV SOLN
INTRAVENOUS | Status: DC | PRN
  Administered 2013-08-21: 80 mg via INTRAVENOUS

## 2013-08-21 MED ORDER — PROPOFOL 10 MG/ML IV BOLUS
INTRAVENOUS | Status: AC
  Filled 2013-08-21: qty 20

## 2013-08-21 MED ORDER — BUPIVACAINE HCL 0.25 % IJ SOLN
INTRAMUSCULAR | Status: AC
  Filled 2013-08-21: qty 1

## 2013-08-21 MED ORDER — FENTANYL CITRATE 0.05 MG/ML IJ SOLN
INTRAMUSCULAR | Status: AC
  Filled 2013-08-21: qty 5

## 2013-08-21 MED ORDER — TRAMADOL HCL 50 MG PO TABS: 50.0000 mg | ORAL_TABLET | Freq: Four times a day (QID) | ORAL | Status: DC | PRN

## 2013-08-21 MED ORDER — SODIUM CHLORIDE 0.9 % IV SOLN: INTRAVENOUS | Status: DC

## 2013-08-21 MED ORDER — ONDANSETRON HCL 4 MG/2ML IJ SOLN
INTRAMUSCULAR | Status: AC
Start: 2013-08-21 — End: 2013-08-21
  Filled 2013-08-21: qty 2

## 2013-08-21 MED ORDER — MIDAZOLAM HCL 2 MG/2ML IJ SOLN
INTRAMUSCULAR | Status: AC
  Filled 2013-08-21: qty 2

## 2013-08-21 MED ORDER — DEXAMETHASONE SODIUM PHOSPHATE 10 MG/ML IJ SOLN
INTRAMUSCULAR | Status: AC
  Filled 2013-08-21: qty 1

## 2013-08-21 MED ORDER — 0.9 % SODIUM CHLORIDE (POUR BTL) OPTIME
TOPICAL | Status: DC | PRN
  Administered 2013-08-21: 1000 mL

## 2013-08-21 MED ORDER — FENTANYL CITRATE 0.05 MG/ML IJ SOLN
INTRAMUSCULAR | Status: DC | PRN
  Administered 2013-08-21 (×2): 50 ug via INTRAVENOUS

## 2013-08-21 MED ORDER — PROPOFOL 10 MG/ML IV BOLUS
INTRAVENOUS | Status: DC | PRN
  Administered 2013-08-21: 140 mg via INTRAVENOUS

## 2013-08-21 MED ORDER — ONDANSETRON HCL 4 MG/2ML IJ SOLN
INTRAMUSCULAR | Status: DC | PRN
  Administered 2013-08-21: 4 mg via INTRAVENOUS

## 2013-08-21 SURGICAL SUPPLY — 46 items
BANDAGE ELASTIC 6 VELCRO ST LF (GAUZE/BANDAGES/DRESSINGS) IMPLANT
BANDAGE ESMARK 6X9 LF (GAUZE/BANDAGES/DRESSINGS) IMPLANT
BNDG ESMARK 6X9 LF (GAUZE/BANDAGES/DRESSINGS)
CLOSURE WOUND 1/2 X4 (GAUZE/BANDAGES/DRESSINGS) ×1
CUFF TOURN SGL QUICK 18 (TOURNIQUET CUFF) IMPLANT
CUFF TOURN SGL QUICK 34 (TOURNIQUET CUFF)
CUFF TRNQT CYL 34X4X40X1 (TOURNIQUET CUFF) IMPLANT
DRAPE C-ARM 42X120 X-RAY (DRAPES) IMPLANT
DRAPE C-ARMOR (DRAPES) IMPLANT
DRAPE EXTREMITY T 121X128X90 (DRAPE) IMPLANT
DRAPE INCISE IOBAN 66X45 STRL (DRAPES) ×3 IMPLANT
DRAPE ORTHO SPLIT 77X108 STRL (DRAPES) ×4
DRAPE SURG ORHT 6 SPLT 77X108 (DRAPES) ×2 IMPLANT
DRSG ADAPTIC 3X8 NADH LF (GAUZE/BANDAGES/DRESSINGS) IMPLANT
DRSG MEPILEX BORDER 4X4 (GAUZE/BANDAGES/DRESSINGS) ×3 IMPLANT
DRSG PAD ABDOMINAL 8X10 ST (GAUZE/BANDAGES/DRESSINGS) ×3 IMPLANT
DURAPREP 26ML APPLICATOR (WOUND CARE) ×3 IMPLANT
ELECT REM PT RETURN 9FT ADLT (ELECTROSURGICAL) ×3
ELECTRODE REM PT RTRN 9FT ADLT (ELECTROSURGICAL) ×1 IMPLANT
GLOVE BIO SURGEON STRL SZ7.5 (GLOVE) IMPLANT
GLOVE BIO SURGEON STRL SZ8 (GLOVE) ×3 IMPLANT
GLOVE BIOGEL PI IND STRL 7.0 (GLOVE) ×1 IMPLANT
GLOVE BIOGEL PI IND STRL 8 (GLOVE) ×1 IMPLANT
GLOVE BIOGEL PI INDICATOR 7.0 (GLOVE) ×2
GLOVE BIOGEL PI INDICATOR 8 (GLOVE) ×2
GLOVE SURG SS PI 6.5 STRL IVOR (GLOVE) ×3 IMPLANT
GOWN STRL REUS W/TWL LRG LVL3 (GOWN DISPOSABLE) ×3 IMPLANT
GOWN STRL REUS W/TWL XL LVL3 (GOWN DISPOSABLE) ×3 IMPLANT
KIT BASIN OR (CUSTOM PROCEDURE TRAY) ×3 IMPLANT
MANIFOLD NEPTUNE II (INSTRUMENTS) ×3 IMPLANT
NEEDLE HYPO 22GX1.5 SAFETY (NEEDLE) ×3 IMPLANT
NS IRRIG 1000ML POUR BTL (IV SOLUTION) ×3 IMPLANT
PACK TOTAL JOINT (CUSTOM PROCEDURE TRAY) ×3 IMPLANT
PADDING CAST COTTON 6X4 STRL (CAST SUPPLIES) IMPLANT
POSITIONER SURGICAL ARM (MISCELLANEOUS) ×3 IMPLANT
SPONGE GAUZE 4X4 12PLY (GAUZE/BANDAGES/DRESSINGS) IMPLANT
STAPLER VISISTAT 35W (STAPLE) IMPLANT
STRIP CLOSURE SKIN 1/2X4 (GAUZE/BANDAGES/DRESSINGS) ×2 IMPLANT
SUT MNCRL AB 4-0 PS2 18 (SUTURE) ×3 IMPLANT
SUT VIC AB 0 CT1 36 (SUTURE) ×6 IMPLANT
SUT VIC AB 2-0 CT1 27 (SUTURE) ×4
SUT VIC AB 2-0 CT1 TAPERPNT 27 (SUTURE) ×2 IMPLANT
SYR CONTROL 10ML LL (SYRINGE) ×3 IMPLANT
TOWEL OR 17X26 10 PK STRL BLUE (TOWEL DISPOSABLE) ×6 IMPLANT
UNDERPAD 30X30 INCONTINENT (UNDERPADS AND DIAPERS) IMPLANT
WATER STERILE IRR 1500ML POUR (IV SOLUTION) IMPLANT

## 2013-08-21 NOTE — Interval H&P Note (Signed)
History and Physical Interval Note:  08/21/2013 2:06 PM  [name]  has presented today for surgery, with the diagnosis of PAINFUL HARDWARE OF RIGHT HIP  The various methods of treatment have been discussed with the patient and family. After consideration of risks, benefits and other options for treatment, the patient has consented to  Procedure(s): RIGHT HIP HARDWARE REMOVAL (Right) as a surgical intervention .  The patient's history has been reviewed, patient examined, no change in status, stable for surgery.  I have reviewed the patient's chart and labs.  Questions were answered to the patient's satisfaction.     Dione Plover Mliss Wedin

## 2013-08-21 NOTE — Brief Op Note (Signed)
08/21/2013  4:01 PM  PATIENT:  [name]  80 y.o. female  PRE-OPERATIVE DIAGNOSIS:  PAINFUL HARDWARE OF RIGHT HIP  POST-OPERATIVE DIAGNOSIS:  painful hardware of right hip  PROCEDURE:  Procedure(s): RIGHT HIP HARDWARE REMOVAL (Right)  SURGEON:  Surgeon(s) and Role:    * Gearlean Alf, MD - Primary  PHYSICIAN ASSISTANT:   ASSISTANTS: None   ANESTHESIA:   general  EBL:  Total I/O In: -  Out: 50 [Blood:50]  BLOOD ADMINISTERED:none  DRAINS: none   LOCAL MEDICATIONS USED:  MARCAINE     SPECIMEN:  No Specimen  DISPOSITION OF SPECIMEN:  N/A  COUNTS:  YES  TOURNIQUET:  * No tourniquets in log *  DICTATION: .Other Dictation: Dictation Number [id number]  PLAN OF CARE: Discharge to home after PACU  PATIENT DISPOSITION:  PACU - hemodynamically stable.

## 2013-08-21 NOTE — Transfer of Care (Signed)
Immediate Anesthesia Transfer of Care Note  Patient: [name]  Procedure(s) Performed: Procedure(s): RIGHT HIP HARDWARE REMOVAL (Right)  Patient Location: PACU  Anesthesia Type:General  Level of Consciousness: awake, alert , oriented and patient cooperative  Airway & Oxygen Therapy: Patient Spontanous Breathing and Patient connected to face mask oxygen  Post-op Assessment: Report given to PACU RN, Post -op Vital signs reviewed and stable and Patient moving all extremities X 4  Post vital signs: stable  Complications: No apparent anesthesia complications

## 2013-08-21 NOTE — Anesthesia Postprocedure Evaluation (Signed)
  Anesthesia Post-op Note  Patient: [name]  Procedure(s) Performed: Procedure(s) (LRB): RIGHT HIP HARDWARE REMOVAL (Right)  Patient Location: PACU  Anesthesia Type: General  Level of Consciousness: awake and alert   Airway and Oxygen Therapy: Patient Spontanous Breathing  Post-op Pain: mild  Post-op Assessment: Post-op Vital signs reviewed, Patient's Cardiovascular Status Stable, Respiratory Function Stable, Patent Airway and No signs of Nausea or vomiting  Last Vitals:  Filed Vitals:   08/21/13 1600  BP: 170/65  Pulse: 51  Temp: 36.6 C  Resp: 12    Post-op Vital Signs: stable   Complications: No apparent anesthesia complications

## 2013-08-21 NOTE — Anesthesia Preprocedure Evaluation (Signed)
Anesthesia Evaluation  Patient identified by MRN, date of birth, ID band Patient awake    Reviewed: Allergy & Precautions, H&P , NPO status , Patient's Chart, lab work & pertinent test results  Airway Mallampati: II TM Distance: >3 FB Neck ROM: Full    Dental no notable dental hx.    Pulmonary neg pulmonary ROS, former smoker,  breath sounds clear to auscultation  Pulmonary exam normal       Cardiovascular hypertension, Pt. on medications Rhythm:Regular Rate:Normal     Neuro/Psych negative neurological ROS  negative psych ROS   GI/Hepatic negative GI ROS, Neg liver ROS,   Endo/Other  Hypothyroidism   Renal/GU negative Renal ROS  negative genitourinary   Musculoskeletal negative musculoskeletal ROS (+)   Abdominal   Peds negative pediatric ROS (+)  Hematology negative hematology ROS (+)   Anesthesia Other Findings   Reproductive/Obstetrics negative OB ROS                           Anesthesia Physical Anesthesia Plan  ASA: II  Anesthesia Plan: General   Post-op Pain Management:    Induction: Intravenous  Airway Management Planned: LMA  Additional Equipment:   Intra-op Plan:   Post-operative Plan:   Informed Consent: I have reviewed the patients History and Physical, chart, labs and discussed the procedure including the risks, benefits and alternatives for the proposed anesthesia with the patient or authorized representative who has indicated his/her understanding and acceptance.   Dental advisory given  Plan Discussed with: CRNA and Surgeon  Anesthesia Plan Comments:         Anesthesia Quick Evaluation

## 2013-08-22 ENCOUNTER — Encounter (HOSPITAL_COMMUNITY): Payer: Self-pay | Admitting: Orthopedic Surgery

## 2013-08-22 NOTE — Op Note (Signed)
NAMEDAMIA, BOBROWSKI NO.:  [id number]  MEDICAL RECORD NO.:  [id number]  LOCATION:  [facility]                         FACILITY:  [facility]  PHYSICIAN:  Gaynelle Arabian, M.D.    DATE OF BIRTH:  03-20-1932  DATE OF PROCEDURE:  08/21/2013 DATE OF DISCHARGE:  08/21/2013                              OPERATIVE REPORT   PREOPERATIVE DIAGNOSIS:  Painful hardware, right hip.  POSTOPERATIVE DIAGNOSIS:  Painful hardware, right hip.  PROCEDURE:  Hardware removal, right hip.  SURGEON:  Gaynelle Arabian, M.D.  ASSISTANT:  No assistant.  ANESTHESIA:  General.  ESTIMATED BLOOD LOSS:  Minimal.  DRAINS:  None.  COMPLICATIONS:  None.  CONDITION:  Stable to recovery.  BRIEF CLINICAL NOTE:  Iyanah is an 80 year old female, complex history in regard to her right hip.  She has had multiple procedures done.  She still has retained cerclage cables from her previous open reduction and internal fixation of fracture.  The fracture is healed.  She has had revision surgery.  The cables are causing irritation and pain.  She presents now for removal of the cables.  PROCEDURE IN DETAIL:  After successful administration of general anesthetic, the patient was placed in left lateral decubitus position with the right side up, and held with a hip positioner.  Right lower extremity was isolated from perineum with plastic drapes, and prepped and draped in the usual sterile fashion.  I was able to palpate where the cables were and I made about 1 inch incision over this area.  The skin was cut with a 10 blade through subcutaneous tissue to the fascia lata, which incised in line with the skin incision.  The fascia of the vastus lateralis was incised and the cables were identified.  I cut the cables and removed them.  There was a small bursa around them with some fluid, thus indicating that they were causing irritation.  We copiously irrigated with saline solution and then, closed the fascia with  an interrupted 0 Vicryl.  Subcu was closed with interrupted 2-0 Vicryl.  A 20 mL of 0.25% Marcaine were injected into the subcu tissues.  The subcuticular was closed with running 4-0 Monocryl.  Incision was cleaned and dried, Steri-Strips and a bulky sterile dressing applied.  She was then awakened and transported to recovery in stable condition.     Gaynelle Arabian, M.D.     FA/MEDQ  D:  08/21/2013  T:  08/22/2013  Job:  [id number]

## 2014-03-10 ENCOUNTER — Emergency Department (HOSPITAL_COMMUNITY): Payer: Medicare Other

## 2014-03-10 ENCOUNTER — Observation Stay (HOSPITAL_COMMUNITY)
Admission: EM | Admit: 2014-03-10 | Discharge: 2014-03-11 | Disposition: A | Discharge status: Home / Self Care | Payer: Medicare Other | Attending: Internal Medicine | Admitting: Internal Medicine

## 2014-03-10 ENCOUNTER — Encounter (HOSPITAL_COMMUNITY): Payer: Self-pay | Admitting: *Deleted

## 2014-03-10 DIAGNOSIS — Z791 Long term (current) use of non-steroidal anti-inflammatories (NSAID): Secondary | ICD-10-CM | POA: Diagnosis not present

## 2014-03-10 DIAGNOSIS — E785 Hyperlipidemia, unspecified: Secondary | ICD-10-CM | POA: Insufficient documentation

## 2014-03-10 DIAGNOSIS — Z79899 Other long term (current) drug therapy: Secondary | ICD-10-CM | POA: Insufficient documentation

## 2014-03-10 DIAGNOSIS — F10929 Alcohol use, unspecified with intoxication, unspecified: Secondary | ICD-10-CM | POA: Diagnosis present

## 2014-03-10 DIAGNOSIS — Y900 Blood alcohol level of less than 20 mg/100 ml: Secondary | ICD-10-CM | POA: Diagnosis not present

## 2014-03-10 DIAGNOSIS — I639 Cerebral infarction, unspecified: Principal | ICD-10-CM

## 2014-03-10 DIAGNOSIS — I10 Essential (primary) hypertension: Secondary | ICD-10-CM | POA: Diagnosis not present

## 2014-03-10 DIAGNOSIS — R4181 Age-related cognitive decline: Secondary | ICD-10-CM | POA: Diagnosis not present

## 2014-03-10 DIAGNOSIS — Z7982 Long term (current) use of aspirin: Secondary | ICD-10-CM | POA: Diagnosis not present

## 2014-03-10 DIAGNOSIS — R002 Palpitations: Secondary | ICD-10-CM

## 2014-03-10 DIAGNOSIS — F10129 Alcohol abuse with intoxication, unspecified: Secondary | ICD-10-CM | POA: Insufficient documentation

## 2014-03-10 DIAGNOSIS — E039 Hypothyroidism, unspecified: Secondary | ICD-10-CM | POA: Diagnosis not present

## 2014-03-10 DIAGNOSIS — R531 Weakness: Secondary | ICD-10-CM | POA: Diagnosis present

## 2014-03-10 DIAGNOSIS — Z87891 Personal history of nicotine dependence: Secondary | ICD-10-CM | POA: Insufficient documentation

## 2014-03-10 HISTORY — DX: Cerebral infarction, unspecified: I63.9

## 2014-03-10 LAB — I-STAT CHEM 8, ED
BUN: 10 mg/dL (ref 6–23)
Calcium, Ion: 1.2 mmol/L (ref 1.13–1.30)
Chloride: 102 meq/L (ref 96–112)
Creatinine, Ser: 0.6 mg/dL (ref 0.50–1.10)
Glucose, Bld: 103 mg/dL — ABNORMAL HIGH (ref 70–99)
HCT: 43 % (ref 36.0–46.0)
Hemoglobin: 14.6 g/dL (ref 12.0–15.0)
Potassium: 4.1 meq/L (ref 3.7–5.3)
Sodium: 140 meq/L (ref 137–147)
TCO2: 29 mmol/L (ref 0–100)

## 2014-03-10 LAB — PROTIME-INR
INR: 1.01 (ref 0.00–1.49)
Prothrombin Time: 13.4 seconds (ref 11.6–15.2)

## 2014-03-10 LAB — RAPID URINE DRUG SCREEN, HOSP PERFORMED
Amphetamines: NOT DETECTED
BARBITURATES: NOT DETECTED
Benzodiazepines: NOT DETECTED
Cocaine: NOT DETECTED
OPIATES: NOT DETECTED
TETRAHYDROCANNABINOL: NOT DETECTED

## 2014-03-10 LAB — COMPREHENSIVE METABOLIC PANEL WITH GFR
ALT: 14 U/L (ref 0–35)
AST: 19 U/L (ref 0–37)
Albumin: 4.1 g/dL (ref 3.5–5.2)
Alkaline Phosphatase: 78 U/L (ref 39–117)
Anion gap: 10 (ref 5–15)
BUN: 11 mg/dL (ref 6–23)
CO2: 29 meq/L (ref 19–32)
Calcium: 9.5 mg/dL (ref 8.4–10.5)
Chloride: 103 meq/L (ref 96–112)
Creatinine, Ser: 0.6 mg/dL (ref 0.50–1.10)
GFR calc Af Amer: >90 mL/min
GFR calc non Af Amer: 84 mL/min — ABNORMAL LOW
Glucose, Bld: 106 mg/dL — ABNORMAL HIGH (ref 70–99)
Potassium: 4.3 meq/L (ref 3.7–5.3)
Sodium: 142 meq/L (ref 137–147)
Total Bilirubin: 0.3 mg/dL (ref 0.3–1.2)
Total Protein: 7.4 g/dL (ref 6.0–8.3)

## 2014-03-10 LAB — APTT: aPTT: 29 seconds (ref 24–37)

## 2014-03-10 LAB — URINALYSIS, ROUTINE W REFLEX MICROSCOPIC
Bilirubin Urine: NEGATIVE
Glucose, UA: NEGATIVE mg/dL
Hgb urine dipstick: NEGATIVE
Ketones, ur: NEGATIVE mg/dL
Leukocytes, UA: NEGATIVE
Nitrite: NEGATIVE
Protein, ur: NEGATIVE mg/dL
Specific Gravity, Urine: 1.006 (ref 1.005–1.030)
Urobilinogen, UA: 0.2 mg/dL (ref 0.0–1.0)
pH: 7.5 (ref 5.0–8.0)

## 2014-03-10 LAB — CBC
HCT: 39.2 % (ref 36.0–46.0)
Hemoglobin: 12.7 g/dL (ref 12.0–15.0)
MCH: 29.2 pg (ref 26.0–34.0)
MCHC: 32.4 g/dL (ref 30.0–36.0)
MCV: 90.1 fL (ref 78.0–100.0)
Platelets: 234 K/uL (ref 150–400)
RBC: 4.35 MIL/uL (ref 3.87–5.11)
RDW: 13.9 % (ref 11.5–15.5)
WBC: 6 K/uL (ref 4.0–10.5)

## 2014-03-10 LAB — DIFFERENTIAL
BASOS ABS: 0.1 10*3/uL (ref 0.0–0.1)
Basophils Relative: 1 % (ref 0–1)
Eosinophils Absolute: 0.1 10*3/uL (ref 0.0–0.7)
Eosinophils Relative: 2 % (ref 0–5)
LYMPHS PCT: 24 % (ref 12–46)
Lymphs Abs: 1.5 10*3/uL (ref 0.7–4.0)
MONO ABS: 0.8 10*3/uL (ref 0.1–1.0)
MONOS PCT: 13 % — AB (ref 3–12)
Neutro Abs: 3.7 10*3/uL (ref 1.7–7.7)
Neutrophils Relative %: 60 % (ref 43–77)

## 2014-03-10 LAB — ETHANOL: Alcohol, Ethyl (B): 13 mg/dL — ABNORMAL HIGH (ref 0–11)

## 2014-03-10 LAB — I-STAT TROPONIN, ED: Troponin i, poc: 0 ng/mL (ref 0.00–0.08)

## 2014-03-10 MED ORDER — AMLODIPINE BESYLATE 5 MG PO TABS
5.0000 mg | ORAL_TABLET | Freq: Every morning | ORAL | Status: DC
  Administered 2014-03-11: 5 mg via ORAL
  Filled 2014-03-10: qty 1

## 2014-03-10 MED ORDER — STROKE: EARLY STAGES OF RECOVERY BOOK
Freq: Once | Status: AC
  Administered 2014-03-10: 22:00:00
  Filled 2014-03-10: qty 1

## 2014-03-10 MED ORDER — ACETAMINOPHEN 650 MG RE SUPP: 650.0000 mg | RECTAL | Status: DC | PRN

## 2014-03-10 MED ORDER — LEVOTHYROXINE SODIUM 25 MCG PO TABS
125.0000 ug | ORAL_TABLET | Freq: Every day | ORAL | Status: DC
  Administered 2014-03-11: 125 ug via ORAL
  Filled 2014-03-10 (×3): qty 1

## 2014-03-10 MED ORDER — ASPIRIN EC 81 MG PO TBEC
81.0000 mg | DELAYED_RELEASE_TABLET | Freq: Every day | ORAL | Status: DC
  Administered 2014-03-10: 81 mg via ORAL
  Filled 2014-03-10 (×2): qty 1

## 2014-03-10 MED ORDER — SENNOSIDES-DOCUSATE SODIUM 8.6-50 MG PO TABS
1.0000 | ORAL_TABLET | Freq: Every evening | ORAL | Status: DC | PRN
Start: 2014-03-10 — End: 2014-03-11

## 2014-03-10 MED ORDER — POLYVINYL ALCOHOL 1.4 % OP SOLN
1.0000 [drp] | Freq: Every day | OPHTHALMIC | Status: DC | PRN
  Filled 2014-03-10 (×2): qty 15

## 2014-03-10 MED ORDER — LORAZEPAM 2 MG/ML IJ SOLN: 1.0000 mg | Freq: Four times a day (QID) | INTRAMUSCULAR | Status: DC | PRN

## 2014-03-10 MED ORDER — HYDRALAZINE HCL 20 MG/ML IJ SOLN: 2.0000 mg | Freq: Four times a day (QID) | INTRAMUSCULAR | Status: DC | PRN

## 2014-03-10 MED ORDER — ACETAMINOPHEN 325 MG PO TABS: 650.0000 mg | ORAL_TABLET | ORAL | Status: DC | PRN

## 2014-03-10 MED ORDER — LORAZEPAM 1 MG PO TABS: 1.0000 mg | ORAL_TABLET | Freq: Four times a day (QID) | ORAL | Status: DC | PRN

## 2014-03-10 MED ORDER — METHYLCELLULOSE 1 % OP SOLN: 1.0000 [drp] | Freq: Every day | OPHTHALMIC | Status: DC | PRN

## 2014-03-10 NOTE — ED Notes (Signed)
Patient back from MRI Patient remains in NAD

## 2014-03-10 NOTE — ED Notes (Signed)
EDP aware of imaging results

## 2014-03-10 NOTE — ED Provider Notes (Signed)
CSN: [id number]     Arrival date & time 03/10/14  1104 History   First MD Initiated Contact with Patient 03/10/14 1111     Chief Complaint  Patient presents with  . hand weakness, confused speech      (Consider location/radiation/quality/duration/timing/severity/associated sxs/prior Treatment) The history is provided by the patient.  patient was sent in by her primary care doctor for possible stroke. She has had some difficulty using her hands for the last couple days. She plays violin and reportedly had no difficulty playing Saturday evening. She states Sunday morning she was able play but they have been with more effort. She states last night she was not able play well. She also has had some difficulty typing and some difficulty writing. She may have had a little difficulty speaking also. She's had an occasional dull right-sided headache. No chest pain. She states that late Friday night/Saturday morning she woke up she does feel her heart pounding and beating quickly. No chest pain. No fevers. She states her blood pressures been high. She states she went to her primary care doctor who told her to come to the ER. She was not willing to go to [facility] and would onlycome by car to the hospital  Past Medical History  Diagnosis Date  . Hypothyroidism 3-21- 13    tx. levothyroxine  . Blood transfusion feb 2014    post surgery some time ago  . Arthritis 07-27-11    osteoarthritis-hips/s/p bil. THA, arthritis right hand   . Hypertension   . Anemia     hx of with surgery   . Hard of hearing     both ears mild loss  . Complication of anesthesia     epinephrine - screams uncontrollably / pt does not want general anesthesia or narcotics  . Cold hands   . Osteopenia   . Constipation   . Cancer 1989    Breast cancer -s/p lt. mastectomy, some squamous cell lesions  . Fuchs' corneal dystrophy     both eyes   Past Surgical History  Procedure Laterality Date  . Ankle surgery  2005     right ankle tendon repair  . Cataract extraction, bilateral  few yrs ago    Bilateral  . Squamous cell carcinoma excision  07-27-11    x2 left shoulder  . Orif femur fracture  07/31/2011    Procedure: OPEN REDUCTION INTERNAL FIXATION (ORIF) DISTAL FEMUR FRACTURE;  Surgeon: Gearlean Alf, MD;  Location: [facility];  Service: Orthopedics;  Laterality: Right;  right femur  (c-arm)   . Hardware removal  04/05/2012    Procedure: HARDWARE REMOVAL;  Surgeon: Gearlean Alf, MD;  Location: [facility];  Service: Orthopedics;  Laterality: Right;  Removal of Hardware Right Femur   . Total hip revision  04/10/2012    Procedure: TOTAL HIP REVISION;  Surgeon: Gearlean Alf, MD;  Location: [facility];  Service: Orthopedics;  Laterality: Right;  . Breast surgery  07-1987    lt.breast cancer intraductal cancer, mastectomy  . Joint replacement  07-27-11    Bilateral: Right THA - 04/2000, Left THA 06/2001  . Left hip replacement Left feb 2003  . Total hip revision Left 06/27/2012    Procedure: LEFT TOTAL HIP REVISION;  Surgeon: Gearlean Alf, MD;  Location: [facility];  Service: Orthopedics;  Laterality: Left;  Marland Kitchen Mastectomy  1989    left - Pt states has no restrictions on use of L arm for BP or blood  draw  . Hardware removal Left 06/30/2013    Procedure: LEFT HIP HARDWARE REMOVAL OF CABLES;  Surgeon: Gearlean Alf, MD;  Location: [facility];  Service: Orthopedics;  Laterality: Left;  Marland Kitchen Melanoma removed Left     x 1  . Basal cell removed from face      3-4 times  . Abdominal hysterectomy      ovaries removed  . Hardware removal Right 08/21/2013    Procedure: RIGHT HIP HARDWARE REMOVAL;  Surgeon: Gearlean Alf, MD;  Location: [facility];  Service: Orthopedics;  Laterality: Right;   Family History  Problem Relation Age of Onset  . Hodgkin's lymphoma Mother   . Celiac disease Mother   . Melanoma Father   . Heart disease Paternal Grandmother   . Heart disease Paternal Grandfather    History  Substance Use Topics  .  Smoking status: Former Smoker -- 1.00 packs/day for 10 years    Types: Cigarettes    Quit date: 05/09/1963  . Smokeless tobacco: Never Used  . Alcohol Use: 0.6 oz/week    1 Glasses of wine per week     Comment: wine daily with supper or beer   OB History    No data available     Review of Systems  Constitutional: Negative for activity change and appetite change.  Eyes: Negative for pain and visual disturbance.  Respiratory: Negative for chest tightness and shortness of breath.   Cardiovascular: Negative for chest pain and leg swelling.  Gastrointestinal: Negative for nausea, vomiting, abdominal pain and diarrhea.  Genitourinary: Negative for flank pain.       Some urinary incontinence  Musculoskeletal: Negative for back pain and neck stiffness.  Skin: Negative for rash.  Neurological: Positive for speech difficulty and headaches. Negative for dizziness, tremors and numbness.  Psychiatric/Behavioral: Negative for behavioral problems.      Allergies  Epinephrine  Home Medications   Prior to Admission medications   Medication Sig Start Date End Date Taking? Authorizing Provider  amLODipine (NORVASC) 5 MG tablet Take 5 mg by mouth every morning.    Yes Historical Provider, MD  aspirin EC 81 MG tablet Take 81 mg by mouth at bedtime.   Yes Historical Provider, MD  CALCIUM-VITAMIN D PO Take 1 tablet by mouth daily.   Yes Historical Provider, MD  Hyaluronic Acid-Vitamin C (HYALURONIC ACID PO) Take 1 tablet by mouth daily.   Yes Historical Provider, MD  levothyroxine (SYNTHROID, LEVOTHROID) 125 MCG tablet Take 125 mcg by mouth daily before breakfast.   Yes Historical Provider, MD  methylcellulose (ARTIFICIAL TEARS) 1 % ophthalmic solution Place 1 drop into both eyes daily as needed (for dry eyes).    Yes Historical Provider, MD  Multiple Vitamins-Iron (MULTIVITAMINS WITH IRON) TABS tablet Take 1 tablet by mouth daily.    Yes Historical Provider, MD  Omega-3 Fatty Acids (FISH OIL)  1200 MG CAPS Take 1,200 mg by mouth 2 (two) times daily. Pt takes one at noon and one at night.   Yes Historical Provider, MD  OVER THE COUNTER MEDICATION Take 1 tablet by mouth every other day. Pt takes sunflower levithin supplement.  (1200 mg tablet)   Yes Historical Provider, MD  Vitamin D, Cholecalciferol, 1000 UNITS TABS Take 1 tablet by mouth daily with supper.   Yes Historical Provider, MD  naproxen sodium (ANAPROX) 220 MG tablet Take 220 mg by mouth daily as needed (for pain).     Historical Provider, MD  traMADol (ULTRAM) 50 MG tablet  Take 1-2 tablets (50-100 mg total) by mouth every 6 (six) hours as needed. Patient not taking: Reported on 03/10/2014 08/21/13   Gearlean Alf, MD   BP 159/73 mmHg  Pulse 59  Temp(Src) 97.3 F (36.3 C) (Oral)  Resp 14  SpO2 99% Physical Exam  Constitutional: She is oriented to person, place, and time. She appears well-developed and well-nourished.  HENT:  Head: Normocephalic and atraumatic.  Eyes: EOM are normal. Pupils are equal, round, and reactive to light.  Neck: Normal range of motion. Neck supple.  Cardiovascular: Normal rate, regular rhythm and normal heart sounds.   No murmur heard. Pulmonary/Chest: Effort normal and breath sounds normal. No respiratory distress. She has no wheezes. She has no rales.  Abdominal: Soft. Bowel sounds are normal. She exhibits no distension. There is no tenderness. There is no rebound and no guarding.  Musculoskeletal: Normal range of motion.  Neurological: She is alert and oriented to person, place, and time. No cranial nerve deficit.  Method of seen visual fields grossly intact by confrontation. Pupils equal round reactive. Face symmetric. Finger-nose intact bilaterally. Radial median and ulnar nerve intact on hands. No Romberg. Normal two-step gait. Goodstrength in bilateral upper and lower extremities.  Skin: Skin is warm and dry.  Psychiatric: She has a normal mood and affect. Her speech is normal.  Nursing  note and vitals reviewed.   ED Course  Procedures (including critical care time) Labs Review Labs Reviewed  ETHANOL - Abnormal; Notable for the following:    Alcohol, Ethyl (B) 13 (*)    All other components within normal limits  DIFFERENTIAL - Abnormal; Notable for the following:    Monocytes Relative 13 (*)    All other components within normal limits  COMPREHENSIVE METABOLIC PANEL - Abnormal; Notable for the following:    Glucose, Bld 106 (*)    GFR calc non Af Amer 84 (*)    All other components within normal limits  URINALYSIS, ROUTINE W REFLEX MICROSCOPIC - Abnormal; Notable for the following:    Color, Urine COLORLESS (*)    All other components within normal limits  I-STAT CHEM 8, ED - Abnormal; Notable for the following:    Glucose, Bld 103 (*)    All other components within normal limits  PROTIME-INR  APTT  CBC  URINE RAPID DRUG SCREEN (HOSP PERFORMED)  I-STAT TROPOININ, ED  Randolm Idol, ED    Imaging Review Dg Chest 2 View  03/10/2014   CLINICAL DATA:  Palpitations.  EXAM: CHEST  2 VIEW  COMPARISON:  June 24, 2013.  FINDINGS: The heart size and mediastinal contours are within normal limits. Both lungs are clear. No pneumothorax or pleural effusion is noted. The visualized skeletal structures are unremarkable.  IMPRESSION: No acute cardiopulmonary abnormality seen.   Electronically Signed   By: Sabino Dick M.D.   On: 03/10/2014 12:27   Ct Head Wo Contrast  03/10/2014   CLINICAL DATA:  Hypertension. Difficulty with speech and difficulty using the hands for fine movements.  EXAM: CT HEAD WITHOUT CONTRAST  TECHNIQUE: Contiguous axial images were obtained from the base of the skull through the vertex without intravenous contrast.  COMPARISON:  None.  FINDINGS: Subtle white matter hypodensity in the left periventricular white matter, images 16 through 17 of series 2, mildly asymmetric compared to the contralateral side. Suspected small remote lacunar infarct along  the upper margin of the left lentiform nucleus.  Otherwise, the brainstem, cerebellum, cerebral peduncles, thalamus, basal ganglia, basilar cisterns, and  ventricular system appear within normal limits. No intracranial hemorrhage or mass lesion observed.  IMPRESSION: 1. Slightly asymmetric left periventricular white matter hypodensity may well simply be a manifestation of chronic ischemic microvascular white matter disease. Subacute infarct is statistically less likely but not entirely excluded. MRI of the brain could differentiate, if clinically warranted. 2. Remote tiny lacunar infarct along the upper margin of the left lentiform nucleus.   Electronically Signed   By: Sherryl Barters M.D.   On: 03/10/2014 12:25   Mr Jodene Nam Head Wo Contrast  03/10/2014   CLINICAL DATA:  Sudden onset of dizziness, weakness, and slurred speech. Initial encounter.  EXAM: MRI HEAD WITHOUT CONTRAST  MRA HEAD WITHOUT CONTRAST  TECHNIQUE: Multiplanar, multiecho pulse sequences of the brain and surrounding structures were obtained without intravenous contrast. Angiographic images of the head were obtained using MRA technique without contrast.  COMPARISON:  CT head earlier today.  FINDINGS: MRI HEAD FINDINGS  Scattered foci of restricted diffusion in the LEFT periventricular white matter/ centrum semiovale, also involving the posterior limb internal capsule and posterior basal ganglia, consistent with an acute LEFT MCA lenticulostriate infarct.  No visible hemorrhage, mass lesion, hydrocephalus, or extra-axial fluid.  Normal for age cerebral volume. Mild subcortical and periventricular T2 and FLAIR hyperintensities, likely chronic microvascular ischemic change. Pituitary, pineal, and cerebellar tonsils unremarkable. No upper cervical lesions. Flow voids are maintained throughout the carotid, basilar, and vertebral arteries. There are no areas of chronic hemorrhage. Negative orbits. No sinus or mastoid disease. No osseous lesions. Scalp soft  tissues unremarkable.  MRA HEAD FINDINGS  The internal carotid arteries are widely patent. The basilar artery displays a focal area of stenosis just inferior to the LEFT AICA origin, estimated 50%. Both vertebrals contribute to basilar formation, LEFT dominant. Proximal basilar fenestration is a normal variant.  There is no proximal stenosis of the anterior, middle, or posterior cerebral arteries. No cerebellar branch occlusion. No intracranial aneurysm.  IMPRESSION: Acute nonhemorrhagic LEFT MCA lenticulostriate territory infarct affecting deep white matter, posterior basal ganglia, and internal capsule.  Mild subcortical and periventricular T2 and FLAIR hyperintensities, likely chronic microvascular ischemic change.  50% mid basilar stenosis does not correlate with the observed acute pattern of ischemia, otherwise unremarkable MRA intracranial circulation.   Electronically Signed   By: Rolla Flatten M.D.   On: 03/10/2014 14:23   Mr Brain Wo Contrast  03/10/2014   CLINICAL DATA:  Sudden onset of dizziness, weakness, and slurred speech. Initial encounter.  EXAM: MRI HEAD WITHOUT CONTRAST  MRA HEAD WITHOUT CONTRAST  TECHNIQUE: Multiplanar, multiecho pulse sequences of the brain and surrounding structures were obtained without intravenous contrast. Angiographic images of the head were obtained using MRA technique without contrast.  COMPARISON:  CT head earlier today.  FINDINGS: MRI HEAD FINDINGS  Scattered foci of restricted diffusion in the LEFT periventricular white matter/ centrum semiovale, also involving the posterior limb internal capsule and posterior basal ganglia, consistent with an acute LEFT MCA lenticulostriate infarct.  No visible hemorrhage, mass lesion, hydrocephalus, or extra-axial fluid.  Normal for age cerebral volume. Mild subcortical and periventricular T2 and FLAIR hyperintensities, likely chronic microvascular ischemic change. Pituitary, pineal, and cerebellar tonsils unremarkable. No upper  cervical lesions. Flow voids are maintained throughout the carotid, basilar, and vertebral arteries. There are no areas of chronic hemorrhage. Negative orbits. No sinus or mastoid disease. No osseous lesions. Scalp soft tissues unremarkable.  MRA HEAD FINDINGS  The internal carotid arteries are widely patent. The basilar artery displays a focal area  of stenosis just inferior to the LEFT AICA origin, estimated 50%. Both vertebrals contribute to basilar formation, LEFT dominant. Proximal basilar fenestration is a normal variant.  There is no proximal stenosis of the anterior, middle, or posterior cerebral arteries. No cerebellar branch occlusion. No intracranial aneurysm.  IMPRESSION: Acute nonhemorrhagic LEFT MCA lenticulostriate territory infarct affecting deep white matter, posterior basal ganglia, and internal capsule.  Mild subcortical and periventricular T2 and FLAIR hyperintensities, likely chronic microvascular ischemic change.  50% mid basilar stenosis does not correlate with the observed acute pattern of ischemia, otherwise unremarkable MRA intracranial circulation.   Electronically Signed   By: Rolla Flatten M.D.   On: 03/10/2014 14:23     EKG Interpretation   Date/Time:  Tuesday March 10 2014 11:15:38 EST Ventricular Rate:  59 PR Interval:  156 QRS Duration: 88 QT Interval:  417 QTC Calculation: 413 R Axis:   52 Text Interpretation:  Sinus rhythm Ventricular premature complex Left  ventricular hypertrophy Anterior Q waves, possibly due to LVH Nonspecific  ST and T wave abnormality pvcs are new Confirmed by Alvino Chapel  MD, Ovid Curd  [phone]) on 03/10/2014 11:51:37 AM      MDM   Final diagnoses:  Palpitations  Weakness  Stroke    Patient with difficulty using her hands and possibly some speech issues. Began a day or 2 ago. CT is nonspecific and after discussion with neurology got MRI. MRI showed acute stroke but not a TPA candidate due to time of onset. Relatively normal neurologic  exam by me. Will transfer to [facility] for admission at the stroke center    [facility]. Alvino Chapel, MD 03/10/14 1553

## 2014-03-10 NOTE — ED Notes (Signed)
PIV site placement assessed by ED Charge PIV site to stay in place

## 2014-03-10 NOTE — ED Notes (Signed)
Report given to Wes with [facility] 

## 2014-03-10 NOTE — ED Notes (Signed)
Report given to [facility] called and made aware of need to transport patient

## 2014-03-10 NOTE — ED Notes (Signed)
Patient taken to MRI via stretcher Patient in NAD upon leaving for testing

## 2014-03-10 NOTE — ED Notes (Signed)
Patient transported to CT via stretcher Patient in NAD upon leaving for testing and remains alert and oriented x 4 2nd neuro check completed

## 2014-03-10 NOTE — H&P (Signed)
History and Physical  [name] OFB:[id number] DOB: 1931/09/06 DOA: 03/10/2014  Referring physician: Davonna Belling, ER physician PCP: Hayden Rasmussen., MD   Chief Complaint: discoordination  HPI: [name] is a 80 y.o. female  With past oral history of hypothyroidism and hypertension who several days ago started noticing an irregularity in some of her daily activities. She plays the violin daily and felt like she could not coordinate and play properly. Lately she been very stressed, so she attributed that to this. When trying to type at an email yesterday, she could not get the words correctly. Today when she tried to write, she noted that she had to present in very very tiny slow lettering just to get this correct. She was concerned so she came into the emergency room. Blood work initially done was unremarkable. Blood pressures are noted to be markedly elevated ranging [id number] systolic. Suspected stroke or TIA. MRI checked and patient was found to have an acute nonhemorrhagic left MCA lenticulostriate infarct involving deep white matter, posterior basal ganglia and internal capsule. In addition, patient was noted to have50% mid basilar stenosis of the left AICA, although this does not match up with the patient's stroke pattern. Randomly, patient had an alcohol level checked and found to be mildly elevated at 13. She states showing has a glass of wine intermittently at night. This level was checked at 10:00 in the morning. Hospitalists were called for evaluation and neurology was consulted.  Patient came to [facility] and went found to have positive stroke, transferred to [place] [facility]:  Patient seen in emergency room. Pt complains of being hungry.  Pt denies any headaches, vision changes, dysphagia, chest pain, palpitations, shortness of breath, wheeze, cough, abdominal pain, hematuria, dysuria, constipation, diarrhea, focal extremity numbness or weakness or  pain, nausea or vomiting.  Review of systems are otherwise negative  Past Medical History  Diagnosis Date  . Hypothyroidism 3-21- 13    tx. levothyroxine  . Blood transfusion feb 2014    post surgery some time ago  . Arthritis 07-27-11    osteoarthritis-hips/s/p bil. THA, arthritis right hand   . Hypertension   . Anemia     hx of with surgery   . Hard of hearing     both ears mild loss  . Complication of anesthesia     epinephrine - screams uncontrollably / pt does not want general anesthesia or narcotics  . Cold hands   . Osteopenia   . Constipation   . Cancer 1989    Breast cancer -s/p lt. mastectomy, some squamous cell lesions  . Fuchs' corneal dystrophy     both eyes   Past Surgical History  Procedure Laterality Date  . Ankle surgery  2005    right ankle tendon repair  . Cataract extraction, bilateral  few yrs ago    Bilateral  . Squamous cell carcinoma excision  07-27-11    x2 left shoulder  . Orif femur fracture  07/31/2011    Procedure: OPEN REDUCTION INTERNAL FIXATION (ORIF) DISTAL FEMUR FRACTURE;  Surgeon: Gearlean Alf, MD;  Location: [facility];  Service: Orthopedics;  Laterality: Right;  right femur  (c-arm)   . Hardware removal  04/05/2012    Procedure: HARDWARE REMOVAL;  Surgeon: Gearlean Alf, MD;  Location: [facility];  Service: Orthopedics;  Laterality: Right;  Removal of Hardware Right Femur   . Total hip revision  04/10/2012    Procedure: TOTAL HIP REVISION;  Surgeon: Gearlean Alf, MD;  Location: [facility];  Service: Orthopedics;  Laterality: Right;  . Breast surgery  07-1987    lt.breast cancer intraductal cancer, mastectomy  . Joint replacement  07-27-11    Bilateral: Right THA - 04/2000, Left THA 06/2001  . Left hip replacement Left feb 2003  . Total hip revision Left 06/27/2012    Procedure: LEFT TOTAL HIP REVISION;  Surgeon: Gearlean Alf, MD;  Location: [facility];  Service: Orthopedics;  Laterality: Left;  Marland Kitchen Mastectomy  1989    left - Pt states has no  restrictions on use of L arm for BP or blood draw  . Hardware removal Left 06/30/2013    Procedure: LEFT HIP HARDWARE REMOVAL OF CABLES;  Surgeon: Gearlean Alf, MD;  Location: [facility];  Service: Orthopedics;  Laterality: Left;  Marland Kitchen Melanoma removed Left     x 1  . Basal cell removed from face      3-4 times  . Abdominal hysterectomy      ovaries removed  . Hardware removal Right 08/21/2013    Procedure: RIGHT HIP HARDWARE REMOVAL;  Surgeon: Gearlean Alf, MD;  Location: [facility];  Service: Orthopedics;  Laterality: Right;   Social History:  reports that she quit smoking about 50 years ago. Her smoking use included Cigarettes. She has a 10 pack-year smoking history. She has never used smokeless tobacco. She reports that she drinks about 0.6 oz of alcohol per week. She reports that she does not use illicit drugs. Patient lives at home by herself & is able to participate in activities of daily living without assistance, although she is using a walking stick when hiking  Allergies  Allergen Reactions  . Epinephrine Other (See Comments)     Reaction:  Caused pt to scream uncontrollably.    Family History  Problem Relation Age of Onset  . Hodgkin's lymphoma Mother   . Celiac disease Mother   . Melanoma Father   . Heart disease Paternal Grandmother   . Heart disease Paternal Grandfather     Discussed with patient  Prior to Admission medications   Medication Sig Start Date End Date Taking? Authorizing Provider  amLODipine (NORVASC) 5 MG tablet Take 5 mg by mouth every morning.    Yes Historical Provider, MD  aspirin EC 81 MG tablet Take 81 mg by mouth at bedtime.   Yes Historical Provider, MD  CALCIUM-VITAMIN D PO Take 1 tablet by mouth daily.   Yes Historical Provider, MD  Hyaluronic Acid-Vitamin C (HYALURONIC ACID PO) Take 1 tablet by mouth daily.   Yes Historical Provider, MD  levothyroxine (SYNTHROID, LEVOTHROID) 125 MCG tablet Take 125 mcg by mouth daily before breakfast.   Yes  Historical Provider, MD  methylcellulose (ARTIFICIAL TEARS) 1 % ophthalmic solution Place 1 drop into both eyes daily as needed (for dry eyes).    Yes Historical Provider, MD  Multiple Vitamins-Iron (MULTIVITAMINS WITH IRON) TABS tablet Take 1 tablet by mouth daily.    Yes Historical Provider, MD  Omega-3 Fatty Acids (FISH OIL) 1200 MG CAPS Take 1,200 mg by mouth 2 (two) times daily. Pt takes one at noon and one at night.   Yes Historical Provider, MD  OVER THE COUNTER MEDICATION Take 1 tablet by mouth every other day. Pt takes sunflower levithin supplement.  (1200 mg tablet)   Yes Historical Provider, MD  Vitamin D, Cholecalciferol, 1000 UNITS TABS Take 1 tablet by mouth daily with supper.   Yes Historical Provider,  MD  naproxen sodium (ANAPROX) 220 MG tablet Take 220 mg by mouth daily as needed (for pain).     Historical Provider, MD    Physical Exam: BP 179/76 mmHg  Pulse 58  Temp(Src) 97.7 F (36.5 C) (Oral)  Resp 18  Ht 5\' 5"  (1.651 m)  Wt 55.4 kg (122 lb 2.2 oz)  BMI 20.32 kg/m2  SpO2 100%  General:  Alert and oriented 3, no acute distress Eyes: sclerae nonicteric, extract movements are intact ENT: normocephalic and major medical mucous murmurs are slightly dry Neck: supple, no JVD Cardiovascular: regular rate and rhythm, S1-S2 Respiratory: clear to auscultation bilaterally Abdomen: soft, nontender, nondistended, positive bowel sounds Skin: skin breaks, tears or lesions Musculoskeletal: no clubbing or cyanosis or edema Psychiatric: patient is appropriate, no evidence of psychoses Neurologic: no deficits noted. Downgoing toes. Cranial nerves II through XII are intact. Normal finger to nose.          Labs on Admission:  Basic Metabolic Panel:  Recent Labs Lab 03/10/14 1134 03/10/14 1155  NA 142 140  K 4.3 4.1  CL 103 102  CO2 29  --   GLUCOSE 106* 103*  BUN 11 10  CREATININE 0.60 0.60  CALCIUM 9.5  --    Liver Function Tests:  Recent Labs Lab 03/10/14 1134    AST 19  ALT 14  ALKPHOS 78  BILITOT 0.3  PROT 7.4  ALBUMIN 4.1   No results for input(s): LIPASE, AMYLASE in the last 168 hours. No results for input(s): AMMONIA in the last 168 hours. CBC:  Recent Labs Lab 03/10/14 1134 03/10/14 1155  WBC 6.0  --   NEUTROABS 3.7  --   HGB 12.7 14.6  HCT 39.2 43.0  MCV 90.1  --   PLT 234  --    Cardiac Enzymes: No results for input(s): CKTOTAL, CKMB, CKMBINDEX, TROPONINI in the last 168 hours.  BNP (last 3 results) No results for input(s): PROBNP in the last 8760 hours. CBG: No results for input(s): GLUCAP in the last 168 hours.  Radiological Exams on Admission: Dg Chest 2 View  03/10/2014   CLINICAL DATA:  Palpitations.  EXAM: CHEST  2 VIEW  COMPARISON:  June 24, 2013.  FINDINGS: The heart size and mediastinal contours are within normal limits. Both lungs are clear. No pneumothorax or pleural effusion is noted. The visualized skeletal structures are unremarkable.  IMPRESSION: No acute cardiopulmonary abnormality seen.   Electronically Signed   By: Sabino Dick M.D.   On: 03/10/2014 12:27   Ct Head Wo Contrast  03/10/2014   CLINICAL DATA:  Hypertension. Difficulty with speech and difficulty using the hands for fine movements.  EXAM: CT HEAD WITHOUT CONTRAST  TECHNIQUE: Contiguous axial images were obtained from the base of the skull through the vertex without intravenous contrast.  COMPARISON:  None.  FINDINGS: Subtle white matter hypodensity in the left periventricular white matter, images 16 through 17 of series 2, mildly asymmetric compared to the contralateral side. Suspected small remote lacunar infarct along the upper margin of the left lentiform nucleus.  Otherwise, the brainstem, cerebellum, cerebral peduncles, thalamus, basal ganglia, basilar cisterns, and ventricular system appear within normal limits. No intracranial hemorrhage or mass lesion observed.  IMPRESSION: 1. Slightly asymmetric left periventricular white matter  hypodensity may well simply be a manifestation of chronic ischemic microvascular white matter disease. Subacute infarct is statistically less likely but not entirely excluded. MRI of the brain could differentiate, if clinically warranted. 2. Remote tiny lacunar  infarct along the upper margin of the left lentiform nucleus.   Electronically Signed   By: Sherryl Barters M.D.   On: 03/10/2014 12:25   Mr Jodene Nam Head Wo Contrast  03/10/2014   CLINICAL DATA:  Sudden onset of dizziness, weakness, and slurred speech. Initial encounter.  EXAM: MRI HEAD WITHOUT CONTRAST  MRA HEAD WITHOUT CONTRAST  TECHNIQUE: Multiplanar, multiecho pulse sequences of the brain and surrounding structures were obtained without intravenous contrast. Angiographic images of the head were obtained using MRA technique without contrast.  COMPARISON:  CT head earlier today.  FINDINGS: MRI HEAD FINDINGS  Scattered foci of restricted diffusion in the LEFT periventricular white matter/ centrum semiovale, also involving the posterior limb internal capsule and posterior basal ganglia, consistent with an acute LEFT MCA lenticulostriate infarct.  No visible hemorrhage, mass lesion, hydrocephalus, or extra-axial fluid.  Normal for age cerebral volume. Mild subcortical and periventricular T2 and FLAIR hyperintensities, likely chronic microvascular ischemic change. Pituitary, pineal, and cerebellar tonsils unremarkable. No upper cervical lesions. Flow voids are maintained throughout the carotid, basilar, and vertebral arteries. There are no areas of chronic hemorrhage. Negative orbits. No sinus or mastoid disease. No osseous lesions. Scalp soft tissues unremarkable.  MRA HEAD FINDINGS  The internal carotid arteries are widely patent. The basilar artery displays a focal area of stenosis just inferior to the LEFT AICA origin, estimated 50%. Both vertebrals contribute to basilar formation, LEFT dominant. Proximal basilar fenestration is a normal variant.  There is  no proximal stenosis of the anterior, middle, or posterior cerebral arteries. No cerebellar branch occlusion. No intracranial aneurysm.  IMPRESSION: Acute nonhemorrhagic LEFT MCA lenticulostriate territory infarct affecting deep white matter, posterior basal ganglia, and internal capsule.  Mild subcortical and periventricular T2 and FLAIR hyperintensities, likely chronic microvascular ischemic change.  50% mid basilar stenosis does not correlate with the observed acute pattern of ischemia, otherwise unremarkable MRA intracranial circulation.   Electronically Signed   By: Rolla Flatten M.D.   On: 03/10/2014 14:23   Mr Brain Wo Contrast  03/10/2014    IMPRESSION: Acute nonhemorrhagic LEFT MCA lenticulostriate territory infarct affecting deep white matter, posterior basal ganglia, and internal capsule.  Mild subcortical and periventricular T2 and FLAIR hyperintensities, likely chronic microvascular ischemic change.  50% mid basilar stenosis does not correlate with the observed acute pattern of ischemia, otherwise unremarkable MRA intracranial circulation.   Electronically Signed   By: Rolla Flatten M.D.   On: 03/10/2014 14:23    EKG: Independently reviewed. Normal sinus rhythm with PVCs and left ventricular hypertrophy  Assessment/Plan Present on Admission:  . Acute CVA (cerebrovascular accident)colon workup in progress. Patient already on aspirin. No history of diabetes and does not smoke. States blood pressures usually pretty well controlled. Lipid panel in the morning. We'll check echocardiogram and carotid Dopplers. In addition, patient did note on Friday evening that she felt like her chest was fluttering like crazy, will keep on telemetry. We'll check echo closely to see if atrium enlarged as perhaps she may have paroxysmal A. fib . Hypothyroidism: Check TSH . Hypertension: Central, with elevated blood pressures in the setting of acute CVA. When necessary IV hydralazine for pressures greater than 180  allowing for permissive elevation at this time . Acute alcohol intoxication: Patient states she only drinks about a glass of wine a night. Interestingly, she had a mildly elevated EtOH level when she came in only at 13 although this was at 10:00 in the morning. Will monitor closely and place on CIWA protocol  just in case  Consultants: neurology  Code Status: full code  Family Communication: no family   Disposition Plan: possibly home tomorrow given minimal impairment of systems and workup complete  Time spent: 35 minutes  [facility] Pager [phone]

## 2014-03-10 NOTE — ED Notes (Signed)
Patient upset r/t wait time for [facility]  Reassurance given Patient in NAD Will continue to await arrival of [facility] staff

## 2014-03-10 NOTE — Progress Notes (Signed)
Pt arrived via [facility] 

## 2014-03-10 NOTE — Consult Note (Signed)
Neurology Consultation Reason for Consult: Stroke Referring Physician: Lyman Speller  CC: hands are doing what they should  History is obtained from:patient  HPI: [name] is a 80 y.o. female with 2 days of difficulty with her hand. She states that she was having difficulty playing the violin as well as using her hand to type. She therefore presented to the emergency room where an MRI was performed which shows a left lenticulostriate infarct. She endorsed speech difficulty to a physician earlier, but states that she doesn't think she really ever had any real difficulty.   Also of note, she has noted urinary frequency.  She also notes irregular heartbeats recently.  LKW: Sunday, 11/1 tpa given?: no, outside of window    ROS: A 14 point ROS was performed and is negative except as noted in the HPI.     Past Medical History  Diagnosis Date  . Hypothyroidism 3-21- 13    tx. levothyroxine  . Blood transfusion feb 2014    post surgery some time ago  . Arthritis 07-27-11    osteoarthritis-hips/s/p bil. THA, arthritis right hand   . Hypertension   . Anemia     hx of with surgery   . Hard of hearing     both ears mild loss  . Complication of anesthesia     epinephrine - screams uncontrollably / pt does not want general anesthesia or narcotics  . Cold hands   . Osteopenia   . Constipation   . Cancer 1989    Breast cancer -s/p lt. mastectomy, some squamous cell lesions  . Fuchs' corneal dystrophy     both eyes    Family History: No history of stroke  Social History: Tob: denies  Exam: Current vital signs: BP 150/78 mmHg  Pulse 56  Temp(Src) 97.3 F (36.3 C) (Oral)  Resp 16  SpO2 99% Vital signs in last 24 hours: Temp:  [97.3 F (36.3 C)] 97.3 F (36.3 C) (11/03 1116) Pulse Rate:  [52-69] 56 (11/03 1830) Resp:  [14-21] 16 (11/03 1820) BP: (150-204)/(71-91) 150/78 mmHg (11/03 1830) SpO2:  [97 %-100 %] 99 % (11/03 1830)  General: in bed, NAD CV: regular rate  and rhythm Mental Status: Patient is awake, alert, oriented to person, place, month, year, and situation. Immediate and remote memory are intact. Patient is able to give a clear and coherent history. No signs of aphasia or neglect Cranial Nerves: II: Visual Fields are full. Pupils are equal, round, and reactive to light.  Discs are difficult to visualize. III,IV, VI: EOMI without ptosis or diploplia.  V: Facial sensation is symmetric to temperature VII: Facial movement is symmetric.  VIII: hearing is intact to voice X: Uvula elevates symmetrically XI: Shoulder shrug is symmetric. XII: tongue is midline without atrophy or fasciculations.  Motor: Tone is normal. Bulk is normal. 5/5 strength was present in all four extremities.  Sensory: Sensation is symmetric to light touch and temperature in the arms and legs. Deep Tendon Reflexes: 2+ and symmetric in the biceps and patellae.  Plantars: Toes are downgoing bilaterally.  Cerebellar: FNF intact bilaterally, but slightly slower on right Gait: Patient has a slightly unsteady gait, which she reports is baseline due to knee surgeries         I have reviewed labs in epic and the results pertinent to this consultation are: CMP-unremarkable  I have reviewed the images obtained:MRI brain-left lenticulostriate infarct  Impression: 80 year old female with small basal ganglia infarct. With her minimal symptoms, I  would expect a fairly good recovery. She will need an evaluation for secondary stroke prevention.  Recommendations: 1. HgbA1c, fasting lipid panel 2. Frequent neuro checks 3. Echocardiogram 4. Carotid dopplers 5. Prophylactic therapy-Antiplatelet med: Aspirin - dose 325mg  PO or 300mg  PR 6. Risk factor modification 7. Telemetry monitoring 8. PT consult, OT consult, Speech consult    Roland Rack, MD [facility] [phone]  If 7pm- 7am, please page neurology on call as listed in [place].

## 2014-03-10 NOTE — ED Notes (Signed)
[facility] called r/t wait time of over two hours to transport patient to [facility] Per Marcello Moores at [place], a truck is still not available and the nearest truck is currently at [employer], when truck finished at [employer], they will then come to pick up patient Patient updated

## 2014-03-10 NOTE — ED Notes (Addendum)
PIV placed in left AC--positive flash, good blood return and flushed without difficulty with 10 ml NS Bruising noted to PIV insertion site, but no signs of infiltration Patient asking for new PIV to be place due to "I think it's in too far." Will have ED Charge assess PIV site and placement

## 2014-03-10 NOTE — ED Notes (Signed)
[facility] present to take patient to [facility] Patient in NAD upon leaving ED

## 2014-03-10 NOTE — ED Notes (Signed)
Unable to perform 1400 Neuro checks---patient still in MRI

## 2014-03-10 NOTE — ED Notes (Addendum)
Dr Prince Rome called, pt refused ems, coming private vehicle, and would only come to [facility]. Pt came to pcp today for c/o since yesterday hand issues, reported her hands were not working right and that she could not type or play her violin, at the same pt having confused speech. In office pt hypertensive. Pt reported possible palpitations last Friday. No hx stroke, hx thyroid disease and HTN.  Upon further rn assessment, pt reports symptoms started yesterday at 0730, hand difficulties have continued today. Reports on Saturday she felt heart palpitations.  hand grips and leg pushes equal. Possible slight right sided facial droop, not noticeable when pt smiles. Denies extremity weakness. Pt alert and oriented x4. Denies pain Family reports pts is acting herself, maybe slightly confused.

## 2014-03-11 DIAGNOSIS — I10 Essential (primary) hypertension: Secondary | ICD-10-CM

## 2014-03-11 DIAGNOSIS — I517 Cardiomegaly: Secondary | ICD-10-CM

## 2014-03-11 DIAGNOSIS — F10120 Alcohol abuse with intoxication, uncomplicated: Secondary | ICD-10-CM

## 2014-03-11 LAB — HEMOGLOBIN A1C
HEMOGLOBIN A1C: 5.5 % (ref <5.7)
MEAN PLASMA GLUCOSE: 111 mg/dL (ref <117)

## 2014-03-11 LAB — LIPID PANEL
CHOLESTEROL: 178 mg/dL (ref 0–200)
HDL: 68 mg/dL (ref >39)
LDL Cholesterol: 96 mg/dL (ref 0–99)
TRIGLYCERIDES: 68 mg/dL (ref <150)
Total CHOL/HDL Ratio: 2.6 RATIO
VLDL: 14 mg/dL (ref 0–40)

## 2014-03-11 MED ORDER — ATORVASTATIN CALCIUM 10 MG PO TABS
10.0000 mg | ORAL_TABLET | Freq: Every day | ORAL | Status: DC
Start: 2014-03-11 — End: 2014-07-06

## 2014-03-11 MED ORDER — CLOPIDOGREL BISULFATE 75 MG PO TABS
75.0000 mg | ORAL_TABLET | Freq: Every day | ORAL | Status: DC
  Administered 2014-03-11: 75 mg via ORAL
  Filled 2014-03-11: qty 1

## 2014-03-11 MED ORDER — CLOPIDOGREL BISULFATE 75 MG PO TABS: 75.0000 mg | ORAL_TABLET | Freq: Every day | ORAL | Status: DC

## 2014-03-11 NOTE — Progress Notes (Signed)
PT saw patient and has deemed her a low fall risk.  This RN has checked off patient independent.  Kizzie Bane RN

## 2014-03-11 NOTE — Discharge Instructions (Signed)
Follow with Primary MD Hayden Rasmussen., MD in 7 days   Get CBC, CMP, 2 view Chest X ray checked  by Primary MD next visit.    Activity: As tolerated with Full fall precautions use walker/cane & assistance as needed   Disposition Home     Diet: Heart Healthy    For Heart failure patients - Check your Weight same time everyday, if you gain over 2 pounds, or you develop in leg swelling, experience more shortness of breath or chest pain, call your Primary MD immediately. Follow Cardiac Low Salt Diet and 1.8 lit/day fluid restriction.   On your next visit with your primary care physician please Get Medicines reviewed and adjusted.   Please request your Prim.MD to go over all Hospital Tests and Procedure/Radiological results at the follow up, please get all Hospital records sent to your Prim MD by signing hospital release before you go home.   If you experience worsening of your admission symptoms, develop shortness of breath, life threatening emergency, suicidal or homicidal thoughts you must seek medical attention immediately by calling 911 or calling your MD immediately  if symptoms less severe.  You Must read complete instructions/literature along with all the possible adverse reactions/side effects for all the Medicines you take and that have been prescribed to you. Take any new Medicines after you have completely understood and accpet all the possible adverse reactions/side effects.   Do not drive, operating heavy machinery, perform activities at heights, swimming or participation in water activities or provide baby sitting services if your were admitted for syncope or siezures until you have seen by Primary MD or a Neurologist and advised to do so again.  Do not drive when taking Pain medications.    Do not take more than prescribed Pain, Sleep and Anxiety Medications  Special Instructions: If you have smoked or chewed Tobacco  in the last 2 yrs please stop smoking, stop any  regular Alcohol  and or any Recreational drug use.  Wear Seat belts while driving.   Please note  You were cared for by a hospitalist during your hospital stay. If you have any questions about your discharge medications or the care you received while you were in the hospital after you are discharged, you can call the unit and asked to speak with the hospitalist on call if the hospitalist that took care of you is not available. Once you are discharged, your primary care physician will handle any further medical issues. Please note that NO REFILLS for any discharge medications will be authorized once you are discharged, as it is imperative that you return to your primary care physician (or establish a relationship with a primary care physician if you do not have one) for your aftercare needs so that they can reassess your need for medications and monitor your lab values.

## 2014-03-11 NOTE — Progress Notes (Signed)
*  PRELIMINARY RESULTS* Echocardiogram 2D Echocardiogram has been performed.  Leavy Cella 03/11/2014, 10:33 AM

## 2014-03-11 NOTE — Evaluation (Signed)
Speech Language Pathology Evaluation Patient Details Name: [name] MRN: [id number] DOB: November 08, 1931 Today's Date: 03/11/2014 Time: 2426-8341 SLP Time Calculation (min): 18 min  Problem List:  Patient Active Problem List   Diagnosis Date Noted  . Acute CVA (cerebrovascular accident) 03/10/2014  . Hypertension 03/10/2014  . Acute alcohol intoxication 03/10/2014  . Postoperative anemia due to acute blood loss 06/28/2012  . Failed total hip arthroplasty 06/27/2012  . Painful orthopaedic hardware 04/05/2012  . Postop Hyponatremia 08/09/2011  . Postop Acute blood loss anemia 08/09/2011  . Postop Transfusion 08/09/2011  . Peri-prosthetic fracture of femur following total hip arthroplasty 07/31/2011  . Hypothyroidism 07/27/2011   Past Medical History:  Past Medical History  Diagnosis Date  . Hypothyroidism 3-21- 13    tx. levothyroxine  . Blood transfusion feb 2014    post surgery some time ago  . Arthritis 07-27-11    osteoarthritis-hips/s/p bil. THA, arthritis right hand   . Hypertension   . Anemia     hx of with surgery   . Hard of hearing     both ears mild loss  . Complication of anesthesia     epinephrine - screams uncontrollably / pt does not want general anesthesia or narcotics  . Cold hands   . Osteopenia   . Constipation   . Cancer 1989    Breast cancer -s/p lt. mastectomy, some squamous cell lesions  . Fuchs' corneal dystrophy     both eyes   Past Surgical History:  Past Surgical History  Procedure Laterality Date  . Ankle surgery  2005    right ankle tendon repair  . Cataract extraction, bilateral  few yrs ago    Bilateral  . Squamous cell carcinoma excision  07-27-11    x2 left shoulder  . Orif femur fracture  07/31/2011    Procedure: OPEN REDUCTION INTERNAL FIXATION (ORIF) DISTAL FEMUR FRACTURE;  Surgeon: Gearlean Alf, MD;  Location: [facility];  Service: Orthopedics;  Laterality: Right;  right femur  (c-arm)   . Hardware removal  04/05/2012     Procedure: HARDWARE REMOVAL;  Surgeon: Gearlean Alf, MD;  Location: [facility];  Service: Orthopedics;  Laterality: Right;  Removal of Hardware Right Femur   . Total hip revision  04/10/2012    Procedure: TOTAL HIP REVISION;  Surgeon: Gearlean Alf, MD;  Location: [facility];  Service: Orthopedics;  Laterality: Right;  . Breast surgery  07-1987    lt.breast cancer intraductal cancer, mastectomy  . Joint replacement  07-27-11    Bilateral: Right THA - 04/2000, Left THA 06/2001  . Left hip replacement Left feb 2003  . Total hip revision Left 06/27/2012    Procedure: LEFT TOTAL HIP REVISION;  Surgeon: Gearlean Alf, MD;  Location: [facility];  Service: Orthopedics;  Laterality: Left;  Marland Kitchen Mastectomy  1989    left - Pt states has no restrictions on use of L arm for BP or blood draw  . Hardware removal Left 06/30/2013    Procedure: LEFT HIP HARDWARE REMOVAL OF CABLES;  Surgeon: Gearlean Alf, MD;  Location: [facility];  Service: Orthopedics;  Laterality: Left;  Marland Kitchen Melanoma removed Left     x 1  . Basal cell removed from face      3-4 times  . Abdominal hysterectomy      ovaries removed  . Hardware removal Right 08/21/2013    Procedure: RIGHT HIP HARDWARE REMOVAL;  Surgeon: Gearlean Alf, MD;  Location: [facility];  Service: Orthopedics;  Laterality: Right;   HPI:  Patient is a 80 y/o female who has noticed difficulty playing the violin as well as using her hand to type the last couple days. Came to the ER due to concerns. MRI brain-acute nonhemorrhagic left MCA lenticulostriate infarct involving deep white matter, posterior basal ganglia and internal capsule. Alcohol level mildly elevated at 13.      Assessment / Plan / Recommendation Clinical Impression  Patient appears to be functioning at her baseline level of function, and has no subjective complaints. Pt is within gross functional limits for cognitive-linguistic function and speech is intelligibile at the conversational level. No further SLP f/u  indicated.    SLP Assessment  Patient does not need any further Speech Lanaguage Pathology Services    Follow Up Recommendations  None    Frequency and Duration        Pertinent Vitals/Pain Pain Assessment: No/denies pain   SLP Goals  Patient/Family Stated Goal: wants to make it to her book club tonight  SLP Evaluation Prior Functioning  Cognitive/Linguistic Baseline: Within functional limits Type of Home: House  Lives With: Alone Available Help at Discharge: Family;Available PRN/intermittently (daughter lives next door)   Cognition  Overall Cognitive Status: Within Functional Limits for tasks assessed Orientation Level: Oriented X4    Comprehension  Auditory Comprehension Overall Auditory Comprehension: Appears within functional limits for tasks assessed Visual Recognition/Discrimination Discrimination: Within Function Limits Reading Comprehension Reading Status: Not tested    Expression Expression Primary Mode of Expression: Verbal Verbal Expression Overall Verbal Expression: Appears within functional limits for tasks assessed Written Expression Dominant Hand: Right Written Expression: Not tested   Oral / Motor Motor Speech Overall Motor Speech: Appears within functional limits for tasks assessed   GO Functional Assessment Tool Used: skilled clinical judgment Functional Limitations: Memory Memory Current Status (P5945): At least 1 percent but less than 20 percent impaired, limited or restricted Memory Goal Status (O5929): At least 1 percent but less than 20 percent impaired, limited or restricted Memory Discharge Status [phone]): At least 1 percent but less than 20 percent impaired, limited or restricted    [name], M.A. CCC-SLP [phone]  [name] 03/11/2014, 2:30 PM

## 2014-03-11 NOTE — Progress Notes (Signed)
Patient education and paperwork completed.  IV removed and prescriptions sent to patient's pharmacy.  Kerri Perches

## 2014-03-11 NOTE — Discharge Summary (Signed)
[name], is a 80 y.o. female  DOB [birth year]  MRN [id number].  Admission date:  03/10/2014  Admitting Physician  Annita Brod, MD  Discharge Date:  03/11/2014   Primary MD  Hayden Rasmussen., MD  Recommendations for primary care physician for things to follow:   Follow secondary risk factors for stroke, please check a TSH next visit.   Admission Diagnosis  Palpitations [R00.2] Weakness [R53.1] Stroke [I63.9]   Discharge Diagnosis  Palpitations [R00.2] Weakness [R53.1] Stroke [I63.9]     Active Problems:   Acute CVA (cerebrovascular accident)   Hypothyroidism   Hypertension   Acute alcohol intoxication      Past Medical History  Diagnosis Date  . Hypothyroidism 3-21- 13    tx. levothyroxine  . Blood transfusion feb 2014    post surgery some time ago  . Arthritis 07-27-11    osteoarthritis-hips/s/p bil. THA, arthritis right hand   . Hypertension   . Anemia     hx of with surgery   . Hard of hearing     both ears mild loss  . Complication of anesthesia     epinephrine - screams uncontrollably / pt does not want general anesthesia or narcotics  . Cold hands   . Osteopenia   . Constipation   . Cancer 1989    Breast cancer -s/p lt. mastectomy, some squamous cell lesions  . Fuchs' corneal dystrophy     both eyes    Past Surgical History  Procedure Laterality Date  . Ankle surgery  2005    right ankle tendon repair  . Cataract extraction, bilateral  few yrs ago    Bilateral  . Squamous cell carcinoma excision  07-27-11    x2 left shoulder  . Orif femur fracture  07/31/2011    Procedure: OPEN REDUCTION INTERNAL FIXATION (ORIF) DISTAL FEMUR FRACTURE;  Surgeon: Gearlean Alf, MD;  Location: [facility];  Service: Orthopedics;  Laterality: Right;  right femur  (c-arm)   . Hardware removal  04/05/2012   Procedure: HARDWARE REMOVAL;  Surgeon: Gearlean Alf, MD;  Location: [facility];  Service: Orthopedics;  Laterality: Right;  Removal of Hardware Right Femur   . Total hip revision  04/10/2012    Procedure: TOTAL HIP REVISION;  Surgeon: Gearlean Alf, MD;  Location: [facility];  Service: Orthopedics;  Laterality: Right;  . Breast surgery  07-1987    lt.breast cancer intraductal cancer, mastectomy  . Joint replacement  07-27-11    Bilateral: Right THA - 04/2000, Left THA 06/2001  . Left hip replacement Left feb 2003  . Total hip revision Left 06/27/2012    Procedure: LEFT TOTAL HIP REVISION;  Surgeon: Gearlean Alf, MD;  Location: [facility];  Service: Orthopedics;  Laterality: Left;  Marland Kitchen Mastectomy  1989    left - Pt states has no restrictions on use of L arm for BP or blood draw  . Hardware removal Left 06/30/2013    Procedure: LEFT HIP HARDWARE REMOVAL OF CABLES;  Surgeon: Pilar Plate  Zella Ball, MD;  Location: [facility];  Service: Orthopedics;  Laterality: Left;  Marland Kitchen Melanoma removed Left     x 1  . Basal cell removed from face      3-4 times  . Abdominal hysterectomy      ovaries removed  . Hardware removal Right 08/21/2013    Procedure: RIGHT HIP HARDWARE REMOVAL;  Surgeon: Gearlean Alf, MD;  Location: [facility];  Service: Orthopedics;  Laterality: Right;       History of present illness and  Hospital Course:     Kindly see H&P for history of present illness and admission details, please review complete Labs, Consult reports and Test reports for all details in brief  HPI  from the history and physical done on the day of admission   [name] is a 80 y.o. female with past oral history of hypothyroidism and hypertension who several days ago started noticing an irregularity in some of her daily activities. She plays the violin daily and felt like she could not coordinate and play properly. Lately she been very stressed, so she attributed that to this. When trying to type at an email yesterday, she could  not get the words correctly. Today when she tried to write, she noted that she had to present in very very tiny slow lettering just to get this correct. She was concerned so she came into the emergency room. Blood work initially done was unremarkable. Blood pressures are noted to be markedly elevated ranging 308M-578 systolic. Suspected stroke or TIA. MRI checked and patient was found to have an acute nonhemorrhagic left MCA lenticulostriate infarct involving deep white matter, posterior basal ganglia and internal capsule. In addition, patient was noted to have 50% mid basilar stenosis of the left AICA, although this does not match up with the patient's stroke pattern. Randomly, patient had an alcohol level checked and found to be mildly elevated at 13.   She states showing has a glass of wine intermittently at night. This level was checked at 10:00 in the morning. Hospitalists were called for evaluation and neurology was consulted. Patient came to [facility] hospital and went found to have positive stroke, transferred to [facility] Course    1.L MCA CVA - symptoms have resolved, seen by neurology PTOT and speech. Switch from aspirin to Plavix. Echogram stable. LDL 96 placed on statin, A1c 5.5, Carotid duplex stable,  Will follow with PCP and neurology outpatient post discharge.   2. Palpitations. Echogram stable, mild sinus bradycardia with few PACs on EKG. Requested Holter monitor outpatient. Request PCP to check a TSH.   3. History of occasional alcohol intake. He says drinks only one glass of wine every night. Counseled not to drink more than that.   4. Essential hypertension. Resume home regimen.     Discharge Condition: stable   Follow UP  Follow-up Information    Follow up with [facility]. Schedule an appointment as soon as possible for a visit in 1 week.   Specialty:  Family Medicine   Contact information:   [address] [place] [address] [phone]       Follow up with [facility]. Schedule an appointment as soon as possible for a visit in 1 week.   Contact information:   [address]     [place] [id number] [phone]      Follow up with Cristopher Peru, MD.  Schedule an appointment as soon as possible for a visit in 1 week.   Specialty:  Cardiology   Contact information:   [address] [place] [address] [phone]         Discharge Instructions  and  Discharge Medications          Discharge Instructions    Diet - low sodium heart healthy    Complete by:  As directed      Discharge instructions    Complete by:  As directed   Follow with Primary MD Hayden Rasmussen., MD in 7 days   Get CBC, CMP, 2 view Chest X ray checked  by Primary MD next visit.    Activity: As tolerated with Full fall precautions use walker/cane & assistance as needed   Disposition Home     Diet: Heart Healthy    For Heart failure patients - Check your Weight same time everyday, if you gain over 2 pounds, or you develop in leg swelling, experience more shortness of breath or chest pain, call your Primary MD immediately. Follow Cardiac Low Salt Diet and 1.8 lit/day fluid restriction.   On your next visit with your primary care physician please Get Medicines reviewed and adjusted.   Please request your Prim.MD to go over all Hospital Tests and Procedure/Radiological results at the follow up, please get all Hospital records sent to your Prim MD by signing hospital release before you go home.   If you experience worsening of your admission symptoms, develop shortness of breath, life threatening emergency, suicidal or homicidal thoughts you must seek medical attention immediately by calling 911 or calling your MD immediately  if symptoms less severe.  You Must read complete instructions/literature along with all the possible  adverse reactions/side effects for all the Medicines you take and that have been prescribed to you. Take any new Medicines after you have completely understood and accpet all the possible adverse reactions/side effects.   Do not drive, operating heavy machinery, perform activities at heights, swimming or participation in water activities or provide baby sitting services if your were admitted for syncope or siezures until you have seen by Primary MD or a Neurologist and advised to do so again.  Do not drive when taking Pain medications.    Do not take more than prescribed Pain, Sleep and Anxiety Medications  Special Instructions: If you have smoked or chewed Tobacco  in the last 2 yrs please stop smoking, stop any regular Alcohol  and or any Recreational drug use.  Wear Seat belts while driving.   Please note  You were cared for by a hospitalist during your hospital stay. If you have any questions about your discharge medications or the care you received while you were in the hospital after you are discharged, you can call the unit and asked to speak with the hospitalist on call if the hospitalist that took care of you is not available. Once you are discharged, your primary care physician will handle any further medical issues. Please note that NO REFILLS for any discharge medications will be authorized once you are discharged, as it is imperative that you return to your primary care physician (or establish a relationship with a primary care physician if you do not have one) for your aftercare needs so that they can reassess your need for medications and monitor your lab values.     Increase activity slowly    Complete by:  As directed  Medication List    STOP taking these medications        aspirin EC 81 MG tablet      TAKE these medications        amLODipine 5 MG tablet  Commonly known as:  NORVASC  Take 5 mg by mouth every morning.     atorvastatin 10 MG tablet   Commonly known as:  LIPITOR  Take 1 tablet (10 mg total) by mouth daily.     CALCIUM-VITAMIN D PO  Take 1 tablet by mouth daily.     clopidogrel 75 MG tablet  Commonly known as:  PLAVIX  Take 1 tablet (75 mg total) by mouth daily.     Fish Oil 1200 MG Caps  Take 1,200 mg by mouth 2 (two) times daily. Pt takes one at noon and one at night.     HYALURONIC ACID PO  Take 1 tablet by mouth daily.     levothyroxine 125 MCG tablet  Commonly known as:  SYNTHROID, LEVOTHROID  Take 125 mcg by mouth daily before breakfast.     methylcellulose 1 % ophthalmic solution  Commonly known as:  ARTIFICIAL TEARS  Place 1 drop into both eyes daily as needed (for dry eyes).     multivitamins with iron Tabs tablet  Take 1 tablet by mouth daily.     naproxen sodium 220 MG tablet  Commonly known as:  ANAPROX  Take 220 mg by mouth daily as needed (for pain).     OVER THE COUNTER MEDICATION  Take 1 tablet by mouth every other day. Pt takes sunflower levithin supplement.  (1200 mg tablet)     Vitamin D (Cholecalciferol) 1000 UNITS Tabs  Take 1 tablet by mouth daily with supper.          Diet and Activity recommendation: See Discharge Instructions above   Consults obtained - Neuro   Major procedures and Radiology Reports - PLEASE review detailed and final reports for all details, in brief -     Carotids  Bilateral carotid artery duplex completed: 1-39% ICA stenosis. Vertebral artery flow is antegrade.   TTE  Study Conclusions  - Left ventricle: The cavity size was normal. Wall thickness was increased in a pattern of mild LVH. Systolic function was normal. The estimated ejection fraction was in the range of 60% to 65%. Wall motion was normal; there were no regional wall motion abnormalities. Doppler parameters are consistent with abnormal left ventricular relaxation (grade 1 diastolic dysfunction). - Mitral valve: Calcified annulus. - Left atrium: The atrium was mildly  dilated. - Right ventricle: The cavity size was mildly dilated. Wall thickness was normal. - Right atrium: The atrium was mildly dilated. - Pulmonary arteries: Systolic pressure was mildly increased. PA peak pressure: 32 mm Hg (S).  Impressions:  - No cardiac source of emboli was indentified.  Dg Chest 2 View  03/10/2014   CLINICAL DATA:  Palpitations.  EXAM: CHEST  2 VIEW  COMPARISON:  June 24, 2013.  FINDINGS: The heart size and mediastinal contours are within normal limits. Both lungs are clear. No pneumothorax or pleural effusion is noted. The visualized skeletal structures are unremarkable.  IMPRESSION: No acute cardiopulmonary abnormality seen.   Electronically Signed   By: Sabino Dick M.D.   On: 03/10/2014 12:27   Ct Head Wo Contrast  03/10/2014   CLINICAL DATA:  Hypertension. Difficulty with speech and difficulty using the hands for fine movements.  EXAM: CT HEAD WITHOUT CONTRAST  TECHNIQUE: Contiguous axial images  were obtained from the base of the skull through the vertex without intravenous contrast.  COMPARISON:  None.  FINDINGS: Subtle white matter hypodensity in the left periventricular white matter, images 16 through 17 of series 2, mildly asymmetric compared to the contralateral side. Suspected small remote lacunar infarct along the upper margin of the left lentiform nucleus.  Otherwise, the brainstem, cerebellum, cerebral peduncles, thalamus, basal ganglia, basilar cisterns, and ventricular system appear within normal limits. No intracranial hemorrhage or mass lesion observed.  IMPRESSION: 1. Slightly asymmetric left periventricular white matter hypodensity may well simply be a manifestation of chronic ischemic microvascular white matter disease. Subacute infarct is statistically less likely but not entirely excluded. MRI of the brain could differentiate, if clinically warranted. 2. Remote tiny lacunar infarct along the upper margin of the left lentiform nucleus.   Electronically  Signed   By: Sherryl Barters M.D.   On: 03/10/2014 12:25   Mr Jodene Nam Head Wo Contrast  03/10/2014   CLINICAL DATA:  Sudden onset of dizziness, weakness, and slurred speech. Initial encounter.  EXAM: MRI HEAD WITHOUT CONTRAST  MRA HEAD WITHOUT CONTRAST  TECHNIQUE: Multiplanar, multiecho pulse sequences of the brain and surrounding structures were obtained without intravenous contrast. Angiographic images of the head were obtained using MRA technique without contrast.  COMPARISON:  CT head earlier today.  FINDINGS: MRI HEAD FINDINGS  Scattered foci of restricted diffusion in the LEFT periventricular white matter/ centrum semiovale, also involving the posterior limb internal capsule and posterior basal ganglia, consistent with an acute LEFT MCA lenticulostriate infarct.  No visible hemorrhage, mass lesion, hydrocephalus, or extra-axial fluid.  Normal for age cerebral volume. Mild subcortical and periventricular T2 and FLAIR hyperintensities, likely chronic microvascular ischemic change. Pituitary, pineal, and cerebellar tonsils unremarkable. No upper cervical lesions. Flow voids are maintained throughout the carotid, basilar, and vertebral arteries. There are no areas of chronic hemorrhage. Negative orbits. No sinus or mastoid disease. No osseous lesions. Scalp soft tissues unremarkable.  MRA HEAD FINDINGS  The internal carotid arteries are widely patent. The basilar artery displays a focal area of stenosis just inferior to the LEFT AICA origin, estimated 50%. Both vertebrals contribute to basilar formation, LEFT dominant. Proximal basilar fenestration is a normal variant.  There is no proximal stenosis of the anterior, middle, or posterior cerebral arteries. No cerebellar branch occlusion. No intracranial aneurysm.  IMPRESSION: Acute nonhemorrhagic LEFT MCA lenticulostriate territory infarct affecting deep white matter, posterior basal ganglia, and internal capsule.  Mild subcortical and periventricular T2 and FLAIR  hyperintensities, likely chronic microvascular ischemic change.  50% mid basilar stenosis does not correlate with the observed acute pattern of ischemia, otherwise unremarkable MRA intracranial circulation.   Electronically Signed   By: Rolla Flatten M.D.   On: 03/10/2014 14:23   Mr Brain Wo Contrast  03/10/2014   CLINICAL DATA:  Sudden onset of dizziness, weakness, and slurred speech. Initial encounter.  EXAM: MRI HEAD WITHOUT CONTRAST  MRA HEAD WITHOUT CONTRAST  TECHNIQUE: Multiplanar, multiecho pulse sequences of the brain and surrounding structures were obtained without intravenous contrast. Angiographic images of the head were obtained using MRA technique without contrast.  COMPARISON:  CT head earlier today.  FINDINGS: MRI HEAD FINDINGS  Scattered foci of restricted diffusion in the LEFT periventricular white matter/ centrum semiovale, also involving the posterior limb internal capsule and posterior basal ganglia, consistent with an acute LEFT MCA lenticulostriate infarct.  No visible hemorrhage, mass lesion, hydrocephalus, or extra-axial fluid.  Normal for age cerebral volume. Mild subcortical and  periventricular T2 and FLAIR hyperintensities, likely chronic microvascular ischemic change. Pituitary, pineal, and cerebellar tonsils unremarkable. No upper cervical lesions. Flow voids are maintained throughout the carotid, basilar, and vertebral arteries. There are no areas of chronic hemorrhage. Negative orbits. No sinus or mastoid disease. No osseous lesions. Scalp soft tissues unremarkable.  MRA HEAD FINDINGS  The internal carotid arteries are widely patent. The basilar artery displays a focal area of stenosis just inferior to the LEFT AICA origin, estimated 50%. Both vertebrals contribute to basilar formation, LEFT dominant. Proximal basilar fenestration is a normal variant.  There is no proximal stenosis of the anterior, middle, or posterior cerebral arteries. No cerebellar branch occlusion. No  intracranial aneurysm.  IMPRESSION: Acute nonhemorrhagic LEFT MCA lenticulostriate territory infarct affecting deep white matter, posterior basal ganglia, and internal capsule.  Mild subcortical and periventricular T2 and FLAIR hyperintensities, likely chronic microvascular ischemic change.  50% mid basilar stenosis does not correlate with the observed acute pattern of ischemia, otherwise unremarkable MRA intracranial circulation.   Electronically Signed   By: Rolla Flatten M.D.   On: 03/10/2014 14:23    Micro Results      No results found for this or any previous visit (from the past 240 hour(s)).     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   Objective:   Blood pressure 154/68, pulse 53, temperature 98.1 F (36.7 C), temperature source Oral, resp. rate 18, height 5\' 5"  (1.651 m), weight 55.4 kg (122 lb 2.2 oz), SpO2 100 %.   Intake/Output Summary (Last 24 hours) at 03/11/14 1513 Last data filed at 03/10/14 2200  Gross per 24 hour  Intake    360 ml  Output      0 ml  Net    360 ml    Exam Awake Alert, Oriented x 3, No new F.N deficits, Normal affect [place].AT,PERRAL Supple Neck,No JVD, No cervical lymphadenopathy appriciated.  Symmetrical Chest wall movement, Good air movement bilaterally, CTAB RRR,No Gallops,Rubs or new Murmurs, No Parasternal Heave +ve B.Sounds, Abd Soft, Non tender, No organomegaly appriciated, No rebound -guarding or rigidity. No Cyanosis, Clubbing or edema, No new Rash or bruise  Data Review   CBC w Diff:  Lab Results  Component Value Date   WBC 6.0 03/10/2014   HGB 14.6 03/10/2014   HCT 43.0 03/10/2014   PLT 234 03/10/2014   LYMPHOPCT 24 03/10/2014   MONOPCT 13* 03/10/2014   EOSPCT 2 03/10/2014   BASOPCT 1 03/10/2014    CMP:  Lab Results  Component Value Date   NA 140 03/10/2014   K 4.1 03/10/2014   CL 102 03/10/2014    CO2 29 03/10/2014   BUN 10 03/10/2014   CREATININE 0.60 03/10/2014   PROT 7.4 03/10/2014   ALBUMIN 4.1 03/10/2014   BILITOT 0.3 03/10/2014   ALKPHOS 78 03/10/2014   AST 19 03/10/2014   ALT 14 03/10/2014  .  Lab Results  Component Value Date   CHOL 178 03/11/2014   HDL 68 03/11/2014   LDLCALC 96 03/11/2014   TRIG 68 03/11/2014   CHOLHDL 2.6 03/11/2014    Lab Results  Component Value Date   HGBA1C 5.5 03/11/2014     Total Time in preparing paper work, data evaluation and todays exam - 35 minutes  Thurnell Lose M.D on 03/11/2014 at 3:13 PM  [facility] Office  [phone]

## 2014-03-11 NOTE — Progress Notes (Signed)
STROKE TEAM PROGRESS NOTE   HISTORY [name] is a 80 y.o. female with 2 days of difficulty with her hand. She states that she was having difficulty playing the violin as well as using her hand to type. She therefore presented to the emergency room where an MRI was performed which shows a left lenticulostriate infarct. She endorsed speech difficulty to a physician earlier, but states that she doesn't think she really ever had any real difficulty.   Also of note, she has noted urinary frequency.  She also notes irregular heartbeats recently.  LKW: Sunday, 11/1  Patient was not administered TPA secondary to delay in arrival. She was admitted for further evaluation and treatment.   SUBJECTIVE (INTERVAL HISTORY) No family is at the bedside. Overall she feels she is much improved and is hoping for discharge today.   OBJECTIVE Temp:  [97.1 F (36.2 C)-98.6 F (37 C)] 98.3 F (36.8 C) (11/04 1101) Pulse Rate:  [50-59] 55 (11/04 1101) Cardiac Rhythm:  [-] Sinus bradycardia (11/04 0800) Resp:  [14-20] 18 (11/04 1101) BP: (132-179)/(63-83) 156/77 mmHg (11/04 1101) SpO2:  [92 %-100 %] 99 % (11/04 1101) Weight:  [55.4 kg (122 lb 2.2 oz)] 55.4 kg (122 lb 2.2 oz) (11/03 2030)  No results for input(s): GLUCAP in the last 168 hours.  Recent Labs Lab 03/10/14 1134 03/10/14 1155  NA 142 140  K 4.3 4.1  CL 103 102  CO2 29  --   GLUCOSE 106* 103*  BUN 11 10  CREATININE 0.60 0.60  CALCIUM 9.5  --     Recent Labs Lab 03/10/14 1134  AST 19  ALT 14  ALKPHOS 78  BILITOT 0.3  PROT 7.4  ALBUMIN 4.1    Recent Labs Lab 03/10/14 1134 03/10/14 1155  WBC 6.0  --   NEUTROABS 3.7  --   HGB 12.7 14.6  HCT 39.2 43.0  MCV 90.1  --   PLT 234  --    No results for input(s): CKTOTAL, CKMB, CKMBINDEX, TROPONINI in the last 168 hours.  Recent Labs  03/10/14 1134  LABPROT 13.4  INR 1.01    Recent Labs  03/10/14 1257  COLORURINE COLORLESS*  LABSPEC 1.006  PHURINE 7.5   GLUCOSEU NEGATIVE  HGBUR NEGATIVE  BILIRUBINUR NEGATIVE  KETONESUR NEGATIVE  PROTEINUR NEGATIVE  UROBILINOGEN 0.2  NITRITE NEGATIVE  LEUKOCYTESUR NEGATIVE       Component Value Date/Time   CHOL 178 03/11/2014 0501   TRIG 68 03/11/2014 0501   HDL 68 03/11/2014 0501   CHOLHDL 2.6 03/11/2014 0501   VLDL 14 03/11/2014 0501   LDLCALC 96 03/11/2014 0501   Lab Results  Component Value Date   HGBA1C 5.5 03/11/2014      Component Value Date/Time   LABOPIA NONE DETECTED 03/10/2014 1257   COCAINSCRNUR NONE DETECTED 03/10/2014 1257   LABBENZ NONE DETECTED 03/10/2014 1257   AMPHETMU NONE DETECTED 03/10/2014 1257   THCU NONE DETECTED 03/10/2014 1257   LABBARB NONE DETECTED 03/10/2014 1257     Recent Labs Lab 03/10/14 1134  ETH 13*    Dg Chest 2 View  03/10/2014   CLINICAL DATA:  Palpitations.  EXAM: CHEST  2 VIEW  COMPARISON:  June 24, 2013.  FINDINGS: The heart size and mediastinal contours are within normal limits. Both lungs are clear. No pneumothorax or pleural effusion is noted. The visualized skeletal structures are unremarkable.  IMPRESSION: No acute cardiopulmonary abnormality seen.   Electronically Signed   By: Dionne Ano.D.  On: 03/10/2014 12:27   Ct Head Wo Contrast  03/10/2014   CLINICAL DATA:  Hypertension. Difficulty with speech and difficulty using the hands for fine movements.  EXAM: CT HEAD WITHOUT CONTRAST  TECHNIQUE: Contiguous axial images were obtained from the base of the skull through the vertex without intravenous contrast.  COMPARISON:  None.  FINDINGS: Subtle white matter hypodensity in the left periventricular white matter, images 16 through 17 of series 2, mildly asymmetric compared to the contralateral side. Suspected small remote lacunar infarct along the upper margin of the left lentiform nucleus.  Otherwise, the brainstem, cerebellum, cerebral peduncles, thalamus, basal ganglia, basilar cisterns, and ventricular system appear within normal  limits. No intracranial hemorrhage or mass lesion observed.  IMPRESSION: 1. Slightly asymmetric left periventricular white matter hypodensity may well simply be a manifestation of chronic ischemic microvascular white matter disease. Subacute infarct is statistically less likely but not entirely excluded. MRI of the brain could differentiate, if clinically warranted. 2. Remote tiny lacunar infarct along the upper margin of the left lentiform nucleus.   Electronically Signed   By: Sherryl Barters M.D.   On: 03/10/2014 12:25   Mr Jodene Nam Head Wo Contrast  03/10/2014   CLINICAL DATA:  Sudden onset of dizziness, weakness, and slurred speech. Initial encounter.  EXAM: MRI HEAD WITHOUT CONTRAST  MRA HEAD WITHOUT CONTRAST  TECHNIQUE: Multiplanar, multiecho pulse sequences of the brain and surrounding structures were obtained without intravenous contrast. Angiographic images of the head were obtained using MRA technique without contrast.  COMPARISON:  CT head earlier today.  FINDINGS: MRI HEAD FINDINGS  Scattered foci of restricted diffusion in the LEFT periventricular white matter/ centrum semiovale, also involving the posterior limb internal capsule and posterior basal ganglia, consistent with an acute LEFT MCA lenticulostriate infarct.  No visible hemorrhage, mass lesion, hydrocephalus, or extra-axial fluid.  Normal for age cerebral volume. Mild subcortical and periventricular T2 and FLAIR hyperintensities, likely chronic microvascular ischemic change. Pituitary, pineal, and cerebellar tonsils unremarkable. No upper cervical lesions. Flow voids are maintained throughout the carotid, basilar, and vertebral arteries. There are no areas of chronic hemorrhage. Negative orbits. No sinus or mastoid disease. No osseous lesions. Scalp soft tissues unremarkable.  MRA HEAD FINDINGS  The internal carotid arteries are widely patent. The basilar artery displays a focal area of stenosis just inferior to the LEFT AICA origin, estimated  50%. Both vertebrals contribute to basilar formation, LEFT dominant. Proximal basilar fenestration is a normal variant.  There is no proximal stenosis of the anterior, middle, or posterior cerebral arteries. No cerebellar branch occlusion. No intracranial aneurysm.  IMPRESSION: Acute nonhemorrhagic LEFT MCA lenticulostriate territory infarct affecting deep white matter, posterior basal ganglia, and internal capsule.  Mild subcortical and periventricular T2 and FLAIR hyperintensities, likely chronic microvascular ischemic change.  50% mid basilar stenosis does not correlate with the observed acute pattern of ischemia, otherwise unremarkable MRA intracranial circulation.   Electronically Signed   By: Rolla Flatten M.D.   On: 03/10/2014 14:23   Mr Brain Wo Contrast  03/10/2014   CLINICAL DATA:  Sudden onset of dizziness, weakness, and slurred speech. Initial encounter.  EXAM: MRI HEAD WITHOUT CONTRAST  MRA HEAD WITHOUT CONTRAST  TECHNIQUE: Multiplanar, multiecho pulse sequences of the brain and surrounding structures were obtained without intravenous contrast. Angiographic images of the head were obtained using MRA technique without contrast.  COMPARISON:  CT head earlier today.  FINDINGS: MRI HEAD FINDINGS  Scattered foci of restricted diffusion in the LEFT periventricular white matter/ centrum  semiovale, also involving the posterior limb internal capsule and posterior basal ganglia, consistent with an acute LEFT MCA lenticulostriate infarct.  No visible hemorrhage, mass lesion, hydrocephalus, or extra-axial fluid.  Normal for age cerebral volume. Mild subcortical and periventricular T2 and FLAIR hyperintensities, likely chronic microvascular ischemic change. Pituitary, pineal, and cerebellar tonsils unremarkable. No upper cervical lesions. Flow voids are maintained throughout the carotid, basilar, and vertebral arteries. There are no areas of chronic hemorrhage. Negative orbits. No sinus or mastoid disease. No  osseous lesions. Scalp soft tissues unremarkable.  MRA HEAD FINDINGS  The internal carotid arteries are widely patent. The basilar artery displays a focal area of stenosis just inferior to the LEFT AICA origin, estimated 50%. Both vertebrals contribute to basilar formation, LEFT dominant. Proximal basilar fenestration is a normal variant.  There is no proximal stenosis of the anterior, middle, or posterior cerebral arteries. No cerebellar branch occlusion. No intracranial aneurysm.  IMPRESSION: Acute nonhemorrhagic LEFT MCA lenticulostriate territory infarct affecting deep white matter, posterior basal ganglia, and internal capsule.  Mild subcortical and periventricular T2 and FLAIR hyperintensities, likely chronic microvascular ischemic change.  50% mid basilar stenosis does not correlate with the observed acute pattern of ischemia, otherwise unremarkable MRA intracranial circulation.   Electronically Signed   By: Rolla Flatten M.D.   On: 03/10/2014 14:23   2D Echocardiogram  EF 60-65% with no source of embolus.    PHYSICAL EXAM Frail elderly caucasian lady not in distress.Awake alert. Afebrile. Head is nontraumatic. Neck is supple without bruit. Hearing is normal. Cardiac exam no murmur or gallop. Lungs are clear to auscultation. Distal pulses are well felt. Neurological Exam ;  Awake  Alert oriented x 3. Normal speech and language.eye movements full without nystagmus.fundi were not visualized. Vision acuity and fields appear normal. Hearing is normal. Palatal movements are normal. Face symmetric. Tongue midline. Normal strength, tone, reflexes and coordination. Normal sensation. Gait deferred. ASSESSMENT/PLAN [name] is a 80 y.o. female with history of hypothyroidism and hypertension presenting with discoordination in her right hand. She did not receive IV t-PA due to delay in arrival.  Stroke:  Dominant LEFT MCA lenticulostriate territory infarct secondary to small vessel disease source      Resultant right hand discoordination  MRI  LEFT MCA lenticulostriate territory infarct  MRA  incidental 50% mid basilar stenosis, otherwise unremarkable intracranial circulation.  Carotid Doppler  pending   2D Echo  No source of embolus   HgbA1c 5.5  Diet Heart thin liquids  aspirin 81 mg orally every day prior to admission continued in hospital, Petty change to clopidogrel 75 mg orally every day  Patient counseled to be compliant with her antithrombotic medications  Ongoing aggressive risk factor management  Therapy recommendations:  No therapy needs  Disposition:  home  Hypertension  Unstable  Goal 135/90. patient reports she felt very sluggish on higher doses of amlodipine. Recommend change to ACE to see if that works better for her   Patient counseled to be compliant with her blood pressure medications  Hyperlipidemia  Home meds: fish oil  LDL 96, goal < 70  Add statin - I paged Dr. Candiss Norse to add as patient has already been discharged  Continue statin at discharge  Diabetes  HgbA1c 5.5 at goal < 7.0  Controlled  Other Stroke Risk Factors Advanced age Former Cigarette smoker, stopped 50 years ago ETOH use  Hospital day # 1  Burnetta Sabin, MSN, APRN, ANVP-BC, AGPCNP-BC [facility] Pager: [phone] 03/11/2014  3:08 PM  I have personally examined this patient, reviewed notes, independently viewed imaging studies, participated in medical decision making and plan of care. I have made any additions or clarifications directly to the above note. Agree with note above. Patient's left brain infarct is likely from small vessel disease. Need to complete stroke risk stratification evaluation.  Antony Contras, MD Medical Director [facility] Pager: [phone] 03/11/2014 3:35 PM    To contact Stroke Continuity provider, please refer to [email or web address] After hours, contact General Neurology

## 2014-03-11 NOTE — Evaluation (Signed)
Physical Therapy Evaluation Patient Details Name: [name] MRN: [id number] DOB: 11/18/1931 Today's Date: 03/11/2014   History of Present Illness  Patient is a 80 y/o female who has noticed difficulty playing the violin as well as using her hand to type the last couple days. Came to the ER due to concerns. MRI brain-acute nonhemorrhagic left MCA lenticulostriate infarct involving deep white matter, posterior basal ganglia and internal capsule. Alcohol level mildly elevated at 13.      Clinical Impression  Patient presents close to functional baseline and able to tolerate ambulating community distances and performing higher level balance challenges without LOB or difficulty. Reports symptoms have resolved. Education provided on HEP to improve strengthening of BLEs, per pt request. Pt does not require skilled therapy services as pt is functioning at baseline. All questions answered. Discharge from therapy. Signing off.    Follow Up Recommendations No PT follow up    Equipment Recommendations  None recommended by PT    Recommendations for Other Services       Precautions / Restrictions Precautions Precautions: None Restrictions Weight Bearing Restrictions: No      Mobility  Bed Mobility Overal bed mobility: Modified Independent;Needs Assistance Bed Mobility: Supine to Sit;Sit to Supine     Supine to sit: Modified independent (Device/Increase time);HOB elevated Sit to supine: Modified independent (Device/Increase time);HOB elevated      Transfers Overall transfer level: Needs assistance Equipment used: None Transfers: Sit to/from Stand Sit to Stand: Independent            Ambulation/Gait Ambulation/Gait assistance: Independent Ambulation Distance (Feet): 500 Feet Assistive device: None Gait Pattern/deviations: Step-through pattern;Wide base of support;Decreased stride length     General Gait Details: Steady gait pattern even with higher level balance  challenges. Hx of knee issues, reports walking is baseline.  Stairs            Wheelchair Mobility    Modified Rankin (Stroke Patients Only) Modified Rankin (Stroke Patients Only) Pre-Morbid Rankin Score: No symptoms Modified Rankin: No symptoms     Balance Overall balance assessment: Independent;Needs assistance                           High level balance activites: Direction changes;Turns;Sudden stops;Head turns High Level Balance Comments: Able to perform above higher level balance activities without LOB or difficulty.              Pertinent Vitals/Pain Pain Assessment: No/denies pain    Home Living Family/patient expects to be discharged to:: Private residence Living Arrangements: Alone Available Help at Discharge: Family Type of Home: House Home Access: Level entry     Home Layout: One level Home Equipment: Environmental consultant - 2 wheels Additional Comments: Daughter lives next door.    Prior Function Level of Independence: Independent         Comments: Pt very active PTA - goes to the gym twice/week and hikes using poles. Plays violin.     Hand Dominance   Dominant Hand: Right    Extremity/Trunk Assessment   Upper Extremity Assessment: Defer to OT evaluation           Lower Extremity Assessment: Overall WFL for tasks assessed         Communication   Communication: No difficulties  Cognition Arousal/Alertness: Awake/alert Behavior During Therapy: WFL for tasks assessed/performed Overall Cognitive Status: Within Functional Limits for tasks assessed  General Comments General comments (skin integrity, edema, etc.): Education provided on ideas for strengthening LEs as pt notices difficulties when descending hills and standing from chair at times. Educated on HEP and functional activities to perform at home to improve strength/mobility to allow for active lifestyle.    Exercises        Assessment/Plan     PT Assessment Patent does not need any further PT services  PT Diagnosis Difficulty walking   PT Problem List    PT Treatment Interventions     PT Goals (Current goals can be found in the Care Plan section) Acute Rehab PT Goals PT Goal Formulation: All assessment and education complete, DC therapy    Frequency     Barriers to discharge        Co-evaluation               End of Session   Activity Tolerance: Patient tolerated treatment well Patient left: in bed;with call bell/phone within reach;with family/visitor present Nurse Communication: Mobility status    Functional Assessment Tool Used: Clinical judgment Functional Limitation: Mobility: Walking and moving around Mobility: Walking and Moving Around Current Status (Q8250): At least 1 percent but less than 20 percent impaired, limited or restricted Mobility: Walking and Moving Around Goal Status [phone]): 0 percent impaired, limited or restricted Mobility: Walking and Moving Around Discharge Status [phone]): 0 percent impaired, limited or restricted    Time: 6945-0388 PT Time Calculation (min): 15 min   Charges:   PT Evaluation $Initial PT Evaluation Tier I: 1 Procedure PT Treatments $Gait Training: 8-22 mins   PT G Codes:   Functional Assessment Tool Used: Clinical judgment Functional Limitation: Mobility: Walking and moving around    [place], [place] 03/11/2014, 10:26 AM Candy Sledge, PT, DPT [phone]

## 2014-03-11 NOTE — Progress Notes (Signed)
Bilateral carotid artery duplex completed:  1-39% ICA stenosis.  Vertebral artery flow is antegrade.     

## 2014-03-11 NOTE — Evaluation (Signed)
Occupational Therapy Evaluation Patient Details Name: [name] MRN: [id number] DOB: 02-Jul-1931 Today's Date: 03/11/2014    History of Present Illness Patient is a 80 y/o female who has noticed difficulty playing the violin as well as using her hand to type the last couple days. Came to the ER due to concerns. MRI brain-acute nonhemorrhagic left MCA lenticulostriate infarct involving deep white matter, posterior basal ganglia and internal capsule. Alcohol level mildly elevated at 13.     Clinical Impression   Patient evaluated by Occupational Therapy with no further acute OT needs identified. All education has been completed and the patient has no further questions. See below for any follow-up Occupational Therapy or equipment needs. OT to sign off. Thank you for referral.      Follow Up Recommendations  No OT follow up    Equipment Recommendations  None recommended by OT    Recommendations for Other Services       Precautions / Restrictions Precautions Precautions: None Restrictions Weight Bearing Restrictions: No      Mobility Bed Mobility Overal bed mobility: Modified Independent Bed Mobility: Supine to Sit;Sit to Supine     Supine to sit: Modified independent (Device/Increase time);HOB elevated Sit to supine: Modified independent (Device/Increase time);HOB elevated      Transfers Overall transfer level: Modified independent Equipment used: None Transfers: Sit to/from Stand Sit to Stand: Independent              Balance Overall balance assessment: Independent;Needs assistance                           High level balance activites: Side stepping;Backward walking;Direction changes;Turns;Sudden stops High Level Balance Comments: WFL            ADL Overall ADL's : Modified independent                                       General ADL Comments: pt able to read from newspaper, scan newspaper for items requested, writing  with pen no deficits, completed transfer over trash can to simulate tub transfer, able to locate brown drain in floor inside a brown tile (figure ground visual assessment), and able to complete series of hand fine motor exercises without deficits.      Vision                     Perception     Praxis      Pertinent Vitals/Pain Pain Assessment: No/denies pain     Hand Dominance Right   Extremity/Trunk Assessment Upper Extremity Assessment Upper Extremity Assessment: Overall WFL for tasks assessed (no fine motor deficits noted)   Lower Extremity Assessment Lower Extremity Assessment: Overall WFL for tasks assessed   Cervical / Trunk Assessment Cervical / Trunk Assessment: Normal   Communication Communication Communication: No difficulties   Cognition Arousal/Alertness: Awake/alert Behavior During Therapy: WFL for tasks assessed/performed Overall Cognitive Status: Within Functional Limits for tasks assessed                     General Comments       Exercises       Shoulder Instructions      Home Living Family/patient expects to be discharged to:: Private residence Living Arrangements: Alone Available Help at Discharge: Family Type of Home: House Home Access: Level entry  Home Layout: One level     Bathroom Shower/Tub: Teacher, early years/pre: Standard     Home Equipment: Environmental consultant - 2 wheels   Additional Comments: Daughter lives next door.      Prior Functioning/Environment Level of Independence: Independent        Comments: Pt very active PTA - goes to the gym twice/week and hikes using poles. Plays violin.    OT Diagnosis:     OT Problem List:     OT Treatment/Interventions:      OT Goals(Current goals can be found in the care plan section)    OT Frequency:     Barriers to D/C:            Co-evaluation              End of Session Equipment Utilized During Treatment: Gait belt Nurse Communication:  Mobility status  Activity Tolerance: Patient tolerated treatment well Patient left: in bed;with call bell/phone within reach   Time: 1054-1103 OT Time Calculation (min): 9 min Charges:  OT General Charges $OT Visit: 1 Procedure OT Evaluation $Initial OT Evaluation Tier I: 1 Procedure G-Codes: OT G-codes **NOT FOR INPATIENT CLASS** Functional Assessment Tool Used: clinical judgement Functional Limitation: Self care Self Care Current Status (I5038): 0 percent impaired, limited or restricted Self Care Goal Status (U8280): 0 percent impaired, limited or restricted Self Care Discharge Status (K3491): 0 percent impaired, limited or restricted  Parke Poisson B 03/11/2014, 11:11 AM Pager: [phone]

## 2014-03-14 ENCOUNTER — Encounter (HOSPITAL_COMMUNITY): Payer: Self-pay | Admitting: Emergency Medicine

## 2014-03-14 ENCOUNTER — Emergency Department (HOSPITAL_COMMUNITY)
Admission: EM | Admit: 2014-03-14 | Discharge: 2014-03-14 | Disposition: A | Discharge status: Home / Self Care | Payer: Medicare Other | Attending: Emergency Medicine | Admitting: Emergency Medicine

## 2014-03-14 DIAGNOSIS — D649 Anemia, unspecified: Secondary | ICD-10-CM | POA: Diagnosis not present

## 2014-03-14 DIAGNOSIS — I10 Essential (primary) hypertension: Secondary | ICD-10-CM | POA: Diagnosis not present

## 2014-03-14 DIAGNOSIS — H9193 Unspecified hearing loss, bilateral: Secondary | ICD-10-CM | POA: Diagnosis not present

## 2014-03-14 DIAGNOSIS — Z853 Personal history of malignant neoplasm of breast: Secondary | ICD-10-CM | POA: Diagnosis not present

## 2014-03-14 DIAGNOSIS — Z79899 Other long term (current) drug therapy: Secondary | ICD-10-CM | POA: Insufficient documentation

## 2014-03-14 DIAGNOSIS — Z7902 Long term (current) use of antithrombotics/antiplatelets: Secondary | ICD-10-CM | POA: Insufficient documentation

## 2014-03-14 DIAGNOSIS — E039 Hypothyroidism, unspecified: Secondary | ICD-10-CM | POA: Diagnosis not present

## 2014-03-14 DIAGNOSIS — Z791 Long term (current) use of non-steroidal anti-inflammatories (NSAID): Secondary | ICD-10-CM | POA: Diagnosis not present

## 2014-03-14 DIAGNOSIS — K5900 Constipation, unspecified: Secondary | ICD-10-CM | POA: Diagnosis not present

## 2014-03-14 DIAGNOSIS — Z87891 Personal history of nicotine dependence: Secondary | ICD-10-CM | POA: Diagnosis not present

## 2014-03-14 DIAGNOSIS — M1990 Unspecified osteoarthritis, unspecified site: Secondary | ICD-10-CM | POA: Diagnosis not present

## 2014-03-14 LAB — CBC WITH DIFFERENTIAL/PLATELET
Basophils Absolute: 0.1 10*3/uL (ref 0.0–0.1)
Basophils Relative: 1 % (ref 0–1)
Eosinophils Absolute: 0.2 10*3/uL (ref 0.0–0.7)
Eosinophils Relative: 3 % (ref 0–5)
HCT: 34.1 % — ABNORMAL LOW (ref 36.0–46.0)
Hemoglobin: 11.3 g/dL — ABNORMAL LOW (ref 12.0–15.0)
Lymphocytes Relative: 28 % (ref 12–46)
Lymphs Abs: 1.5 10*3/uL (ref 0.7–4.0)
MCH: 28.9 pg (ref 26.0–34.0)
MCHC: 33.1 g/dL (ref 30.0–36.0)
MCV: 87.2 fL (ref 78.0–100.0)
Monocytes Absolute: 0.7 10*3/uL (ref 0.1–1.0)
Monocytes Relative: 12 % (ref 3–12)
Neutro Abs: 3 10*3/uL (ref 1.7–7.7)
Neutrophils Relative %: 56 % (ref 43–77)
Platelets: 217 10*3/uL (ref 150–400)
RBC: 3.91 MIL/uL (ref 3.87–5.11)
RDW: 13.5 % (ref 11.5–15.5)
WBC: 5.4 10*3/uL (ref 4.0–10.5)

## 2014-03-14 LAB — COMPREHENSIVE METABOLIC PANEL
ALT: 13 U/L (ref 0–35)
AST: 18 U/L (ref 0–37)
Albumin: 3.8 g/dL (ref 3.5–5.2)
Alkaline Phosphatase: 89 U/L (ref 39–117)
Anion gap: 12 (ref 5–15)
BUN: 14 mg/dL (ref 6–23)
CO2: 26 mEq/L (ref 19–32)
Calcium: 9.2 mg/dL (ref 8.4–10.5)
Chloride: 95 mEq/L — ABNORMAL LOW (ref 96–112)
Creatinine, Ser: 0.67 mg/dL (ref 0.50–1.10)
GFR calc Af Amer: >90 mL/min (ref >90)
GFR calc non Af Amer: 81 mL/min — ABNORMAL LOW (ref >90)
Glucose, Bld: 148 mg/dL — ABNORMAL HIGH (ref 70–99)
Potassium: 4.6 mEq/L (ref 3.7–5.3)
Sodium: 133 mEq/L — ABNORMAL LOW (ref 137–147)
Total Bilirubin: 0.3 mg/dL (ref 0.3–1.2)
Total Protein: 6.6 g/dL (ref 6.0–8.3)

## 2014-03-14 NOTE — ED Provider Notes (Signed)
CSN: [id number]     Arrival date & time 03/14/14  2012 History   First MD Initiated Contact with Patient 03/14/14 2035     Chief Complaint  Patient presents with  . Hypertension     (Consider location/radiation/quality/duration/timing/severity/associated sxs/prior Treatment) HPI  Patient presents to the emergency department with  Concerns about her blood pressure.  The patient states that she was advised by her doctors that she would need to be rechecked her blood pressure got high.  The patient was concerned about her blood pressure numbers.  Patient is not having any symptoms at this time. patient denies nausea, vomiting, weakness, dizziness, blurred vision, numbness, chest pain, shortness of breath or syncope. patient states that she did not take any medications prior to arrival Past Medical History  Diagnosis Date  . Hypothyroidism 3-21- 13    tx. levothyroxine  . Blood transfusion feb 2014    post surgery some time ago  . Arthritis 07-27-11    osteoarthritis-hips/s/p bil. THA, arthritis right hand   . Hypertension   . Anemia     hx of with surgery   . Hard of hearing     both ears mild loss  . Complication of anesthesia     epinephrine - screams uncontrollably / pt does not want general anesthesia or narcotics  . Cold hands   . Osteopenia   . Constipation   . Cancer 1989    Breast cancer -s/p lt. mastectomy, some squamous cell lesions  . Fuchs' corneal dystrophy     both eyes   Past Surgical History  Procedure Laterality Date  . Ankle surgery  2005    right ankle tendon repair  . Cataract extraction, bilateral  few yrs ago    Bilateral  . Squamous cell carcinoma excision  07-27-11    x2 left shoulder  . Orif femur fracture  07/31/2011    Procedure: OPEN REDUCTION INTERNAL FIXATION (ORIF) DISTAL FEMUR FRACTURE;  Surgeon: Gearlean Alf, MD;  Location: [facility];  Service: Orthopedics;  Laterality: Right;  right femur  (c-arm)   . Hardware removal  04/05/2012    Procedure:  HARDWARE REMOVAL;  Surgeon: Gearlean Alf, MD;  Location: [facility];  Service: Orthopedics;  Laterality: Right;  Removal of Hardware Right Femur   . Total hip revision  04/10/2012    Procedure: TOTAL HIP REVISION;  Surgeon: Gearlean Alf, MD;  Location: [facility];  Service: Orthopedics;  Laterality: Right;  . Breast surgery  07-1987    lt.breast cancer intraductal cancer, mastectomy  . Joint replacement  07-27-11    Bilateral: Right THA - 04/2000, Left THA 06/2001  . Left hip replacement Left feb 2003  . Total hip revision Left 06/27/2012    Procedure: LEFT TOTAL HIP REVISION;  Surgeon: Gearlean Alf, MD;  Location: [facility];  Service: Orthopedics;  Laterality: Left;  Marland Kitchen Mastectomy  1989    left - Pt states has no restrictions on use of L arm for BP or blood draw  . Hardware removal Left 06/30/2013    Procedure: LEFT HIP HARDWARE REMOVAL OF CABLES;  Surgeon: Gearlean Alf, MD;  Location: [facility];  Service: Orthopedics;  Laterality: Left;  Marland Kitchen Melanoma removed Left     x 1  . Basal cell removed from face      3-4 times  . Abdominal hysterectomy      ovaries removed  . Hardware removal Right 08/21/2013    Procedure: RIGHT HIP HARDWARE REMOVAL;  Surgeon: Gearlean Alf, MD;  Location: [facility];  Service: Orthopedics;  Laterality: Right;   Family History  Problem Relation Age of Onset  . Hodgkin's lymphoma Mother   . Celiac disease Mother   . Melanoma Father   . Heart disease Paternal Grandmother   . Heart disease Paternal Grandfather    History  Substance Use Topics  . Smoking status: Former Smoker -- 1.00 packs/day for 10 years    Types: Cigarettes    Quit date: 05/09/1963  . Smokeless tobacco: Never Used  . Alcohol Use: 0.6 oz/week    1 Glasses of wine per week     Comment: wine daily with supper or beer   OB History    No data available     Review of Systems  All other systems negative except as documented in the HPI. All pertinent positives and negatives as reviewed in the  HPI.   Allergies  Epinephrine  Home Medications   Prior to Admission medications   Medication Sig Start Date End Date Taking? Authorizing Provider  amLODipine (NORVASC) 5 MG tablet Take 5 mg by mouth every morning.    Yes Historical Provider, MD  atorvastatin (LIPITOR) 10 MG tablet Take 1 tablet (10 mg total) by mouth daily. 03/11/14  Yes Thurnell Lose, MD  clopidogrel (PLAVIX) 75 MG tablet Take 1 tablet (75 mg total) by mouth daily. 03/11/14  Yes Thurnell Lose, MD  Hyaluronic Acid-Vitamin C (HYALURONIC ACID PO) Take 1 tablet by mouth daily.   Yes Historical Provider, MD  levothyroxine (SYNTHROID, LEVOTHROID) 125 MCG tablet Take 125 mcg by mouth daily before breakfast.   Yes Historical Provider, MD  lisinopril (PRINIVIL,ZESTRIL) 10 MG tablet Take 10 mg by mouth daily. 03/12/14 03/12/15 Yes Historical Provider, MD  methylcellulose (ARTIFICIAL TEARS) 1 % ophthalmic solution Place 1 drop into both eyes daily as needed (for dry eyes).    Yes Historical Provider, MD  Multiple Vitamins-Iron (MULTIVITAMINS WITH IRON) TABS tablet Take 1 tablet by mouth daily.    Yes Historical Provider, MD  Omega-3 Fatty Acids (FISH OIL) 1200 MG CAPS Take 1,200 mg by mouth 2 (two) times daily. Pt takes one at noon and one at night.   Yes Historical Provider, MD  OVER THE COUNTER MEDICATION Take 1 tablet by mouth every other day. Pt takes sunflower levithin supplement.  (1200 mg tablet)   Yes Historical Provider, MD  Vitamin D, Cholecalciferol, 1000 UNITS TABS Take 1 tablet by mouth daily with supper.   Yes Historical Provider, MD  CALCIUM-VITAMIN D PO Take 1 tablet by mouth daily.    Historical Provider, MD  naproxen sodium (ANAPROX) 220 MG tablet Take 220 mg by mouth daily as needed (for pain).     Historical Provider, MD   BP 169/80 mmHg  Pulse 56  Temp(Src) 97.4 F (36.3 C) (Oral)  Resp 19  SpO2 100% Physical Exam  Constitutional: She is oriented to person, place, and time. She appears well-developed and  well-nourished. No distress.  HENT:  Head: Normocephalic and atraumatic.  Mouth/Throat: Oropharynx is clear and moist.  Eyes: Pupils are equal, round, and reactive to light.  Neck: Normal range of motion. Neck supple.  Cardiovascular: Normal rate, regular rhythm and normal heart sounds.  Exam reveals no gallop and no friction rub.   No murmur heard. Pulmonary/Chest: Effort normal and breath sounds normal. No respiratory distress.  Neurological: She is alert and oriented to person, place, and time. She exhibits normal muscle tone. Coordination normal.  Skin: Skin is warm and dry. No rash noted. No erythema.  Nursing note and vitals reviewed.   ED Course  Procedures (including critical care time) Labs Review Labs Reviewed  COMPREHENSIVE METABOLIC PANEL - Abnormal; Notable for the following:    Sodium 133 (*)    Chloride 95 (*)    Glucose, Bld 148 (*)    GFR calc non Af Amer 81 (*)    All other components within normal limits  CBC WITH DIFFERENTIAL - Abnormal; Notable for the following:    Hemoglobin 11.3 (*)    HCT 34.1 (*)    All other components within normal limits  URINE CULTURE  URINALYSIS, ROUTINE W REFLEX MICROSCOPIC    Imaging Review No results found.   EKG Interpretation   Date/Time:  Saturday March 14 2014 21:55:53 EST Ventricular Rate:  54 PR Interval:  167 QRS Duration: 84 QT Interval:  447 QTC Calculation: 424 R Axis:   63 Text Interpretation:  Sinus rhythm Left ventricular hypertrophy Anterior Q  waves, possibly due to LVH Sinus rhythm Left ventricular hypertrophy  anterior Q waves Abnormal ekg Confirmed by Carmin Muskrat  MD [phone]) on  03/14/2014 10:00:51 PM      MDM   Final diagnoses:  None     The patient will be referred back to her primary care doctor, told to return here as needed. The patient does not have any current symptoms with her blood pressure  Brent General, PA-C 03/15/14 [place], MD 03/15/14 1536

## 2014-03-14 NOTE — Discharge Instructions (Signed)
REturn here as needed. Follow up with your doctor for recheck

## 2014-03-14 NOTE — ED Notes (Addendum)
No neuro deficits noted in triage  

## 2014-03-14 NOTE — ED Notes (Signed)
Patient presents with complaint of high blood pressure and tremors. Blood pressure readings at home reported to be widely varied but today have been as high as 155 systolic. Recent admission for stroke with discharge on Wednesday. Comes in tonight because she is concerned about having another stroke because she was told the previous one was do to hypertension.

## 2014-03-14 NOTE — ED Notes (Signed)
DR Vanita Panda does not want the urine sample.

## 2014-03-17 ENCOUNTER — Encounter: Payer: Self-pay | Admitting: Neurology

## 2014-03-17 ENCOUNTER — Ambulatory Visit (INDEPENDENT_AMBULATORY_CARE_PROVIDER_SITE_OTHER): Payer: Medicare Other | Admitting: Neurology

## 2014-03-17 VITALS — BP 174/73 | HR 56 | Ht 66.75 in | Wt 125.4 lb

## 2014-03-17 DIAGNOSIS — I639 Cerebral infarction, unspecified: Secondary | ICD-10-CM

## 2014-03-17 NOTE — Patient Instructions (Signed)
I had a long discussion with the patient with regards to her recent stroke, posterior is reviewed and discussed imaging and others stroke studies and answered questions. Continue Plavix for stroke prevention with strict control of hypertension with blood pressure goes below 130/90 and lipids with LDL cholesterol goal below 70. 0 mg percent. I encouraged the patient to increase participation in stress relaxation activities to lower her anxiety. Refer to Dr. Crissie Sickles for loop recorder insertion for paroxysmal atrial fibrillation detection given her prior history of irregular heart rhythm.She may consider changing her lisinopril to evening dose given her increased blood pressure at night. Return for follow-up in 3 months or call earlier if necessary.  Stroke Prevention Some medical conditions and behaviors are associated with an increased chance of having a stroke. You may prevent a stroke by making healthy choices and managing medical conditions. HOW CAN I REDUCE MY RISK OF HAVING A STROKE?   Stay physically active. Get at least 30 minutes of activity on most or all days.  Do not smoke. It may also be helpful to avoid exposure to secondhand smoke.  Limit alcohol use. Moderate alcohol use is considered to be:  No more than 2 drinks per day for men.  No more than 1 drink per day for nonpregnant women.  Eat healthy foods. This involves:  Eating 5 or more servings of fruits and vegetables a day.  Making dietary changes that address high blood pressure (hypertension), high cholesterol, diabetes, or obesity.  Manage your cholesterol levels.  Making food choices that are high in fiber and low in saturated fat, trans fat, and cholesterol may control cholesterol levels.  Take any prescribed medicines to control cholesterol as directed by your health care provider.  Manage your diabetes.  Controlling your carbohydrate and sugar intake is recommended to manage diabetes.  Take any prescribed  medicines to control diabetes as directed by your health care provider.  Control your hypertension.  Making food choices that are low in salt (sodium), saturated fat, trans fat, and cholesterol is recommended to manage hypertension.  Take any prescribed medicines to control hypertension as directed by your health care provider.  Maintain a healthy weight.  Reducing calorie intake and making food choices that are low in sodium, saturated fat, trans fat, and cholesterol are recommended to manage weight.  Stop drug abuse.  Avoid taking birth control pills.  Talk to your health care provider about the risks of taking birth control pills if you are over 24 years old, smoke, get migraines, or have ever had a blood clot.  Get evaluated for sleep disorders (sleep apnea).  Talk to your health care provider about getting a sleep evaluation if you snore a lot or have excessive sleepiness.  Take medicines only as directed by your health care provider.  For some people, aspirin or blood thinners (anticoagulants) are helpful in reducing the risk of forming abnormal blood clots that can lead to stroke. If you have the irregular heart rhythm of atrial fibrillation, you should be on a blood thinner unless there is a good reason you cannot take them.  Understand all your medicine instructions.  Make sure that other conditions (such as anemia or atherosclerosis) are addressed. SEEK IMMEDIATE MEDICAL CARE IF:   You have sudden weakness or numbness of the face, arm, or leg, especially on one side of the body.  Your face or eyelid droops to one side.  You have sudden confusion.  You have trouble speaking (aphasia) or understanding.  You have sudden trouble seeing in one or both eyes.  You have sudden trouble walking.  You have dizziness.  You have a loss of balance or coordination.  You have a sudden, severe headache with no known cause.  You have new chest pain or an irregular  heartbeat. Any of these symptoms may represent a serious problem that is an emergency. Do not wait to see if the symptoms will go away. Get medical help at once. Call your local emergency services (911 in [place] Do not drive yourself to the hospital. Document Released: 06/01/2004 Document Revised: 09/08/2013 Document Reviewed: 10/25/2012 [employer] Patient Information 2015 [place], [place]. This information is not intended to replace advice given to you by your health care provider. Make sure you discuss any questions you have with your health care provider.

## 2014-03-17 NOTE — Progress Notes (Signed)
[facility] [address] [place]. [place] [address] [phone]       OFFICE FOLLOW-UP NOTE  [name] Date of Birth:  [birth year] Medical Record Number:  [id number]   HPI: 31 year Caucasian lady seen today for first office follow-up visit following hospital admission for stroke on 03/10/2014.she presented with difficulty playing the violin as well as using her right hand to type for a couple of days. MRI scan of the brain showed a large left basal ganglia infarct measuring almost 2 cm and extending into the corona radiata. She also had some speech and word finding difficulties. She has known history of irregular heartbeats but has never been diagnosed with a cardiac arrhythmia. Transthoracic echo showed normal ejection fraction. Carotid ultrasound showed no significant extracranial stenosis. I personally reviewed the MRI scan and MRA which showed only mild irregularity of the basilar and anterior inferior cerebellar artery which were unrelated to her strokes.lipid profile showed total cholesterol 178, triglycerides 68, HDL 68 and LDL 96 mg percent.hemoglobin A1c was 5.5. Urine drug screen was negative.blood alcohol level was minimally elevated at 13. She admitted to drinking a glass of wine every night but denied heavy drinking. Patient was started on amlodipine and lisinopril for blood pressure and Lipitor for lipids and Plavix for stroke prevention. She states she is tolerating Plavix and Lipitor fairly well but she has been quite anxious about her blood pressure. She has recorded high blood pressure in the 170s and 180s on several occasions and in fact she panicked and called 911 couple of days ago. She seems quite anxious and has trouble controlling her anxiety. She has an appointment to see cardiologist Dr. Crissie Sickles at the end of the month to discuss her irregular heart rhythm. She did have telemetry monitoring in the hospital for 2 days but no significant arrhythmias  were detected.she has noted complete improvement in her speech and her fine motor skills in the right hand have also improved. She can type much better and her playing music is improving but not back to baseline yet.  ROS:   14 system review of systems is positive for anxiety, but rotations, irregular heart rhythm only and all other systems negative  PMH:  Past Medical History  Diagnosis Date  . Hypothyroidism 3-21- 13    tx. levothyroxine  . Blood transfusion feb 2014    post surgery some time ago  . Arthritis 07-27-11    osteoarthritis-hips/s/p bil. THA, arthritis right hand   . Hypertension   . Anemia     hx of with surgery   . Hard of hearing     both ears mild loss  . Complication of anesthesia     epinephrine - screams uncontrollably / pt does not want general anesthesia or narcotics  . Cold hands   . Osteopenia   . Constipation   . Cancer 1989    Breast cancer -s/p lt. mastectomy, some squamous cell lesions  . Fuchs' corneal dystrophy     both eyes    Social History:  History   Social History  . Marital Status: Single    Spouse Name: N/A    Number of Children: 1  . Years of Education: N/A   Occupational History  . Retired    Social History Main Topics  . Smoking status: Former Smoker -- 1.00 packs/day for 10 years    Types: Cigarettes    Quit date: 05/09/1963  . Smokeless tobacco: Never Used  . Alcohol  Use: 0.6 oz/week    1 Glasses of wine per week     Comment: wine daily with supper or beer  . Drug Use: No  . Sexual Activity: No   Other Topics Concern  . Not on file   Social History Narrative   Lives alone.    Medications:   Current Outpatient Prescriptions on File Prior to Visit  Medication Sig Dispense Refill  . amLODipine (NORVASC) 5 MG tablet Take 5 mg by mouth every morning.     Marland Kitchen atorvastatin (LIPITOR) 10 MG tablet Take 1 tablet (10 mg total) by mouth daily. 30 tablet 0  . clopidogrel (PLAVIX) 75 MG tablet Take 1 tablet (75 mg total) by  mouth daily. 30 tablet 0  . Hyaluronic Acid-Vitamin C (HYALURONIC ACID PO) Take 1 tablet by mouth daily.    Marland Kitchen levothyroxine (SYNTHROID, LEVOTHROID) 125 MCG tablet Take 125 mcg by mouth daily before breakfast.    . lisinopril (PRINIVIL,ZESTRIL) 10 MG tablet Take 10 mg by mouth daily.    . methylcellulose (ARTIFICIAL TEARS) 1 % ophthalmic solution Place 1 drop into both eyes daily as needed (for dry eyes).     . Multiple Vitamins-Iron (MULTIVITAMINS WITH IRON) TABS tablet Take 1 tablet by mouth daily.     . Omega-3 Fatty Acids (FISH OIL) 1200 MG CAPS Take 1,200 mg by mouth 2 (two) times daily. Pt takes one at noon and one at night.    Marland Kitchen OVER THE COUNTER MEDICATION Take 1 tablet by mouth every other day. Pt takes sunflower levithin supplement.  (1200 mg tablet)    . Vitamin D, Cholecalciferol, 1000 UNITS TABS Take 1 tablet by mouth daily with supper.     No current facility-administered medications on file prior to visit.    Allergies:   Allergies  Allergen Reactions  . Epinephrine Other (See Comments)     Reaction:  Caused pt to scream uncontrollably.    Physical Exam General: frail elderly Caucasian lady, seated, in no evident distress.she is quite anxious Head: head normocephalic and atraumatic.  Neck: supple with no carotid or supraclavicular bruits Cardiovascular: regular rate and rhythm, no murmurs Musculoskeletal: no deformity Skin:  no rash/petichiae Vascular:  Normal pulses all extremities Filed Vitals:   03/17/14 0904  BP: 174/73  Pulse: 56   Neurologic Exam Mental Status: Awake and fully alert. Oriented to place and time. Recent and remote memory intact. Attention span, concentration and fund of knowledge appropriate. Mood and affect appropriate.  Cranial Nerves: Fundoscopic exam reveals sharp disc margins. Pupils equal, briskly reactive to light. Extraocular movements full without nystagmus. Visual fields full to confrontation. Hearing intact. Facial sensation intact.  Face, tongue, palate moves normally and symmetrically.  Motor: Normal bulk and tone. Normal strength in all tested extremity muscles.diminished fine finger movements on the right. Orbits left over right upper extremity. Sensory.: intact to touch ,pinprick .position and vibratory sensation.  Coordination: Rapid alternating movements normal in all extremities. Finger-to-nose and heel-to-shin performed accurately bilaterally. Gait and Station: Arises from chair without difficulty. Stance is normal. Gait demonstrates normal stride length and balance . Able to heel, toe and tandem walk without difficulty.  Reflexes: 1+ and symmetric. Toes downgoing.   NIHSS  0 Modified Rankin  1   ASSESSMENT: 19 year Caucasian lady with left basal ganglia infarct in November 2015 in lenticulostriate distribution possibly from small vessel disease but large size of the infarct would also make cryptogenic stroke of embolic etiology also possibility. She has history of irregular  heartbeat and paroxysmal atrial fibrillation is also a consideration    PLAN: I had a long discussion with the patient with regards to her recent stroke, posterior is reviewed and discussed imaging and others stroke studies and answered questions. Continue Plavix for stroke prevention with strict control of hypertension with blood pressure goes below 130/90 and lipids with LDL cholesterol goal below 70. 0 mg percent. I encouraged the patient to increase participation in stress relaxation activities to lower her anxiety. Refer to Dr. Crissie Sickles for loop recorder insertion for paroxysmal atrial fibrillation detection given her prior history of irregular heart rhythm.She may consider changing her lisinopril to evening dose given her increased blood pressure at night. Return for follow-up in 3 months or call earlier if necessary.May consider RESPECT ESUS trial if her anxiety is under control and she is willing    Note: This document was prepared  with digital dictation and possible smart phrase technology. Any transcriptional errors that result from this process are unintentional

## 2014-04-04 ENCOUNTER — Other Ambulatory Visit: Payer: Self-pay | Admitting: Internal Medicine

## 2014-04-06 ENCOUNTER — Ambulatory Visit (INDEPENDENT_AMBULATORY_CARE_PROVIDER_SITE_OTHER): Payer: Medicare Other | Admitting: Internal Medicine

## 2014-04-06 ENCOUNTER — Encounter: Payer: Self-pay | Admitting: Internal Medicine

## 2014-04-06 VITALS — BP 136/78 | HR 62 | Ht 65.5 in | Wt 126.0 lb

## 2014-04-06 DIAGNOSIS — I10 Essential (primary) hypertension: Secondary | ICD-10-CM

## 2014-04-06 DIAGNOSIS — E038 Other specified hypothyroidism: Secondary | ICD-10-CM

## 2014-04-06 DIAGNOSIS — I639 Cerebral infarction, unspecified: Secondary | ICD-10-CM

## 2014-04-06 NOTE — Assessment & Plan Note (Signed)
Her blood pressure is fairly well controlled today. She'll continue her current medications for now. She does not need to lose weight. She is encouraged to maintain a low-sodium diet.

## 2014-04-06 NOTE — Assessment & Plan Note (Signed)
No evidence of clinical thyroid dysfunction today. She does not have a goiter. She will need her TSH followed.

## 2014-04-06 NOTE — Assessment & Plan Note (Signed)
The etiology of the patient's stroke is unclear. I recommended insertion of an implantable loop recorder. The risk and benefits, goals and expectations, of the procedure have been discussed in detail with the patient, and she wishes to proceed.

## 2014-04-06 NOTE — Patient Instructions (Signed)
LINQ Implant scheduled for 04/08/14    Check in at  [facility] Entrance---at 12noon

## 2014-04-06 NOTE — Progress Notes (Signed)
HPI [name] is referred today by Dr. Leonie Man for consideration for insertion of an implantable loop recorder. The patient sustained a cerebellar stroke several weeks ago. Subsequent evaluation was unrevealing. The patient was initially bothered by fine motor skill deficits, especially playing the violin. She has subsequently recovered. Her other medical problems include hypertension, and she has evidence of diastolic dysfunction by echo. She remains hypertensive. She had mild to moderate left atrial enlargement by echo. She is currently been on Plavix for prevention of stroke. She has not had syncope. She denies peripheral edema. Allergies  Allergen Reactions  . Epinephrine Other (See Comments)     Reaction:  Caused pt to scream uncontrollably.     Current Outpatient Prescriptions  Medication Sig Dispense Refill  . amLODipine (NORVASC) 5 MG tablet Take 5 mg by mouth every morning.     Marland Kitchen atorvastatin (LIPITOR) 10 MG tablet Take 1 tablet (10 mg total) by mouth daily. 30 tablet 0  . clopidogrel (PLAVIX) 75 MG tablet Take 1 tablet (75 mg total) by mouth daily. 30 tablet 0  . Hyaluronic Acid-Vitamin C (HYALURONIC ACID PO) Take 1 tablet by mouth daily.    Marland Kitchen levothyroxine (SYNTHROID, LEVOTHROID) 125 MCG tablet Take 125 mcg by mouth daily before breakfast.    . lisinopril (PRINIVIL,ZESTRIL) 10 MG tablet Take 10 mg by mouth daily.    . methylcellulose (ARTIFICIAL TEARS) 1 % ophthalmic solution Place 1 drop into both eyes daily as needed (for dry eyes).     . Multiple Vitamins-Iron (MULTIVITAMINS WITH IRON) TABS tablet Take 1 tablet by mouth daily.     . Omega-3 Fatty Acids (FISH OIL) 1200 MG CAPS Take 1,200 mg by mouth 2 (two) times daily. Pt takes one at noon and one at night.    Marland Kitchen OVER THE COUNTER MEDICATION Take 1 tablet by mouth every other day. Pt takes sunflower levithin supplement.  (1200 mg tablet)    . Vitamin D, Cholecalciferol, 1000 UNITS TABS Take 1 tablet by mouth daily with  supper.     No current facility-administered medications for this visit.     Past Medical History  Diagnosis Date  . Hypothyroidism 3-21- 13    tx. levothyroxine  . Blood transfusion feb 2014    post surgery some time ago  . Arthritis 07-27-11    osteoarthritis-hips/s/p bil. THA, arthritis right hand   . Hypertension   . Anemia     hx of with surgery   . Hard of hearing     both ears mild loss  . Complication of anesthesia     epinephrine - screams uncontrollably / pt does not want general anesthesia or narcotics  . Cold hands   . Osteopenia   . Constipation   . Cancer 1989    Breast cancer -s/p lt. mastectomy, some squamous cell lesions  . Fuchs' corneal dystrophy     both eyes    ROS:   All systems reviewed and negative except as noted in the HPI.   Past Surgical History  Procedure Laterality Date  . Ankle surgery  2005    right ankle tendon repair  . Cataract extraction, bilateral  few yrs ago    Bilateral  . Squamous cell carcinoma excision  07-27-11    x2 left shoulder  . Orif femur fracture  07/31/2011    Procedure: OPEN REDUCTION INTERNAL FIXATION (ORIF) DISTAL FEMUR FRACTURE;  Surgeon: Gearlean Alf, MD;  Location: [facility];  Service: Orthopedics;  Laterality: Right;  right femur  (c-arm)   . Hardware removal  04/05/2012    Procedure: HARDWARE REMOVAL;  Surgeon: Gearlean Alf, MD;  Location: [facility];  Service: Orthopedics;  Laterality: Right;  Removal of Hardware Right Femur   . Total hip revision  04/10/2012    Procedure: TOTAL HIP REVISION;  Surgeon: Gearlean Alf, MD;  Location: [facility];  Service: Orthopedics;  Laterality: Right;  . Breast surgery  07-1987    lt.breast cancer intraductal cancer, mastectomy  . Joint replacement  07-27-11    Bilateral: Right THA - 04/2000, Left THA 06/2001  . Left hip replacement Left feb 2003  . Total hip revision Left 06/27/2012    Procedure: LEFT TOTAL HIP REVISION;  Surgeon: Gearlean Alf, MD;  Location: [facility];   Service: Orthopedics;  Laterality: Left;  Marland Kitchen Mastectomy  1989    left - Pt states has no restrictions on use of L arm for BP or blood draw  . Hardware removal Left 06/30/2013    Procedure: LEFT HIP HARDWARE REMOVAL OF CABLES;  Surgeon: Gearlean Alf, MD;  Location: [facility];  Service: Orthopedics;  Laterality: Left;  Marland Kitchen Melanoma removed Left     x 1  . Basal cell removed from face      3-4 times  . Abdominal hysterectomy      ovaries removed  . Hardware removal Right 08/21/2013    Procedure: RIGHT HIP HARDWARE REMOVAL;  Surgeon: Gearlean Alf, MD;  Location: [facility];  Service: Orthopedics;  Laterality: Right;     Family History  Problem Relation Age of Onset  . Hodgkin's lymphoma Mother   . Celiac disease Mother   . Melanoma Father   . Heart disease Paternal Grandmother   . Heart disease Paternal Grandfather      History   Social History  . Marital Status: Single    Spouse Name: N/A    Number of Children: 1  . Years of Education: N/A   Occupational History  . Retired    Social History Main Topics  . Smoking status: Former Smoker -- 1.00 packs/day for 10 years    Types: Cigarettes    Quit date: 05/09/1963  . Smokeless tobacco: Never Used  . Alcohol Use: 0.6 oz/week    1 Glasses of wine per week     Comment: wine daily with supper or beer  . Drug Use: No  . Sexual Activity: No   Other Topics Concern  . Not on file   Social History Narrative   Lives alone.     BP 136/78 mmHg  Pulse 62  Ht 5' 5.5" (1.664 m)  Wt 126 lb (57.153 kg)  BMI 20.64 kg/m2  Physical Exam:  Well appearing 80 year old woman, NAD HEENT: Unremarkable Neck:  6 cm JVD, no thyromegally Back:  No CVA tenderness Lungs:  Clear with no wheezes, rales, or rhonchi. HEART:  Regular rate rhythm, no murmurs, no rubs, no clicks Abd:  soft, positive bowel sounds, no organomegally, no rebound, no guarding Ext:  2 plus pulses, no edema, no cyanosis, no clubbing Skin:  No rashes no nodules Neuro:   CN II through XII intact, motor grossly intact  EKG - normal sinus rhythm   Assess/Plan:

## 2014-04-07 ENCOUNTER — Encounter (HOSPITAL_COMMUNITY): Payer: Self-pay | Admitting: Pharmacy Technician

## 2014-04-08 ENCOUNTER — Ambulatory Visit (HOSPITAL_COMMUNITY)
Admission: RE | Admit: 2014-04-08 | Discharge: 2014-04-08 | Disposition: A | Discharge status: Home / Self Care | Payer: Medicare Other | Source: Ambulatory Visit | Attending: Internal Medicine | Admitting: Internal Medicine

## 2014-04-08 ENCOUNTER — Encounter (HOSPITAL_COMMUNITY)
Admission: RE | Disposition: A | Discharge status: Home / Self Care | Payer: Self-pay | Source: Ambulatory Visit | Attending: Internal Medicine

## 2014-04-08 DIAGNOSIS — Z79899 Other long term (current) drug therapy: Secondary | ICD-10-CM | POA: Insufficient documentation

## 2014-04-08 DIAGNOSIS — I639 Cerebral infarction, unspecified: Secondary | ICD-10-CM

## 2014-04-08 DIAGNOSIS — I10 Essential (primary) hypertension: Secondary | ICD-10-CM | POA: Insufficient documentation

## 2014-04-08 DIAGNOSIS — Z96643 Presence of artificial hip joint, bilateral: Secondary | ICD-10-CM | POA: Insufficient documentation

## 2014-04-08 DIAGNOSIS — Z7902 Long term (current) use of antithrombotics/antiplatelets: Secondary | ICD-10-CM | POA: Insufficient documentation

## 2014-04-08 DIAGNOSIS — Z853 Personal history of malignant neoplasm of breast: Secondary | ICD-10-CM | POA: Diagnosis not present

## 2014-04-08 DIAGNOSIS — E039 Hypothyroidism, unspecified: Secondary | ICD-10-CM | POA: Insufficient documentation

## 2014-04-08 DIAGNOSIS — Z8673 Personal history of transient ischemic attack (TIA), and cerebral infarction without residual deficits: Secondary | ICD-10-CM | POA: Insufficient documentation

## 2014-04-08 DIAGNOSIS — Z87891 Personal history of nicotine dependence: Secondary | ICD-10-CM | POA: Diagnosis not present

## 2014-04-08 HISTORY — PX: LOOP RECORDER IMPLANT: SHX5954

## 2014-04-08 HISTORY — DX: Cerebral infarction, unspecified: I63.9

## 2014-04-08 SURGERY — LOOP RECORDER IMPLANT
Anesthesia: LOCAL

## 2014-04-08 MED ORDER — LIDOCAINE HCL (PF) 1 % IJ SOLN
INTRAMUSCULAR | Status: AC
  Filled 2014-04-08: qty 30

## 2014-04-08 MED ORDER — LIDOCAINE-EPINEPHRINE 1 %-1:100000 IJ SOLN
INTRAMUSCULAR | Status: AC
  Filled 2014-04-08: qty 1

## 2014-04-08 NOTE — CV Procedure (Signed)
EP Procedure Note  Procedure: Insertion of an ILR  Indication: cryptogenic stroke  Pre-procedure Diagnosis: cryptogenic stroke  Post-procedure Diagnosis: Same as preprocedure diagnosis  Description of the procedure: After informed consent was obtained, the patient was prepped and draped in a sterile fashion. 20 cc of lidocaine was infiltrated and a one cm stab incision made. The Medtronic ILR, serial [id number] was inserted into the subcutaneous space. R waves measured 0.7 mV. Benzoin and steristrips were painted on the skin and the patient recovered in the usual manner.   Complications: none immediately.  Conclusion: successful insertion of a Medtronic ILR in a patient with cryptogenic stroke.  Mikle Bosworth.D.

## 2014-04-08 NOTE — H&P (View-Only) (Signed)
HPI [name] is referred today by Dr. Leonie Man for consideration for insertion of an implantable loop recorder. The patient sustained a cerebellar stroke several weeks ago. Subsequent evaluation was unrevealing. The patient was initially bothered by fine motor skill deficits, especially playing the violin. She has subsequently recovered. Her other medical problems include hypertension, and she has evidence of diastolic dysfunction by echo. She remains hypertensive. She had mild to moderate left atrial enlargement by echo. She is currently been on Plavix for prevention of stroke. She has not had syncope. She denies peripheral edema. Allergies  Allergen Reactions  . Epinephrine Other (See Comments)     Reaction:  Caused pt to scream uncontrollably.     Current Outpatient Prescriptions  Medication Sig Dispense Refill  . amLODipine (NORVASC) 5 MG tablet Take 5 mg by mouth every morning.     Marland Kitchen atorvastatin (LIPITOR) 10 MG tablet Take 1 tablet (10 mg total) by mouth daily. 30 tablet 0  . clopidogrel (PLAVIX) 75 MG tablet Take 1 tablet (75 mg total) by mouth daily. 30 tablet 0  . Hyaluronic Acid-Vitamin C (HYALURONIC ACID PO) Take 1 tablet by mouth daily.    Marland Kitchen levothyroxine (SYNTHROID, LEVOTHROID) 125 MCG tablet Take 125 mcg by mouth daily before breakfast.    . lisinopril (PRINIVIL,ZESTRIL) 10 MG tablet Take 10 mg by mouth daily.    . methylcellulose (ARTIFICIAL TEARS) 1 % ophthalmic solution Place 1 drop into both eyes daily as needed (for dry eyes).     . Multiple Vitamins-Iron (MULTIVITAMINS WITH IRON) TABS tablet Take 1 tablet by mouth daily.     . Omega-3 Fatty Acids (FISH OIL) 1200 MG CAPS Take 1,200 mg by mouth 2 (two) times daily. Pt takes one at noon and one at night.    Marland Kitchen OVER THE COUNTER MEDICATION Take 1 tablet by mouth every other day. Pt takes sunflower levithin supplement.  (1200 mg tablet)    . Vitamin D, Cholecalciferol, 1000 UNITS TABS Take 1 tablet by mouth daily with  supper.     No current facility-administered medications for this visit.     Past Medical History  Diagnosis Date  . Hypothyroidism 3-21- 13    tx. levothyroxine  . Blood transfusion feb 2014    post surgery some time ago  . Arthritis 07-27-11    osteoarthritis-hips/s/p bil. THA, arthritis right hand   . Hypertension   . Anemia     hx of with surgery   . Hard of hearing     both ears mild loss  . Complication of anesthesia     epinephrine - screams uncontrollably / pt does not want general anesthesia or narcotics  . Cold hands   . Osteopenia   . Constipation   . Cancer 1989    Breast cancer -s/p lt. mastectomy, some squamous cell lesions  . Fuchs' corneal dystrophy     both eyes    ROS:   All systems reviewed and negative except as noted in the HPI.   Past Surgical History  Procedure Laterality Date  . Ankle surgery  2005    right ankle tendon repair  . Cataract extraction, bilateral  few yrs ago    Bilateral  . Squamous cell carcinoma excision  07-27-11    x2 left shoulder  . Orif femur fracture  07/31/2011    Procedure: OPEN REDUCTION INTERNAL FIXATION (ORIF) DISTAL FEMUR FRACTURE;  Surgeon: Gearlean Alf, MD;  Location: [facility];  Service: Orthopedics;  Laterality: Right;  right femur  (c-arm)   . Hardware removal  04/05/2012    Procedure: HARDWARE REMOVAL;  Surgeon: Gearlean Alf, MD;  Location: [facility];  Service: Orthopedics;  Laterality: Right;  Removal of Hardware Right Femur   . Total hip revision  04/10/2012    Procedure: TOTAL HIP REVISION;  Surgeon: Gearlean Alf, MD;  Location: [facility];  Service: Orthopedics;  Laterality: Right;  . Breast surgery  07-1987    lt.breast cancer intraductal cancer, mastectomy  . Joint replacement  07-27-11    Bilateral: Right THA - 04/2000, Left THA 06/2001  . Left hip replacement Left feb 2003  . Total hip revision Left 06/27/2012    Procedure: LEFT TOTAL HIP REVISION;  Surgeon: Gearlean Alf, MD;  Location: [facility];   Service: Orthopedics;  Laterality: Left;  Marland Kitchen Mastectomy  1989    left - Pt states has no restrictions on use of L arm for BP or blood draw  . Hardware removal Left 06/30/2013    Procedure: LEFT HIP HARDWARE REMOVAL OF CABLES;  Surgeon: Gearlean Alf, MD;  Location: [facility];  Service: Orthopedics;  Laterality: Left;  Marland Kitchen Melanoma removed Left     x 1  . Basal cell removed from face      3-4 times  . Abdominal hysterectomy      ovaries removed  . Hardware removal Right 08/21/2013    Procedure: RIGHT HIP HARDWARE REMOVAL;  Surgeon: Gearlean Alf, MD;  Location: [facility];  Service: Orthopedics;  Laterality: Right;     Family History  Problem Relation Age of Onset  . Hodgkin's lymphoma Mother   . Celiac disease Mother   . Melanoma Father   . Heart disease Paternal Grandmother   . Heart disease Paternal Grandfather      History   Social History  . Marital Status: Single    Spouse Name: N/A    Number of Children: 1  . Years of Education: N/A   Occupational History  . Retired    Social History Main Topics  . Smoking status: Former Smoker -- 1.00 packs/day for 10 years    Types: Cigarettes    Quit date: 05/09/1963  . Smokeless tobacco: Never Used  . Alcohol Use: 0.6 oz/week    1 Glasses of wine per week     Comment: wine daily with supper or beer  . Drug Use: No  . Sexual Activity: No   Other Topics Concern  . Not on file   Social History Narrative   Lives alone.     BP 136/78 mmHg  Pulse 62  Ht 5' 5.5" (1.664 m)  Wt 126 lb (57.153 kg)  BMI 20.64 kg/m2  Physical Exam:  Well appearing 80 year old woman, NAD HEENT: Unremarkable Neck:  6 cm JVD, no thyromegally Back:  No CVA tenderness Lungs:  Clear with no wheezes, rales, or rhonchi. HEART:  Regular rate rhythm, no murmurs, no rubs, no clicks Abd:  soft, positive bowel sounds, no organomegally, no rebound, no guarding Ext:  2 plus pulses, no edema, no cyanosis, no clubbing Skin:  No rashes no nodules Neuro:   CN II through XII intact, motor grossly intact  EKG - normal sinus rhythm   Assess/Plan:

## 2014-04-08 NOTE — Interval H&P Note (Signed)
History and Physical Interval Note:  04/08/2014 12:00 PM  [name]  has presented today for surgery, with the diagnosis of stroke  The various methods of treatment have been discussed with the patient and family. After consideration of risks, benefits and other options for treatment, the patient has consented to  Procedure(s): LOOP RECORDER IMPLANT (N/A) as a surgical intervention .  The patient's history has been reviewed, patient examined, no change in status, stable for surgery.  I have reviewed the patient's chart and labs.  Questions were answered to the patient's satisfaction.     Mikle Bosworth.D.

## 2014-04-08 NOTE — Discharge Instructions (Signed)
Supplemental discharge instructions for loop recorder insertion given.

## 2014-04-09 ENCOUNTER — Encounter (HOSPITAL_COMMUNITY): Payer: Self-pay | Admitting: *Deleted

## 2014-04-13 ENCOUNTER — Telehealth: Payer: Self-pay | Admitting: Internal Medicine

## 2014-04-13 NOTE — Telephone Encounter (Signed)
New problem   Pt want to know if we have receiving her messages from her heart monitor.

## 2014-04-13 NOTE — Telephone Encounter (Signed)
Informed pt that transmission was received and to keep bandage on until her schedule wound check appt. 04-20-14

## 2014-04-20 ENCOUNTER — Ambulatory Visit (INDEPENDENT_AMBULATORY_CARE_PROVIDER_SITE_OTHER): Payer: Medicare Other | Admitting: *Deleted

## 2014-04-20 DIAGNOSIS — I639 Cerebral infarction, unspecified: Secondary | ICD-10-CM

## 2014-04-20 LAB — MDC_IDC_ENUM_SESS_TYPE_INCLINIC
Date Time Interrogation Session: 20151214155248
MDC IDC SET ZONE DETECTION INTERVAL: 2000 ms
Zone Setting Detection Interval: 3000 ms
Zone Setting Detection Interval: 400 ms

## 2014-04-20 NOTE — Progress Notes (Signed)
Wound check s/p ILR implant. Wound well healed without redness or edema.  Pt with 0 tachy episodes; 0 brady episodes; 0 asystole; 0 AF episodes; 0 symtom episodes.   Plan to follow up via [facility] and with [facility] PRN.

## 2014-04-24 ENCOUNTER — Encounter: Payer: Self-pay | Admitting: Internal Medicine

## 2014-05-07 ENCOUNTER — Ambulatory Visit (INDEPENDENT_AMBULATORY_CARE_PROVIDER_SITE_OTHER): Payer: Medicare Other | Admitting: *Deleted

## 2014-05-07 DIAGNOSIS — I639 Cerebral infarction, unspecified: Secondary | ICD-10-CM

## 2014-05-07 LAB — MDC_IDC_ENUM_SESS_TYPE_REMOTE

## 2014-05-15 NOTE — Progress Notes (Signed)
Loop recorder 

## 2014-06-01 ENCOUNTER — Telehealth: Payer: Self-pay | Admitting: *Deleted

## 2014-06-01 NOTE — Telephone Encounter (Signed)
Left voice message for patient to call back and r/s 06/23/14 appointment with Dr Leonie Man.

## 2014-06-08 ENCOUNTER — Ambulatory Visit (INDEPENDENT_AMBULATORY_CARE_PROVIDER_SITE_OTHER): Payer: Self-pay | Admitting: *Deleted

## 2014-06-08 DIAGNOSIS — I639 Cerebral infarction, unspecified: Secondary | ICD-10-CM

## 2014-06-08 LAB — MDC_IDC_ENUM_SESS_TYPE_REMOTE

## 2014-06-09 ENCOUNTER — Encounter: Payer: Self-pay | Admitting: Internal Medicine

## 2014-06-09 NOTE — Progress Notes (Signed)
Loop recorder 

## 2014-06-23 ENCOUNTER — Ambulatory Visit: Payer: Medicare Other | Admitting: Neurology

## 2014-06-24 NOTE — Telephone Encounter (Signed)
Patient was r/s to 07/06/14 at 1:30 pm

## 2014-07-06 ENCOUNTER — Encounter: Payer: Self-pay | Admitting: Neurology

## 2014-07-06 ENCOUNTER — Ambulatory Visit (INDEPENDENT_AMBULATORY_CARE_PROVIDER_SITE_OTHER): Payer: Medicare Other | Admitting: Neurology

## 2014-07-06 VITALS — BP 114/62 | HR 58 | Ht 65.0 in | Wt 124.6 lb

## 2014-07-06 DIAGNOSIS — E784 Other hyperlipidemia: Secondary | ICD-10-CM

## 2014-07-06 DIAGNOSIS — E7849 Other hyperlipidemia: Secondary | ICD-10-CM

## 2014-07-06 DIAGNOSIS — I6300 Cerebral infarction due to thrombosis of unspecified precerebral artery: Secondary | ICD-10-CM

## 2014-07-06 MED ORDER — ASPIRIN EC 325 MG PO TBEC: 325.0000 mg | DELAYED_RELEASE_TABLET | Freq: Every day | ORAL | Status: DC

## 2014-07-06 NOTE — Progress Notes (Signed)
[facility] [address] [place]. [place] [address] [phone]       OFFICE FOLLOW-UP NOTE  [name] Date of Birth:  04-17-1932 Medical Record Number:  [id number]   HPI: 21 year Caucasian lady seen today for first office follow-up visit following hospital admission for stroke on 03/10/2014.she presented with difficulty playing the violin as well as using her right hand to type for a couple of days. MRI scan of the brain showed a large left basal ganglia infarct measuring almost 2 cm and extending into the corona radiata. She also had some speech and word finding difficulties. She has known history of irregular heartbeats but has never been diagnosed with a cardiac arrhythmia. Transthoracic echo showed normal ejection fraction. Carotid ultrasound showed no significant extracranial stenosis. I personally reviewed the MRI scan and MRA which showed only mild irregularity of the basilar and anterior inferior cerebellar artery which were unrelated to her strokes.lipid profile showed total cholesterol 178, triglycerides 68, HDL 68 and LDL 96 mg percent.hemoglobin A1c was 5.5. Urine drug screen was negative.blood alcohol level was minimally elevated at 13. She admitted to drinking a glass of wine every night but denied heavy drinking. Patient was started on amlodipine and lisinopril for blood pressure and Lipitor for lipids and Plavix for stroke prevention. She states she is tolerating Plavix and Lipitor fairly well but she has been quite anxious about her blood pressure. She has recorded high blood pressure in the 170s and 180s on several occasions and in fact she panicked and called 911 couple of days ago. She seems quite anxious and has trouble controlling her anxiety. She has an appointment to see cardiologist Dr. Crissie Sickles at the end of the month to discuss her irregular heart rhythm. She did have telemetry monitoring in the hospital for 2 days but no significant arrhythmias  were detected.she has noted complete improvement in her speech and her fine motor skills in the right hand have also improved. She can type much better and her playing music is improving but not back to baseline yet. Update 07/06/2014 ; She returns for follow-up after last visit 3 months ago. She is doing much better and feels she is made for neurological recovery and now can play the violin and hasn't not noticed any problems with diminished fine motor skills in her hand. She did see Dr. Crissie Sickles who inserted a loop recorder and so for atrial fibrillation has not been discovered. She states she does not like Plavix the way it makes her feel she was nauseous and threw up twice and would prefer being changed to aspirin. She also discontinued statin as per recommendation from primary physician for the theoretical fear of having myalgias. She has been off it now for 2 months. She also complains of lack of taste of wine and tea. She has not had any new recurrent neurological symptoms. She has not had repeat lipid profile checked. She states her blood pressure is well controlled. ROS:   14 system review of systems is positive for  vomiting, nausea, loss of taste, fear of muscle aches on statins and all other systems negative  PMH:  Past Medical History  Diagnosis Date  . Hypothyroidism 3-21- 13    tx. levothyroxine  . Blood transfusion feb 2014    post surgery some time ago  . Arthritis 07-27-11    osteoarthritis-hips/s/p bil. THA, arthritis right hand   . Hypertension   . Anemia     hx of  with surgery   . Hard of hearing     both ears mild loss  . Complication of anesthesia     epinephrine - screams uncontrollably / pt does not want general anesthesia or narcotics  . Cold hands   . Osteopenia   . Constipation   . Cancer 1989    Breast cancer -s/p lt. mastectomy, some squamous cell lesions  . Fuchs' corneal dystrophy     both eyes  . Stroke     a. s/p MDT LINQ 04/2014    Social History:    History   Social History  . Marital Status: Single    Spouse Name: N/A  . Number of Children: 1  . Years of Education: N/A   Occupational History  . Retired    Social History Main Topics  . Smoking status: Former Smoker -- 1.00 packs/day for 10 years    Types: Cigarettes    Quit date: 05/09/1963  . Smokeless tobacco: Never Used  . Alcohol Use: 0.6 oz/week    1 Glasses of wine per week     Comment: wine daily with supper or beer  . Drug Use: No  . Sexual Activity: No   Other Topics Concern  . Not on file   Social History Narrative   Lives alone.    Medications:   Current Outpatient Prescriptions on File Prior to Visit  Medication Sig Dispense Refill  . amLODipine (NORVASC) 5 MG tablet Take 5 mg by mouth every morning.     Marland Kitchen Hyaluronic Acid-Vitamin C (HYALURONIC ACID PO) Take 1 tablet by mouth daily.    Marland Kitchen levothyroxine (SYNTHROID, LEVOTHROID) 125 MCG tablet Take 125 mcg by mouth daily before breakfast.    . lisinopril (PRINIVIL,ZESTRIL) 10 MG tablet Take 10 mg by mouth daily.    . methylcellulose (ARTIFICIAL TEARS) 1 % ophthalmic solution Place 1 drop into both eyes daily as needed (for dry eyes).     . Multiple Vitamins-Iron (MULTIVITAMINS WITH IRON) TABS tablet Take 1 tablet by mouth daily.     . Omega-3 Fatty Acids (FISH OIL) 1200 MG CAPS Take 1,200 mg by mouth 2 (two) times daily. Pt takes one at noon and one at night.    . Vitamin D, Cholecalciferol, 1000 UNITS TABS Take 1 tablet by mouth daily with supper.     No current facility-administered medications on file prior to visit.    Allergies:   Allergies  Allergen Reactions  . Epinephrine Other (See Comments)     Reaction:  Caused pt to scream uncontrollably.    Physical Exam General: frail elderly Caucasian lady, seated, in no evident distress.she is quite anxious Head: head normocephalic and atraumatic.  Neck: supple with no carotid or supraclavicular bruits Cardiovascular: regular rate and rhythm, no  murmurs Musculoskeletal: no deformity Skin:  no rash/petichiae Vascular:  Normal pulses all extremities Filed Vitals:   07/06/14 1337  BP: 114/62  Pulse: 58   Neurologic Exam Mental Status: Awake and fully alert. Oriented to place and time. Recent and remote memory intact. Attention span, concentration and fund of knowledge appropriate. Mood and affect appropriate.  Cranial Nerves: Fundoscopic exam not done .Pupils equal, briskly reactive to light. Extraocular movements full without nystagmus. Visual fields full to confrontation. Hearing intact. Facial sensation intact. Face, tongue, palate moves normally and symmetrically.  Motor: Normal bulk and tone. Normal strength in all tested extremity muscles.diminished fine finger movements on the right. Orbits left over right upper extremity. Sensory.: intact to touch ,pinprick .position  and vibratory sensation.  Coordination: Rapid alternating movements normal in all extremities. Finger-to-nose and heel-to-shin performed accurately bilaterally. Gait and Station: Arises from chair without difficulty. Stance is normal. Gait demonstrates normal stride length and balance . Able to heel, toe and tandem walk without difficulty.  Reflexes: 1+ and symmetric. Toes downgoing.   NIHSS  0 Modified Rankin  1   ASSESSMENT: 38 year Caucasian lady with left basal ganglia infarct in November 2015 in lenticulostriate distribution possibly from small vessel disease but large size of the infarct would also make cryptogenic stroke of embolic etiology also possibility. She has loop recorder inserted for suspected paroxysmal atrial fibrillation but so far it has not been discovered.  PLAN: I had a long d/w patient about her recent stroke, risk for recurrent stroke/TIAs, personally independently reviewed imaging studies and stroke evaluation results and answered questions.Disontinue plavix  As she does not like taking it and change to  aspirin 325 mg orally every day   for secondary stroke prevention and maintain strict control of hypertension with blood pressure goal below 130/90, diabetes with hemoglobin A1c goal below 6.5% and lipids with LDL cholesterol goal below 100 mg/dL. I also advised the patient to eat a healthy diet with plenty of whole grains, cereals, fruits and vegetables, exercise regularly and maintain ideal body weight. Check fasting lipid profile and if LDL cholesterol is greater than 70 may consider restarting statin or Zetia. Followup in the future with me in  6 months.    Note: This document was prepared with digital dictation and possible smart phrase technology. Any transcriptional errors that result from this process are unintentional

## 2014-07-06 NOTE — Patient Instructions (Signed)
I had a long d/w patient about her recent stroke, risk for recurrent stroke/TIAs, personally independently reviewed imaging studies and stroke evaluation results and answered questions.Disontinue plavix and change to  aspirin 325 mg orally every day  for secondary stroke prevention and maintain strict control of hypertension with blood pressure goal below 130/90, diabetes with hemoglobin A1c goal below 6.5% and lipids with LDL cholesterol goal below 100 mg/dL. I also advised the patient to eat a healthy diet with plenty of whole grains, cereals, fruits and vegetables, exercise regularly and maintain ideal body weight. Check fasting lipid profile and if LDL cholesterol is greater than 70 may consider restarting statin or Zetia. Followup in the future with me in  6 months.

## 2014-07-07 ENCOUNTER — Ambulatory Visit (INDEPENDENT_AMBULATORY_CARE_PROVIDER_SITE_OTHER): Payer: Medicare Other | Admitting: *Deleted

## 2014-07-07 DIAGNOSIS — I639 Cerebral infarction, unspecified: Secondary | ICD-10-CM

## 2014-07-08 LAB — MDC_IDC_ENUM_SESS_TYPE_REMOTE

## 2014-07-09 ENCOUNTER — Encounter: Payer: Self-pay | Admitting: Neurology

## 2014-07-09 NOTE — Progress Notes (Signed)
Loop recorder 

## 2014-07-13 ENCOUNTER — Other Ambulatory Visit (INDEPENDENT_AMBULATORY_CARE_PROVIDER_SITE_OTHER): Payer: Self-pay

## 2014-07-13 ENCOUNTER — Encounter: Payer: Self-pay | Admitting: Internal Medicine

## 2014-07-13 DIAGNOSIS — Z0289 Encounter for other administrative examinations: Secondary | ICD-10-CM

## 2014-07-14 LAB — LIPID PANEL
CHOLESTEROL TOTAL: 197 mg/dL (ref 100–199)
Chol/HDL Ratio: 2.6 ratio units (ref 0.0–4.4)
HDL: 76 mg/dL (ref >39)
LDL CALC: 108 mg/dL — AB (ref 0–99)
TRIGLYCERIDES: 63 mg/dL (ref 0–149)
VLDL CHOLESTEROL CAL: 13 mg/dL (ref 5–40)

## 2014-07-15 ENCOUNTER — Telehealth: Payer: Self-pay | Admitting: Neurology

## 2014-07-15 NOTE — Telephone Encounter (Signed)
Left VM message that patient is to return call  Back for more clarification of medications

## 2014-07-15 NOTE — Telephone Encounter (Signed)
Pt is calling stating she is taking atorvastatin and Co-Q10. She states Dr. Leonie Man left her a message telling her to take this and she would like a call back from Dr. Leonie Man.

## 2014-07-15 NOTE — Telephone Encounter (Signed)
Patient is returning your call of 3/9.  Please call back. Thanks!

## 2014-07-15 NOTE — Telephone Encounter (Signed)
ok 

## 2014-07-15 NOTE — Telephone Encounter (Signed)
Patient wanted to let Dr Leonie Man know that she is taking Atorvastatin and CO Q10.PCP has been  out of the office on pregnancy leave,will return on Monday and will see her then.

## 2014-07-15 NOTE — Telephone Encounter (Signed)
See telephone from 07/15/14.

## 2014-08-06 ENCOUNTER — Ambulatory Visit (INDEPENDENT_AMBULATORY_CARE_PROVIDER_SITE_OTHER): Payer: Medicare Other | Admitting: *Deleted

## 2014-08-06 DIAGNOSIS — I639 Cerebral infarction, unspecified: Secondary | ICD-10-CM | POA: Diagnosis not present

## 2014-08-06 LAB — MDC_IDC_ENUM_SESS_TYPE_REMOTE

## 2014-08-07 ENCOUNTER — Encounter: Payer: Self-pay | Admitting: Internal Medicine

## 2014-08-07 NOTE — Progress Notes (Signed)
Loop recorder 

## 2014-08-31 ENCOUNTER — Telehealth: Payer: Self-pay | Admitting: Internal Medicine

## 2014-08-31 NOTE — Telephone Encounter (Signed)
New message      Talk to someone in the device clinic.  Pt is getting a bill for reading the device she has implanted.  She is not aware of an ongoing bill.  She thought the device was to tell the dr if she has afib and she does not have afib.  Why the ongoing bill?

## 2014-09-03 ENCOUNTER — Telehealth: Payer: Self-pay | Admitting: Internal Medicine

## 2014-09-03 NOTE — Telephone Encounter (Signed)
Discussed with Dr Lovena Le. Okay to stop monitoring  Will forward to [place] to handle

## 2014-09-03 NOTE — Telephone Encounter (Signed)
LMOVM for pt to return call 

## 2014-09-03 NOTE — Telephone Encounter (Signed)
Informed pt that MD said it was ok to stop following loop recorder. Pt verbalized understanding.

## 2014-09-03 NOTE — Telephone Encounter (Signed)
Follow  Up ° ° ° ° °Pt is returning call ° ° ° °Please call back  °

## 2014-09-07 ENCOUNTER — Ambulatory Visit: Payer: Medicare Other | Admitting: *Deleted

## 2014-09-07 DIAGNOSIS — I639 Cerebral infarction, unspecified: Secondary | ICD-10-CM

## 2014-09-09 NOTE — Progress Notes (Signed)
Loop recorder 

## 2014-09-17 ENCOUNTER — Encounter: Payer: Self-pay | Admitting: Cardiology

## 2014-09-25 ENCOUNTER — Encounter: Payer: Self-pay | Admitting: Internal Medicine

## 2014-12-30 ENCOUNTER — Encounter: Payer: Self-pay | Admitting: Podiatry

## 2014-12-30 ENCOUNTER — Ambulatory Visit (INDEPENDENT_AMBULATORY_CARE_PROVIDER_SITE_OTHER): Payer: Medicare Other | Admitting: Podiatry

## 2014-12-30 ENCOUNTER — Ambulatory Visit (INDEPENDENT_AMBULATORY_CARE_PROVIDER_SITE_OTHER): Payer: Medicare Other

## 2014-12-30 VITALS — BP 119/68 | HR 56 | Temp 97.0°F | Resp 12

## 2014-12-30 DIAGNOSIS — M2580 Other specified joint disorders, unspecified joint: Secondary | ICD-10-CM | POA: Diagnosis not present

## 2014-12-30 DIAGNOSIS — R52 Pain, unspecified: Secondary | ICD-10-CM

## 2014-12-30 NOTE — Progress Notes (Signed)
   Subjective:    Patient ID: [name], female    DOB: [birth year], 81 y.o.   MRN: [id number]  HPI This patient presents today requesting evaluation for pain on the plantar right first MPJ area over the past 2 years without any increasing in symptoms in the area. There is uncomfortable with walking and relieved with rest. The symptoms are somewhat intermittent and occur anywhere from 1-3 weeks depending on activity and shoeing. Patient is quite active and often hikes wearing hiking she is on a regular basis. Patient  added a soft gel pad in the shoe under the right forefoot area and wears a eighth inch heel lift on the left. Patient describes hip surgery left which has resulted in  limb length inequality with  the right leg being slightly longer than the left. Patient would like to know if there would be a possible more permanent arrangement to reduce some of the symptoms. She states that she is not interested in any surgical treatment  Review of Systems  Cardiovascular: Positive for leg swelling.       Objective:   Physical Exam  Orientated 3  Vascular: No peripheral edema noted bilaterally DP and PT pulses 2/4 bilaterally Capillary reflex immediate bilaterally  Neurological: Sedation 10 g monofilament wire intact 5/5 bilaterally Vibratory sensation reactive bilaterally Ankle reflex equal and reactive bilaterally  Dermatological: No skin lesions noted bilaterally  Musculoskeletal: HAV deformities bilaterally No stricture in range of motion first MPJ bilaterally Mild palpable tenderness plantar lateral sesamoid right greater than left without any palpable lesions No visual evidence of limb length inequality  X-ray examination weightbearing right foot dated 12/30/2014 Intact bony structure without fracture and/or dislocation Decreased bone density noted in all views Metatarsus adductus HAV Bipartite lateral sesamoid sub-first MPJ  Radiographic impression: No acute bony  abnormality noted in the right foot         Assessment & Plan:    Assessment: Satisfactory neurovascular status Low-grade sesamoiditis sub-right first MPJ  Plan: Today I review the results of the x-ray examination and physical examination with patient today I attached a cut out felt pad to patient's shoe insole to offload the right first MPJ and gave her an additional pad. I told her that if this padding made the first MPJ more comfortable and permanent foot orthotic could be fabricated  Patient will contact our office for follow-up at her request

## 2014-12-30 NOTE — Patient Instructions (Signed)
Today I attached a felt pad to offload additional weight off the ball area the right great toe joint. The underlying tissues including the sesamoid bones are mildly inflamed and mildly tender to direct pressure, however, okay on x-ray. If you like the felt pads a more permanent shoe insert could be made

## 2015-01-05 ENCOUNTER — Ambulatory Visit: Payer: Medicare Other | Admitting: Neurology

## 2015-01-05 ENCOUNTER — Ambulatory Visit: Payer: Medicare Other | Admitting: Nurse Practitioner

## 2015-01-06 ENCOUNTER — Encounter: Payer: Self-pay | Admitting: Nurse Practitioner

## 2015-01-07 ENCOUNTER — Ambulatory Visit (INDEPENDENT_AMBULATORY_CARE_PROVIDER_SITE_OTHER): Payer: Medicare Other | Admitting: Nurse Practitioner

## 2015-01-07 ENCOUNTER — Encounter: Payer: Self-pay | Admitting: Nurse Practitioner

## 2015-01-07 VITALS — BP 110/62 | HR 56 | Ht 64.0 in | Wt 121.6 lb

## 2015-01-07 DIAGNOSIS — I10 Essential (primary) hypertension: Secondary | ICD-10-CM | POA: Diagnosis not present

## 2015-01-07 DIAGNOSIS — I639 Cerebral infarction, unspecified: Secondary | ICD-10-CM | POA: Diagnosis not present

## 2015-01-07 NOTE — Patient Instructions (Signed)
Discussed  risk for recurrent stroke/TIAs,  Continue  aspirin 325 mg orally every day for secondary stroke prevention  Maintain strict control of hypertension with blood pressure goal below 130/90,  lipids with LDL cholesterol goal below 100 mg/dL.  Continue to eat a healthy diet with plenty of whole grains, cereals, fruits and vegetables, exercise regularly and maintain ideal body weight. F/U in 8 months if stable will discharge

## 2015-01-07 NOTE — Progress Notes (Signed)
[facility]  PATIENT: [name] DOB: 1931/10/25   REASON FOR VISIT: Follow up history of stroke, hypertension, hyperlipidemia  HISTORY FROM:patient    HISTORY OF PRESENT ILLNESS: HISTORY: 77 year Caucasian lady seen today for first office follow-up visit following hospital admission for stroke on 03/10/2014.She presented with difficulty playing the violin as well as using her right hand to type for a couple of days. MRI scan of the brain showed a large left basal ganglia infarct measuring almost 2 cm and extending into the corona radiata. She also had some speech and word finding difficulties. She has known history of irregular heartbeats but has never been diagnosed with a cardiac arrhythmia. Transthoracic echo showed normal ejection fraction. Carotid ultrasound showed no significant extracranial stenosis. I personally reviewed the MRI scan and MRA which showed only mild irregularity of the basilar and anterior inferior cerebellar artery which were unrelated to her strokes.lipid profile showed total cholesterol 178, triglycerides 68, HDL 68 and LDL 96 mg percent.hemoglobin A1c was 5.5. Urine drug screen was negative.blood alcohol level was minimally elevated at 13. She admitted to drinking a glass of wine every night but denied heavy drinking. Patient was started on amlodipine and lisinopril for blood pressure and Lipitor for lipids and Plavix for stroke prevention. She states she is tolerating Plavix and Lipitor fairly well but she has been quite anxious about her blood pressure. She has recorded high blood pressure in the 170s and 180s on several occasions and in fact she panicked and called 911 couple of days ago. She seems quite anxious and has trouble controlling her anxiety. She has an appointment to see cardiologist Dr. Crissie Sickles at the end of the month to discuss her irregular heart rhythm. She did have telemetry monitoring in the hospital for 2 days but no significant  arrhythmias were detected.she has noted complete improvement in her speech and her fine motor skills in the right hand have also improved. She can type much better and her playing music is improving but not back to baseline yet. Update 07/06/2014 ; She returns for follow-up after last visit 3 months ago. She is doing much better and feels she is made for neurological recovery and now can play the violin and hasn't not noticed any problems with diminished fine motor skills in her hand. She did see Dr. Crissie Sickles who inserted a loop recorder and so for atrial fibrillation has not been discovered. She states she does not like Plavix the way it makes her feel she was nauseous and threw up twice and would prefer being changed to aspirin. She also discontinued statin as per recommendation from primary physician for the theoretical fear of having myalgias. She has been off it now for 2 months. She also complains of lack of taste of wine and tea. She has not had any new recurrent neurological symptoms. She has not had repeat lipid profile checked. She states her blood pressure is well controlled.  UPDATE: 01/07/16 Ms. Chivers, 81 year old female returns for follow-up. She had admission for stroke 04/06/2014. She has not had further stroke or TIA symptoms since that time. She has a loop recorder but so far no atrial fibrillation has been discovered. She is currently on aspirin 325 daily. Blood pressure has been well controlled. She walks 3 times a week and goes to an exercise class twice a week. She is not a smoker. Last lipid panel on 07/14/2014 with cholesterol 197 LDL 108. She is on Lipitor 10 mg daily.  She returns for reevaluation REVIEW OF SYSTEMS: Full 14 system review of systems performed and notable only for those listed, all others are neg:  Constitutional: Activity change Cardiovascular: neg Ear/Nose/Throat: neg  Skin: neg Eyes: neg Respiratory: neg Gastroitestinal: neg  Hematology/Lymphatic: Easy  bruising Endocrine: neg Musculoskeletal:neg Allergy/Immunology: neg Neurological: neg Psychiatric: neg Sleep : neg   ALLERGIES: Allergies  Allergen Reactions  . Epinephrine Other (See Comments)     Reaction:  Caused pt to scream uncontrollably.    HOME MEDICATIONS: Outpatient Prescriptions Prior to Visit  Medication Sig Dispense Refill  . amLODipine (NORVASC) 5 MG tablet Take 5 mg by mouth every morning.     Marland Kitchen aspirin EC 325 MG tablet Take 1 tablet (325 mg total) by mouth daily. 30 tablet 0  . atorvastatin (LIPITOR) 10 MG tablet     . Coenzyme Q10 10 MG capsule Take by mouth daily.     Marland Kitchen levothyroxine (SYNTHROID, LEVOTHROID) 100 MCG tablet     . lisinopril (PRINIVIL,ZESTRIL) 20 MG tablet Take 20 mg by mouth daily.     . methylcellulose (ARTIFICIAL TEARS) 1 % ophthalmic solution Place 1 drop into both eyes daily as needed (for dry eyes).     . Multiple Vitamins-Iron (MULTIVITAMINS WITH IRON) TABS tablet Take 1 tablet by mouth daily.     . Omega-3 Fatty Acids (FISH OIL) 1200 MG CAPS Take 1,200 mg by mouth 2 (two) times daily. Pt takes one at noon and one at night.    . Vitamin D, Cholecalciferol, 1000 UNITS TABS Take 1 tablet by mouth daily with supper.    . Hyaluronic Acid-Vitamin C (HYALURONIC ACID PO) Take 1 tablet by mouth daily.    Marland Kitchen levothyroxine (SYNTHROID, LEVOTHROID) 125 MCG tablet Take 125 mcg by mouth daily before breakfast.    . lisinopril (PRINIVIL,ZESTRIL) 10 MG tablet Take 10 mg by mouth daily.     No facility-administered medications prior to visit.    PAST MEDICAL HISTORY: Past Medical History  Diagnosis Date  . Hypothyroidism 3-21- 13    tx. levothyroxine  . Blood transfusion feb 2014    post surgery some time ago  . Arthritis 07-27-11    osteoarthritis-hips/s/p bil. THA, arthritis right hand   . Hypertension   . Anemia     hx of with surgery   . Hard of hearing     both ears mild loss  . Complication of anesthesia     epinephrine - screams  uncontrollably / pt does not want general anesthesia or narcotics  . Cold hands   . Osteopenia   . Constipation   . Cancer 1989    Breast cancer -s/p lt. mastectomy, some squamous cell lesions  . Fuchs' corneal dystrophy     both eyes  . Stroke     a. s/p MDT LINQ 04/2014    PAST SURGICAL HISTORY: Past Surgical History  Procedure Laterality Date  . Ankle surgery  2005    right ankle tendon repair  . Cataract extraction, bilateral  few yrs ago    Bilateral  . Squamous cell carcinoma excision  07-27-11    x2 left shoulder  . Orif femur fracture  07/31/2011    Procedure: OPEN REDUCTION INTERNAL FIXATION (ORIF) DISTAL FEMUR FRACTURE;  Surgeon: Gearlean Alf, MD;  Location: [facility];  Service: Orthopedics;  Laterality: Right;  right femur  (c-arm)   . Hardware removal  04/05/2012    Procedure: HARDWARE REMOVAL;  Surgeon: Gearlean Alf, MD;  Location: [facility];  Service: Orthopedics;  Laterality: Right;  Removal of Hardware Right Femur   . Total hip revision  04/10/2012    Procedure: TOTAL HIP REVISION;  Surgeon: Gearlean Alf, MD;  Location: [facility];  Service: Orthopedics;  Laterality: Right;  . Breast surgery  07-1987    lt.breast cancer intraductal cancer, mastectomy  . Joint replacement  07-27-11    Bilateral: Right THA - 04/2000, Left THA 06/2001  . Left hip replacement Left feb 2003  . Total hip revision Left 06/27/2012    Procedure: LEFT TOTAL HIP REVISION;  Surgeon: Gearlean Alf, MD;  Location: [facility];  Service: Orthopedics;  Laterality: Left;  Marland Kitchen Mastectomy  1989    left - Pt states has no restrictions on use of L arm for BP or blood draw  . Hardware removal Left 06/30/2013    Procedure: LEFT HIP HARDWARE REMOVAL OF CABLES;  Surgeon: Gearlean Alf, MD;  Location: [facility];  Service: Orthopedics;  Laterality: Left;  Marland Kitchen Melanoma removed Left     x 1  . Basal cell removed from face      3-4 times  . Abdominal hysterectomy      ovaries removed  . Hardware removal Right 08/21/2013      Procedure: RIGHT HIP HARDWARE REMOVAL;  Surgeon: Gearlean Alf, MD;  Location: [facility];  Service: Orthopedics;  Laterality: Right;  . Loop recorder implant  04-08-2014    MDT LINQ implanted by Dr Lovena Le for cryptogenic stroke    FAMILY HISTORY: Family History  Problem Relation Age of Onset  . Hodgkin's lymphoma Mother   . Celiac disease Mother   . Melanoma Father   . Heart disease Paternal Grandmother   . Heart disease Paternal Grandfather     SOCIAL HISTORY: Social History   Social History  . Marital Status: Single    Spouse Name: N/A  . Number of Children: 1  . Years of Education: N/A   Occupational History  . Retired    Social History Main Topics  . Smoking status: Former Smoker -- 1.00 packs/day for 10 years    Types: Cigarettes    Quit date: 05/09/1963  . Smokeless tobacco: Never Used  . Alcohol Use: 0.6 oz/week    1 Glasses of wine per week     Comment: very little  . Drug Use: No  . Sexual Activity: No   Other Topics Concern  . Not on file   Social History Narrative   Lives alone.     PHYSICAL EXAM  Filed Vitals:   01/07/15 0957  BP: 110/62  Pulse: 56  Height: 5\' 4"  (1.626 m)  Weight: 121 lb 9.6 oz (55.157 kg)   Body mass index is 20.86 kg/(m^2). General:elderly Caucasian lady, seated, in no evident distress. Head: head normocephalic and atraumatic.  Neck: supple with no carotid or supraclavicular bruits Cardiovascular: regular rate and rhythm, no murmurs Musculoskeletal: no deformity Skin: no rash/petichiae Vascular: Normal pulses all extremities Mental Status: Awake and fully alert. Oriented to place and time. Recent and remote memory intact. Attention span, concentration and fund of knowledge appropriate. Mood and affect appropriate.  Cranial Nerves: Pupils equal, briskly reactive to light. Extraocular movements full without nystagmus. Visual fields full to confrontation. Hearing intact. Facial sensation intact. Face, tongue, palate  moves normally and symmetrically.  Motor: Normal bulk and tone. Normal strength in all tested extremity muscles.diminished fine finger movements on the right. Orbits left over right upper extremity. Sensory.: intact to touch ,  pinprick .position and vibratory sensation.  Coordination: Rapid alternating movements normal in all extremities. Finger-to-nose and heel-to-shin performed accurately bilaterally. Gait and Station: Arises from chair without difficulty. Stance is normal. Gait demonstrates normal stride length and balance . Able to heel, toe and tandem walk without difficulty.  Reflexes: 1+ and symmetric. Toes downgoing.   DIAGNOSTIC DATA (LABS, IMAGING, TESTING) - Lab Results  Component Value Date   CHOL 197 07/13/2014   HDL 76 07/13/2014   LDLCALC 108* 07/13/2014   TRIG 63 07/13/2014   CHOLHDL 2.6 07/13/2014    ASSESSMENT AND PLAN  81 y.o. year old female  has a past medical history of Hypothyroidism (3-21- 13);  Hypertension; and Stroke. 04/06/14 and hyperlipidemia here to follow-up.  Discussed  risk for recurrent stroke/TIAs,  Continue  aspirin 325 mg orally every day for secondary stroke prevention  Maintain strict control of hypertension with blood pressure goal below 130/90, today's reading 110/62 Maintain lipids with LDL cholesterol goal below 100 mg/dL. Last 108 Continue to eat a healthy diet with plenty of whole grains, cereals, fruits and vegetables, exercise regularly and maintain ideal body weight. F/U in 8 months if stable will discharge Vst time 25 min Dennie Bible, [facility], [facility], APRN  [facility] [address], [place], [place] [address] [phone]

## 2015-01-08 NOTE — Progress Notes (Signed)
I agree with the above plan 

## 2015-01-14 ENCOUNTER — Encounter: Payer: Self-pay | Admitting: Podiatry

## 2015-02-09 ENCOUNTER — Ambulatory Visit (INDEPENDENT_AMBULATORY_CARE_PROVIDER_SITE_OTHER): Payer: Medicare Other | Admitting: Family Medicine

## 2015-02-09 ENCOUNTER — Encounter: Payer: Self-pay | Admitting: Family Medicine

## 2015-02-09 VITALS — BP 128/78 | HR 64 | Ht 65.0 in | Wt 123.8 lb

## 2015-02-09 DIAGNOSIS — I10 Essential (primary) hypertension: Secondary | ICD-10-CM | POA: Diagnosis not present

## 2015-02-09 DIAGNOSIS — R5383 Other fatigue: Secondary | ICD-10-CM | POA: Diagnosis not present

## 2015-02-09 DIAGNOSIS — E038 Other specified hypothyroidism: Secondary | ICD-10-CM

## 2015-02-09 DIAGNOSIS — Z7189 Other specified counseling: Secondary | ICD-10-CM

## 2015-02-09 DIAGNOSIS — Z23 Encounter for immunization: Secondary | ICD-10-CM | POA: Diagnosis not present

## 2015-02-09 DIAGNOSIS — R300 Dysuria: Secondary | ICD-10-CM | POA: Diagnosis not present

## 2015-02-09 DIAGNOSIS — Z7689 Persons encountering health services in other specified circumstances: Secondary | ICD-10-CM

## 2015-02-09 LAB — POCT URINALYSIS DIPSTICK
BILIRUBIN UA: NEGATIVE
Glucose, UA: NEGATIVE
Ketones, UA: NEGATIVE
LEUKOCYTES UA: NEGATIVE
NITRITE UA: NEGATIVE
Protein, UA: NEGATIVE
RBC UA: NEGATIVE
Spec Grav, UA: 1.015
UROBILINOGEN UA: NEGATIVE
pH, UA: 7

## 2015-02-09 LAB — COMPREHENSIVE METABOLIC PANEL
ALK PHOS: 64 U/L (ref 33–130)
ALT: 15 U/L (ref 6–29)
AST: 17 U/L (ref 10–35)
Albumin: 4.2 g/dL (ref 3.6–5.1)
BUN: 16 mg/dL (ref 7–25)
CO2: 28 mmol/L (ref 20–31)
CREATININE: 0.63 mg/dL (ref 0.60–0.88)
Calcium: 9.3 mg/dL (ref 8.6–10.4)
Chloride: 100 mmol/L (ref 98–110)
Glucose, Bld: 73 mg/dL (ref 65–99)
Potassium: 4.4 mmol/L (ref 3.5–5.3)
Sodium: 133 mmol/L — ABNORMAL LOW (ref 135–146)
TOTAL PROTEIN: 6.7 g/dL (ref 6.1–8.1)
Total Bilirubin: 0.5 mg/dL (ref 0.2–1.2)

## 2015-02-09 LAB — CBC WITH DIFFERENTIAL/PLATELET
BASOS ABS: 0.1 10*3/uL (ref 0.0–0.1)
Basophils Relative: 1 % (ref 0–1)
EOS PCT: 2 % (ref 0–5)
Eosinophils Absolute: 0.1 10*3/uL (ref 0.0–0.7)
HCT: 35.4 % — ABNORMAL LOW (ref 36.0–46.0)
Hemoglobin: 11.8 g/dL — ABNORMAL LOW (ref 12.0–15.0)
LYMPHS ABS: 1.7 10*3/uL (ref 0.7–4.0)
Lymphocytes Relative: 25 % (ref 12–46)
MCH: 29.5 pg (ref 26.0–34.0)
MCHC: 33.3 g/dL (ref 30.0–36.0)
MCV: 88.5 fL (ref 78.0–100.0)
MPV: 9.3 fL (ref 8.6–12.4)
Monocytes Absolute: 0.8 10*3/uL (ref 0.1–1.0)
Monocytes Relative: 12 % (ref 3–12)
NEUTROS PCT: 60 % (ref 43–77)
Neutro Abs: 4.1 10*3/uL (ref 1.7–7.7)
Platelets: 232 10*3/uL (ref 150–400)
RBC: 4 MIL/uL (ref 3.87–5.11)
RDW: 14.9 % (ref 11.5–15.5)
WBC: 6.9 10*3/uL (ref 4.0–10.5)

## 2015-02-09 LAB — TSH: TSH: 0.505 u[IU]/mL (ref 0.350–4.500)

## 2015-02-09 NOTE — Progress Notes (Signed)
Subjective:    Patient ID: [name], female    DOB: 14-Feb-1932, 81 y.o.   MRN: [id number]  HPI She is a pleasant 81 year old female who appears younger than stated age. She is new to the practice and here to establish primary care. She reports she started taking Lisinopril last December and thinks she is having some mild side effects such dry cough and feeling tired. Reports cough is intermittent and is not keeping her awake at night. She states she thinks she is feeling slightly more tired because she is "getting old". She continues to exercise 45 minutes at the gym twice weekly and walks in the park 90 minutes 3 times per week. She is doing all the things she enjoys daily.  She lives alone and reports no difficulties with daily activities. Denies chest pain, palpitations, DOE, or edema.  She also reports some "stinging" with urination that has been ongoing. States "Vulva" stingy with urination intermittent since last May. Dr. Delman Cheadle dermatologist- diagnosed her with lichen sclerosus ... she uses clebetasol topical occasionally for this.   She reports having had a stroke last year without any deficits. She continues to have an internal monitor that Dr. Lovena Le is monitoring. She states they were trying to determine if she had A. fib however an irregular rhythm was never detected and her current implanted device is not actually turned on. She plans to ask her cardiologist to remove this at her next appointment. She states her heart is fine.   Reports good medication compliance. She is checking her blood pressure at home occasionally.   Denies blood in stool, reports recent colonoscopy.   Immunizations: she brought an updated list. Has had recent flu shot, Tdap in 2012, Zostavax in 2008, and Pneumonia 23 in 2003.   Other providers: Dr. Lovena Le, cardiologist; Dr. Leonie Man- neurologist; Dr. Delman Cheadle, dermatologist; Dr. Katy Fitch- eyes, had cataract surgery. Last 3 previous primary care providers have retired  or stopped practicing. She lives alone and her daughter lives next door. She is a retired Doctor, hospital. She wears hearing aids when in a large group.  Reviewed allergies, medications, past medical, family and social history.   Review of Systems  Review of Systems Constitutional: -fever, -chills, -sweats, -unexpected weight change,-fatigue ENT: -runny nose, -ear pain, -sore throat Cardiology:  -chest pain, -palpitations, -edema Respiratory: + mild dry cough, -shortness of breath, -wheezing Gastroenterology: -abdominal pain, -nausea, -vomiting, -diarrhea, -constipation  Hematology: -bleeding, +bruising problems "since taking aspirin" Urology: -dysuria, -difficulty urinating, -hematuria, -urinary frequency, -urgency Neurology: -headache, -weakness, -tingling, -numbness       Objective:   Physical Exam  Alert and oriented and in no distress. Cardiac exam shows a regular sinus rhythm without murmurs or gallops. Lungs are clear to auscultation. Recommended a pelvic exam due to vulvar irritation- she declined.   Urinalysis dipstick negative     Assessment & Plan:  Encounter to establish care  Dysuria - Plan: POCT urinalysis dipstick  Other fatigue - Plan: CBC with Differential/Platelet, Comprehensive metabolic panel, TSH  Need for prophylactic vaccination against Streptococcus pneumoniae (pneumococcus) - Plan: Pneumococcal conjugate vaccine 13-valent IM  Essential hypertension  Other specified hypothyroidism  She does not want to change blood pressure medications at this time and states cough is not that bothersome. Discussed that cough is a common side effect of ace inhibitors and if her cough becomes more bothersome that we can switch her medication. Discussed that blood pressure goal is less than 130/90 and encouraged her to continue checking  her blood pressures at home and to let me know if her readings are not within goal.  Reviewed her previous blood test results with her  and discussed that she had slightly decreased sodium and hemoglobin last year, however, that appears to be related to surgery. Will repeat due to fatigue. Will not check her lipids today due to not fasting. She is aware that we will need to do this at her next appointment.  Advised her to continue on current medication regimen and to follow-up as scheduled with other providers. She has upcoming appointments with her cardiologist and dermatologist.  Discussed that her urine shows no signs of infection. She declined a pelvic exam today, suspect that this stinging sensation she is feeling to her vulva may be due to lichen sclerosis but cannot rule out other causes. Encouraged her to ask Dr. Delman Cheadle about this at her follow up and to continue using the medication she prescribed for her.  Spent at least 45 minutes with half of that in counseling and coordination of care.

## 2015-04-15 ENCOUNTER — Ambulatory Visit (INDEPENDENT_AMBULATORY_CARE_PROVIDER_SITE_OTHER): Payer: Medicare Other | Admitting: Internal Medicine

## 2015-04-15 ENCOUNTER — Encounter: Payer: Self-pay | Admitting: Internal Medicine

## 2015-04-15 DIAGNOSIS — E785 Hyperlipidemia, unspecified: Secondary | ICD-10-CM | POA: Insufficient documentation

## 2015-04-15 DIAGNOSIS — I10 Essential (primary) hypertension: Secondary | ICD-10-CM

## 2015-04-15 DIAGNOSIS — I4891 Unspecified atrial fibrillation: Secondary | ICD-10-CM | POA: Insufficient documentation

## 2015-04-15 DIAGNOSIS — I639 Cerebral infarction, unspecified: Secondary | ICD-10-CM | POA: Diagnosis not present

## 2015-04-15 DIAGNOSIS — I480 Paroxysmal atrial fibrillation: Secondary | ICD-10-CM | POA: Diagnosis not present

## 2015-04-15 HISTORY — DX: Hyperlipidemia, unspecified: E78.5

## 2015-04-15 MED ORDER — LISINOPRIL 10 MG PO TABS
20.0000 mg | ORAL_TABLET | Freq: Every day | ORAL | Status: DC
Start: 2015-04-15 — End: 2015-05-11

## 2015-04-15 MED ORDER — ATORVASTATIN CALCIUM 10 MG PO TABS: 5.0000 mg | ORAL_TABLET | Freq: Every day | ORAL | Status: DC

## 2015-04-15 MED ORDER — APIXABAN 5 MG PO TABS: 5.0000 mg | ORAL_TABLET | Freq: Two times a day (BID) | ORAL | Status: DC

## 2015-04-15 NOTE — Assessment & Plan Note (Signed)
At this point, we can conclude that her atrial fib is the cause of her stroke.

## 2015-04-15 NOTE — Assessment & Plan Note (Signed)
This is a new diagnosis. I have recommended she start Eliquis and we will stop ASA. She is not on plavix. I will see her back in 6 months. No indication for any AV nodal blocking drugs at this time.

## 2015-04-15 NOTE — Assessment & Plan Note (Signed)
Her blood pressure has been low. She will reduce her dose of lisinopril.

## 2015-04-15 NOTE — Patient Instructions (Addendum)
Medication Instructions:  Your physician has recommended you make the following change in your medication:  1) Stop Aspirin 2) Start Eliquis 5 mg twice daily 3) Decrease Lipitor to 5mg  daily 4) Decrease Lisinopril to 10 mg daily    Labwork: None ordered   Testing/Procedures: None ordered   Follow-Up: Your physician wants you to follow-up in: 6 months with Dr Knox Saliva will receive a reminder letter in the mail two months in advance. If you don't receive a letter, please call our office to schedule the follow-up appointment.  Remote monitoring is used to monitor your Pacemaker  from home. This monitoring reduces the number of office visits required to check your device to one time per year. It allows [place] to keep an eye on the functioning of your device to ensure it is working properly. You are scheduled for a device check from home on 07/15/15. You may send your transmission at any time that day. If you have a wireless device, the transmission will be sent automatically. After your physician reviews your transmission, you will receive a postcard with your next transmission date.    Any Other Special Instructions Will Be Listed Below (If Applicable).     If you need a refill on your cardiac medications before your next appointment, please call your pharmacy.

## 2015-04-15 NOTE — Progress Notes (Signed)
HPI [name] returns today for ongoing evaluation of her ILR for cryptogenic stroke.  The patient was initially bothered by fine motor skill deficits, especially playing the violin. She has subsequently recovered. Her other medical problems include hypertension, and she has evidence of diastolic dysfunction by echo. She remains hypertensive. She had mild to moderate left atrial enlargement by echo. She is currently been on ASA for prevention of stroke. She has not had syncope. She denies peripheral edema. For reasons that are not clear to me, she stopped using her remote monitor. Allergies  Allergen Reactions  . Epinephrine Other (See Comments)     Reaction:  Caused pt to scream uncontrollably.     Current Outpatient Prescriptions  Medication Sig Dispense Refill  . amLODipine (NORVASC) 5 MG tablet Take 5 mg by mouth every morning.     Marland Kitchen aspirin EC 325 MG tablet Take 1 tablet (325 mg total) by mouth daily. 30 tablet 0  . atorvastatin (LIPITOR) 10 MG tablet Take 10 mg by mouth daily.     . Coenzyme Q10 10 MG capsule Take by mouth daily.     Marland Kitchen levothyroxine (SYNTHROID, LEVOTHROID) 100 MCG tablet Take 100 mcg by mouth daily.     Marland Kitchen lisinopril (PRINIVIL,ZESTRIL) 20 MG tablet Take 20 mg by mouth daily.     . methylcellulose (ARTIFICIAL TEARS) 1 % ophthalmic solution Place 1 drop into both eyes daily as needed (for dry eyes).     . Multiple Vitamins-Iron (MULTIVITAMINS WITH IRON) TABS tablet Take 1 tablet by mouth daily.     . Omega-3 Fatty Acids (FISH OIL) 1200 MG CAPS Take 1,200 mg by mouth 2 (two) times daily. Pt takes one at noon and one at night.    . Vitamin D, Cholecalciferol, 1000 UNITS TABS Take 1 tablet by mouth daily with supper.     No current facility-administered medications for this visit.     Past Medical History  Diagnosis Date  . Hypothyroidism 3-21- 13    tx. levothyroxine  . Blood transfusion feb 2014    post surgery some time ago  . Arthritis 07-27-11   osteoarthritis-hips/s/p bil. THA, arthritis right hand   . Hypertension   . Anemia     hx of with surgery   . Hard of hearing     both ears mild loss  . Complication of anesthesia     epinephrine - screams uncontrollably / pt does not want general anesthesia or narcotics  . Cold hands   . Osteopenia   . Constipation   . Cancer ([place]) 1989    Breast cancer -s/p lt. mastectomy, some squamous cell lesions  . Fuchs' corneal dystrophy     both eyes  . Stroke [facility])     a. s/p MDT LINQ 04/2014  . Lichen sclerosus et atrophicus of the vulva     pt reported.     ROS:   All systems reviewed and negative except as noted in the HPI.   Past Surgical History  Procedure Laterality Date  . Ankle surgery  2005    right ankle tendon repair  . Cataract extraction, bilateral  few yrs ago    Bilateral  . Squamous cell carcinoma excision  07-27-11    x2 left shoulder  . Orif femur fracture  07/31/2011    Procedure: OPEN REDUCTION INTERNAL FIXATION (ORIF) DISTAL FEMUR FRACTURE;  Surgeon: Gearlean Alf, MD;  Location: [facility];  Service: Orthopedics;  Laterality: Right;  right  femur  (c-arm)   . Hardware removal  04/05/2012    Procedure: HARDWARE REMOVAL;  Surgeon: Gearlean Alf, MD;  Location: [facility];  Service: Orthopedics;  Laterality: Right;  Removal of Hardware Right Femur   . Total hip revision  04/10/2012    Procedure: TOTAL HIP REVISION;  Surgeon: Gearlean Alf, MD;  Location: [facility];  Service: Orthopedics;  Laterality: Right;  . Breast surgery  07-1987    lt.breast cancer intraductal cancer, mastectomy  . Joint replacement  07-27-11    Bilateral: Right THA - 04/2000, Left THA 06/2001  . Left hip replacement Left feb 2003  . Total hip revision Left 06/27/2012    Procedure: LEFT TOTAL HIP REVISION;  Surgeon: Gearlean Alf, MD;  Location: [facility];  Service: Orthopedics;  Laterality: Left;  Marland Kitchen Mastectomy  1989    left - Pt states has no restrictions on use of L arm for BP or blood draw  .  Hardware removal Left 06/30/2013    Procedure: LEFT HIP HARDWARE REMOVAL OF CABLES;  Surgeon: Gearlean Alf, MD;  Location: [facility];  Service: Orthopedics;  Laterality: Left;  Marland Kitchen Melanoma removed Left     x 1  . Basal cell removed from face      3-4 times  . Abdominal hysterectomy      ovaries removed  . Hardware removal Right 08/21/2013    Procedure: RIGHT HIP HARDWARE REMOVAL;  Surgeon: Gearlean Alf, MD;  Location: [facility];  Service: Orthopedics;  Laterality: Right;  . Loop recorder implant  04-08-2014    MDT LINQ implanted by Dr Lovena Le for cryptogenic stroke     Family History  Problem Relation Age of Onset  . Hodgkin's lymphoma Mother   . Celiac disease Mother   . Cancer Mother   . Melanoma Father   . Cancer Father   . Heart disease Paternal Grandmother   . Heart disease Paternal Grandfather   . Cancer Brother   . Arthritis Sister      Social History   Social History  . Marital Status: Single    Spouse Name: N/A  . Number of Children: 1  . Years of Education: N/A   Occupational History  . Retired    Social History Main Topics  . Smoking status: Former Smoker -- 1.00 packs/day for 10 years    Types: Cigarettes    Quit date: 05/09/1963  . Smokeless tobacco: Never Used  . Alcohol Use: 0.6 oz/week    1 Glasses of wine per week     Comment: very little  . Drug Use: No  . Sexual Activity: No   Other Topics Concern  . Not on file   Social History Narrative   Lives alone.     BP 140/70 mmHg  Pulse 52  Ht 5\' 4"  (1.626 m)  Wt 127 lb (57.607 kg)  BMI 21.79 kg/m2  SpO2 95%  Physical Exam:  Well appearing 81 year old woman, NAD HEENT: Unremarkable Neck:  6 cm JVD, no thyromegally Back:  No CVA tenderness Lungs:  Clear with no wheezes, rales, or rhonchi. HEART:  Regular rate rhythm, no murmurs, no rubs, no clicks Abd:  soft, positive bowel sounds, no organomegally, no rebound, no guarding Ext:  2 plus pulses, no edema, no cyanosis, no clubbing Skin:   No rashes no nodules Neuro:  CN II through XII intact, motor grossly intact  ILR - episodes of atrial fib lasting up to 3 hours   Assess/Plan:

## 2015-04-15 NOTE — Assessment & Plan Note (Signed)
She is concerned about complications from lipitor. I have asked that she at least take 5 mg daily.

## 2015-04-26 LAB — CUP PACEART INCLINIC DEVICE CHECK: Date Time Interrogation Session: 20161208152717

## 2015-04-29 ENCOUNTER — Telehealth: Payer: Self-pay | Admitting: Internal Medicine

## 2015-04-29 NOTE — Telephone Encounter (Signed)
New Message   *STAT* If patient is at the pharmacy, call can be transferred to refill team.   1. Which medications need to be refilled? (please list name of each medication and dose if known) Eliquis  2. Which pharmacy/location (including street and city if local pharmacy) is medication to be sent to? [facility] Will need prior auth.

## 2015-05-04 ENCOUNTER — Telehealth: Payer: Self-pay

## 2015-05-04 NOTE — Telephone Encounter (Signed)
Prior auth for Eliquis 5 mg sent to [facility]. 

## 2015-05-04 NOTE — Telephone Encounter (Signed)
Prior auth sent today

## 2015-05-07 ENCOUNTER — Telehealth: Payer: Self-pay | Admitting: Internal Medicine

## 2015-05-07 ENCOUNTER — Telehealth: Payer: Self-pay

## 2015-05-07 NOTE — Telephone Encounter (Signed)
Pt's insurance is denying coverage for Eliquis- is there another med she can try ? [phone]

## 2015-05-07 NOTE — Telephone Encounter (Signed)
Called patient.  Advised that we have received PA for the next year for her Eliquis today.  Patient happy.

## 2015-05-07 NOTE — Telephone Encounter (Signed)
Eliquis approved through 05/03/2016.

## 2015-05-09 ENCOUNTER — Encounter: Payer: Self-pay | Admitting: Internal Medicine

## 2015-05-11 ENCOUNTER — Other Ambulatory Visit: Payer: Self-pay

## 2015-05-11 ENCOUNTER — Telehealth: Payer: Self-pay

## 2015-05-11 ENCOUNTER — Other Ambulatory Visit: Payer: Self-pay | Admitting: *Deleted

## 2015-05-11 DIAGNOSIS — I10 Essential (primary) hypertension: Secondary | ICD-10-CM

## 2015-05-11 MED ORDER — LISINOPRIL 10 MG PO TABS: 20.0000 mg | ORAL_TABLET | Freq: Every day | ORAL | Status: DC

## 2015-05-11 MED ORDER — AMLODIPINE BESYLATE 5 MG PO TABS: 5.0000 mg | ORAL_TABLET | Freq: Every morning | ORAL | Status: DC

## 2015-05-11 NOTE — Telephone Encounter (Signed)
Left message for patient to call back  

## 2015-05-25 ENCOUNTER — Encounter: Payer: Self-pay | Admitting: Family Medicine

## 2015-05-25 ENCOUNTER — Ambulatory Visit (INDEPENDENT_AMBULATORY_CARE_PROVIDER_SITE_OTHER): Payer: Medicare Other | Admitting: Family Medicine

## 2015-05-25 VITALS — BP 122/78 | HR 68 | Wt 123.4 lb

## 2015-05-25 DIAGNOSIS — I4891 Unspecified atrial fibrillation: Secondary | ICD-10-CM | POA: Diagnosis not present

## 2015-05-25 DIAGNOSIS — I10 Essential (primary) hypertension: Secondary | ICD-10-CM

## 2015-05-25 DIAGNOSIS — R5383 Other fatigue: Secondary | ICD-10-CM

## 2015-05-25 DIAGNOSIS — E038 Other specified hypothyroidism: Secondary | ICD-10-CM

## 2015-05-25 NOTE — Progress Notes (Signed)
Subjective:    Patient ID: [name], female    DOB: 12-22-1931, 82 y.o.   MRN: [id number]  HPI Chief Complaint  Patient presents with  . 3 month follow-up    3 month follow-up. not fasting.    She is here for follow up of blood pressure. She checks her blood pressure 1-2 times daily. Has been taking Lisinopril 10 mg per cardiologist recommendation. Readings have been mostly within normal range <130/80. Reports one episode of fast heart rate and elevated blood pressure 153/123 in the middle of the night sometime in December. States it only lasted a few minutes then subsided. No issues like this since. Denies fever, chills, bodyaches, headaches, dizziness, DOE, chest pain, LE edema.   Complains of sensation of food getting stuck in her esophagus with white potatoes or certain foods thick in texture, states this is becoming a problem for her but does not want to be checked for it now. She has never had a en endoscopy.   Atrial fibrillation- states she has an internal monitor but is not currently paying for it so it is not working and does not check on heart rate. She had recent appointment with cardiologist.  She was placed on Eliquis and is taking 5 mg twice daily as prescribed for A-fib. Fatigue- not as bothersome. Wakes up energetic. Takes walks, does chores, plays her violin. Takes nap in afternoon. Sleep- no changes.  Taking Synthroid daily. Last TSH was low normal.   Reports her weight is stable. She eats healthy diet and exercises most days, hikes outdoors.  Dr. Delman Cheadle - had squamous cell removed from right forearm and left lower leg. Lichen sclerosis, anus and vaginal itching managed with clebetosol.  She is seeing a physical therapist Adalberto Ill, [facility],  for movement she felt like she wasn't moving well so he is helping her with this. She is happy with this. She enjoys being active. She states she is walking better and getting up and down out of chair.   Bone density  scan appointment- August 22nd. 2 years ago. Vitamin D.  Taking multivitamin with iron Hyperlipidemia-  Currently taking atorvastatin 5 mg per cardiologist. States she is taking Fish oil and CoQ10 to help with side effects, muscle aches, of statin. States this seems to help.  States she is only drinking half a beer now, history of enjoying alcohol but no longer does.  She would like for me to check her feet today, states she sometimes removes calluses on her own. Denies any other issues.    Review of Systems  pertinent positives and negatives in the history of present illness.    Objective:   Physical Exam BP 122/78 mmHg  Pulse 68  Wt 123 lb 6.4 oz (55.974 kg) Alert and in no distress.  Cardiac exam shows a regular sinus rhythm without murmurs or gallops. Lungs are clear to auscultation. Foot exam per patient request shows intact skin, mild callus to bottom of left foot. No erythema, edema. Normal sensation (test with monofilament performed), pulse, cap refill, ROM and strength.       Assessment & Plan:  Essential hypertension  Other specified hypothyroidism  Atrial fibrillation, unspecified type ([place])  Other fatigue  Discussed that her blood pressures today and reviewing her home blood pressure monitor are mostly within goal. No changes to medication. Continue on 10 mg lisinopril.  Atrial fibrillation- heart rate regular today. No issues with this since December. Continue on Eliquis as prescribed by cardiologist.  Fatigue appears to be stable. Take naps as needed as long as it does not interfere with nighttime sleeping.  Continue exercising as tolerated and eating healthy. She will let me know if she continues to have sensation of food getting stuck in her esophagus and we can further evaluate this.  Discussed that her TSH was low normal. Will recheck this at her physical in May and will make medication adjustments accordingly. Recommend she avoid cutting off calluses to her feet  and if they get worse I will refer to podiatrist, she has seen them in past for same.

## 2015-06-01 ENCOUNTER — Encounter: Payer: Self-pay | Admitting: Family Medicine

## 2015-06-01 DIAGNOSIS — I10 Essential (primary) hypertension: Secondary | ICD-10-CM

## 2015-06-01 MED ORDER — LISINOPRIL 10 MG PO TABS: 10.0000 mg | ORAL_TABLET | Freq: Every day | ORAL | Status: DC

## 2015-06-10 ENCOUNTER — Encounter: Payer: Self-pay | Admitting: Internal Medicine

## 2015-06-10 ENCOUNTER — Encounter: Payer: Self-pay | Admitting: Family Medicine

## 2015-06-17 ENCOUNTER — Other Ambulatory Visit: Payer: Self-pay | Admitting: *Deleted

## 2015-06-17 DIAGNOSIS — R5383 Other fatigue: Secondary | ICD-10-CM

## 2015-06-17 DIAGNOSIS — I480 Paroxysmal atrial fibrillation: Secondary | ICD-10-CM

## 2015-09-02 ENCOUNTER — Encounter: Payer: Self-pay | Admitting: Family Medicine

## 2015-09-07 ENCOUNTER — Ambulatory Visit (INDEPENDENT_AMBULATORY_CARE_PROVIDER_SITE_OTHER): Payer: Medicare Other | Admitting: Nurse Practitioner

## 2015-09-07 ENCOUNTER — Encounter: Payer: Self-pay | Admitting: Nurse Practitioner

## 2015-09-07 VITALS — BP 141/63 | HR 48 | Ht 64.0 in | Wt 124.2 lb

## 2015-09-07 DIAGNOSIS — I639 Cerebral infarction, unspecified: Secondary | ICD-10-CM | POA: Diagnosis not present

## 2015-09-07 DIAGNOSIS — I10 Essential (primary) hypertension: Secondary | ICD-10-CM | POA: Diagnosis not present

## 2015-09-07 DIAGNOSIS — I480 Paroxysmal atrial fibrillation: Secondary | ICD-10-CM

## 2015-09-07 DIAGNOSIS — E785 Hyperlipidemia, unspecified: Secondary | ICD-10-CM | POA: Diagnosis not present

## 2015-09-07 NOTE — Progress Notes (Signed)
[facility]  PATIENT: [name] DOB: 18-Jul-1931   REASON FOR VISIT: Follow-up for stroke and essential hypertension, hyperlipidemia HISTORY FROM: Patient    HISTORY OF PRESENT ILLNESS: HISTORY: 49 year Caucasian lady seen today for first office follow-up visit following hospital admission for stroke on 03/10/2014.She presented with difficulty playing the violin as well as using her right hand to type for a couple of days. MRI scan of the brain showed a large left basal ganglia infarct measuring almost 2 Tiffany and extending into the corona radiata. She also had some speech and word finding difficulties. She has known history of irregular heartbeats but has never been diagnosed with a cardiac arrhythmia. Transthoracic echo showed normal ejection fraction. Carotid ultrasound showed no significant extracranial stenosis. I personally reviewed the MRI scan and MRA which showed only mild irregularity of the basilar and anterior inferior cerebellar artery which were unrelated to her strokes.lipid profile showed total cholesterol 178, triglycerides 68, HDL 68 and LDL 96 mg percent.hemoglobin A1c was 5.5. Urine drug screen was negative.blood alcohol level was minimally elevated at 13. She admitted to drinking a glass of wine every night but denied heavy drinking. Patient was started on amlodipine and lisinopril for blood pressure and Lipitor for lipids and Plavix for stroke prevention. She states she is tolerating Plavix and Lipitor fairly well but she has been quite anxious about her blood pressure. She has recorded high blood pressure in the 170s and 180s on several occasions and in fact she panicked and called 911 couple of days ago. She seems quite anxious and has trouble controlling her anxiety. She has an appointment to see cardiologist Dr. Crissie Sickles at the end of the month to discuss her irregular heart rhythm. She did have telemetry monitoring in the hospital for 2 days but no  significant arrhythmias were detected.she has noted complete improvement in her speech and her fine motor skills in the right hand have also improved. She can type much better and her playing music is improving but not back to baseline yet. Update 07/06/2014 ; She returns for follow-up after last visit 3 months ago. She is doing much better and feels she is made for neurological recovery and now can play the violin and hasn't not noticed any problems with diminished fine motor skills in her hand. She did see Dr. Crissie Sickles who inserted a loop recorder and so for atrial fibrillation has not been discovered. She states she does not like Plavix the way it makes her feel she was nauseous and threw up twice and would prefer being changed to aspirin. She also discontinued statin as per recommendation from primary physician for the theoretical fear of having myalgias. She has been off it now for 2 months. She also complains of lack of taste of wine and tea. She has not had any new recurrent neurological symptoms. She has not had repeat lipid profile checked. She states her blood pressure is well controlled.  UPDATE: 01/07/16 [name], 82 year old female returns for follow-up. She had admission for stroke 04/06/2014. She has not had further stroke or TIA symptoms since that time. She has a loop recorder but so far no atrial fibrillation has been discovered. She is currently on aspirin 325 daily. Blood pressure has been well controlled. She walks 3 times a week and goes to an exercise class twice a week. She is not a smoker. Last lipid panel on 07/14/2014 with cholesterol 197 LDL 108. She is on Lipitor 10 mg daily.  She returns for reevaluation UPDATE 09/07/2015.[name], 82 year old female returns for follow-up. She had admission for stroke 04/06/2014 without further stroke or TIA symptoms. Her loop recorder picked up episodes of atrial fib lasting up to 3 hours in December and she was placed on Eliquis and  aspirin was stopped by Dr. Lovena Le cardiology. She continues to exercise by walking and going to an exercise class. Her blood pressure is mildly elevated in the office today at 141/63. She is currently getting some physical therapy which she is finding beneficial for back issues.  She has not had any recurrent neurological symptoms. She returns for reevaluation REVIEW OF SYSTEMS: Full 14 system review of systems performed and notable only for those listed, all others are neg:  Constitutional: neg  Cardiovascular: neg Ear/Nose/Throat: neg  Skin: neg Eyes: neg Respiratory: neg Gastroitestinal: neg  Hematology/Lymphatic: neg  Endocrine: neg Musculoskeletal: Muscle cramps Allergy/Immunology: neg Neurological: neg Psychiatric: neg Sleep : neg   ALLERGIES: Allergies  Allergen Reactions  . Epinephrine Other (See Comments)     Reaction:  Caused pt to scream uncontrollably.    HOME MEDICATIONS: Outpatient Prescriptions Prior to Visit  Medication Sig Dispense Refill  . amLODipine (NORVASC) 5 MG tablet Take 1 tablet (5 mg total) by mouth every morning. 90 tablet 3  . apixaban (ELIQUIS) 5 MG TABS tablet Take 1 tablet (5 mg total) by mouth 2 (two) times daily. 180 tablet 3  . atorvastatin (LIPITOR) 10 MG tablet Take 0.5 tablets (5 mg total) by mouth daily. 45 tablet 3  . Coenzyme Q10 10 MG capsule Take by mouth daily.     Marland Kitchen levothyroxine (SYNTHROID, LEVOTHROID) 100 MCG tablet Take 100 mcg by mouth daily.     Marland Kitchen lisinopril (PRINIVIL,ZESTRIL) 10 MG tablet Take 1 tablet (10 mg total) by mouth daily. 90 tablet 3  . methylcellulose (ARTIFICIAL TEARS) 1 % ophthalmic solution Place 1 drop into both eyes daily as needed (for dry eyes).     . Multiple Vitamins-Iron (MULTIVITAMINS WITH IRON) TABS tablet Take 1 tablet by mouth daily.     . Omega-3 Fatty Acids (FISH OIL) 1200 MG CAPS Take 1,200 mg by mouth 2 (two) times daily. Pt takes one at noon and one at night.    . Vitamin D, Cholecalciferol, 1000  UNITS TABS Take 1 tablet by mouth daily with supper.     No facility-administered medications prior to visit.    PAST MEDICAL HISTORY: Past Medical History  Diagnosis Date  . Hypothyroidism 3-21- 13    tx. levothyroxine  . Blood transfusion feb 2014    post surgery some time ago  . Arthritis 07-27-11    osteoarthritis-hips/s/p bil. THA, arthritis right hand   . Hypertension   . Anemia     hx of with surgery   . Hard of hearing     both ears mild loss  . Complication of anesthesia     epinephrine - screams uncontrollably / pt does not want general anesthesia or narcotics  . Cold hands   . Osteopenia   . Constipation   . Cancer ([place]) 1989    Breast cancer -s/p lt. mastectomy, some squamous cell lesions  . Fuchs' corneal dystrophy     both eyes  . Stroke [facility])     a. s/p MDT LINQ 04/2014  . Lichen sclerosus et atrophicus of the vulva     pt reported.     PAST SURGICAL HISTORY: Past Surgical History  Procedure Laterality Date  . Ankle  surgery  2005    right ankle tendon repair  . Cataract extraction, bilateral  few yrs ago    Bilateral  . Squamous cell carcinoma excision  07-27-11    x2 left shoulder  . Orif femur fracture  07/31/2011    Procedure: OPEN REDUCTION INTERNAL FIXATION (ORIF) DISTAL FEMUR FRACTURE;  Surgeon: Gearlean Alf, MD;  Location: [facility];  Service: Orthopedics;  Laterality: Right;  right femur  (c-arm)   . Hardware removal  04/05/2012    Procedure: HARDWARE REMOVAL;  Surgeon: Gearlean Alf, MD;  Location: [facility];  Service: Orthopedics;  Laterality: Right;  Removal of Hardware Right Femur   . Total hip revision  04/10/2012    Procedure: TOTAL HIP REVISION;  Surgeon: Gearlean Alf, MD;  Location: [facility];  Service: Orthopedics;  Laterality: Right;  . Breast surgery  07-1987    lt.breast cancer intraductal cancer, mastectomy  . Joint replacement  07-27-11    Bilateral: Right THA - 04/2000, Left THA 06/2001  . Left hip replacement Left feb 2003  . Total  hip revision Left 06/27/2012    Procedure: LEFT TOTAL HIP REVISION;  Surgeon: Gearlean Alf, MD;  Location: [facility];  Service: Orthopedics;  Laterality: Left;  Marland Kitchen Mastectomy  1989    left - Pt states has no restrictions on use of L arm for BP or blood draw  . Hardware removal Left 06/30/2013    Procedure: LEFT HIP HARDWARE REMOVAL OF CABLES;  Surgeon: Gearlean Alf, MD;  Location: [facility];  Service: Orthopedics;  Laterality: Left;  Marland Kitchen Melanoma removed Left     x 1  . Basal cell removed from face      3-4 times  . Abdominal hysterectomy      ovaries removed  . Hardware removal Right 08/21/2013    Procedure: RIGHT HIP HARDWARE REMOVAL;  Surgeon: Gearlean Alf, MD;  Location: [facility];  Service: Orthopedics;  Laterality: Right;  . Loop recorder implant  04-08-2014    MDT LINQ implanted by Dr Lovena Le for cryptogenic stroke    FAMILY HISTORY: Family History  Problem Relation Age of Onset  . Hodgkin's lymphoma Mother   . Celiac disease Mother   . Cancer Mother   . Melanoma Father   . Cancer Father   . Heart disease Paternal Grandmother   . Heart disease Paternal Grandfather   . Cancer Brother   . Arthritis Sister     SOCIAL HISTORY: Social History   Social History  . Marital Status: Single    Spouse Name: N/A  . Number of Children: 1  . Years of Education: N/A   Occupational History  . Retired    Social History Main Topics  . Smoking status: Former Smoker -- 1.00 packs/day for 10 years    Types: Cigarettes    Quit date: 05/09/1963  . Smokeless tobacco: Never Used  . Alcohol Use: 0.6 oz/week    1 Glasses of wine per week     Comment: very little  . Drug Use: No  . Sexual Activity: No   Other Topics Concern  . Not on file   Social History Narrative   Lives alone.     PHYSICAL EXAM  Filed Vitals:   09/07/15 0921  BP: 141/63  Pulse: 48  Height: 5\' 4"  (1.626 m)  Weight: 124 lb 3.2 oz (56.337 kg)   Body mass index is 21.31 kg/(m^2).  Generalized: Well  developed, Elderly Caucasian female in no acute  distress  Head: normocephalic and atraumatic,. Oropharynx benign  Neck: Supple, no carotid bruits  Cardiac: Regular rate rhythm, no murmur  Musculoskeletal: No deformity   Neurological examination   Mentation: Alert oriented to time, place, history taking. Attention span and concentration appropriate. Recent and remote memory intact.  Follows all commands speech and language fluent.   Cranial nerve II-XII: Pupils were equal round reactive to light extraocular movements were full, visual field were full on confrontational test. Facial sensation and strength were normal. hearing was intact to finger rubbing bilaterally. Uvula tongue midline. head turning and shoulder shrug were normal and symmetric.Tongue protrusion into cheek strength was normal. Motor: normal bulk and tone, full strength in the BUE, BLE, fine finger movements normal, no pronator drift. No focal weakness Sensory: normal and symmetric to light touch, pinprick, and  Vibration,  Coordination: finger-nose-finger, heel-to-shin bilaterally, no dysmetria Reflexes: 1+ upper lower and symmetric plantar responses were flexor bilaterally. Gait and Station: Rising up from seated position without assistance, normal stance,  moderate stride, good arm swing, smooth turning, able to perform tiptoe, and heel walking without difficulty. Tandem gait is steady. No assistive device  DIAGNOSTIC DATA (LABS, IMAGING, TESTING) - I reviewed patient records, labs, notes, testing and imaging myself where available.  Lab Results  Component Value Date   WBC 6.9 02/09/2015   HGB 11.8* 02/09/2015   HCT 35.4* 02/09/2015   MCV 88.5 02/09/2015   PLT 232 02/09/2015      Component Value Date/Time   NA 133* 02/09/2015 0001   K 4.4 02/09/2015 0001   CL 100 02/09/2015 0001   CO2 28 02/09/2015 0001   GLUCOSE 73 02/09/2015 0001   BUN 16 02/09/2015 0001   CREATININE 0.63 02/09/2015 0001   CREATININE 0.67  03/14/2014 2107   CALCIUM 9.3 02/09/2015 0001   PROT 6.7 02/09/2015 0001   ALBUMIN 4.2 02/09/2015 0001   AST 17 02/09/2015 0001   ALT 15 02/09/2015 0001   ALKPHOS 64 02/09/2015 0001   BILITOT 0.5 02/09/2015 0001   GFRNONAA 81* 03/14/2014 2107   GFRAA >90 03/14/2014 2107   Lab Results  Component Value Date   CHOL 197 07/13/2014   HDL 76 07/13/2014   LDLCALC 108* 07/13/2014   TRIG 63 07/13/2014   CHOLHDL 2.6 07/13/2014   Lab Results  Component Value Date   HGBA1C 5.5 03/11/2014   No results found for: [id number] Lab Results  Component Value Date   TSH 0.505 02/09/2015        Assessment and plan 82 year old female with a past medical history of hypertension and stroke which occurred 04/06/2014 and hyperlipidemia. Episode of atrial fibrillation on loop recorder in December. Aspirin stopped and she is on Eliquis. The patient is a current patient of Dr. Leonie Man who is out of the office today . This note is sent to the work in doctor.     PLANContinue Eliquis for secondary stroke prevention and atrial fibrillation Maintain strict control of hypertension with blood pressure goal below 130/90, today's reading 141/63 Maintain lipids with LDL cholesterol goal below 100 mg/dL. continue Lipitor Continue to eat a healthy diet with plenty of whole grains, cereals, fruits and vegetables, exercise regularly and maintain ideal body weight. No further stroke or TIA symptoms since 04/06/2014 If recurrent stroke symptoms occur, 911 and proceed to the hospital  Will discharge , Neurologically stable [name], [facility], [facility], [facility] [address] [place], [place] [address] [phone]

## 2015-09-07 NOTE — Patient Instructions (Addendum)
Continue Eliquis for secondary stroke prevention and atrial fibrillation Maintain strict control of hypertension with blood pressure goal below 130/90, today's reading 141/63 Maintain lipids with LDL cholesterol goal below 100 mg/dL. continue Lipitor Continue to eat a healthy diet with plenty of whole grains, cereals, fruits and vegetables, exercise regularly and maintain ideal body weight. No further stroke or TIA symptoms since 04/06/2014 If recurrent stroke symptoms occur, 911 and proceed to the hospital  Will discharge

## 2015-09-08 NOTE — Progress Notes (Signed)
I have reviewed and agreed above plan. 

## 2015-09-09 ENCOUNTER — Encounter: Payer: Self-pay | Admitting: Family Medicine

## 2015-09-09 ENCOUNTER — Ambulatory Visit (INDEPENDENT_AMBULATORY_CARE_PROVIDER_SITE_OTHER): Payer: Medicare Other | Admitting: Family Medicine

## 2015-09-09 VITALS — BP 122/64 | HR 60 | Ht 64.75 in | Wt 123.0 lb

## 2015-09-09 DIAGNOSIS — E2839 Other primary ovarian failure: Secondary | ICD-10-CM | POA: Diagnosis not present

## 2015-09-09 DIAGNOSIS — Z0000 Encounter for general adult medical examination without abnormal findings: Secondary | ICD-10-CM | POA: Diagnosis not present

## 2015-09-09 DIAGNOSIS — E785 Hyperlipidemia, unspecified: Secondary | ICD-10-CM

## 2015-09-09 DIAGNOSIS — I4891 Unspecified atrial fibrillation: Secondary | ICD-10-CM | POA: Diagnosis not present

## 2015-09-09 DIAGNOSIS — I10 Essential (primary) hypertension: Secondary | ICD-10-CM | POA: Diagnosis not present

## 2015-09-09 DIAGNOSIS — D649 Anemia, unspecified: Secondary | ICD-10-CM

## 2015-09-09 DIAGNOSIS — E039 Hypothyroidism, unspecified: Secondary | ICD-10-CM

## 2015-09-09 LAB — COMPREHENSIVE METABOLIC PANEL
ALK PHOS: 51 U/L (ref 33–130)
ALT: 14 U/L (ref 6–29)
AST: 18 U/L (ref 10–35)
Albumin: 4.5 g/dL (ref 3.6–5.1)
BILIRUBIN TOTAL: 0.8 mg/dL (ref 0.2–1.2)
BUN: 11 mg/dL (ref 7–25)
CO2: 24 mmol/L (ref 20–31)
Calcium: 9.4 mg/dL (ref 8.6–10.4)
Chloride: 102 mmol/L (ref 98–110)
Creat: 0.56 mg/dL — ABNORMAL LOW (ref 0.60–0.88)
GLUCOSE: 88 mg/dL (ref 65–99)
Potassium: 4.5 mmol/L (ref 3.5–5.3)
Sodium: 135 mmol/L (ref 135–146)
Total Protein: 6.8 g/dL (ref 6.1–8.1)

## 2015-09-09 LAB — CBC WITH DIFFERENTIAL/PLATELET
BASOS PCT: 1 %
Basophils Absolute: 66 cells/uL (ref 0–200)
Eosinophils Absolute: 132 cells/uL (ref 15–500)
Eosinophils Relative: 2 %
HCT: 37 % (ref 35.0–45.0)
Hemoglobin: 12.2 g/dL (ref 11.7–15.5)
LYMPHS PCT: 30 %
Lymphs Abs: 1980 cells/uL (ref 850–3900)
MCH: 29.4 pg (ref 27.0–33.0)
MCHC: 33 g/dL (ref 32.0–36.0)
MCV: 89.2 fL (ref 80.0–100.0)
MONOS PCT: 10 %
MPV: 10.4 fL (ref 7.5–12.5)
Monocytes Absolute: 660 cells/uL (ref 200–950)
Neutro Abs: 3762 cells/uL (ref 1500–7800)
Neutrophils Relative %: 57 %
PLATELETS: 240 10*3/uL (ref 140–400)
RBC: 4.15 MIL/uL (ref 3.80–5.10)
RDW: 15 % (ref 11.0–15.0)
WBC: 6.6 10*3/uL (ref 4.0–10.5)

## 2015-09-09 LAB — LIPID PANEL
CHOL/HDL RATIO: 2.1 ratio (ref <=5.0)
Cholesterol: 174 mg/dL (ref 125–200)
HDL: 82 mg/dL (ref >=46)
LDL CALC: 79 mg/dL (ref <130)
TRIGLYCERIDES: 64 mg/dL (ref <150)
VLDL: 13 mg/dL (ref <30)

## 2015-09-09 NOTE — Progress Notes (Signed)
[name] is a 82 y.o. female who presents for annual wellness visit and follow-up on chronic medical conditions.  She has the following concerns:  States she needs a bone density test. Cannot recall last one. Denies broken bones.  She thinks she is having symptoms related to medications. States she feels tired all the time.  Reports she is staying hydrated, drinks OJ, coffee. Glass of water 10 oz x 2 during the day. Large glass of tea in afternoon and another glass of tea or water at night. Reports eating well, good appetite.  Her daughter lives next door.    Immunization History  Administered Date(s) Administered  . Gamma Globulin 10/09/1983, 12/18/1984, 07/24/1989  . Hepatitis A 10/19/1995  . Hepatitis B 06/08/2009  . IPV 12/07/1969, 12/19/1983, 11/25/1984, 01/08/1986  . Influenza-Unspecified 01/19/2015  . OPV 10/19/1995  . Pneumococcal Conjugate-13 02/09/2015  . Pneumococcal Polysaccharide-23 07/06/2001  . Td 11/13/1978, 01/04/1989, 10/19/1995, 06/08/2009  . Tdap 08/26/2010  . Typhoid Inactivated 10/09/1983, 11/01/1989, 11/11/2002  . Typhoid Live 12/09/1995, 06/08/2009  . Yellow Fever 08/26/2010  . Zoster 09/12/2006   Had left breast removed because of intraductal carcinoma.  Last Pap smear:  Hysterectomy after menopause  Last mammogram: may 30th, 2016 Last colonoscopy: not having one anymore (maybe within the last 2 years) Last DEXA: needs one Dentist: Dr. Maretta Bees - off [address] Ophtho: Dr. Katy Fitch Exercise: yes, walks 3 times per week in the woods for 1.5 hours. Goes to gym 2 days per week.   Other doctors caring for patient include: Cardiology- Dr. Lovena Le Skin- Dr. Jari Pigg Eye-Dr. Loletha Carrow- Dr. Posey Pronto Dentist- Dr. Luretha Rued a PT once a month to work on balance and core- this is self prescribed.   Depression screen:  See questionnaire below.  Depression screen PHQ 2/9 09/09/2015  Decreased Interest 0  Down, Depressed, Hopeless 0  PHQ - 2 Score 0     Fall Risk Screen: see questionnaire below. Fall Risk  09/09/2015  Falls in the past year? No    ADL screen:  See questionnaire below Functional Status Survey: Is the patient deaf or have difficulty hearing?: No Does the patient have difficulty seeing, even when wearing glasses/contacts?: No Does the patient have difficulty concentrating, remembering, or making decisions?: Yes (just the normal with age) Does the patient have difficulty walking or climbing stairs?: Yes (going to PT) Does the patient have difficulty dressing or bathing?: No Does the patient have difficulty doing errands alone such as visiting a doctor's office or shopping?: No   End of Life Discussion:  Patient has a living will and medical power of attorney. She has these at home and will bring them by to be scanned.   Review of Systems Constitutional: -fever, -chills, -sweats, -unexpected weight change, -anorexia, +fatigue Allergy: -sneezing, -itching, -congestion Dermatology: denies changing moles, rash, lumps, new worrisome lesions ENT: -runny nose, -ear pain, -sore throat-sinus pain, -tinnitus, -hearing loss, -epistaxis Cardiology:  -chest pain, + occasional palpitations at night, -edema, -orthopnea, -paroxysmal nocturnal dyspnea Respiratory: -cough, -shortness of breath, -dyspnea on exertion, -wheezing, -hemoptysis Gastroenterology: -abdominal pain, -nausea, -vomiting, -diarrhea, -constipation, -blood in stool, -changes in bowel movement, -dysphagia Hematology: -bleeding or bruising problems, states she does not bruise like when she was taking Aspirin. Musculoskeletal: -arthralgias, -myalgias, -joint swelling, -back pain, -neck pain, -cramping, -gait changes Ophthalmology: -vision changes, -eye redness, -itching, -discharge Urology: -dysuria, -difficulty urinating, -hematuria, -urinary frequency, -urgency, incontinence Neurology: -headache, -weakness, -tingling, -numbness, -speech abnormality, + mild memory loss,  -falls, -dizziness Psychology:  -  depressed mood, -agitation, -sleep problems    PHYSICAL EXAM:  BP 122/64 mmHg  Pulse 60  Ht 5' 4.75" (1.645 m)  Wt 123 lb (55.792 kg)  BMI 20.62 kg/m2  General Appearance: Alert, cooperative, no distress, appears stated age Head: Normocephalic, without obvious abnormality, atraumatic Eyes: PERRL, conjunctiva/corneas clear, EOM's intact, fundi benign Ears: Normal TM's and external ear canals Nose: Nares normal, mucosa normal, no drainage or sinus   tenderness Throat: Lips, mucosa, and tongue normal; teeth and gums normal Neck: Supple, no lymphadenopathy; thyroid: no enlargement/tenderness/nodules; no carotid bruit or JVD Back: Spine nontender, no curvature, ROM normal, no CVA tenderness Lungs: Clear to auscultation bilaterally without wheezes, rales or ronchi; respirations unlabored Chest Wall: No tenderness or deformity Heart: regular rate with irregular rhythm, no murmur, rub or gallop Breast Exam: Deferred, is scheduled for mammogram. No axillary lymphadenopathy Abdomen: Soft, non-tender, nondistended, normoactive bowel sounds, no masses, no hepatosplenomegaly Genitalia: Deferred.  Rectal: deferred, colonoscopy within past 2 years Extremities: No clubbing, cyanosis or edema Pulses: 2+ and symmetric all extremities Skin: Skin color, texture, turgor normal, no rashes or lesions Lymph nodes: Cervical, supraclavicular, and axillary nodes normal Neurologic: CNII-XII intact, normal strength, sensation and gait; reflexes 2+ and symmetric throughout Psych: Normal mood, affect, hygiene and grooming.  ASSESSMENT/PLAN: Medicare annual wellness visit, subsequent - Plan: CBC with Differential/Platelet, Comprehensive metabolic panel  Anemia, unspecified anemia type - Plan: CBC with Differential/Platelet  Essential hypertension - Plan: CBC with Differential/Platelet, Comprehensive metabolic panel  Hypothyroidism, unspecified hypothyroidism type - Plan:  TSH  Dyslipidemia - Plan: Lipid panel  Estrogen deficiency - Plan: DG Bone Density  Atrial fibrillation, unspecified type ([place])  Discussed monthly self breast exams and yearly mammograms; at least 30 minutes of aerobic activity at least 5 days/week and weight-bearing exercise 2x/week; proper sunscreen use reviewed; healthy diet, including goals of calcium and vitamin D intake and alcohol recommendations (less than or equal to 1 drink/day) reviewed; regular seatbelt use; changing batteries in smoke detectors.  Immunization recommendations discussed.  Colonoscopy recommendations reviewed and she is up to date. Bone Density test ordered. Continue taking vitamin D.  Hypothyroidism- continue taking daily levothyroxine for hypothyroidism. TSH ordered.  HTN- well managed. No changes to medications.  A-fib- continue on Eliquis and will follow up with cardiologist.  Dyslipidemia- continue on atorvastatin, lipid panel ordered.  Anemia- will order CBC and start on iron if appropriate.  MMSE- normal    Medicare Attestation I have personally reviewed: The patient's medical and social history Their use of alcohol, tobacco or illicit drugs Their current medications and supplements The patient's functional ability including ADLs,fall risks, home safety risks, cognitive, and hearing and visual impairment Diet and physical activities Evidence for depression or mood disorders  The patient's weight, height, and BMI have been recorded in the chart.  I have made referrals, counseling, and provided education to the patient based on review of the above and I have provided the patient with a written personalized care plan for preventive services.     Harland Dingwall, NP   09/09/2015

## 2015-09-09 NOTE — Patient Instructions (Addendum)
MEDICARE PREVENTATIVE SERVICES (FEMALE) AND PERSONALIZED PLAN for [name] Sep 09, 2015  CONDITIONS OR RISKS IDENTIFIED TODAY: Bring your advanced directives (living will, healthcare power of attorney) by our office and we will scan these into the electronic medical record.   SPECIFIC RECOMMENDATIONS: Continue being active, staying well hydrated, eating a healthy diet.  Continue on medications, no changes.   Influenza vaccine: current Pneumococcal vaccine: up to date Shingles vaccine: up to date Tdap vaccine: current Colonoscopy: within past 2 years. Mammogram: has upcoming appointment.  Pelvic exam: deferred Pap smear: N/A   Return 6 months or sooner if needed.    GENERAL RECOMMENDATIONS FOR GOOD HEALTH:  Supplements:  . Take a daily baby Aspirin 81mg  at bedtime for heart health unless you have a history of gastrointestinal bleed, allergy to aspirin, or are already taking higher dose Aspirin or other antiplatelet or blood thinner medication.   . Consume 1200 mg of Calcium daily through dietary calcium or supplement if you are female age 22 or older, or men 70 and older.   Men aged 54-70 should consume 1000 mg of Calcium daily. . Take 600 IU of Vitamin D daily.  Take 800 IU of Calcium daily if you are older than age 59.  . Take a general multivitamin daily.   Healthy diet: Eat a variety of foods, including fruits, vegetables, vegetable protein such as beans, lentils, tofu, and grains, such as rice.  Limit meat or animal protein, but if you eat meat, choose leans cuts such as chicken, fish, or [place].  Drink plenty of water daily.  Decrease saturated fat in the diet, avoid lots of red meat, processed foods, sweets, fast foods, and fried foods.  Limit salt and caffeine intake.  Exercise: Aerobic exercise helps maintain good heart health. Weight bearing exercise helps keep bones and muscles working strong.  We recommend at least 30-40 minutes of exercise most days of the week.    Fall prevention: Falls are the leading cause of injuries, accidents, and accidental deaths in people over the age of 66. Falling is a real threat to your ability to live on your own.  Causes include poor eyesight or poor hearing, illness, poor lighting, throw rugs, clutter in your home, and medication side effects causing dizziness or balance problems.  Such medications can include medications for depression, sleep problems, high blood pressure, diabetes, and heart conditions.   PREVENTION  Be sure your home is as safe as possible. Here are some tips:  Wear shoes with non-skid soles (not house slippers).   Be sure your home and outside area are well lit.   Use night lights throughout your house, including hallways and stairways.   Remove clutter and clean up spills on floors and walkways.   Remove throw rugs or fasten them to the floor with carpet tape. Tack down carpet edges.   Do not place electrical cords across pathways.   Install grab bars in your bathtub, shower, and toilet area. Towel bars should not be used as a grab bar.   Install handrails on both sides of stairways.   Do not climb on stools or stepladders. Get someone else to help with jobs that require climbing.   Do not wax your floors at all, or use a non-skid wax.   Repair uneven or unsafe sidewalks, walkways or stairs.   Keep frequently used items within reach.   Be aware of pets so you do not trip.  Get regular check-ups from your doctor, and take  good care of yourself:  Have your eyes checked every year for vision changes, cataracts, glaucoma, and other eye problems. Wear eyeglasses as directed.   Have your hearing checked every 2 years, or anytime you or others think that you cannot hear well. Use hearing aids as directed.   See your caregiver if you have foot pain or corns. Sore feet can contribute to falls.   Let your caregiver know if a medicine is making you feel dizzy or making you lose your balance.    Use a cane, walker, or wheelchair as directed. Use walker or wheelchair brakes when getting in and out.   When you get up from bed, sit on the side of the bed for 1 to 2 minutes before you stand up. This will give your blood pressure time to adjust, and you will feel less dizzy.   If you need to go to the bathroom often, consider using a bedside commode.  Disease prevention:  If you smoke or chew tobacco, find out from your caregiver how to quit. It can literally save your life, no matter how long you have been a tobacco user. If you do not use tobacco, never begin. Medicare does cover some smoking cessation counseling.  Maintain a healthy diet and normal weight. Increased weight leads to problems with blood pressure and diabetes. We check your height, weight, and BMI as part of your yearly visit.  The Body Mass Index or BMI is a way of measuring how much of your body is fat. Having a BMI above 27 increases the risk of heart disease, diabetes, hypertension, stroke and other problems related to obesity. Your caregiver can help determine your BMI and based on it develop an exercise and dietary program to help you achieve or maintain this important measurement at a healthful level.  High blood pressure causes heart and blood vessel problems.  Persistent high blood pressure should be treated with medicine if weight loss and exercise do not work.  We check your blood pressure as part of your yearly visit.  Avoid drinking alcohol in excess (more than two drinks per day).  Avoid use of street drugs. Do not share needles with anyone. Ask for professional help if you need assistance or instructions on stopping the use of alcohol, cigarettes, and/or drugs.  Brush your teeth twice a day with fluoride toothpaste, and floss once a day. Good oral hygiene prevents tooth decay and gum disease. The problems can be painful, unattractive, and can cause other health problems. Visit your dentist for a routine oral  and dental checkup and preventive care every 6-12 months.   See your eye doctor yearly for routine screening for things like glaucoma.  Look at your skin regularly.  Use a mirror to look at your back. Notify your caregivers of changes in moles, especially if there are changes in shapes, colors, a size larger than a pencil eraser, an irregular border, or development of new moles.  Safety:  Use seatbelts 100% of the time, whether driving or as a passenger.  Use safety devices such as hearing protection if you work in environments with loud noise or significant background noise.  Use safety glasses when doing any work that could send debris in to the eyes.  Use a helmet if you ride a bike or motorcycle.  Use appropriate safety gear for contact sports.  Talk to your caregiver about gun safety.  Use sunscreen with a SPF (or skin protection factor) of 15 or greater.  Lighter skinned people are at a greater risk of skin cancer. Don't forget to also wear sunglasses in order to protect your eyes from too much damaging sunlight. Damaging sunlight can accelerate cataract formation.   If you have multiple sexual partners, or if you are not in a monogamous relationship, practice safe sex. Use condoms. Condoms are used to help reduce the spread of sexually transmitted infections (or STIs).  Consider an HIV test if you have never been tested.  Consider routine screening for STIs if you have multiple sexual partners.   Keep carbon monoxide and smoke detectors in your home functioning at all times. Change the batteries every 6 months or use a model that plugs into the wall or is hard wired in.   END OF LIFE PLANNING/ADVANCED DIRECTIVES Advance health-care planning is deciding the kind of care you want at the end of life. While alert competent adults are able to exercise their rights to make health care and financial decisions, problems arise when an individual becomes unconscious, incapacitated, or otherwise unable  to communicate or make such decisions. Advance health care directives are the legal documents in which you give written instructions about your choices limited, aggressive or palliative care if, in the future, you cannot speak for yourself.  Advanced directives include the following: [place] allows you to appoint someone to act as your health care agent to make health care decisions for you should it be determined by your health care provider that you are no longer able to make these decisions for yourself.  A Living Will is a legal document in which you can declare that under certain conditions you desire your life not be prolonged by extraordinary or artificial means during your last illness or when you are near death. We can provide you with sample advanced directives, you can get an attorney to prepare these for you, or you can visit [place] Secretary of State's website for additional information and resources at [email or web address]  Further, I recommend you have an attorney prepare a Will and Durable Power of Attorney if you haven't done so already.  Please get [place] a copy of your health care Advanced Directives.   PREVENTATIV E CARE RECOMMENDATIONS:  Vaccinations: We recommend the following vaccinations as part of your preventative care:  Pneumococcal vaccine is recommended to protect against certain types of pneumonia.  This is normally recommended for adults age 36 or older once, or up to every 5 years for those at high risk.  The vaccine is also recommended for adults younger than 80 years old with certain underlying conditions that make them high risk for pneumonia.  Influenza vaccine is recommended to protect against seasonal influenza or "the flu." Influenza is a serious disease that can lead to hospitalization and sometimes even death. Traditional flu vaccines (called trivalent vaccines) are made to protect against three flu viruses; an influenza A  (H1N1) virus, an influenza A (H3N2) virus, and an influenza B virus. In addition, there are flu vaccines made to protect against four flu viruses (called "quadrivalent" vaccines). These vaccines protect against the same viruses as the trivalent vaccine and an additional B virus.  We recommend the high dose influenza vaccine to those 65 years and older.  Hepatitis B vaccine to protect against a form of infection of the liver by a virus acquired from blood or body fluids, particularly for high risk groups.  Td or Tdap vaccine to protect against Tetanus, diphtheria and pertussis which can be  very serious.  These diseases are caused by bacteria.  Diphtheria and pertussis are spread from person to person through coughing or sneezing.  Tetanus enters the body through cuts, scratches, or wounds.  Tetanus (Lockjaw) causes painful muscle tightening and stiffness, usually all over the body.  Diphtheria can cause a thick coating to form in the back of the throat.  It can lead to breathing problems, paralysis, heart failure, and death.  Pertussis (Whooping Cough) causes severe coughing spells, which can cause difficulty breathing, vomiting and disturbed sleep.  Td or Tdap is usually given every 10 years.  Shingles vaccine to protect against Varicella Zoster if you are older than age 32, or younger than 80 years old with certain underlying illness.    Cancer Screening: Most routine colon cancer screening begins at the age of 36.  Subsequent colonoscopies are performed either every 5-10 years for normal screening, or every 2-5 years for higher risks patients, up until age 59 years of age. Annual screening is done with easy to use take-home tests to check for hidden blood in the stool called hemoccult tests.  Sigmoidoscopy or colonoscopy can detect the earliest forms of colon cancer and is life saving. These tests use a small camera at the end of a tube to directly examine the colon.   Pelvic Exam and Pap  Smear: Pelvic exams and pap smears are performed routinely to evaluate for abnormalities as well as cancers including cervical and vaginal cancers.  This is generally performed every 2-3 years for most women, or more frequently for higher risk patients.  Mammograms: Mammograms are used to screen for breast cancer.  Medicare covers baseline screening once from ages 46-49 years old, but will cover mammograms yearly for those 40 years and older.  In accordance with other guidelines, you may not need a mammogram every year though.  The decision on how frequently you need a mammogram should be discussed with you medical provider.    Osteoporosis Screening: Screening for osteoporosis usually begins at age 65 for women, and can be done as frequent as every 2 years.  However, women or men with higher risk of osteoporosis may be screened earlier than age 16.  Osteoporosis or low bone mass is diminished bone strength from alterations in bone architecture leading to bone fragility and increased fracture risk.     Cardiovascular Screening: Fat and cholesterol leaves deposits in your arteries that can block them. This causes heart disease and vessel disease elsewhere in your body.  If your cholesterol is found to be high, or if you have heart disease or certain other medical conditions, then you may need to have your cholesterol monitored frequently and be treated with medication. Cardiovascular screening in the form of lab tests for cholesterol, HDL and triglycerides can be done every 5 years.  A screening electrocardiogram can be done as part of the Welcome to Medicare physical.  Diabetes Screening: Diabetes screening can be done at least every 3 years for those with risk factors,  or every 6-31months for prediabetic patients.  Screening includes fasting blood sugar test or glucose tolerance test.  Risk factors include hypertension, dyslipidemia, obesity, previously abnormal glucose tests, family history of  diabetes, age 38 years or older, and history of gestations diabetes.   AAA (abdominal aortic aneurysm) Screening: Medicare allows for a one time ultrasound to screen for abdominal aortic aneurysm if done as a referral as part of the Welcome to Medicare exam.  Men eligible for this screening include those  men between age 98-46 years of age who have smoked at least 100 cigarettes in his lifetime and/or has a family history of AAA.  HIV Screening:  Medicare allows for yearly screening for patients at high risk for contracting HIV disease.

## 2015-09-10 LAB — TSH: TSH: 2.18 mIU/L

## 2015-09-16 ENCOUNTER — Encounter: Payer: Self-pay | Admitting: Family Medicine

## 2015-09-27 ENCOUNTER — Encounter: Payer: Self-pay | Admitting: Family Medicine

## 2015-09-28 ENCOUNTER — Inpatient Hospital Stay: Admission: RE | Admit: 2015-09-28 | Payer: Medicare Other | Source: Ambulatory Visit

## 2015-09-28 LAB — HM DEXA SCAN

## 2015-09-28 LAB — HM MAMMOGRAPHY

## 2015-09-29 ENCOUNTER — Encounter: Payer: Self-pay | Admitting: Family Medicine

## 2015-10-07 ENCOUNTER — Encounter: Payer: Medicare Other | Admitting: Family Medicine

## 2015-10-12 ENCOUNTER — Ambulatory Visit (INDEPENDENT_AMBULATORY_CARE_PROVIDER_SITE_OTHER): Payer: Medicare Other | Admitting: Internal Medicine

## 2015-10-12 ENCOUNTER — Encounter: Payer: Self-pay | Admitting: Internal Medicine

## 2015-10-12 VITALS — BP 150/72 | HR 54 | Ht 65.0 in | Wt 127.0 lb

## 2015-10-12 DIAGNOSIS — I480 Paroxysmal atrial fibrillation: Secondary | ICD-10-CM

## 2015-10-12 LAB — CUP PACEART INCLINIC DEVICE CHECK: MDC IDC SESS DTM: 20170606124504

## 2015-10-12 MED ORDER — APIXABAN 2.5 MG PO TABS: 2.5000 mg | ORAL_TABLET | Freq: Two times a day (BID) | ORAL | Status: DC

## 2015-10-12 NOTE — Progress Notes (Signed)
HPI [name] returns today for ongoing evaluation of her ILR for cryptogenic stroke.  The patient has been found to have atrial fib and been started on Eliquis. She wonders if she can have her dose reduced. She does not have syncope or near syncope.  Allergies  Allergen Reactions  . Epinephrine Other (See Comments)     Reaction:  Caused pt to scream uncontrollably.     Current Outpatient Prescriptions  Medication Sig Dispense Refill  . amLODipine (NORVASC) 5 MG tablet Take 1 tablet (5 mg total) by mouth every morning. 90 tablet 3  . apixaban (ELIQUIS) 5 MG TABS tablet Take 1 tablet (5 mg total) by mouth 2 (two) times daily. 180 tablet 3  . atorvastatin (LIPITOR) 10 MG tablet Take 0.5 tablets (5 mg total) by mouth daily. 45 tablet 3  . Coenzyme Q10 10 MG capsule Take 10 mg by mouth daily.     Marland Kitchen levothyroxine (SYNTHROID, LEVOTHROID) 100 MCG tablet Take 100 mcg by mouth daily.     Marland Kitchen lisinopril (PRINIVIL,ZESTRIL) 10 MG tablet Take 1 tablet (10 mg total) by mouth daily. 90 tablet 3  . methylcellulose (ARTIFICIAL TEARS) 1 % ophthalmic solution Place 1 drop into both eyes daily as needed (for dry eyes).     . Multiple Vitamins-Iron (MULTIVITAMINS WITH IRON) TABS tablet Take 1 tablet by mouth daily.     . Omega-3 Fatty Acids (FISH OIL) 1200 MG CAPS Take 1,200 mg by mouth 2 (two) times daily.     . Vitamin D, Cholecalciferol, 1000 UNITS TABS Take 1 tablet by mouth daily with supper.     No current facility-administered medications for this visit.     Past Medical History  Diagnosis Date  . Hypothyroidism 3-21- 13    tx. levothyroxine  . Blood transfusion feb 2014    post surgery some time ago  . Arthritis 07-27-11    osteoarthritis-hips/s/p bil. THA, arthritis right hand   . Hypertension   . Anemia     hx of with surgery   . Hard of hearing     both ears mild loss  . Complication of anesthesia     epinephrine - screams uncontrollably / pt does not want general anesthesia  or narcotics  . Cold hands   . Osteopenia   . Constipation   . Cancer ([place]) 1989    Breast cancer -s/p lt. mastectomy, some squamous cell lesions  . Fuchs' corneal dystrophy     both eyes  . Stroke [facility])     a. s/p MDT LINQ 04/2014  . Lichen sclerosus et atrophicus of the vulva     pt reported.     ROS:   All systems reviewed and negative except as noted in the HPI.   Past Surgical History  Procedure Laterality Date  . Ankle surgery  2005    right ankle tendon repair  . Cataract extraction, bilateral  few yrs ago    Bilateral  . Squamous cell carcinoma excision  07-27-11    x2 left shoulder  . Orif femur fracture  07/31/2011    Procedure: OPEN REDUCTION INTERNAL FIXATION (ORIF) DISTAL FEMUR FRACTURE;  Surgeon: Tiffany Alf, MD;  Location: [facility];  Service: Orthopedics;  Laterality: Right;  right femur  (c-arm)   . Hardware removal  04/05/2012    Procedure: HARDWARE REMOVAL;  Surgeon: Tiffany Alf, MD;  Location: [facility];  Service: Orthopedics;  Laterality: Right;  Removal of Hardware Right Femur   .  Total hip revision  04/10/2012    Procedure: TOTAL HIP REVISION;  Surgeon: Tiffany Alf, MD;  Location: [facility];  Service: Orthopedics;  Laterality: Right;  . Breast surgery  07-1987    lt.breast cancer intraductal cancer, mastectomy  . Joint replacement  07-27-11    Bilateral: Right THA - 04/2000, Left THA 06/2001  . Left hip replacement Left feb 2003  . Total hip revision Left 06/27/2012    Procedure: LEFT TOTAL HIP REVISION;  Surgeon: Tiffany Alf, MD;  Location: [facility];  Service: Orthopedics;  Laterality: Left;  Marland Kitchen Mastectomy  1989    left - Pt states has no restrictions on use of L arm for BP or blood draw  . Hardware removal Left 06/30/2013    Procedure: LEFT HIP HARDWARE REMOVAL OF CABLES;  Surgeon: Tiffany Alf, MD;  Location: [facility];  Service: Orthopedics;  Laterality: Left;  Marland Kitchen Melanoma removed Left     x 1  . Basal cell removed from face      3-4 times  .  Abdominal hysterectomy      ovaries removed  . Hardware removal Right 08/21/2013    Procedure: RIGHT HIP HARDWARE REMOVAL;  Surgeon: Tiffany Alf, MD;  Location: [facility];  Service: Orthopedics;  Laterality: Right;  . Loop recorder implant  04-08-2014    MDT LINQ implanted by Dr [name] for cryptogenic stroke     Family History  Problem Relation Age of Onset  . Hodgkin's lymphoma Mother   . Celiac disease Mother   . Cancer Mother   . Melanoma Father   . Cancer Father   . Heart disease Paternal Grandmother   . Heart disease Paternal Grandfather   . Cancer Brother   . Arthritis Sister      Social History   Social History  . Marital Status: Single    Spouse Name: N/A  . Number of Children: 1  . Years of Education: N/A   Occupational History  . Retired    Social History Main Topics  . Smoking status: Former Smoker -- 1.00 packs/day for 10 years    Types: Cigarettes    Quit date: 05/09/1963  . Smokeless tobacco: Never Used  . Alcohol Use: 0.6 oz/week    1 Glasses of wine per week     Comment: very little  . Drug Use: No  . Sexual Activity: No   Other Topics Concern  . Not on file   Social History Narrative   Lives alone.     BP 150/72 mmHg  Pulse 54  Ht 5\' 5"  (1.651 m)  Wt 127 lb (57.607 kg)  BMI 21.13 kg/m2  SpO2 98%  Physical Exam:  Well appearing 82 year old woman, NAD HEENT: Unremarkable Neck:  6 cm JVD, no thyromegally Back:  No CVA tenderness Lungs:  Clear with no wheezes, rales, or rhonchi. HEART:  Regular rate rhythm, no murmurs, no rubs, no clicks Abd:  soft, positive bowel sounds, no organomegally, no rebound, no guarding Ext:  2 plus pulses, no edema, no cyanosis, no clubbing Skin:  No rashes no nodules Neuro:  CN II through XII intact, motor grossly intact  ILR - episodes of atrial fib lasting up to 3 hours, nocturnal pauses of over 5 seconds, asymptomatic   Assess/Plan: 1. Stroke - she has minimal residual and is now on chronic  anti-coagulation.  2. HTN - her blood pressure is a bit high today but she notes that it is normally well controlled. 3.  Sinus node dysfunction - she is asymptomatic. She has nocturnal pauses but none during the day. She will undergo watchful waiting. I have cautioned her to call us if she has any symptoms. 4. PAF - she is mostly maintaining NSR. Will follow.  Mikle Bosworth.D.

## 2015-10-12 NOTE — Patient Instructions (Addendum)
Medication Instructions:  Your physician has recommended you make the following change in your medication:  1) Decrease Eliquis to 2.5 mg twice dialy    Labwork: None ordered   Testing/Procedures: None ordered   Follow-Up: Your physician wants you to follow-up in: 6 months in device clinic and 12 months with Dr Knox Saliva will receive a reminder letter in the mail two months in advance. If you don't receive a letter, please call our office to schedule the follow-up appointment.   Any Other Special Instructions Will Be Listed Below (If Applicable).     If you need a refill on your cardiac medications before your next appointment, please call your pharmacy.

## 2015-11-08 ENCOUNTER — Telehealth: Payer: Self-pay | Admitting: Family Medicine

## 2015-11-08 NOTE — Telephone Encounter (Signed)
Spoke to patient, i am not sure who called her as I did not and she did not remember who it was and i do not see anything in system

## 2015-11-08 NOTE — Telephone Encounter (Signed)
Rcd voicemail from [place] stating we had called her.  I see no notations in chart.  Gabriel Cirri please call pt to see if you can help her [phone]

## 2015-11-12 ENCOUNTER — Encounter: Payer: Self-pay | Admitting: Internal Medicine

## 2015-11-24 ENCOUNTER — Telehealth: Payer: Self-pay | Admitting: Family Medicine

## 2015-11-24 NOTE — Telephone Encounter (Signed)
I think if her symptoms continue then she should be seen.

## 2015-11-24 NOTE — Telephone Encounter (Signed)
Left a message that if her symtpoms continue she will need to be seen

## 2015-11-24 NOTE — Telephone Encounter (Signed)
Pt called and states that she thinks she has [place] disease, she is having swollen glands, headaches, and her eyelids are twitching, wants to know what you think before making a  appt, she is getting ready to go out of town, she states that she is been like this since sat night.  She got bite by a bunch of mosquitos. Pt can be reached at [phone] (M

## 2015-11-25 ENCOUNTER — Encounter: Payer: Self-pay | Admitting: Family Medicine

## 2016-01-14 ENCOUNTER — Other Ambulatory Visit: Payer: Self-pay | Admitting: Internal Medicine

## 2016-01-14 ENCOUNTER — Telehealth: Payer: Self-pay | Admitting: Family Medicine

## 2016-01-14 NOTE — Telephone Encounter (Signed)
Recv'd fax request from [facility] req refill Levothyroxine (no dosage on form)

## 2016-01-14 NOTE — Telephone Encounter (Signed)
Pt called for refill of Levothyroxine. Please send to [facility]. Pt can be reached at [phone].

## 2016-01-14 NOTE — Telephone Encounter (Signed)
amLODipine (NORVASC) 5 MG tablet  Medication  Date: 05/11/2015 Department: [facility] Office Ordering/Authorizing: Evans Lance, MD  Order Providers   Prescribing Provider Encounter Provider  Evans Lance, MD Roberts Gaudy, CMA  Medication Detail    Disp Refills Start End   amLODipine (NORVASC) 5 MG tablet 90 tablet 3 05/11/2015    Sig - Route: Take 1 tablet (5 mg total) by mouth every morning. - Oral   E-Prescribing Status: Receipt confirmed by pharmacy (05/11/2015 2:23 PM EST)   Pharmacy   [facility] - [place], [place]

## 2016-01-16 NOTE — Telephone Encounter (Signed)
Please find out the correct dose for her Levothyroxine and refill it for her. Give her 2 additional refills and she will need to have a medcheck appt in November or December. j

## 2016-01-17 ENCOUNTER — Other Ambulatory Visit: Payer: Self-pay | Admitting: Family Medicine

## 2016-01-17 MED ORDER — LEVOTHYROXINE SODIUM 100 MCG PO TABS: 100.0000 ug | ORAL_TABLET | Freq: Every day | ORAL | 2 refills | Status: DC

## 2016-01-17 NOTE — Telephone Encounter (Signed)
Pt advised 100 mcg Levothyroxine to [facility] with 2 refills

## 2016-02-16 ENCOUNTER — Other Ambulatory Visit: Payer: Self-pay | Admitting: Internal Medicine

## 2016-03-14 ENCOUNTER — Ambulatory Visit: Payer: Medicare Other | Admitting: Family Medicine

## 2016-04-13 ENCOUNTER — Ambulatory Visit (INDEPENDENT_AMBULATORY_CARE_PROVIDER_SITE_OTHER): Payer: Medicare Other | Admitting: *Deleted

## 2016-04-13 DIAGNOSIS — I639 Cerebral infarction, unspecified: Secondary | ICD-10-CM

## 2016-04-13 LAB — CUP PACEART INCLINIC DEVICE CHECK
Implantable Pulse Generator Implant Date: 20151202
MDC IDC SESS DTM: 20171207101559

## 2016-04-13 NOTE — Progress Notes (Signed)
Loop check in clinic. Battery status: good. R-waves 0.7mV. 0 symptom episodes, 0 tachy episodes, 1 pause episodes lasting 4sec. 0 brady episodes. 3 AF episodes (0.3% burden). Monthly summary reports and ROV with GT in 10/2016

## 2016-04-20 ENCOUNTER — Telehealth: Payer: Self-pay

## 2016-04-20 NOTE — Telephone Encounter (Signed)
Called, spoke with pt. Informed Dr. Lovena Le advised if pt stops Lisinopril she will likely need something else to control BP. Asked if pt had a reason to stop taking it, any adverse symptoms. Pt stated, "no, just wanted to know if I should continue taking it". Pt advised she will continue taking the medication. Informed to call our office with further questions or concerns. Pt verbalized understand and agreed with plan.

## 2016-04-26 ENCOUNTER — Telehealth: Payer: Self-pay | Admitting: Family Medicine

## 2016-04-26 ENCOUNTER — Telehealth: Payer: Self-pay | Admitting: Internal Medicine

## 2016-04-26 ENCOUNTER — Encounter: Payer: Self-pay | Admitting: Internal Medicine

## 2016-04-26 NOTE — Telephone Encounter (Signed)
Please call her and make sure she knows to take 325 mg of Tylenol and no more than 650 mg every 6 hours. I did not specify the dose.

## 2016-04-26 NOTE — Telephone Encounter (Signed)
Pt called and stated that she is having shoulder pain and would like to know what she can take OTC. Pt was offered an appt but she declined. Please call pt at [phone].

## 2016-04-26 NOTE — Telephone Encounter (Signed)
[name] is calling to find out if she can take over-the-counter medicine, such as Aleve and Ibuprofen. Please call. Thanks.

## 2016-04-26 NOTE — Telephone Encounter (Signed)
Called, spoke with pt. Informed to check with PCP for medication r/t right arm pain. Informed it is not recommended to take NSAIDs with Eliquis. Pt verbalized understanding and agreed with plan.

## 2016-04-26 NOTE — Telephone Encounter (Signed)
Pt coming in tomorrow as tylenol is not working

## 2016-04-27 ENCOUNTER — Ambulatory Visit (INDEPENDENT_AMBULATORY_CARE_PROVIDER_SITE_OTHER): Payer: Medicare Other | Admitting: Family Medicine

## 2016-04-27 ENCOUNTER — Encounter: Payer: Self-pay | Admitting: Family Medicine

## 2016-04-27 VITALS — BP 110/70 | HR 56 | Wt 123.6 lb

## 2016-04-27 DIAGNOSIS — M25511 Pain in right shoulder: Secondary | ICD-10-CM

## 2016-04-27 DIAGNOSIS — G8929 Other chronic pain: Secondary | ICD-10-CM

## 2016-04-27 MED ORDER — TRIAMCINOLONE ACETONIDE 40 MG/ML IJ SUSP
40.0000 mg | Freq: Once | INTRAMUSCULAR | Status: AC
  Administered 2016-04-27: 40 mg via INTRAMUSCULAR

## 2016-04-27 MED ORDER — LIDOCAINE HCL (PF) 1 % IJ SOLN
3.0000 mL | Freq: Once | INTRAMUSCULAR | Status: AC
  Administered 2016-04-27: 3 mL

## 2016-04-27 NOTE — Progress Notes (Signed)
   Subjective:    Patient ID: [name], female    DOB: 08-15-1931, 82 y.o.   MRN: [id number]  HPI Chief Complaint  Patient presents with  . shoulder pain    shoulder pain- hurts to play the voiolin and hurts when she lays down   She is here with complaints of right shoulder pain for the past 3 months. State pain is progressively worsening. Pain is mainly posterior and worse when playing her violin and laying on her right side. Pain is improved with rest. Denies history of injury or surgery to shoulder.   Has tried Tylenol extra strength for past month with minimal relief.  States she has also been going to PT and has tried dry needling and has been doing muscle strengthening for at least 2 weeks. Has also had massage therapy.   Denies fever, chills, numbness, tingling, weakness, nausea, vomiting, diarrhea.   Reviewed allergies, medications, past medical, surgical,and social history.   Review of Systems Pertinent positives and negatives in the history of present illness.     Objective:   Physical Exam  Constitutional: She appears well-developed and well-nourished. No distress.  Neck: Normal range of motion. Neck supple.  Musculoskeletal:       Right shoulder: She exhibits pain. She exhibits normal range of motion, no tenderness, no bony tenderness, no swelling, no effusion and normal strength.  Pain to posterior subacromial region with abduction   No erythema, edema or tenderness. Normal ROM and strength. No atrophy.   Negative drop arm, empty can. Negative sulcus and neers/hawkins.  RUE is neurovascularly intact.    BP 110/70   Pulse (!) 56   Wt 123 lb 9.6 oz (56.1 kg)   BMI 20.57 kg/m      Assessment & Plan:  Chronic right shoulder pain - Plan: triamcinolone acetonide (KENALOG-40) injection 40 mg, lidocaine (PF) (XYLOCAINE) 1 % injection 3 mL  Discussed that she does not appear to have had much improvement with conservative therapy. Discussed options such as  continuing PT and steroid injection or referral to orthopedist for further evaluation and treatment. Patient opted for an injection today and Dr. Redmond School was available to do this. Verbal consent obtained.  The area was cleaned with betadine, a 10 mL syringe with a 21 gauge needle was used. 3 mL of xylocaine and 1 mL of kenalog was injected into the subacromial bursa without complication or difficulty. Patient tolerated procedure well and notice immediate improvement in symptoms.  She will follow up if symptoms worsen.

## 2016-04-30 ENCOUNTER — Encounter: Payer: Self-pay | Admitting: Family Medicine

## 2016-05-02 ENCOUNTER — Encounter: Payer: Self-pay | Admitting: Family Medicine

## 2016-05-11 ENCOUNTER — Other Ambulatory Visit: Payer: Self-pay | Admitting: Family Medicine

## 2016-05-11 DIAGNOSIS — I10 Essential (primary) hypertension: Secondary | ICD-10-CM

## 2016-05-13 ENCOUNTER — Other Ambulatory Visit: Payer: Self-pay | Admitting: Internal Medicine

## 2016-05-13 DIAGNOSIS — I10 Essential (primary) hypertension: Secondary | ICD-10-CM

## 2016-08-19 ENCOUNTER — Other Ambulatory Visit: Payer: Self-pay | Admitting: Internal Medicine

## 2016-08-19 DIAGNOSIS — I480 Paroxysmal atrial fibrillation: Secondary | ICD-10-CM

## 2016-08-21 NOTE — Telephone Encounter (Signed)
Age 83 Wt 57.6 (10/12/2015) Saw Dr Lovena Le 10/12/2015  09/09/2015 Creat 0.56 09/09/2015 Hgb 12.2 HCT 37.0 Refill done for Eliquis 2.5mg  q 12 hours

## 2016-08-28 ENCOUNTER — Other Ambulatory Visit: Payer: Self-pay | Admitting: *Deleted

## 2016-08-28 DIAGNOSIS — I10 Essential (primary) hypertension: Secondary | ICD-10-CM

## 2016-08-28 MED ORDER — LISINOPRIL 10 MG PO TABS: 10.0000 mg | ORAL_TABLET | Freq: Every day | ORAL | 0 refills | Status: DC

## 2016-09-11 ENCOUNTER — Telehealth: Payer: Self-pay | Admitting: Family Medicine

## 2016-09-11 ENCOUNTER — Other Ambulatory Visit: Payer: Self-pay | Admitting: Family Medicine

## 2016-09-11 DIAGNOSIS — I10 Essential (primary) hypertension: Secondary | ICD-10-CM

## 2016-09-11 DIAGNOSIS — Z79899 Other long term (current) drug therapy: Secondary | ICD-10-CM

## 2016-09-11 DIAGNOSIS — E785 Hyperlipidemia, unspecified: Secondary | ICD-10-CM

## 2016-09-11 DIAGNOSIS — E039 Hypothyroidism, unspecified: Secondary | ICD-10-CM

## 2016-09-11 DIAGNOSIS — Z0000 Encounter for general adult medical examination without abnormal findings: Secondary | ICD-10-CM

## 2016-09-11 NOTE — Telephone Encounter (Signed)
Left message for pt

## 2016-09-11 NOTE — Telephone Encounter (Signed)
She can come in tomorrow for fasting labs. I will put in orders.

## 2016-09-11 NOTE — Telephone Encounter (Signed)
Pt called states she has CPE on Thursday and she is requesting to be able to come in tomorrow morning for her fasting labs.  Please advise is this is ok and if so please put in orders

## 2016-09-12 ENCOUNTER — Other Ambulatory Visit: Payer: Medicare Other

## 2016-09-12 ENCOUNTER — Ambulatory Visit: Payer: Medicare Other | Admitting: Family Medicine

## 2016-09-12 DIAGNOSIS — E039 Hypothyroidism, unspecified: Secondary | ICD-10-CM

## 2016-09-12 DIAGNOSIS — E785 Hyperlipidemia, unspecified: Secondary | ICD-10-CM

## 2016-09-12 DIAGNOSIS — Z79899 Other long term (current) drug therapy: Secondary | ICD-10-CM

## 2016-09-12 DIAGNOSIS — R5383 Other fatigue: Secondary | ICD-10-CM

## 2016-09-12 DIAGNOSIS — I10 Essential (primary) hypertension: Secondary | ICD-10-CM

## 2016-09-12 LAB — COMPREHENSIVE METABOLIC PANEL
ALK PHOS: 55 U/L (ref 33–130)
ALT: 14 U/L (ref 6–29)
AST: 19 U/L (ref 10–35)
Albumin: 4.8 g/dL (ref 3.6–5.1)
BUN: 15 mg/dL (ref 7–25)
CALCIUM: 9.8 mg/dL (ref 8.6–10.4)
CHLORIDE: 101 mmol/L (ref 98–110)
CO2: 24 mmol/L (ref 20–31)
Creat: 0.76 mg/dL (ref 0.60–0.88)
GLUCOSE: 84 mg/dL (ref 65–99)
POTASSIUM: 4.2 mmol/L (ref 3.5–5.3)
Sodium: 137 mmol/L (ref 135–146)
Total Bilirubin: 0.8 mg/dL (ref 0.2–1.2)
Total Protein: 7.4 g/dL (ref 6.1–8.1)

## 2016-09-12 LAB — CBC WITH DIFFERENTIAL/PLATELET
BASOS ABS: 55 {cells}/uL (ref 0–200)
Basophils Relative: 1 %
Eosinophils Absolute: 55 cells/uL (ref 15–500)
Eosinophils Relative: 1 %
HCT: 40.1 % (ref 35.0–45.0)
Hemoglobin: 13.2 g/dL (ref 11.7–15.5)
LYMPHS PCT: 26 %
Lymphs Abs: 1430 cells/uL (ref 850–3900)
MCH: 30.3 pg (ref 27.0–33.0)
MCHC: 32.9 g/dL (ref 32.0–36.0)
MCV: 92.2 fL (ref 80.0–100.0)
MONOS PCT: 12 %
MPV: 9.5 fL (ref 7.5–12.5)
Monocytes Absolute: 660 cells/uL (ref 200–950)
NEUTROS PCT: 60 %
Neutro Abs: 3300 cells/uL (ref 1500–7800)
PLATELETS: 253 10*3/uL (ref 140–400)
RBC: 4.35 MIL/uL (ref 3.80–5.10)
RDW: 14.8 % (ref 11.0–15.0)
WBC: 5.5 10*3/uL (ref 4.0–10.5)

## 2016-09-12 LAB — LIPID PANEL
CHOL/HDL RATIO: 2.3 ratio (ref <5.0)
Cholesterol: 212 mg/dL — ABNORMAL HIGH (ref <200)
HDL: 93 mg/dL (ref >50)
LDL CALC: 106 mg/dL — AB (ref <100)
TRIGLYCERIDES: 65 mg/dL (ref <150)
VLDL: 13 mg/dL (ref <30)

## 2016-09-12 LAB — TSH: TSH: 3.48 mIU/L

## 2016-09-13 LAB — VITAMIN D 25 HYDROXY (VIT D DEFICIENCY, FRACTURES): Vit D, 25-Hydroxy: 41 ng/mL (ref 30–100)

## 2016-09-14 ENCOUNTER — Ambulatory Visit (INDEPENDENT_AMBULATORY_CARE_PROVIDER_SITE_OTHER): Payer: Medicare Other | Admitting: Family Medicine

## 2016-09-14 ENCOUNTER — Encounter: Payer: Self-pay | Admitting: Family Medicine

## 2016-09-14 VITALS — BP 132/80 | HR 52 | Resp 16 | Ht 65.0 in | Wt 123.6 lb

## 2016-09-14 DIAGNOSIS — I10 Essential (primary) hypertension: Secondary | ICD-10-CM | POA: Diagnosis not present

## 2016-09-14 DIAGNOSIS — Z0000 Encounter for general adult medical examination without abnormal findings: Secondary | ICD-10-CM | POA: Diagnosis not present

## 2016-09-14 DIAGNOSIS — E785 Hyperlipidemia, unspecified: Secondary | ICD-10-CM | POA: Diagnosis not present

## 2016-09-14 DIAGNOSIS — E039 Hypothyroidism, unspecified: Secondary | ICD-10-CM

## 2016-09-14 DIAGNOSIS — K5900 Constipation, unspecified: Secondary | ICD-10-CM

## 2016-09-14 NOTE — Patient Instructions (Addendum)
You may want to try a stool softener and Miralax as discussed.   Call and check with your insurance regarding the Shingrix vaccine before you go to your pharmacy.   Polyethylene Glycol powder What is this medicine? POLYETHYLENE GLYCOL 3350 (pol ee ETH i leen; GLYE col) powder is a laxative used to treat constipation. It increases the amount of water in the stool. Bowel movements become easier and more frequent. This medicine may be used for other purposes; ask your health care provider or pharmacist if you have questions. COMMON BRAND NAME(S): Sharlyn Bologna, GlycoLax, MiraLax, Smooth LAX, [employer] What should I tell my health care provider before I take this medicine? They need to know if you have any of these conditions: -a history of blockage of the stomach or intestine -current abdomen distension or pain -difficulty swallowing -diverticulitis, ulcerative colitis, or other chronic bowel disease -phenylketonuria -an unusual or allergic reaction to polyethylene glycol, other medicines, dyes, or preservatives -pregnant or trying to get pregnant -breast-feeding How should I use this medicine? Take this medicine by mouth. The bottle has a measuring cap that is marked with a line. Pour the powder into the cap up to the marked line (the dose is about 1 heaping tablespoon). Add the powder in the cap to a full glass (4 to 8 ounces or 120 to 240 ml) of water, juice, soda, coffee or tea. Mix the powder well. Drink the solution. Take exactly as directed. Do not take your medicine more often than directed. Talk to your pediatrician regarding the use of this medicine in children. Special care may be needed. Overdosage: If you think you have taken too much of this medicine contact a poison control center or emergency room at once. NOTE: This medicine is only for you. Do not share this medicine with others. What if I miss a dose? If you miss a dose, take it as soon as you can. If it is almost time  for your next dose, take only that dose. Do not take double or extra doses. What may interact with this medicine? Interactions are not expected. This list may not describe all possible interactions. Give your health care provider a list of all the medicines, herbs, non-prescription drugs, or dietary supplements you use. Also tell them if you smoke, drink alcohol, or use illegal drugs. Some items may interact with your medicine. What should I watch for while using this medicine? Do not use for more than 2 weeks without advice from your doctor or health care professional. It can take 2 to 4 days to have a bowel movement and to experience improvement in constipation. See your health care professional for any changes in bowel habits, including constipation, that are severe or last longer than three weeks. Always take this medicine with plenty of water. What side effects may I notice from receiving this medicine? Side effects that you should report to your doctor or health care professional as soon as possible: -diarrhea -difficulty breathing -itching of the skin, hives, or skin rash -severe bloating, pain, or distension of the stomach -vomiting Side effects that usually do not require medical attention (report to your doctor or health care professional if they continue or are bothersome): -bloating or gas -lower abdominal discomfort or cramps -nausea This list may not describe all possible side effects. Call your doctor for medical advice about side effects. You may report side effects to FDA at 1-800-FDA-1088. Where should I keep my medicine? Keep out of the reach of children.  Store between 15 and 30 degrees C (59 and 86 degrees F). Throw away any unused medicine after the expiration date. NOTE: This sheet is a summary. It may not cover all possible information. If you have questions about this medicine, talk to your doctor, pharmacist, or health care provider.  2018 Elsevier/Gold Standard  (2007-11-25 16:50:45)

## 2016-09-14 NOTE — Progress Notes (Signed)
[name] is a 83 y.o. female who presents for annual wellness visit and follow-up on chronic medical conditions.  She has the following concerns: Occasional hard stools and straining to have bowel movements.  Reports an episode of dizziness in February and in one in March where her HR felt fast for her in the 90s and BP was 140s/100. States this lasted a few minutes and she felt better after resting and drinking sips of water. Denies having chest pain or shortness of breath. States she has an internal monitor and has an appointment with her cardiologist next month. States he is able to review her heart rhythm at her visits.  Reports good medication compliance with all her medications.    States she will see Dr Delman Cheadle in July for lichen sclerosis and does not want a pelvic exam today.    Immunization History  Administered Date(s) Administered  . Gamma Globulin 10/09/1983, 12/18/1984, 07/24/1989  . Hepatitis A 10/19/1995  . Hepatitis B 06/08/2009  . IPV 12/07/1969, 12/19/1983, 11/25/1984, 01/08/1986  . Influenza-Unspecified 01/19/2015  . OPV 10/19/1995  . Pneumococcal Conjugate-13 02/09/2015  . Pneumococcal Polysaccharide-23 07/06/2001  . Td 11/13/1978, 01/04/1989, 10/19/1995, 06/08/2009  . Tdap 08/26/2010  . Typhoid Inactivated 10/09/1983, 11/01/1989, 11/11/2002  . Typhoid Live 12/09/1995, 06/08/2009  . Yellow Fever 08/26/2010  . Zoster 09/12/2006   Had left breast removed because of intraductal carcinoma.  Last Pap smear:  Hysterectomy after menopause  Last mammogram: 2017 Last colonoscopy: not having one anymore (maybe within the last 2 years) Last DEXA: 2017 Dentist: Dr. Maretta Bees - off [address] Ophtho: Dr. Katy Fitch Exercise: yes,  6 times per week.  Goes to gym 2 days per week.   Other doctors caring for patient include: Cardiology- Dr. Lovena Le Skin- Dr. Jari Pigg Eye-Dr. Loletha Carrow- Dr. Posey Pronto Dentist- Dr. Luretha Rued a PT once a month to work on balance and core-  this is self prescribed.    Depression screen:  See questionnaire below.  Depression screen [facility] 2/9 09/14/2016 09/09/2015  Decreased Interest 0 0  Down, Depressed, Hopeless 0 0  PHQ - 2 Score 0 0    Fall Risk Screen: see questionnaire below. Fall Risk  09/14/2016 09/09/2015  Falls in the past year? No No    ADL screen:  See questionnaire below Functional Status Survey: Is the patient deaf or have difficulty hearing?: No Does the patient have difficulty seeing, even when wearing glasses/contacts?: No Does the patient have difficulty concentrating, remembering, or making decisions?: Yes (remembering) Does the patient have difficulty walking or climbing stairs?: Yes Does the patient have difficulty dressing or bathing?: No Does the patient have difficulty doing errands alone such as visiting a doctor's office or shopping?: No   End of Life Discussion:  Patient has a living will and medical power of attorney and this is on file. MOST form filled out in the office.   Review of Systems Constitutional: -fever, -chills, -sweats, -unexpected weight change, -anorexia, +fatigue- stable  Allergy: -sneezing, -itching, -congestion Dermatology: denies changing moles, rash, lumps, new worrisome lesions ENT: -runny nose, -ear pain, -sore throat, -hoarseness, -sinus pain, -teeth pain, -tinnitus, -hearing loss, -epistaxis Cardiology:  -chest pain, -palpitations, -edema, -orthopnea, -paroxysmal nocturnal dyspnea Respiratory: -cough, -shortness of breath, -dyspnea on exertion, -wheezing, -hemoptysis Gastroenterology: -abdominal pain, -nausea, -vomiting, -diarrhea, +constipation, -blood in stool, -changes in bowel movement, -dysphagia Hematology: -bleeding or bruising problems Musculoskeletal: -arthralgias, -myalgias, -joint swelling, + right low back pain, -neck pain, -cramping, -gait changes Ophthalmology: -vision changes, -eye redness, -  itching, -discharge Urology: -dysuria, -difficulty urinating,  -hematuria, -urinary frequency, -urgency, incontinence Neurology: +mild headache, -weakness, -tingling, -numbness, -speech abnormality, -memory loss, -falls, + intermittent dizziness Psychology:  -depressed mood, -agitation, -sleep problems    PHYSICAL EXAM:  BP 132/80   Pulse (!) 52   Resp 16   Ht 5\' 5"  (1.651 m)   Wt 123 lb 9.6 oz (56.1 kg)   BMI 20.57 kg/m   General Appearance: Alert, cooperative, no distress, appears stated age Head: Normocephalic, without obvious abnormality, atraumatic Eyes: PERRL, conjunctiva/corneas clear, EOM's intact, fundi benign Ears: Normal TM's and external ear canals Nose: Nares normal, mucosa normal, no drainage or sinus   tenderness Throat: Lips, mucosa, and tongue normal; teeth and gums normal Neck: Supple, no lymphadenopathy; thyroid: no enlargement/tenderness/nodules; no carotid bruit or JVD Back: Spine nontender, no curvature, ROM normal, no CVA tenderness Lungs: Clear to auscultation bilaterally without wheezes, rales or ronchi; respirations unlabored Chest Wall: No tenderness or deformity Heart: Regular rate and rhythm, S1 and S2 normal, no murmur, rub or gallop Breast Exam: declines.  No axillary lymphadenopathy Abdomen: Soft, non-tender, nondistended, normoactive bowel sounds, no masses, no hepatosplenomegaly Genitalia: declines  Extremities: No clubbing, cyanosis or edema Pulses: 2+ and symmetric all extremities Skin: Skin color, texture, turgor normal, no rashes or lesions Lymph nodes: Cervical, supraclavicular, and axillary nodes normal Neurologic: CNII-XII intact, normal strength, sensation and gait; reflexes 2+ and symmetric throughout Psych: Normal mood, affect, hygiene and grooming.  ASSESSMENT/PLAN: Medicare annual wellness visit, subsequent  Dyslipidemia  Hypothyroidism, unspecified type  Essential hypertension  Constipation, unspecified constipation type  She appears to be doing well overall. No recent falls. No  depression or anxiety.  Advance directive counseling done and documents on file.  Reviewed immunizations and prescription given for Shingrix. She will call her insurance company prior to getting this.  BP is close to goal range. She will continue keeping an eye on her BP at home.  Reviewed labs and she will continue taking statin and medications for HTN, hyperlipidemia and hypothyroidism.  She will try stool softeners or Miralax and titrate as needed to soften stools.  She will see her cardiologist and dermatologist next month.  Follow up in 6 months or sooner if needed.    Discussed monthly self breast exams and yearly mammograms; at least 30 minutes of aerobic activity at least 5 days/week and weight-bearing exercise 2x/week; proper sunscreen use reviewed; healthy diet, including goals of calcium and vitamin D intake and alcohol recommendations (less than or equal to 1 drink/day) reviewed; regular seatbelt use; changing batteries in smoke detectors.  Immunization recommendations discussed.  Colonoscopy recommendations reviewed   Medicare Attestation I have personally reviewed: The patient's medical and social history Their use of alcohol, tobacco or illicit drugs Their current medications and supplements The patient's functional ability including ADLs,fall risks, home safety risks, cognitive, and hearing and visual impairment Diet and physical activities Evidence for depression or mood disorders  The patient's weight, height, and BMI have been recorded in the chart.  I have made referrals, counseling, and provided education to the patient based on review of the above and I have provided the patient with a written personalized care plan for preventive services.     Harland Dingwall, NP-C   09/14/2016

## 2016-09-15 ENCOUNTER — Encounter: Payer: Self-pay | Admitting: Family Medicine

## 2016-09-26 ENCOUNTER — Telehealth: Payer: Self-pay

## 2016-09-26 NOTE — Telephone Encounter (Signed)
error 

## 2016-09-28 LAB — HM MAMMOGRAPHY

## 2016-09-28 NOTE — Progress Notes (Signed)
   Subjective:    Patient ID: [name], female    DOB: [birth year], 83 y.o.   MRN: [id number]  HPI    Review of Systems     Objective:   Physical Exam        Assessment & Plan:

## 2016-10-03 ENCOUNTER — Encounter: Payer: Self-pay | Admitting: Family Medicine

## 2016-10-16 ENCOUNTER — Ambulatory Visit (INDEPENDENT_AMBULATORY_CARE_PROVIDER_SITE_OTHER): Payer: Medicare Other | Admitting: Internal Medicine

## 2016-10-16 ENCOUNTER — Encounter: Payer: Self-pay | Admitting: Internal Medicine

## 2016-10-16 ENCOUNTER — Other Ambulatory Visit: Payer: Self-pay | Admitting: Internal Medicine

## 2016-10-16 VITALS — BP 130/64 | HR 50 | Ht 65.0 in | Wt 124.0 lb

## 2016-10-16 DIAGNOSIS — I480 Paroxysmal atrial fibrillation: Secondary | ICD-10-CM

## 2016-10-16 DIAGNOSIS — I10 Essential (primary) hypertension: Secondary | ICD-10-CM | POA: Diagnosis not present

## 2016-10-16 MED ORDER — AMLODIPINE BESYLATE 5 MG PO TABS: 5.0000 mg | ORAL_TABLET | Freq: Every morning | ORAL | 2 refills | Status: DC

## 2016-10-16 MED ORDER — ATORVASTATIN CALCIUM 10 MG PO TABS: 5.0000 mg | ORAL_TABLET | Freq: Every day | ORAL | 2 refills | Status: DC

## 2016-10-16 NOTE — Patient Instructions (Signed)
Medication Instructions:  Your physician recommends that you continue on your current medications as directed. Please refer to the Current Medication list given to you today.   Labwork: None Ordered   Testing/Procedures: None Ordered   Follow-Up: Your physician wants you to follow-up in: 6 months with Dr. Taylor. You will receive a reminder letter in the mail two months in advance. If you don't receive a letter, please call our office to schedule the follow-up appointment.    Any Other Special Instructions Will Be Listed Below (If Applicable).     If you need a refill on your cardiac medications before your next appointment, please call your pharmacy.   

## 2016-10-16 NOTE — Progress Notes (Signed)
HPI [name] returns today for followup. She is a pleasant 83 yo woman with a h/o palpitations, cryptogenic stroke and PAF who returns today for followup. She has had some dizzy spells and sensation of an irregular heart beat. She checks her pulse with her blood pressure cuff and it is noted to be slow at times in the 40's and at other times in the 100 range. She has taken careful notes of her vitals and found that her symptoms correlate with PAF as demonstrated on her ILR. She has tolerated systemic anti-coagulation.  Allergies  Allergen Reactions  . Epinephrine Other (See Comments)     Reaction:  Caused pt to scream uncontrollably.     Current Outpatient Prescriptions  Medication Sig Dispense Refill  . amLODipine (NORVASC) 5 MG tablet Take 1 tablet (5 mg total) by mouth every morning. 90 tablet 2  . atorvastatin (LIPITOR) 10 MG tablet Take 0.5 tablets (5 mg total) by mouth daily. 45 tablet 2  . Coenzyme Q10 10 MG capsule Take 10 mg by mouth daily.     Marland Kitchen ELIQUIS 2.5 MG TABS tablet TAKE 1 TABLET BY MOUTH TWO  TIMES DAILY 180 tablet 1  . levothyroxine (SYNTHROID, LEVOTHROID) 100 MCG tablet Take 1 tablet (100 mcg total) by mouth daily. 30 tablet 2  . lisinopril (PRINIVIL,ZESTRIL) 10 MG tablet Take 1 tablet (10 mg total) by mouth daily. 90 tablet 0  . methylcellulose (ARTIFICIAL TEARS) 1 % ophthalmic solution Place 1 drop into both eyes daily as needed (for dry eyes).     . Multiple Vitamins-Iron (MULTIVITAMINS WITH IRON) TABS tablet Take 1 tablet by mouth daily.     . Omega-3 Fatty Acids (FISH OIL) 1200 MG CAPS Take 1,200 mg by mouth 2 (two) times daily.     . Vitamin D, Cholecalciferol, 1000 UNITS TABS Take 1 tablet by mouth daily.      No current facility-administered medications for this visit.      Past Medical History:  Diagnosis Date  . Anemia    hx of with surgery   . Arthritis 07-27-11   osteoarthritis-hips/s/p bil. THA, arthritis right hand   . Blood transfusion  feb 2014   post surgery some time ago  . Cancer ([place]) 1989   Breast cancer -s/p lt. mastectomy, some squamous cell lesions  . Cold hands   . Complication of anesthesia    epinephrine - screams uncontrollably / pt does not want general anesthesia or narcotics  . Constipation   . Fuchs' corneal dystrophy    both eyes  . Hard of hearing    both ears mild loss  . Hypertension   . Hypothyroidism 3-21- 13   tx. levothyroxine  . Lichen sclerosus et atrophicus of the vulva    pt reported.   . Osteopenia   . Stroke [facility])    a. s/p MDT LINQ 04/2014    ROS:   All systems reviewed and negative except as noted in the HPI.   Past Surgical History:  Procedure Laterality Date  . ABDOMINAL HYSTERECTOMY     ovaries removed  . ANKLE SURGERY  2005   right ankle tendon repair  . basal cell removed from face     3-4 times  . BREAST SURGERY  07-1987   lt.breast cancer intraductal cancer, mastectomy  . CATARACT EXTRACTION, BILATERAL  few yrs ago   Bilateral  . HARDWARE REMOVAL  04/05/2012   Procedure: HARDWARE REMOVAL;  Surgeon: Gearlean Alf, MD;  Location: [facility];  Service: Orthopedics;  Laterality: Right;  Removal of Hardware Right Femur   . HARDWARE REMOVAL Left 06/30/2013   Procedure: LEFT HIP HARDWARE REMOVAL OF CABLES;  Surgeon: Gearlean Alf, MD;  Location: [facility];  Service: Orthopedics;  Laterality: Left;  . HARDWARE REMOVAL Right 08/21/2013   Procedure: RIGHT HIP HARDWARE REMOVAL;  Surgeon: Gearlean Alf, MD;  Location: [facility];  Service: Orthopedics;  Laterality: Right;  . JOINT REPLACEMENT  07-27-11   Bilateral: Right THA - 04/2000, Left THA 06/2001  . left hip replacement Left feb 2003  . LOOP RECORDER IMPLANT  04-08-2014   MDT LINQ implanted by Dr Lovena Le for cryptogenic stroke  . MASTECTOMY  1989   left - Pt states has no restrictions on use of L arm for BP or blood draw  . melanoma removed Left    x 1  . ORIF FEMUR FRACTURE  07/31/2011   Procedure: OPEN REDUCTION INTERNAL  FIXATION (ORIF) DISTAL FEMUR FRACTURE;  Surgeon: Gearlean Alf, MD;  Location: [facility];  Service: Orthopedics;  Laterality: Right;  right femur  (c-arm)   . SQUAMOUS CELL CARCINOMA EXCISION  07-27-11   x2 left shoulder  . TOTAL HIP REVISION  04/10/2012   Procedure: TOTAL HIP REVISION;  Surgeon: Gearlean Alf, MD;  Location: [facility];  Service: Orthopedics;  Laterality: Right;  . TOTAL HIP REVISION Left 06/27/2012   Procedure: LEFT TOTAL HIP REVISION;  Surgeon: Gearlean Alf, MD;  Location: [facility];  Service: Orthopedics;  Laterality: Left;     Family History  Problem Relation Age of Onset  . Hodgkin's lymphoma Mother   . Celiac disease Mother   . Cancer Mother   . Melanoma Father   . Cancer Father   . Cancer Brother   . Arthritis Sister   . Ulcerative colitis Sister   . Heart disease Paternal Grandmother   . Heart disease Paternal Grandfather      Social History   Social History  . Marital status: Single    Spouse name: N/A  . Number of children: 1  . Years of education: N/A   Occupational History  . Retired    Social History Main Topics  . Smoking status: Former Smoker    Packs/day: 1.00    Years: 10.00    Types: Cigarettes    Quit date: 05/09/1963  . Smokeless tobacco: Never Used  . Alcohol use 0.6 oz/week    1 Glasses of wine per week     Comment: very little  . Drug use: No  . Sexual activity: No   Other Topics Concern  . Not on file   Social History Narrative   Lives alone.     BP 130/64   Pulse (!) 50   Ht 5\' 5"  (1.651 m)   Wt 124 lb (56.2 kg)   SpO2 98%   BMI 20.63 kg/m   Physical Exam:  Well appearing elderly woman, NAD HEENT: Unremarkable Neck:  6 cm JVD, no thyromegally Lymphatics:  No adenopathy Back:  No CVA tenderness Lungs:  Clear with no wheezes HEART:  Regular brady rhythm, no murmurs, no rubs, no clicks Abd:  soft, positive bowel sounds, no organomegally, no rebound, no guarding Ext:  2 plus pulses, no edema, no cyanosis, no  clubbing Skin:  No rashes no nodules Neuro:  CN II through XII intact, motor grossly intact  EKG - sinus bradycardia at 53/min with PAC's  DEVICE  ILR interogated in the  office demonstrating PAF and sinus bradycardia  Assess/Plan: 1. PAF - she is symptomatic but not terribly. We discussed AA drug therapy in detail. For now she will continue her current meds. 2. Sinus node dysfunction - she has symptomatic sinus node dysfunction. I discussed PPM insertion. She will undergo watchful waiting as she does not think she is symptomatic enough at this point. 3. Coags - she appears to be tolerating her Eliquis 4. Stroke -she has minimal functional limitation  Mikle Bosworth.D.

## 2016-10-18 ENCOUNTER — Encounter: Payer: Self-pay | Admitting: Internal Medicine

## 2016-10-23 ENCOUNTER — Telehealth: Payer: Self-pay | Admitting: Family Medicine

## 2016-10-23 MED ORDER — LEVOTHYROXINE SODIUM 100 MCG PO TABS: 100.0000 ug | ORAL_TABLET | Freq: Every day | ORAL | 1 refills | Status: DC

## 2016-10-23 NOTE — Telephone Encounter (Signed)
done

## 2016-10-23 NOTE — Telephone Encounter (Signed)
Rcvd refill request from [facility] for Levothyroxine

## 2016-10-24 ENCOUNTER — Encounter: Payer: Self-pay | Admitting: Family Medicine

## 2016-10-27 ENCOUNTER — Other Ambulatory Visit: Payer: Self-pay | Admitting: Family Medicine

## 2016-10-27 DIAGNOSIS — I480 Paroxysmal atrial fibrillation: Secondary | ICD-10-CM

## 2016-10-27 DIAGNOSIS — K5900 Constipation, unspecified: Secondary | ICD-10-CM

## 2016-10-27 DIAGNOSIS — I495 Sick sinus syndrome: Secondary | ICD-10-CM

## 2016-11-14 LAB — CUP PACEART INCLINIC DEVICE CHECK
Date Time Interrogation Session: 20180611210507
Implantable Pulse Generator Implant Date: 20151202

## 2016-11-24 ENCOUNTER — Encounter: Payer: Self-pay | Admitting: Internal Medicine

## 2016-12-21 ENCOUNTER — Encounter: Payer: Self-pay | Admitting: Family Medicine

## 2017-03-10 ENCOUNTER — Other Ambulatory Visit: Payer: Self-pay | Admitting: Family Medicine

## 2017-03-13 ENCOUNTER — Other Ambulatory Visit: Payer: Self-pay | Admitting: Internal Medicine

## 2017-03-13 DIAGNOSIS — I480 Paroxysmal atrial fibrillation: Secondary | ICD-10-CM

## 2017-03-14 NOTE — Telephone Encounter (Signed)
Pt last saw Dr Lovena Le 10/16/16, last labs 09/12/16 Creat 0.76, age 83, weight 56.2kg, based on specified criteria pt is on appropriate dosage of Eliquis 2.5mg  BID.  Will refill rx.

## 2017-03-20 ENCOUNTER — Encounter: Payer: Self-pay | Admitting: Family Medicine

## 2017-03-20 ENCOUNTER — Ambulatory Visit: Payer: Medicare Other | Admitting: Family Medicine

## 2017-03-20 VITALS — BP 124/80 | HR 50 | Wt 124.8 lb

## 2017-03-20 DIAGNOSIS — I10 Essential (primary) hypertension: Secondary | ICD-10-CM

## 2017-03-20 DIAGNOSIS — E785 Hyperlipidemia, unspecified: Secondary | ICD-10-CM | POA: Diagnosis not present

## 2017-03-20 DIAGNOSIS — E039 Hypothyroidism, unspecified: Secondary | ICD-10-CM | POA: Diagnosis not present

## 2017-03-20 DIAGNOSIS — I480 Paroxysmal atrial fibrillation: Secondary | ICD-10-CM | POA: Diagnosis not present

## 2017-03-20 DIAGNOSIS — Z8719 Personal history of other diseases of the digestive system: Secondary | ICD-10-CM

## 2017-03-20 LAB — HEMOCCULT GUIAC POC 1CARD (OFFICE): FECAL OCCULT BLD: NEGATIVE

## 2017-03-20 NOTE — Progress Notes (Signed)
   Subjective:    Patient ID: [name], female    DOB: 07/13/1931, 83 y.o.   MRN: [id number]  HPI Chief Complaint  Patient presents with  . 6 month follow-up    6 month follow-up, concerns about hemorrhoids   She is here for a 6 month follow up on chronic health conditions.   Reports good compliance with medications including levothyroxine.   States her heart rate has been dipping down in the 30s at night and she was told she needs a pacemaker. She is not particularly in favor of this. This is not new and her cardiologist is aware.  Complains of fatigue but denies chest pain, palpitations, shortness of breath.   States she tried Miralax for intermittent constipation but did not like the taste or texture.  States she occasionally has hard stools has notices a scant amount of BRB on the toilet tissue. Reports history of hemorrhoids in the past. States she has hemorrhoid cream at home. Denies blood in stool or changes in bowel habits. No rectal pain or itching.  States she feels like her anus is tighter than in the past due to differences in passing gas.    Denies fever, chills, dizziness, numbness, tingling, weakness, cough, shortness of breath, abdominal pain, N/V/D, urinary symptoms, LE edema.   Reviewed allergies, medications, past medical, surgical, and social history.   Review of Systems Pertinent positives and negatives in the history of present illness.     Objective:   Physical Exam  Constitutional: She is oriented to person, place, and time. She appears well-developed and well-nourished. No distress.  HENT:  Mouth/Throat: Oropharynx is clear and moist.  Eyes: Conjunctivae are normal. Pupils are equal, round, and reactive to light.  Neck: Normal range of motion. Neck supple.  Cardiovascular: Normal rate and regular rhythm.  Pulmonary/Chest: Effort normal and breath sounds normal.  Abdominal: Soft. She exhibits no distension. There is no tenderness.  Genitourinary:  Rectum normal. Rectal exam shows guaiac negative stool.  Neurological: She is alert and oriented to person, place, and time. No cranial nerve deficit. Gait normal.  Skin: Skin is warm and dry. No rash noted. No pallor.  Psychiatric: She has a normal mood and affect. Her speech is normal and behavior is normal. Thought content normal.   BP 124/80   Pulse (!) 50   Wt 124 lb 12.8 oz (56.6 kg)   BMI 20.77 kg/m       Assessment & Plan:  Essential hypertension - Plan: Basic metabolic panel  Hypothyroidism, unspecified type - Plan: TSH  Paroxysmal atrial fibrillation (HCC)  Dyslipidemia  History of hemorrhoids - Plan: POCT occult blood stool  BP at goal. Continue on current medication.  Will recheck TSH and adjust levothyroxine as appropriate.  Rectal exam unremarkable and hemoccult negative.  Counseled on keeping stools soft.  Called and appointment obtained with Dr. Lovena Le, cardiologist, for tomorrow. Patient declines appointment and states has a prior commitment. She will call and reschedule with her cardiologist.  Follow up pending labs.

## 2017-03-21 ENCOUNTER — Ambulatory Visit: Payer: Medicare Other | Admitting: Internal Medicine

## 2017-03-21 LAB — BASIC METABOLIC PANEL
BUN: 15 mg/dL (ref 7–25)
CALCIUM: 9.5 mg/dL (ref 8.6–10.4)
CHLORIDE: 100 mmol/L (ref 98–110)
CO2: 29 mmol/L (ref 20–32)
Creat: 0.65 mg/dL (ref 0.60–0.88)
GLUCOSE: 80 mg/dL (ref 65–99)
Potassium: 5.2 mmol/L (ref 3.5–5.3)
Sodium: 134 mmol/L — ABNORMAL LOW (ref 135–146)

## 2017-03-21 LAB — TSH: TSH: 2.19 mIU/L (ref 0.40–4.50)

## 2017-04-11 ENCOUNTER — Encounter: Payer: Self-pay | Admitting: Internal Medicine

## 2017-04-11 ENCOUNTER — Ambulatory Visit: Payer: Medicare Other | Admitting: Internal Medicine

## 2017-04-11 VITALS — BP 150/70 | HR 54 | Ht 65.0 in | Wt 128.2 lb

## 2017-04-11 DIAGNOSIS — I495 Sick sinus syndrome: Secondary | ICD-10-CM | POA: Diagnosis not present

## 2017-04-11 DIAGNOSIS — I639 Cerebral infarction, unspecified: Secondary | ICD-10-CM | POA: Diagnosis not present

## 2017-04-11 DIAGNOSIS — I480 Paroxysmal atrial fibrillation: Secondary | ICD-10-CM | POA: Diagnosis not present

## 2017-04-11 NOTE — H&P (View-Only) (Signed)
HPI [name] today for ongoing follow-up of her implantable loop recorder.  She has a history of syncope and cryptogenic stroke, and has had documented atrial fibrillation.  She is also had bradycardia but is minimally if at all symptomatic.  Most of her pauses occur at nighttime when she is sleeping.  Since her loop recorder was inserted, she has not had frank syncope.  Reluctantly, she takes systemic anticoagulation thromboembolic prevention. Allergies  Allergen Reactions  . Epinephrine Other (See Comments)     Reaction:  Caused pt to scream uncontrollably.     Current Outpatient Medications  Medication Sig Dispense Refill  . amLODipine (NORVASC) 5 MG tablet Take 1 tablet (5 mg total) by mouth every morning. 90 tablet 2  . atorvastatin (LIPITOR) 10 MG tablet Take 0.5 tablets (5 mg total) by mouth daily. 45 tablet 2  . Coenzyme Q10 10 MG capsule Take 10 mg by mouth daily.     Marland Kitchen ELIQUIS 2.5 MG TABS tablet TAKE 1 TABLET BY MOUTH TWO  TIMES DAILY 180 tablet 1  . levothyroxine (SYNTHROID, LEVOTHROID) 100 MCG tablet TAKE 1 TABLET BY MOUTH  DAILY 90 tablet 1  . lisinopril (PRINIVIL,ZESTRIL) 10 MG tablet TAKE 1 TABLET BY MOUTH  DAILY 90 tablet 3  . methylcellulose (ARTIFICIAL TEARS) 1 % ophthalmic solution Place 1 drop into both eyes daily as needed (for dry eyes).     . Multiple Vitamins-Iron (MULTIVITAMINS WITH IRON) TABS tablet Take 1 tablet by mouth daily.     . Omega-3 Fatty Acids (FISH OIL) 1200 MG CAPS Take 1,200 mg by mouth 2 (two) times daily.     . Vitamin D, Cholecalciferol, 1000 UNITS TABS Take 1 tablet by mouth daily.      No current facility-administered medications for this visit.      Past Medical History:  Diagnosis Date  . Anemia    hx of with surgery   . Arthritis 07-27-11   osteoarthritis-hips/s/p bil. THA, arthritis right hand   . Blood transfusion feb 2014   post surgery some time ago  . Cancer ([place]) 1989   Breast cancer -s/p lt. mastectomy, some  squamous cell lesions  . Cold hands   . Complication of anesthesia    epinephrine - screams uncontrollably / pt does not want general anesthesia or narcotics  . Constipation   . Fuchs' corneal dystrophy    both eyes  . Hard of hearing    both ears mild loss  . Hypertension   . Hypothyroidism 3-21- 13   tx. levothyroxine  . Lichen sclerosus et atrophicus of the vulva    pt reported.   . Osteopenia   . Stroke [facility])    a. s/p MDT LINQ 04/2014    ROS:   All systems reviewed and negative except as noted in the HPI.   Past Surgical History:  Procedure Laterality Date  . ABDOMINAL HYSTERECTOMY     ovaries removed  . ANKLE SURGERY  2005   right ankle tendon repair  . basal cell removed from face     3-4 times  . BREAST SURGERY  07-1987   lt.breast cancer intraductal cancer, mastectomy  . CATARACT EXTRACTION, BILATERAL  few yrs ago   Bilateral  . HARDWARE REMOVAL  04/05/2012   Procedure: HARDWARE REMOVAL;  Surgeon: Gearlean Alf, MD;  Location: [facility];  Service: Orthopedics;  Laterality: Right;  Removal of Hardware Right Femur   . HARDWARE REMOVAL Left 06/30/2013   Procedure:  LEFT HIP HARDWARE REMOVAL OF CABLES;  Surgeon: Gearlean Alf, MD;  Location: [facility];  Service: Orthopedics;  Laterality: Left;  . HARDWARE REMOVAL Right 08/21/2013   Procedure: RIGHT HIP HARDWARE REMOVAL;  Surgeon: Gearlean Alf, MD;  Location: [facility];  Service: Orthopedics;  Laterality: Right;  . JOINT REPLACEMENT  07-27-11   Bilateral: Right THA - 04/2000, Left THA 06/2001  . left hip replacement Left feb 2003  . LOOP RECORDER IMPLANT  04-08-2014   MDT LINQ implanted by Dr Lovena Le for cryptogenic stroke  . MASTECTOMY  1989   left - Pt states has no restrictions on use of L arm for BP or blood draw  . melanoma removed Left    x 1  . ORIF FEMUR FRACTURE  07/31/2011   Procedure: OPEN REDUCTION INTERNAL FIXATION (ORIF) DISTAL FEMUR FRACTURE;  Surgeon: Gearlean Alf, MD;  Location: [facility];  Service:  Orthopedics;  Laterality: Right;  right femur  (c-arm)   . SQUAMOUS CELL CARCINOMA EXCISION  07-27-11   x2 left shoulder  . TOTAL HIP REVISION  04/10/2012   Procedure: TOTAL HIP REVISION;  Surgeon: Gearlean Alf, MD;  [facility];  Service: Orthopedics;  Laterality: Right;  . TOTAL HIP REVISION Left 06/27/2012   Procedure: LEFT TOTAL HIP REVISION;  Surgeon: Gearlean Alf, MD;  Location: [facility];  Service: Orthopedics;  Laterality: Left;     Family History  Problem Relation Age of Onset  . Hodgkin's lymphoma Mother   . Celiac disease Mother   . Cancer Mother   . Melanoma Father   . Cancer Father   . Cancer Brother   . Arthritis Sister   . Ulcerative colitis Sister   . Heart disease Paternal Grandmother   . Heart disease Paternal Grandfather      Social History   Socioeconomic History  . Marital status: Single    Spouse name: Not on file  . Number of children: 1  . Years of education: Not on file  . Highest education level: Not on file  Social Needs  . Financial resource strain: Not on file  . Food insecurity - worry: Not on file  . Food insecurity - inability: Not on file  . Transportation needs - medical: Not on file  . Transportation needs - non-medical: Not on file  Occupational History  . Occupation: Retired  Tobacco Use  . Smoking status: Former Smoker    Packs/day: 1.00    Years: 10.00    Pack years: 10.00    Types: Cigarettes    Last attempt to quit: 05/09/1963    Years since quitting: 53.9  . Smokeless tobacco: Never Used  Substance and Sexual Activity  . Alcohol use: Yes    Alcohol/week: 0.6 oz    Types: 1 Glasses of wine per week    Comment: very little  . Drug use: No  . Sexual activity: No  Other Topics Concern  . Not on file  Social History Narrative   Lives alone.     BP (!) 150/70   Pulse (!) 54   Ht 5\' 5"  (1.651 m)   Wt 128 lb 3.2 oz (58.2 kg)   SpO2 98%   BMI 21.33 kg/m   Physical Exam:  Well appearing 83 year old woman,  NAD HEENT: Unremarkable Neck:   6 cm JVD, no thyromegally Lymphatics:  No adenopathy Back:  No CVA tenderness Lungs:  Clear, with no wheezes, rales, or rhonchi hEART:  Regular rate rhythm, no  murmurs, no rubs, no clicks Abd:  soft, positive bowel sounds, no organomegally, no rebound, no guarding Ext:  2 plus pulses, no edema, no cyanosis, no clubbing Skin:  No rashes no nodules Neuro:  CN II through XII intact, motor grossly intact  DEVICE  Normal device function.  See PaceArt for details.   Assess/Plan:  1.  Paroxysmal atrial fibrillation  -She is mostly in sinus rhythm.  She will undergo watchful waiting and continue systemic anticoagulation. 2.  Sinus node dysfunction -she does have some symptoms but is currently not interested in proceeding with insertion of a pacemaker.  Most of her bradycardia is nocturnal. 3.  Hypertension -her blood pressure is elevated.  She is not inclined to consider additional antihypertensive meds.  I strongly encourage the patient to maintain a low-sodium diet and exercise. 4.  Syncope -we discussed the possible etiologies.  Since her loop recorder was inserted, she has not had frank syncope that we could associate with bradycardia or tachycardia.  She will undergo watchful waiting.  Crissie Sickles, MD

## 2017-04-11 NOTE — Patient Instructions (Signed)
Medication Instructions:  Your physician recommends that you continue on your current medications as directed. Please refer to the Current Medication list given to you today.  Labwork: None ordered.  Testing/Procedures: You will have your loop recorder removed.  Follow-Up: You will follow up with device clinic 10-14 days after your procedure for a wound check.  Any Other Special Instructions Will Be Listed Below (If Applicable).  Please arrive at admitting at 12:30 pm on April 26, 2017.  There are no special instructions, you may eat and take your medications as normal.   If you need a refill on your cardiac medications before your next appointment, please call your pharmacy.

## 2017-04-11 NOTE — Progress Notes (Signed)
HPI [name] today for ongoing follow-up of her implantable loop recorder.  She has a history of syncope and cryptogenic stroke, and has had documented atrial fibrillation.  She is also had bradycardia but is minimally if at all symptomatic.  Most of her pauses occur at nighttime when she is sleeping.  Since her loop recorder was inserted, she has not had frank syncope.  Reluctantly, she takes systemic anticoagulation thromboembolic prevention. Allergies  Allergen Reactions  . Epinephrine Other (See Comments)     Reaction:  Caused pt to scream uncontrollably.     Current Outpatient Medications  Medication Sig Dispense Refill  . amLODipine (NORVASC) 5 MG tablet Take 1 tablet (5 mg total) by mouth every morning. 90 tablet 2  . atorvastatin (LIPITOR) 10 MG tablet Take 0.5 tablets (5 mg total) by mouth daily. 45 tablet 2  . Coenzyme Q10 10 MG capsule Take 10 mg by mouth daily.     Marland Kitchen ELIQUIS 2.5 MG TABS tablet TAKE 1 TABLET BY MOUTH TWO  TIMES DAILY 180 tablet 1  . levothyroxine (SYNTHROID, LEVOTHROID) 100 MCG tablet TAKE 1 TABLET BY MOUTH  DAILY 90 tablet 1  . lisinopril (PRINIVIL,ZESTRIL) 10 MG tablet TAKE 1 TABLET BY MOUTH  DAILY 90 tablet 3  . methylcellulose (ARTIFICIAL TEARS) 1 % ophthalmic solution Place 1 drop into both eyes daily as needed (for dry eyes).     . Multiple Vitamins-Iron (MULTIVITAMINS WITH IRON) TABS tablet Take 1 tablet by mouth daily.     . Omega-3 Fatty Acids (FISH OIL) 1200 MG CAPS Take 1,200 mg by mouth 2 (two) times daily.     . Vitamin D, Cholecalciferol, 1000 UNITS TABS Take 1 tablet by mouth daily.      No current facility-administered medications for this visit.      Past Medical History:  Diagnosis Date  . Anemia    hx of with surgery   . Arthritis 07-27-11   osteoarthritis-hips/s/p bil. THA, arthritis right hand   . Blood transfusion feb 2014   post surgery some time ago  . Cancer ([place]) 1989   Breast cancer -s/p lt. mastectomy, some  squamous cell lesions  . Cold hands   . Complication of anesthesia    epinephrine - screams uncontrollably / pt does not want general anesthesia or narcotics  . Constipation   . Fuchs' corneal dystrophy    both eyes  . Hard of hearing    both ears mild loss  . Hypertension   . Hypothyroidism 3-21- 13   tx. levothyroxine  . Lichen sclerosus et atrophicus of the vulva    pt reported.   . Osteopenia   . Stroke [facility])    a. s/p MDT LINQ 04/2014    ROS:   All systems reviewed and negative except as noted in the HPI.   Past Surgical History:  Procedure Laterality Date  . ABDOMINAL HYSTERECTOMY     ovaries removed  . ANKLE SURGERY  2005   right ankle tendon repair  . basal cell removed from face     3-4 times  . BREAST SURGERY  07-1987   lt.breast cancer intraductal cancer, mastectomy  . CATARACT EXTRACTION, BILATERAL  few yrs ago   Bilateral  . HARDWARE REMOVAL  04/05/2012   Procedure: HARDWARE REMOVAL;  Surgeon: Gearlean Alf, MD;  [facility];  Service: Orthopedics;  Laterality: Right;  Removal of Hardware Right Femur   . HARDWARE REMOVAL Left 06/30/2013   Procedure:  LEFT HIP HARDWARE REMOVAL OF CABLES;  Surgeon: Gearlean Alf, MD;  [facility];  Service: Orthopedics;  Laterality: Left;  . HARDWARE REMOVAL Right 08/21/2013   Procedure: RIGHT HIP HARDWARE REMOVAL;  Surgeon: Gearlean Alf, MD;  [facility];  Service: Orthopedics;  Laterality: Right;  . JOINT REPLACEMENT  07-27-11   Bilateral: Right THA - 04/2000, Left THA 06/2001  . left hip replacement Left feb 2003  . LOOP RECORDER IMPLANT  04-08-2014   MDT LINQ implanted by Dr Lovena Le for cryptogenic stroke  . MASTECTOMY  1989   left - Pt states has no restrictions on use of L arm for BP or blood draw  . melanoma removed Left    x 1  . ORIF FEMUR FRACTURE  07/31/2011   Procedure: OPEN REDUCTION INTERNAL FIXATION (ORIF) DISTAL FEMUR FRACTURE;  Surgeon: Gearlean Alf, MD;  [facility];  Service:  Orthopedics;  Laterality: Right;  right femur  (c-arm)   . SQUAMOUS CELL CARCINOMA EXCISION  07-27-11   x2 left shoulder  . TOTAL HIP REVISION  04/10/2012   Procedure: TOTAL HIP REVISION;  Surgeon: Gearlean Alf, MD;  [facility];  Service: Orthopedics;  Laterality: Right;  . TOTAL HIP REVISION Left 06/27/2012   Procedure: LEFT TOTAL HIP REVISION;  Surgeon: Gearlean Alf, MD;  [facility];  Service: Orthopedics;  Laterality: Left;     Family History  Problem Relation Age of Onset  . Hodgkin's lymphoma Mother   . Celiac disease Mother   . Cancer Mother   . Melanoma Father   . Cancer Father   . Cancer Brother   . Arthritis Sister   . Ulcerative colitis Sister   . Heart disease Paternal Grandmother   . Heart disease Paternal Grandfather      Social History   Socioeconomic History  . Marital status: Single    Spouse name: Not on file  . Number of children: 1  . Years of education: Not on file  . Highest education level: Not on file  Social Needs  . Financial resource strain: Not on file  . Food insecurity - worry: Not on file  . Food insecurity - inability: Not on file  . Transportation needs - medical: Not on file  . Transportation needs - non-medical: Not on file  Occupational History  . Occupation: Retired  Tobacco Use  . Smoking status: Former Smoker    Packs/day: 1.00    Years: 10.00    Pack years: 10.00    Types: Cigarettes    Last attempt to quit: 05/09/1963    Years since quitting: 53.9  . Smokeless tobacco: Never Used  Substance and Sexual Activity  . Alcohol use: Yes    Alcohol/week: 0.6 oz    Types: 1 Glasses of wine per week    Comment: very little  . Drug use: No  . Sexual activity: No  Other Topics Concern  . Not on file  Social History Narrative   Lives alone.     BP (!) 150/70   Pulse (!) 54   Ht 5\' 5"  (1.651 m)   Wt 128 lb 3.2 oz (58.2 kg)   SpO2 98%   BMI 21.33 kg/m   Physical Exam:  Well appearing 83 year old woman,  NAD HEENT: Unremarkable Neck:   6 cm JVD, no thyromegally Lymphatics:  No adenopathy Back:  No CVA tenderness Lungs:  Clear, with no wheezes, rales, or rhonchi hEART:  Regular rate rhythm, no  murmurs, no rubs, no clicks Abd:  soft, positive bowel sounds, no organomegally, no rebound, no guarding Ext:  2 plus pulses, no edema, no cyanosis, no clubbing Skin:  No rashes no nodules Neuro:  CN II through XII intact, motor grossly intact  DEVICE  Normal device function.  See PaceArt for details.   Assess/Plan:  1.  Paroxysmal atrial fibrillation  -She is mostly in sinus rhythm.  She will undergo watchful waiting and continue systemic anticoagulation. 2.  Sinus node dysfunction -she does have some symptoms but is currently not interested in proceeding with insertion of a pacemaker.  Most of her bradycardia is nocturnal. 3.  Hypertension -her blood pressure is elevated.  She is not inclined to consider additional antihypertensive meds.  I strongly encourage the patient to maintain a low-sodium diet and exercise. 4.  Syncope -we discussed the possible etiologies.  Since her loop recorder was inserted, she has not had frank syncope that we could associate with bradycardia or tachycardia.  She will undergo watchful waiting.  Crissie Sickles, MD

## 2017-04-12 ENCOUNTER — Encounter: Payer: Self-pay | Admitting: Internal Medicine

## 2017-04-18 LAB — CUP PACEART INCLINIC DEVICE CHECK
Implantable Pulse Generator Implant Date: 20151202
MDC IDC SESS DTM: 20181205152928

## 2017-04-24 ENCOUNTER — Encounter: Payer: Self-pay | Admitting: Internal Medicine

## 2017-04-26 ENCOUNTER — Encounter (HOSPITAL_COMMUNITY)
Admission: RE | Disposition: A | Discharge status: Home / Self Care | Payer: Self-pay | Source: Ambulatory Visit | Attending: Internal Medicine

## 2017-04-26 ENCOUNTER — Encounter (HOSPITAL_COMMUNITY): Payer: Self-pay | Admitting: Internal Medicine

## 2017-04-26 ENCOUNTER — Ambulatory Visit (HOSPITAL_COMMUNITY)
Admission: RE | Admit: 2017-04-26 | Discharge: 2017-04-26 | Disposition: A | Discharge status: Home / Self Care | Payer: Medicare Other | Source: Ambulatory Visit | Attending: Internal Medicine | Admitting: Internal Medicine

## 2017-04-26 DIAGNOSIS — Z853 Personal history of malignant neoplasm of breast: Secondary | ICD-10-CM | POA: Insufficient documentation

## 2017-04-26 DIAGNOSIS — I480 Paroxysmal atrial fibrillation: Secondary | ICD-10-CM | POA: Diagnosis not present

## 2017-04-26 DIAGNOSIS — Z87891 Personal history of nicotine dependence: Secondary | ICD-10-CM | POA: Diagnosis not present

## 2017-04-26 DIAGNOSIS — H9193 Unspecified hearing loss, bilateral: Secondary | ICD-10-CM | POA: Diagnosis not present

## 2017-04-26 DIAGNOSIS — Z79899 Other long term (current) drug therapy: Secondary | ICD-10-CM | POA: Diagnosis not present

## 2017-04-26 DIAGNOSIS — R001 Bradycardia, unspecified: Secondary | ICD-10-CM | POA: Diagnosis not present

## 2017-04-26 DIAGNOSIS — Z9012 Acquired absence of left breast and nipple: Secondary | ICD-10-CM | POA: Insufficient documentation

## 2017-04-26 DIAGNOSIS — R55 Syncope and collapse: Secondary | ICD-10-CM | POA: Diagnosis not present

## 2017-04-26 DIAGNOSIS — Z8673 Personal history of transient ischemic attack (TIA), and cerebral infarction without residual deficits: Secondary | ICD-10-CM | POA: Diagnosis not present

## 2017-04-26 DIAGNOSIS — Z4509 Encounter for adjustment and management of other cardiac device: Secondary | ICD-10-CM | POA: Insufficient documentation

## 2017-04-26 DIAGNOSIS — I10 Essential (primary) hypertension: Secondary | ICD-10-CM | POA: Insufficient documentation

## 2017-04-26 DIAGNOSIS — Z7901 Long term (current) use of anticoagulants: Secondary | ICD-10-CM | POA: Insufficient documentation

## 2017-04-26 DIAGNOSIS — Z96643 Presence of artificial hip joint, bilateral: Secondary | ICD-10-CM | POA: Diagnosis not present

## 2017-04-26 DIAGNOSIS — I495 Sick sinus syndrome: Secondary | ICD-10-CM | POA: Insufficient documentation

## 2017-04-26 DIAGNOSIS — I6389 Other cerebral infarction: Secondary | ICD-10-CM | POA: Diagnosis not present

## 2017-04-26 DIAGNOSIS — I639 Cerebral infarction, unspecified: Secondary | ICD-10-CM | POA: Diagnosis present

## 2017-04-26 HISTORY — PX: LOOP RECORDER REMOVAL: EP1215

## 2017-04-26 SURGERY — LOOP RECORDER REMOVAL

## 2017-04-26 MED ORDER — LIDOCAINE HCL (PF) 1 % IJ SOLN
INTRAMUSCULAR | Status: AC
  Filled 2017-04-26: qty 30

## 2017-04-26 MED ORDER — LIDOCAINE HCL (PF) 1 % IJ SOLN
INTRAMUSCULAR | Status: DC | PRN
  Administered 2017-04-26: 8 mL

## 2017-04-26 SURGICAL SUPPLY — 1 items: PACK LOOP INSERTION (CUSTOM PROCEDURE TRAY) ×3 IMPLANT

## 2017-04-26 NOTE — Interval H&P Note (Signed)
History and Physical Interval Note:  04/26/2017 12:36 PM  [name]  has presented today for surgery, with the diagnosis of eri  The various methods of treatment have been discussed with the patient and family. After consideration of risks, benefits and other options for treatment, the patient has consented to  Procedure(s): LOOP RECORDER REMOVAL (N/A) as a surgical intervention .  The patient's history has been reviewed, patient examined, no change in status, stable for surgery.  I have reviewed the patient's chart and labs.  Questions were answered to the patient's satisfaction.     Cristopher Peru

## 2017-05-08 ENCOUNTER — Encounter: Payer: Self-pay | Admitting: Family Medicine

## 2017-05-10 ENCOUNTER — Telehealth: Payer: Self-pay | Admitting: Internal Medicine

## 2017-05-10 ENCOUNTER — Ambulatory Visit (INDEPENDENT_AMBULATORY_CARE_PROVIDER_SITE_OTHER): Payer: Self-pay | Admitting: *Deleted

## 2017-05-10 DIAGNOSIS — I480 Paroxysmal atrial fibrillation: Secondary | ICD-10-CM

## 2017-05-10 NOTE — Telephone Encounter (Signed)
Ok, however, please inform patient that 1st visit will likely need to be with an APP based on availability of office visits

## 2017-05-10 NOTE — Telephone Encounter (Signed)
See comments from Dr. Hilarie Fredrickson.

## 2017-05-10 NOTE — Progress Notes (Signed)
Patient presents to the office for a wound check s/p ILR explant on 12/20. Post procedure dressing/steri strips were removed prior to appt. Scab present at incision. Small amount of fluid accumulation noted underneath the incision. (See picture below, obtained with patient's permission). Tommye Standard, PA examined wound and explained the process of reabsorption to the patient and assured her that it was likely a collection of blood that would resolve within the next few weeks. Tommye Standard, PA offered patient a cxr to reassure her that there was not a foreign object under her skin. Patient declined the cxr d/t costs. Tommye Standard, PA recommended that patient take Keflex as a precaution, but patient declined, stating that since the site was "not red or hot to touch" she didn't believe it was infected and therefore did not see the point in taking an antibiotic. Patient was agreeable to f/u with the device clinic in 1 week for a wound recheck. Patient was instructed to call if she develops any fever/chills, or if the site becomes red, hot, or begins to drain. Patient verbalized understanding of instructions. Appt scheduled for 1/10 @ 1530.       Image obtained with patient's permission.

## 2017-05-10 NOTE — Telephone Encounter (Signed)
Left message for patient to call back and schedule an appointment.

## 2017-05-17 ENCOUNTER — Ambulatory Visit (INDEPENDENT_AMBULATORY_CARE_PROVIDER_SITE_OTHER): Payer: Self-pay | Admitting: *Deleted

## 2017-05-17 DIAGNOSIS — I480 Paroxysmal atrial fibrillation: Secondary | ICD-10-CM

## 2017-05-18 NOTE — Progress Notes (Signed)
LINQ explant wound re-check, scabbed over fluid filled nodule noted. Site assessed by SK and drained sanguineous fluid, incision site packed iodoform by SK and bandaid applied. Pt to call if fever or chills develop, also educated pt to apply pressure with gauze for 22min if bleeding starts. Pt voiced understanding. ROV 1/17 for wound re-check.

## 2017-05-24 ENCOUNTER — Ambulatory Visit (INDEPENDENT_AMBULATORY_CARE_PROVIDER_SITE_OTHER): Payer: Self-pay | Admitting: *Deleted

## 2017-05-24 DIAGNOSIS — I639 Cerebral infarction, unspecified: Secondary | ICD-10-CM

## 2017-05-24 NOTE — Progress Notes (Signed)
LINQ explant wound re-check. Dressing were removed prior to appointment by patient. Scabbed over fluid filled nodule noted. Site assessed by [facility], recommends no further intervention at this time and explained the process of reabsorption to the patient and that the collection of blood would resolve on its own. Recommended to follow-up as needed. WC educates patient on signs and symptoms of active bleeding and/or infection. Patient instructed to call the office if she develops pain at the site, fever/chills, site becomes red, hot, or begins to drain. Patient verbalized understanding.

## 2017-05-30 ENCOUNTER — Other Ambulatory Visit: Payer: Self-pay | Admitting: Internal Medicine

## 2017-05-31 ENCOUNTER — Telehealth: Payer: Self-pay | Admitting: Family Medicine

## 2017-05-31 NOTE — Telephone Encounter (Signed)
Dr Wynonia Lawman office called and wanted to let you know that they tried to call her back in the summer of last year 2018 to set up a appt for a second opion about her pace maker but could not get in touch with her, she was doing clean up work and seen this and wanted to make sure we was aware of this her name was [name] and she can be reached at [phone]

## 2017-06-21 ENCOUNTER — Ambulatory Visit (INDEPENDENT_AMBULATORY_CARE_PROVIDER_SITE_OTHER): Payer: Medicare Other | Admitting: Internal Medicine

## 2017-06-21 ENCOUNTER — Encounter: Payer: Self-pay | Admitting: Internal Medicine

## 2017-06-21 VITALS — BP 122/74 | HR 68 | Ht 65.0 in | Wt 127.0 lb

## 2017-06-21 DIAGNOSIS — M6289 Other specified disorders of muscle: Secondary | ICD-10-CM | POA: Diagnosis not present

## 2017-06-21 DIAGNOSIS — R1310 Dysphagia, unspecified: Secondary | ICD-10-CM

## 2017-06-21 DIAGNOSIS — R194 Change in bowel habit: Secondary | ICD-10-CM | POA: Diagnosis not present

## 2017-06-21 NOTE — Patient Instructions (Signed)
Try a daily powder fiber such as benefiber 1 tablespoon daily.   We have given you samples of the following medication to take: Align probiotic once daily   You have been scheduled for an Upper GI Series at [facility]. Your appointment is on 07/05/17 at 9:15 am. Please arrive 15 minutes prior to your test for registration. Make sure not to eat or drink anything after midnight on the night before your test. If you need to reschedule, please call radiology at [phone]. ________________________________________________________________ An upper GI series uses x rays to help diagnose problems of the upper GI tract, which includes the esophagus, stomach, and duodenum. The duodenum is the first part of the small intestine. An upper GI series is conducted by a radiology technologist or a radiologist-a doctor who specializes in x-ray imaging-at a hospital or outpatient center. While sitting or standing in front of an x-ray machine, the patient drinks barium liquid, which is often white and has a chalky consistency and taste. The barium liquid coats the lining of the upper GI tract and makes signs of disease show up more clearly on x rays. X-ray video, called fluoroscopy, is used to view the barium liquid moving through the esophagus, stomach, and duodenum. Additional x rays and fluoroscopy are performed while the patient lies on an x-ray table. To fully coat the upper GI tract with barium liquid, the technologist or radiologist may press on the abdomen or ask the patient to change position. Patients hold still in various positions, allowing the technologist or radiologist to take x rays of the upper GI tract at different angles. If a technologist conducts the upper GI series, a radiologist will later examine the images to look for problems.  This test typically takes about 1 hour to complete. __________________________________________________________________  [name] have been scheduled for a Barium  Esophogram at [facility] (1st floor of the hospital) Make certain not to have anything to eat or drink 6 hours prior to your test. If you need to reschedule for any reason, please contact radiology at [phone] to do so. __________________________________________________________________ A barium swallow is an examination that concentrates on views of the esophagus. This tends to be a double contrast exam (barium and two liquids which, when combined, create a gas to distend the wall of the oesophagus) or single contrast (non-ionic iodine based). The study is usually tailored to your symptoms so a good history is essential. Attention is paid during the study to the form, structure and configuration of the esophagus, looking for functional disorders (such as aspiration, dysphagia, achalasia, motility and reflux) EXAMINATION You may be asked to change into a gown, depending on the type of swallow being performed. A radiologist and radiographer will perform the procedure. The radiologist will advise you of the type of contrast selected for your procedure and direct you during the exam. You will be asked to stand, sit or lie in several different positions and to hold a small amount of fluid in your mouth before being asked to swallow while the imaging is performed .In some instances you may be asked to swallow barium coated marshmallows to assess the motility of a solid food bolus. The exam can be recorded as a digital or video fluoroscopy procedure. POST PROCEDURE It will take 1-2 days for the barium to pass through your system. To facilitate this, it is important, unless otherwise directed, to increase your fluids for the next 24-48hrs and to resume your normal diet.  This test typically takes about  30 minutes to perform. __________________________________________________________________________________

## 2017-06-21 NOTE — Progress Notes (Signed)
Patient ID: [name], female   DOB: 02-02-1932, 84 y.o.   MRN: [id number] HPI: [name] is an 84 year old female with a past medical history of hypertension, hypothyroidism, atrial fibrillation with history of stroke on Eliquis, remote breast cancer in the 1980s who is seen in consultation at the request of Mack Hook, NP to evaluate several issues including trouble with defecation, dysphagia, belching and flatulence, and fear of gastric cancer.  She is here alone today.  She was previously seen by Dr. Olevia Perches but it has been a number of years.  She brings in a list of concerns today and reports that she has anal pain if she pushes too hard.  At times it feels like her "anus is too tight" to pass stool.  She feels that something is "tearing with bowel movement".  She is having a bowel movement every 2-3 days.  She tries to avoid foods like rice because this causes constipation.  Occasionally she will see scant red blood with wiping.  She states that she has stools that are like "thick mud".  She tried MiraLAX for a very short amount of time but did not like it because it is "artificial".  Her last colonoscopy was normal in 1998-09-06 performed by Dr. Olevia Perches.  He does not have a family history of colon cancer.  Another issue that she is having is feeling as if soft foods stick in her mid chest.  This is intermittent and she pushes these down with water.  She denies feeling any odynophagia.  No heartburn.  No nausea.  No early satiety.  She had a friend who was diagnosed with stomach cancer when it was "too late" and so she fears this diagnosis.  No family history of stomach cancer.  She also states that she has frequent belching and flatulence.  This also happens if she eats too fast.  She eats a very high fiber diet.  She takes Eliquis for her history of stroke and atrial fibrillation.  She denies chest pain and shortness of breath.  She states she will be switching cardiologists but had previously  seen Dr. Lovena Le.  Her family history is notable for a father with melanoma, brother with brain cancer, mother with Hodgkin's lymphoma and her sister has had colitis.  She currently uses Synthroid, amlodipine, Eliquis, vitamin D, multivitamin with iron, fish oil, co-Q10, lisinopril and atorvastatin.  Past Medical History:  Diagnosis Date  . Anemia    hx of with surgery   . Arthritis 07-27-11   osteoarthritis-hips/s/p bil. THA, arthritis right hand   . Blood transfusion feb 2014   post surgery some time ago  . Cancer ([place]) 1989   Breast cancer -s/p lt. mastectomy, some squamous cell lesions  . Cold hands   . Complication of anesthesia    epinephrine - screams uncontrollably / pt does not want general anesthesia or narcotics  . Constipation   . Fuchs' corneal dystrophy    both eyes  . Hard of hearing    both ears mild loss  . Hypertension   . Hypothyroidism 3-21- 13   tx. levothyroxine  . Lichen sclerosus et atrophicus of the vulva    pt reported.   . Osteopenia   . Stroke [facility])    a. s/p MDT LINQ 04/2014    Past Surgical History:  Procedure Laterality Date  . ABDOMINAL HYSTERECTOMY     ovaries removed  . ANKLE SURGERY  2005   right ankle tendon repair  . basal  cell removed from face     3-4 times  . BREAST SURGERY  07-1987   lt.breast cancer intraductal cancer, mastectomy  . CATARACT EXTRACTION, BILATERAL  few yrs ago   Bilateral  . HARDWARE REMOVAL  04/05/2012   Procedure: HARDWARE REMOVAL;  Surgeon: Gearlean Alf, MD;  Location: [facility];  Service: Orthopedics;  Laterality: Right;  Removal of Hardware Right Femur   . HARDWARE REMOVAL Left 06/30/2013   Procedure: LEFT HIP HARDWARE REMOVAL OF CABLES;  Surgeon: Gearlean Alf, MD;  Location: [facility];  Service: Orthopedics;  Laterality: Left;  . HARDWARE REMOVAL Right 08/21/2013   Procedure: RIGHT HIP HARDWARE REMOVAL;  Surgeon: Gearlean Alf, MD;  Location: [facility];  Service: Orthopedics;  Laterality: Right;  . JOINT  REPLACEMENT  07-27-11   Bilateral: Right THA - 04/2000, Left THA 06/2001  . left hip replacement Left feb 2003  . LOOP RECORDER IMPLANT  04-08-2014   MDT LINQ implanted by Dr Lovena Le for cryptogenic stroke  . LOOP RECORDER REMOVAL N/A 04/26/2017   Procedure: LOOP RECORDER REMOVAL;  Surgeon: Evans Lance, MD;  Location: [place] [facility];  Service: Cardiovascular;  Laterality: N/A;  . MASTECTOMY  1989   left - Pt states has no restrictions on use of L arm for BP or blood draw  . melanoma removed Left    x 1  . ORIF FEMUR FRACTURE  07/31/2011   Procedure: OPEN REDUCTION INTERNAL FIXATION (ORIF) DISTAL FEMUR FRACTURE;  Surgeon: Gearlean Alf, MD;  Location: [facility];  Service: Orthopedics;  Laterality: Right;  right femur  (c-arm)   . SQUAMOUS CELL CARCINOMA EXCISION  07-27-11   x2 left shoulder  . TOTAL HIP REVISION  04/10/2012   Procedure: TOTAL HIP REVISION;  Surgeon: Gearlean Alf, MD;  Location: [facility];  Service: Orthopedics;  Laterality: Right;  . TOTAL HIP REVISION Left 06/27/2012   Procedure: LEFT TOTAL HIP REVISION;  Surgeon: Gearlean Alf, MD;  Location: [facility];  Service: Orthopedics;  Laterality: Left;    Outpatient Medications Prior to Visit  Medication Sig Dispense Refill  . amLODipine (NORVASC) 5 MG tablet TAKE 1 TABLET BY MOUTH  EVERY MORNING 90 tablet 3  . atorvastatin (LIPITOR) 10 MG tablet Take 0.5 tablets (5 mg total) by mouth daily. (Patient taking differently: Take 5 mg by mouth every evening. ) 45 tablet 2  . Coenzyme Q10 (COQ-10 PO) Take 1 tablet by mouth daily.    Marland Kitchen ELIQUIS 2.5 MG TABS tablet TAKE 1 TABLET BY MOUTH TWO  TIMES DAILY 180 tablet 1  . levothyroxine (SYNTHROID, LEVOTHROID) 100 MCG tablet TAKE 1 TABLET BY MOUTH  DAILY 90 tablet 1  . lisinopril (PRINIVIL,ZESTRIL) 10 MG tablet TAKE 1 TABLET BY MOUTH  DAILY (Patient taking differently: TAKE 1 TABLET BY MOUTH  DAILY IN THE EVENING) 90 tablet 3  . Multiple Vitamins-Iron (MULTIVITAMINS WITH IRON) TABS tablet  Take 1 tablet by mouth daily.     . Omega-3 Fatty Acids (FISH OIL) 1200 MG CAPS Take 1,200 mg by mouth 2 (two) times daily.     Marland Kitchen Propylene Glycol (SYSTANE BALANCE OP) Apply 1 drop to eye daily.    . Vitamin D, Cholecalciferol, 1000 UNITS TABS Take 1,000 Units by mouth daily.      No facility-administered medications prior to visit.     Allergies  Allergen Reactions  . Lactose Intolerance (Gi)     Bloating, gas  . Epinephrine Other (See Comments)  Reaction:  Caused pt to scream uncontrollably.    Family History  Problem Relation Age of Onset  . Hodgkin's lymphoma Mother   . Celiac disease Mother   . Cancer Mother   . Melanoma Father   . Cancer Father   . Cancer Brother   . Arthritis Sister   . Ulcerative colitis Sister   . Heart disease Paternal Grandmother   . Heart disease Paternal Grandfather     Social History   Tobacco Use  . Smoking status: Former Smoker    Packs/day: 1.00    Years: 10.00    Pack years: 10.00    Types: Cigarettes    Last attempt to quit: 05/09/1963    Years since quitting: 54.1  . Smokeless tobacco: Never Used  Substance Use Topics  . Alcohol use: Yes    Alcohol/week: 0.6 oz    Types: 1 Glasses of wine per week    Comment: very little  . Drug use: No    ROS: As per history of present illness, otherwise negative  BP 122/74   Pulse 68   Ht 5\' 5"  (1.651 m)   Wt 127 lb (57.6 kg)   BMI 21.13 kg/m  Constitutional: Well-developed and well-nourished. No distress. HEENT: Normocephalic and atraumatic. Oropharynx is clear and moist. Conjunctivae are normal.  No scleral icterus. Neck: Neck supple. Trachea midline. Cardiovascular: Normal rate, regular rhythm and intact distal pulses. No M/R/G Pulmonary/chest: Effort normal and breath sounds normal. No wheezing, rales or rhonchi. Abdominal: Soft, nontender, nondistended. Bowel sounds active throughout. There are no masses palpable. No hepatosplenomegaly. Rectal: No external lesions, tenderness  or rash, small internal hemorrhoids can be seen with minor prolapse, digital rectal exam is nontender without mass lesion, there is hard stool in the rectal vault which is heme negative Extremities: no clubbing, cyanosis, or edema Neurological: Alert and oriented to person place and time. Skin: Skin is warm and dry.  Psychiatric: Normal mood and affect. Behavior is normal.  RELEVANT LABS AND IMAGING: CBC    Component Value Date/Time   WBC 5.5 09/12/2016 0906   RBC 4.35 09/12/2016 0906   HGB 13.2 09/12/2016 0906   HCT 40.1 09/12/2016 0906   PLT 253 09/12/2016 0906   MCV 92.2 09/12/2016 0906   MCH 30.3 09/12/2016 0906   MCHC 32.9 09/12/2016 0906   RDW 14.8 09/12/2016 0906   LYMPHSABS 1,430 09/12/2016 0906   MONOABS 660 09/12/2016 0906   EOSABS 55 09/12/2016 0906   BASOSABS 55 09/12/2016 0906    CMP     Component Value Date/Time   NA 134 (L) 03/20/2017 0946   K 5.2 03/20/2017 0946   CL 100 03/20/2017 0946   CO2 29 03/20/2017 0946   GLUCOSE 80 03/20/2017 0946   BUN 15 03/20/2017 0946   CREATININE 0.65 03/20/2017 0946   CALCIUM 9.5 03/20/2017 0946   PROT 7.4 09/12/2016 0906   ALBUMIN 4.8 09/12/2016 0906   AST 19 09/12/2016 0906   ALT 14 09/12/2016 0906   ALKPHOS 55 09/12/2016 0906   BILITOT 0.8 09/12/2016 0906   GFRNONAA 81 (L) 03/14/2014 2107   GFRAA >90 03/14/2014 2107    ASSESSMENT/PLAN: 84 year old female with a past medical history of hypertension, hypothyroidism, atrial fibrillation with history of stroke on Eliquis, remote breast cancer in the 1980s who is seen in consultation at the request of Mack Hook, NP to evaluate several issues including trouble with defecation, dysphagia, belching and flatulence, and fear of gastric cancer.   1. Dysphagia/fear of  stomach cancer --my impression is that her intermittent dysphagia is likely secondary to esophageal dysmotility/presbyesophagus.  I have recommended a barium esophagram with tablet to rule out structural  abnormality.  Suspicion for major pathology such as tumor is very low.  She is worried about gastric cancer but has really no real risk for gastric cancer or gastric cancer symptoms.  I provided reassurance to this fact.  We will have an upper GI included with the barium esophagram to evaluate her stomach and proximal small bowel.  This will likely provide reassurance to her.  2.  Issues with defecation/trouble constipation/probable pelvic floor dysfunction --rectal exam reveals hard stool in the vault making constipation likely contributing factor to her issues with defecation and anal pain.  No fissure seen today.  I have recommended that she begin Benefiber 1 tablespoon daily.  She is opposed to [facility] because it is artificial.  I also have encouraged her to drink 6-8 ounces of water 6-8 times per day if possible.  I am also going to start her on Align daily as a probiotic.  I am going to see her back in 8 weeks and if she is still having further issues we can consider additional testing including colonoscopy.  She would like to avoid colonoscopy if possible.      ID:[id number], [facility], [place], [place] [id number]

## 2017-06-23 ENCOUNTER — Encounter: Payer: Self-pay | Admitting: Internal Medicine

## 2017-07-04 ENCOUNTER — Encounter: Payer: Self-pay | Admitting: Internal Medicine

## 2017-07-04 NOTE — Telephone Encounter (Signed)
Please let pt know I received her message I would recommend she give MiraLax a try at 17g daily (she felt that this was artificial), but ensure her this is safe and not habit forming. I think this, along with the Benefiber, will really help. I would have her stop the Align and not add another probiotic for now The MiraLax will take a few days to improve her stools After another 2 weeks she can update [place] again on her progress

## 2017-07-05 ENCOUNTER — Ambulatory Visit (HOSPITAL_COMMUNITY): Payer: Medicare Other

## 2017-07-09 ENCOUNTER — Ambulatory Visit (HOSPITAL_COMMUNITY)
Admission: RE | Admit: 2017-07-09 | Discharge: 2017-07-09 | Disposition: A | Discharge status: Home / Self Care | Payer: Medicare Other | Source: Ambulatory Visit | Attending: Internal Medicine | Admitting: Internal Medicine

## 2017-07-09 DIAGNOSIS — R1310 Dysphagia, unspecified: Secondary | ICD-10-CM | POA: Diagnosis present

## 2017-07-13 ENCOUNTER — Encounter: Payer: Self-pay | Admitting: Internal Medicine

## 2017-07-16 ENCOUNTER — Encounter: Payer: Self-pay | Admitting: Family Medicine

## 2017-07-16 DIAGNOSIS — I480 Paroxysmal atrial fibrillation: Secondary | ICD-10-CM

## 2017-07-17 MED ORDER — APIXABAN 2.5 MG PO TABS: 2.5000 mg | ORAL_TABLET | Freq: Two times a day (BID) | ORAL | 1 refills | Status: DC

## 2017-07-21 ENCOUNTER — Encounter: Payer: Self-pay | Admitting: Family Medicine

## 2017-07-21 DIAGNOSIS — I10 Essential (primary) hypertension: Secondary | ICD-10-CM

## 2017-07-23 MED ORDER — LISINOPRIL 10 MG PO TABS: 10.0000 mg | ORAL_TABLET | Freq: Every day | ORAL | 0 refills | Status: DC

## 2017-07-23 MED ORDER — ATORVASTATIN CALCIUM 10 MG PO TABS: 5.0000 mg | ORAL_TABLET | Freq: Every day | ORAL | 0 refills | Status: DC

## 2017-07-23 MED ORDER — AMLODIPINE BESYLATE 5 MG PO TABS: 5.0000 mg | ORAL_TABLET | Freq: Every morning | ORAL | 0 refills | Status: DC

## 2017-07-30 ENCOUNTER — Encounter: Payer: Self-pay | Admitting: Family Medicine

## 2017-08-03 ENCOUNTER — Ambulatory Visit: Payer: Medicare Other | Admitting: Family Medicine

## 2017-08-16 ENCOUNTER — Ambulatory Visit: Payer: Medicare Other | Admitting: Family Medicine

## 2017-08-16 ENCOUNTER — Encounter: Payer: Self-pay | Admitting: Family Medicine

## 2017-08-16 VITALS — BP 122/72 | HR 53 | Resp 16 | Ht 64.5 in | Wt 124.8 lb

## 2017-08-16 DIAGNOSIS — L853 Xerosis cutis: Secondary | ICD-10-CM | POA: Diagnosis not present

## 2017-08-16 DIAGNOSIS — Z8673 Personal history of transient ischemic attack (TIA), and cerebral infarction without residual deficits: Secondary | ICD-10-CM | POA: Diagnosis not present

## 2017-08-16 DIAGNOSIS — I480 Paroxysmal atrial fibrillation: Secondary | ICD-10-CM

## 2017-08-16 DIAGNOSIS — E785 Hyperlipidemia, unspecified: Secondary | ICD-10-CM

## 2017-08-16 DIAGNOSIS — Z79899 Other long term (current) drug therapy: Secondary | ICD-10-CM

## 2017-08-16 DIAGNOSIS — I10 Essential (primary) hypertension: Secondary | ICD-10-CM

## 2017-08-16 DIAGNOSIS — R5383 Other fatigue: Secondary | ICD-10-CM | POA: Diagnosis not present

## 2017-08-16 DIAGNOSIS — E039 Hypothyroidism, unspecified: Secondary | ICD-10-CM

## 2017-08-16 DIAGNOSIS — M6289 Other specified disorders of muscle: Secondary | ICD-10-CM | POA: Diagnosis not present

## 2017-08-16 NOTE — Progress Notes (Signed)
Subjective:    Patient ID: [name], female    DOB: [birth year], 84 y.o.   MRN: [id number]  HPI Chief Complaint  Patient presents with  . Follow-up    blood work   She is here with complaints of chronic fatigue, stiff upper leg muscles, dry skin, and chronic constipation. Symptoms ongoing for months. Gradually worsening bilateral quadriceps stiffness especially in the mornings.  States she thinks her symptoms may be related to her thyroid or the statin she is taking daily. Reports good compliance. Atorvasatin 5 mg daily.  States she continues to do her PT, exercises at the gym and hikes but it is becoming more difficult.  She requests labs today and is fasting.  Taking levothyroxine 100 mg daily.  She is taking a vitamin D supplement daily.   Denies acute muscle weakness but does think her strength is gradually declining overall.  No arthralgias. Denies numbness, tingling or weakness.   History of CVA and paroxysmal A-fib. Dr. Lovena Le has been following her for this. She recently had a "loop recorder" removed. States she has been told she should consider a pacemaker but she has not wanted to do this. She has issues with bradycardia but states she is asymptomatic. She is taking low dose Eliquis. Denies any abnormal bleeding.   States she would like to see a new cardiologist and get a second opinion. She plans to call [facility] and ask to establish with a new cardiologist.   Issues with constipation, ongoing. States she is taking Miralax daily and this is helping with constipation. Dr. Hilarie Fredrickson is her GI.   Denies fever, chills, dizziness, chest pain, palpitations, shortness of breath, abdominal pain, N/V/D, urinary symptoms, LE edema.   Reviewed allergies, medications, past medical, surgical, family, and social history.    Review of Systems Pertinent positives and negatives in the history of present illness.     Objective:   Physical Exam  Constitutional: She is oriented to  person, place, and time. She appears well-developed and well-nourished. No distress.  HENT:  Mouth/Throat: Uvula is midline, oropharynx is clear and moist and mucous membranes are normal.  Eyes: Pupils are equal, round, and reactive to light. Conjunctivae and lids are normal.  Neck: Full passive range of motion without pain. Neck supple. No JVD present. No thyromegaly present.  Cardiovascular: Regular rhythm and intact distal pulses. Bradycardia present. Exam reveals no gallop and no friction rub.  No murmur heard. No LE edema   Pulmonary/Chest: Effort normal and breath sounds normal.  Musculoskeletal: Normal range of motion.  Lymphadenopathy:    She has no cervical adenopathy.  Neurological: She is alert and oriented to person, place, and time. She has normal strength. No cranial nerve deficit or sensory deficit. Coordination and gait normal.  Skin: Skin is warm and dry. No rash noted. No pallor.  Psychiatric: She has a normal mood and affect. Her speech is normal and behavior is normal. Thought content normal. Cognition and memory are normal.   BP 122/72   Pulse (!) 53   Resp 16   Ht 5' 4.5" (1.638 m)   Wt 124 lb 12.8 oz (56.6 kg)   SpO2 98%   BMI 21.09 kg/m       Assessment & Plan:  Fatigue, unspecified type - Plan: CBC with Differential/Platelet, Comprehensive metabolic panel, T4, free, T3, TSH, Iron, TIBC and Ferritin Panel, VITAMIN D 25 Hydroxy (Vit-D Deficiency, Fractures)  Hypothyroidism, unspecified type - Plan: T4, free, T3, TSH  Dyslipidemia -  Plan: Lipid panel  Essential hypertension - Plan: CBC with Differential/Platelet, Comprehensive metabolic panel  Paroxysmal atrial fibrillation (HCC)  History of CVA (cerebrovascular accident) without residual deficits  Muscle stiffness - Plan: Lipid panel, Sedimentation rate, CK  Medication management - Plan: VITAMIN D 25 Hydroxy (Vit-D Deficiency, Fractures)  Dry skin - Plan: T4, free, T3, TSH  Discussed that her exam  is unremarkable. We will have her stop her statin for the next 2 weeks to see if her muscle stiffness improves. Check labs to look for underlying etiology for muscle stiffness and fatigue.  HTN- BP is in goal range. Continue on current medication Hypothyroidism- check thyroid panel. Adjust levothyroxine as needed.  A-fib- she is in a regular rhythm today. Continue on low dose Eliquis. She is bradycardic and this is not new. She plans to call [facility] and establish with a new cardiologist.  Vitamin D deficiency- continue on daily supplement and check vitamin D level.  Follow up pending labs.

## 2017-08-16 NOTE — Patient Instructions (Addendum)
Stop your cholesterol medication for the next 2 weeks and let's see if your symptoms improve or not.

## 2017-08-17 ENCOUNTER — Other Ambulatory Visit: Payer: Self-pay | Admitting: Family Medicine

## 2017-08-17 DIAGNOSIS — E875 Hyperkalemia: Secondary | ICD-10-CM

## 2017-08-17 LAB — COMPREHENSIVE METABOLIC PANEL
ALBUMIN: 4.6 g/dL (ref 3.5–4.7)
ALK PHOS: 65 IU/L (ref 39–117)
ALT: 14 IU/L (ref 0–32)
AST: 20 IU/L (ref 0–40)
Albumin/Globulin Ratio: 2.1 (ref 1.2–2.2)
BILIRUBIN TOTAL: 0.6 mg/dL (ref 0.0–1.2)
BUN / CREAT RATIO: 20 (ref 12–28)
BUN: 13 mg/dL (ref 8–27)
CO2: 25 mmol/L (ref 20–29)
CREATININE: 0.65 mg/dL (ref 0.57–1.00)
Calcium: 9.8 mg/dL (ref 8.7–10.3)
Chloride: 102 mmol/L (ref 96–106)
GFR calc Af Amer: 94 mL/min/{1.73_m2} (ref >59)
GFR calc non Af Amer: 82 mL/min/{1.73_m2} (ref >59)
GLOBULIN, TOTAL: 2.2 g/dL (ref 1.5–4.5)
Glucose: 84 mg/dL (ref 65–99)
Potassium: 5.4 mmol/L — ABNORMAL HIGH (ref 3.5–5.2)
SODIUM: 138 mmol/L (ref 134–144)
Total Protein: 6.8 g/dL (ref 6.0–8.5)

## 2017-08-17 LAB — IRON,TIBC AND FERRITIN PANEL
Ferritin: 148 ng/mL (ref 15–150)
IRON: 87 ug/dL (ref 27–139)
Iron Saturation: 35 % (ref 15–55)
Total Iron Binding Capacity: 248 ug/dL — ABNORMAL LOW (ref 250–450)
UIBC: 161 ug/dL (ref 118–369)

## 2017-08-17 LAB — CBC WITH DIFFERENTIAL/PLATELET
BASOS: 1 %
Basophils Absolute: 0 10*3/uL (ref 0.0–0.2)
EOS (ABSOLUTE): 0.1 10*3/uL (ref 0.0–0.4)
EOS: 2 %
HEMATOCRIT: 36.8 % (ref 34.0–46.6)
HEMOGLOBIN: 12.3 g/dL (ref 11.1–15.9)
Immature Grans (Abs): 0 10*3/uL (ref 0.0–0.1)
Immature Granulocytes: 0 %
LYMPHS ABS: 1.4 10*3/uL (ref 0.7–3.1)
Lymphs: 26 %
MCH: 30.2 pg (ref 26.6–33.0)
MCHC: 33.4 g/dL (ref 31.5–35.7)
MCV: 90 fL (ref 79–97)
MONOCYTES: 14 %
Monocytes Absolute: 0.7 10*3/uL (ref 0.1–0.9)
NEUTROS ABS: 3.1 10*3/uL (ref 1.4–7.0)
Neutrophils: 57 %
Platelets: 228 10*3/uL (ref 150–379)
RBC: 4.07 x10E6/uL (ref 3.77–5.28)
RDW: 15.6 % — ABNORMAL HIGH (ref 12.3–15.4)
WBC: 5.3 10*3/uL (ref 3.4–10.8)

## 2017-08-17 LAB — LIPID PANEL
CHOL/HDL RATIO: 2.4 ratio (ref 0.0–4.4)
Cholesterol, Total: 188 mg/dL (ref 100–199)
HDL: 79 mg/dL (ref >39)
LDL CALC: 98 mg/dL (ref 0–99)
Triglycerides: 53 mg/dL (ref 0–149)
VLDL CHOLESTEROL CAL: 11 mg/dL (ref 5–40)

## 2017-08-17 LAB — CK: CK TOTAL: 121 U/L (ref 24–173)

## 2017-08-17 LAB — TSH: TSH: 2.3 u[IU]/mL (ref 0.450–4.500)

## 2017-08-17 LAB — T4, FREE: FREE T4: 1.32 ng/dL (ref 0.82–1.77)

## 2017-08-17 LAB — T3: T3, Total: 75 ng/dL (ref 71–180)

## 2017-08-17 LAB — SEDIMENTATION RATE: SED RATE: 10 mm/h (ref 0–40)

## 2017-08-17 LAB — VITAMIN D 25 HYDROXY (VIT D DEFICIENCY, FRACTURES): Vit D, 25-Hydroxy: 39 ng/mL (ref 30.0–100.0)

## 2017-08-17 NOTE — Progress Notes (Signed)
   Subjective:    Patient ID: [name], female    DOB: 19-Aug-1931, 84 y.o.   MRN: [id number]  HPI    Review of Systems     Objective:   Physical Exam        Assessment & Plan:

## 2017-08-18 ENCOUNTER — Encounter: Payer: Self-pay | Admitting: Family Medicine

## 2017-08-18 DIAGNOSIS — R001 Bradycardia, unspecified: Secondary | ICD-10-CM

## 2017-08-18 DIAGNOSIS — Z8673 Personal history of transient ischemic attack (TIA), and cerebral infarction without residual deficits: Secondary | ICD-10-CM

## 2017-08-18 DIAGNOSIS — I480 Paroxysmal atrial fibrillation: Secondary | ICD-10-CM

## 2017-08-21 ENCOUNTER — Other Ambulatory Visit: Payer: Medicare Other

## 2017-08-21 DIAGNOSIS — E875 Hyperkalemia: Secondary | ICD-10-CM

## 2017-08-21 LAB — BASIC METABOLIC PANEL
BUN/Creatinine Ratio: 19 (ref 12–28)
BUN: 16 mg/dL (ref 8–27)
CO2: 25 mmol/L (ref 20–29)
Calcium: 10 mg/dL (ref 8.7–10.3)
Chloride: 100 mmol/L (ref 96–106)
Creatinine, Ser: 0.84 mg/dL (ref 0.57–1.00)
GFR, EST AFRICAN AMERICAN: 74 mL/min/{1.73_m2} (ref >59)
GFR, EST NON AFRICAN AMERICAN: 64 mL/min/{1.73_m2} (ref >59)
Glucose: 46 mg/dL — ABNORMAL LOW (ref 65–99)
POTASSIUM: 4.9 mmol/L (ref 3.5–5.2)
SODIUM: 139 mmol/L (ref 134–144)

## 2017-08-28 ENCOUNTER — Telehealth: Payer: Self-pay | Admitting: Internal Medicine

## 2017-08-28 NOTE — Telephone Encounter (Signed)
New message   Pt verbalized that she wants to transfer care from Dr.Taylor and now be under the care of Dr.Turner   Are both providers okay with this transfer of care?

## 2017-08-28 NOTE — Telephone Encounter (Signed)
Spoke with Pt.  Per review of Pt visit with her PCP Pt wanting to establish with another cardiologist for 2nd opinion (Dr. Lovena Le has advised Pt she may need a pacemaker for sinus node dysfunction). Pt had called office wanting to change from Dr. Lovena Le to Dr. Radford Pax.  Per Dr. Radford Pax, Pt's issues are electrophysiologic in nature. Call placed to Pt.  Notified Pt she did not require a general cardiologist at this time, advised if she would like another EP cardiologist she can choose Caryl Comes, Camnitz or Allred.  Per Pt she would like to see Dr. Caryl Comes. Gave Pt information to Melissa to schedule new patient appt with Dr. Caryl Comes.  Not urgent.

## 2017-08-28 NOTE — Telephone Encounter (Signed)
Her problems are mainly EP - would prefer she stay with Dr. Lovena Le

## 2017-09-10 ENCOUNTER — Other Ambulatory Visit: Payer: Self-pay | Admitting: Family Medicine

## 2017-09-10 DIAGNOSIS — I10 Essential (primary) hypertension: Secondary | ICD-10-CM

## 2017-09-11 ENCOUNTER — Ambulatory Visit: Payer: Medicare Other | Admitting: Internal Medicine

## 2017-09-11 ENCOUNTER — Encounter: Payer: Self-pay | Admitting: Internal Medicine

## 2017-09-11 VITALS — BP 100/70 | HR 58 | Ht 64.5 in | Wt 128.0 lb

## 2017-09-11 DIAGNOSIS — M6289 Other specified disorders of muscle: Secondary | ICD-10-CM

## 2017-09-11 DIAGNOSIS — R1310 Dysphagia, unspecified: Secondary | ICD-10-CM

## 2017-09-11 DIAGNOSIS — K5900 Constipation, unspecified: Secondary | ICD-10-CM | POA: Diagnosis not present

## 2017-09-11 NOTE — Patient Instructions (Signed)
Discontinue Miralax.  Continue Benefiber as directed.  Please purchase the following medications over the counter and take as directed: Senna-as needed  Please follow up with Dr Hilarie Fredrickson as needed.  If you are age 82 or older, your body mass index should be between 23-30. Your Body mass index is 21.63 kg/m. If this is out of the aforementioned range listed, please consider follow up with your Primary Care Provider.  If you are age 53 or younger, your body mass index should be between 19-25. Your Body mass index is 21.63 kg/m. If this is out of the aformentioned range listed, please consider follow up with your Primary Care Provider.

## 2017-09-11 NOTE — Progress Notes (Signed)
Subjective:    Patient ID: [name], female    DOB: 06/12/1931, 84 y.o.   MRN: [id number]  HPI Kinzly Pierrelouis is an 84 year old female with a history of constipation, anal fissure, probable pelvic floor dysfunction, atrial fibrillation on Eliquis, hypothyroidism, hypertension who is here for follow-up.  She presents alone today and was initially seen on 06/21/2017.  At her last visit we discussed intermittent dysphagia and her fear of stomach cancer.  This led to an upper GI series which was reassuring.  This showed normal esophagus, stomach and proximal small bowel.  She reports she will still have intermittent issues with swallowing particularly foods like mashed potatoes or thick soups.  Occasionally she will help foods go down with water.  This is not causing her trouble and has not been progressive.  From a bowel perspective she tried probiotic which was stopped in short order.  She then also has tried MiraLAX though does not like taking this.  She states that she is now using a teaspoon every other day but still feels that she has "too much activity" in her bowel.  There is gas and borborygmi.  This can bother her at night.  She is no longer having a hard stools, difficulty with straining at stool or bleeding.  She is using Benefiber which she has enjoyed using.  She has remained on Eliquis.  She did have 2 prolonged episodes of A. fib in the last several weeks lasting 3 to 4 hours but resolving spontaneously.  Review of Systems As per HPI, otherwise negative  Current Medications, Allergies, Past Medical History, Past Surgical History, Family History and Social History were reviewed in [employer] record.     Objective:   Physical Exam BP 100/70   Pulse (!) 58   Ht 5' 4.5" (1.638 m)   Wt 128 lb (58.1 kg)   BMI 21.63 kg/m  Constitutional: Well-developed and well-nourished. No distress. HEENT: Normocephalic and atraumatic.  No scleral icterus. Neurological:  Alert and oriented to person place and time. Skin: Skin is warm and dry. Psychiatric: Normal mood and affect. Behavior is normal.  CBC    Component Value Date/Time   WBC 5.3 08/16/2017 0904   WBC 5.5 09/12/2016 0906   RBC 4.07 08/16/2017 0904   RBC 4.35 09/12/2016 0906   HGB 12.3 08/16/2017 0904   HCT 36.8 08/16/2017 0904   PLT 228 08/16/2017 0904   MCV 90 08/16/2017 0904   MCH 30.2 08/16/2017 0904   MCH 30.3 09/12/2016 0906   MCHC 33.4 08/16/2017 0904   MCHC 32.9 09/12/2016 0906   RDW 15.6 (H) 08/16/2017 0904   LYMPHSABS 1.4 08/16/2017 0904   MONOABS 660 09/12/2016 0906   EOSABS 0.1 08/16/2017 0904   BASOSABS 0.0 08/16/2017 0904        Assessment & Plan:  84 year old female with a history of constipation, anal fissure, probable pelvic floor dysfunction, atrial fibrillation on Eliquis, hypothyroidism, hypertension who is here for follow-up.  1.  Constipation/probable pelvic floor dysfunction --MiraLAX while it has caused improvement in her constipation and difficulty with defecation, she does not like the overall response.  She would prefer to discontinue this medication which we will do.  She will continue with Benefiber on a daily basis.  She prefers to try senna nightly on an as-needed basis.  She has used this in the past with success.  If she has recurrent issues with hard stool, difficult defecation or any rectal  bleeding she is asked to notify me.  She voices understanding.  2.  Dysphagia --mild and intermittent.  Reassuring upper GI series.  We discussed upper endoscopy with dilation but at this point given the reassuring esophagram and mild nature to her symptoms along with the need to discontinue Eliquis, we will continue observation.  She will notify me if the symptom worsens  We will have her follow-up on an as-needed basis 15 minutes spent with the patient today. Greater than 50% was spent in counseling and coordination of care with the patient

## 2017-09-18 ENCOUNTER — Other Ambulatory Visit: Payer: Self-pay | Admitting: Family Medicine

## 2017-09-25 ENCOUNTER — Ambulatory Visit: Payer: Medicare Other | Admitting: Family Medicine

## 2017-09-29 ENCOUNTER — Other Ambulatory Visit: Payer: Self-pay | Admitting: Family Medicine

## 2017-09-29 DIAGNOSIS — I480 Paroxysmal atrial fibrillation: Secondary | ICD-10-CM

## 2017-10-02 NOTE — Telephone Encounter (Signed)
Is this okay to refill? 

## 2017-10-02 NOTE — Telephone Encounter (Signed)
ok 

## 2017-11-06 LAB — HM DEXA SCAN

## 2017-11-06 LAB — HM MAMMOGRAPHY

## 2017-11-09 ENCOUNTER — Encounter: Payer: Self-pay | Admitting: Family Medicine

## 2017-11-13 ENCOUNTER — Encounter: Payer: Self-pay | Admitting: Family Medicine

## 2017-11-20 ENCOUNTER — Ambulatory Visit: Payer: Medicare Other | Admitting: Internal Medicine

## 2017-11-20 ENCOUNTER — Encounter: Payer: Self-pay | Admitting: Internal Medicine

## 2017-11-20 VITALS — BP 132/78 | HR 50 | Ht 64.5 in | Wt 126.6 lb

## 2017-11-20 DIAGNOSIS — I10 Essential (primary) hypertension: Secondary | ICD-10-CM

## 2017-11-20 DIAGNOSIS — I639 Cerebral infarction, unspecified: Secondary | ICD-10-CM | POA: Diagnosis not present

## 2017-11-20 DIAGNOSIS — I480 Paroxysmal atrial fibrillation: Secondary | ICD-10-CM | POA: Diagnosis not present

## 2017-11-20 DIAGNOSIS — I495 Sick sinus syndrome: Secondary | ICD-10-CM

## 2017-11-20 MED ORDER — IRBESARTAN 75 MG PO TABS: 75.0000 mg | ORAL_TABLET | Freq: Every day | ORAL | 3 refills | Status: DC

## 2017-11-20 MED ORDER — RIVAROXABAN 15 MG PO TABS: 15.0000 mg | ORAL_TABLET | Freq: Every day | ORAL | 11 refills | Status: DC

## 2017-11-20 NOTE — Patient Instructions (Signed)
Medication Instructions:  Your physician has recommended you make the following change in your medication:   1. Stop Lisinopril 2. Begin Irbasartan, 75mg , one tablet per day 3. In 2 weeks, call the office to discuss your cough and how you are tolerating your new medication. At this time, you may switch to Xarelto, 15mg  tablet everyday.   Labwork: None ordered.  Testing/Procedures: None ordered.  Follow-Up: Your physician recommends that you schedule a follow-up appointment depending on how you tolerate your medication changes.   Any Other Special Instructions Will Be Listed Below (If Applicable).     If you need a refill on your cardiac medications before your next appointment, please call your pharmacy. Marland Kitchen

## 2017-11-20 NOTE — Progress Notes (Signed)
Patient Care Team: Girtha Rm, NP-C as PCP - General (Nurse Practitioner)   HPI  [name] is a 84 y.o. female Is seen at her request to establish with a new cardiologist.  She has a history of cryptogenic stroke.  A Linq recorder was inserted and identified atrial fibrillation she was started on anticoagulation.  In addition, the Linq recorder demonstrated posttermination pauses.  This occurred at night.  She has had no syncope.  She is not interested in a pacemaker.  Her resting rates by ECG are in the 50s.  She is able to generate a heart rate of about 100 at the gym.  She has had interval atrial fibrillation.  It makes her feel terrible.  She sits down.  She is also complaining of fatigue.  This is been worse over the last year.  She takes apixaban.  She has hypertension has been on lisinopril.  She complains of a nonproductive cough.    Records and Results Reviewed   Past Medical History:  Diagnosis Date  . Anemia    hx of with surgery   . Arthritis 07-27-11   osteoarthritis-hips/s/p bil. THA, arthritis right hand   . Blood transfusion feb 2014   post surgery some time ago  . Cancer ([place]) 1989   Breast cancer -s/p lt. mastectomy, some squamous cell lesions  . Cold hands   . Complication of anesthesia    epinephrine - screams uncontrollably / pt does not want general anesthesia or narcotics  . Constipation   . Fuchs' corneal dystrophy    both eyes  . Hard of hearing    both ears mild loss  . Hypertension   . Hypothyroidism 3-21- 13   tx. levothyroxine  . Lichen sclerosus et atrophicus of the vulva    pt reported.   . Osteopenia   . Stroke [facility])    a. s/p MDT LINQ 04/2014    Past Surgical History:  Procedure Laterality Date  . ABDOMINAL HYSTERECTOMY     ovaries removed  . ANKLE SURGERY  2005   right ankle tendon repair  . basal cell removed from face     3-4 times  . BREAST SURGERY  07-1987   lt.breast cancer intraductal cancer,  mastectomy  . CATARACT EXTRACTION, BILATERAL  few yrs ago   Bilateral  . HARDWARE REMOVAL  04/05/2012   Procedure: HARDWARE REMOVAL;  Surgeon: Gearlean Alf, MD;  Location: [facility];  Service: Orthopedics;  Laterality: Right;  Removal of Hardware Right Femur   . HARDWARE REMOVAL Left 06/30/2013   Procedure: LEFT HIP HARDWARE REMOVAL OF CABLES;  Surgeon: Gearlean Alf, MD;  Location: [facility];  Service: Orthopedics;  Laterality: Left;  . HARDWARE REMOVAL Right 08/21/2013   Procedure: RIGHT HIP HARDWARE REMOVAL;  Surgeon: Gearlean Alf, MD;  Location: [facility];  Service: Orthopedics;  Laterality: Right;  . JOINT REPLACEMENT  07-27-11   Bilateral: Right THA - 04/2000, Left THA 06/2001  . left hip replacement Left feb 2003  . LOOP RECORDER IMPLANT  04-08-2014   MDT LINQ implanted by Dr Lovena Le for cryptogenic stroke  . LOOP RECORDER REMOVAL N/A 04/26/2017   Procedure: LOOP RECORDER REMOVAL;  Surgeon: Evans Lance, MD;  Location: [place] [facility];  Service: Cardiovascular;  Laterality: N/A;  . MASTECTOMY  1989   left - Pt states has no restrictions on use of L arm for BP or blood draw  . melanoma removed Left  x 1  . ORIF FEMUR FRACTURE  07/31/2011   Procedure: OPEN REDUCTION INTERNAL FIXATION (ORIF) DISTAL FEMUR FRACTURE;  Surgeon: Gearlean Alf, MD;  Location: [facility];  Service: Orthopedics;  Laterality: Right;  right femur  (c-arm)   . SQUAMOUS CELL CARCINOMA EXCISION  07-27-11   x2 left shoulder  . TOTAL HIP REVISION  04/10/2012   Procedure: TOTAL HIP REVISION;  Surgeon: Gearlean Alf, MD;  Location: [facility];  Service: Orthopedics;  Laterality: Right;  . TOTAL HIP REVISION Left 06/27/2012   Procedure: LEFT TOTAL HIP REVISION;  Surgeon: Gearlean Alf, MD;  Location: [facility];  Service: Orthopedics;  Laterality: Left;    Current Meds  Medication Sig  . amLODipine (NORVASC) 5 MG tablet Take 1 tablet (5 mg total) by mouth every morning.  . Coenzyme Q10 (COQ-10 PO) Take 1 tablet by mouth  daily.  Marland Kitchen ELIQUIS 2.5 MG TABS tablet TAKE 1 TABLET BY MOUTH TWO  TIMES DAILY  . levothyroxine (SYNTHROID, LEVOTHROID) 100 MCG tablet TAKE 1 TABLET BY MOUTH  DAILY  . lisinopril (PRINIVIL,ZESTRIL) 10 MG tablet TAKE 1 TABLET BY MOUTH  DAILY  . Multiple Vitamins-Iron (MULTIVITAMINS WITH IRON) TABS tablet Take 1 tablet by mouth daily.   . Omega-3 Fatty Acids (FISH OIL) 1200 MG CAPS Take 1,200 mg by mouth 2 (two) times daily.   Marland Kitchen Propylene Glycol (SYSTANE BALANCE OP) Apply 1 drop to eye daily.  . Vitamin D, Cholecalciferol, 1000 UNITS TABS Take 1,000 Units by mouth daily.     Allergies  Allergen Reactions  . Lactose Intolerance (Gi)     Bloating, gas  . Epinephrine Other (See Comments)     Reaction:  Caused pt to scream uncontrollably.      Review of Systems negative except from HPI and PMH  Physical Exam BP 132/78   Pulse (!) 50   Ht 5' 4.5" (1.638 m)   Wt 126 lb 9.6 oz (57.4 kg)   SpO2 99%   BMI 21.40 kg/m  Well developed and well nourished in no acute distress HENT normal E scleral and icterus clear Neck Supple JVP flat; carotids brisk and full Clear to ausculation Regular rate and rhythm, no murmurs gallops or rub Soft with active bowel sounds No clubbing cyanosis  Edema Alert and oriented, grossly normal motor and sensory function Skin Warm and Dry  ECG 50 17/09/43  Assessment and  Plan  Cryptogenic Stroke  Fatigue  Hypertension  Renal insufficiency grade 3  Cough   We will stop her lisinopril as it may be responsible for her cough.  We will put her on irbesartan 75 mg.  The fatigue may be related to apixaban.  This is reported in more than 1%.  After 2 weeks on the irbesartan, we will have her switch from apixaban to Xarelto at 15     BP well controlled  We reviewed her event recorder strips prior to explantation of her Linq.  It was associated with atrial fibrillation with rapid rates, over 150 as well as a posttermination pause of over 5 seconds.   She has not had any presyncope.  We have discussed that she might have issues with this and that pacing remediate this.  She is not interested in a pacemaker.  More than 50% of 45 min was spent in counseling related to the above      Current medicines are reviewed at length with the patient today .  The patient does not  have concerns regarding  medicines.

## 2017-11-21 ENCOUNTER — Encounter: Payer: Self-pay | Admitting: Family Medicine

## 2018-01-02 NOTE — Progress Notes (Signed)
[name] is a 84 y.o. female who presents for annual wellness visit and follow-up on chronic medical conditions.  She has the following concerns:  Recently visited [place] for music camp. States she is doing very well. No sign of depression. No falls. Reports having full ability to care for herself and even others.   She recently saw Dr. Caryl Comes and her lisinopril was switched to irbesartan and cough has resolved.  Continues on Eliquis. No bleeding.   Ears feeling clogged. She has hearing aids in. No pain or URI symptoms.    Immunization History  Administered Date(s) Administered  . Gamma Globulin 10/09/1983, 12/18/1984, 07/24/1989  . Hepatitis A 10/19/1995  . Hepatitis B 06/08/2009  . IPV 12/07/1969, 12/19/1983, 11/25/1984, 01/08/1986  . Influenza, High Dose Seasonal PF 02/07/2017  . Influenza, Seasonal, Injecte, Preservative Fre 01/19/2014  . Influenza-Unspecified 01/19/2015  . OPV 10/19/1995  . Pneumococcal Conjugate-13 02/09/2015  . Pneumococcal Polysaccharide-23 07/06/2001  . Td 11/13/1978, 01/04/1989, 10/19/1995, 06/08/2009  . Tdap 08/26/2010  . Typhoid Inactivated 10/09/1983, 11/01/1989, 11/11/2002  . Typhoid Live 12/09/1995, 06/08/2009  . Yellow Fever 08/26/2010  . Zoster 09/12/2006  . Zoster Recombinat (Shingrix) 09/15/2016, 12/20/2016   Last Pap smear: had hysterectomy Last mammogram: July 2019 Last colonoscopy: doesn't need them anymore per GI Last DEXA: July 2019 and osteopenia without any significant change from previous one Dentist: Dr. Lenore Cordia Ophtho: Dr. Katy Fitch Exercise: every day. Walks 3 days per week in the woods with someone and her phone. Goes to her gym other days.   Other doctors caring for patient include: Dr. Caryl Comes- Cardiology Dr. Raquel James- GI Dr. Katy Fitch-  Eyes Dr. Romeo Rabon- audiologist  Dr. Lenore Cordia- Dentist Dr. Jari Pigg- dermatolgist   Depression screen:  See questionnaire below.  Depression screen [facility] 2/9 01/03/2018 09/14/2016  09/09/2015  Decreased Interest 0 0 0  Down, Depressed, Hopeless 0 0 0  PHQ - 2 Score 0 0 0    Fall Risk Screen: see questionnaire below. Fall Risk  01/03/2018 09/14/2016 09/09/2015  Falls in the past year? No No No    ADL screen:  See questionnaire below Functional Status Survey: Is the patient deaf or have difficulty hearing?: No Does the patient have difficulty seeing, even when wearing glasses/contacts?: No Does the patient have difficulty concentrating, remembering, or making decisions?: No Does the patient have difficulty walking or climbing stairs?: No Does the patient have difficulty dressing or bathing?: No Does the patient have difficulty doing errands alone such as visiting a doctor's office or shopping?: No   End of Life Discussion:  Patient has a living will and medical power of attorney on file. MOST form reviewed and signed with patient. Her daughter is her HCPOA.   Review of Systems Constitutional: -fever, -chills, -sweats, -unexpected weight change, -anorexia, -fatigue Allergy: -sneezing, -itching, -congestion Dermatology: denies changing moles, rash, lumps, new worrisome lesions ENT: -runny nose, -ear pain, -sore throat, -hoarseness, -sinus pain, -teeth pain, -tinnitus, -hearing loss, -epistaxis Cardiology:  -chest pain, -palpitations, -edema, -orthopnea, -paroxysmal nocturnal dyspnea Respiratory: -cough, -shortness of breath, -dyspnea on exertion, -wheezing, -hemoptysis Gastroenterology: -abdominal pain, -nausea, -vomiting, -diarrhea, + intermittent constipation, -blood in stool, -changes in bowel movement, -dysphagia Hematology: -bleeding, bruises easily on Eliquis  Musculoskeletal: -arthralgias, -myalgias, -joint swelling, -back pain, -neck pain, -cramping, -gait changes Ophthalmology: -vision changes, -eye redness, -itching, -discharge Urology: -dysuria, -difficulty urinating, -hematuria, -urinary frequency, -urgency, incontinence Neurology: -headache, -weakness,  -tingling, -numbness, -speech abnormality, -memory loss, -falls, -dizziness Psychology:  -depressed mood, -agitation, -sleep problems  PHYSICAL EXAM:  BP 110/70   Pulse (!) 55   Ht 5' 4.5" (1.638 m)   Wt 125 lb (56.7 kg)   BMI 21.12 kg/m   General Appearance: Alert, cooperative, no distress, appears stated age Head: Normocephalic, without obvious abnormality, atraumatic Eyes: PERRL, conjunctiva/corneas clear, EOM's intact, fundi benign Ears: Normal TM's and external ear canals Nose: Nares normal, mucosa normal, no drainage or sinus tenderness Throat: Lips, mucosa, and tongue normal; teeth and gums- some recession noted Neck: Supple, no lymphadenopathy; thyroid: no enlargement/tenderness/nodules; no carotid bruit or JVD Back: Spine nontender, no curvature, ROM normal, no CVA tenderness Lungs: Clear to auscultation bilaterally without wheezes, rales or ronchi; respirations unlabored Chest Wall: No tenderness or deformity Heart: Regular rate and rhythm, S1 and S2 normal, no murmur, rub or gallop Breast Exam: declines. Left mastectomy. No axillary lymphadenopathy Abdomen: Soft, non-tender, nondistended, normoactive bowel sounds, no masses, no hepatosplenomegaly Genitalia: declines. Pap not indicated.  Extremities: No clubbing, cyanosis or edema Pulses: 2+ and symmetric all extremities Skin: Skin color, texture, turgor normal, no rashes or lesions. Bruising on right forearm with skin intact.  Lymph nodes: Cervical, supraclavicular, and axillary nodes normal Neurologic: CNII-XII intact, normal strength, sensation and gait; reflexes 2+ and symmetric throughout Psych: Normal mood, affect, hygiene and grooming.  ASSESSMENT/PLAN: Medicare annual wellness visit, subsequent  Essential hypertension - Plan: CBC with Differential/Platelet, Comprehensive metabolic panel  Hypothyroidism, unspecified type - Plan: T4, free, TSH  Dyslipidemia  Routine general medical examination at a health  care facility - Plan: POCT Urinalysis DIP (Proadvantage Device), CBC with Differential/Platelet, Comprehensive metabolic panel  Medication management - Plan: T4, free, TSH, VITAMIN D 25 Hydroxy (Vit-D Deficiency, Fractures)  Vitamin D deficiency - Plan: VITAMIN D 25 Hydroxy (Vit-D Deficiency, Fractures)  Pleasant 84 year old female who appears to be functioning quite well mentally and physically. Blood pressure is in goal range.  She will continue on current medication and check her blood pressure periodically. Hypothyroidism-continue on current medication and dose.  Check thyroid panel. Dyslipidemia-reviewed lipid panel from April 2019.  No statin.  Continue with healthy diet and exercise. Continue on Eliquis.  No sign of bleeding Vitamin D-check vitamin D level and continue on supplement. Osteopenia with no significant changes noted on her recent DEXA Immunizations are up-to-date Advanced directives are on file and her daughter is her [facility] POA.  We reviewed her MOST form today and will keep on file.  She plans to schedule with her orthopedist in regards to feeling as though she may need an insert in her left shoe.  States she has seen him before for this Screening done for neuropathy.  Normal foot exam Check labs and follow-up in 6 months or as needed   Discussed monthly self breast exams and yearly mammograms; at least 30 minutes of aerobic activity at least 5 days/week and weight-bearing exercise 2x/week; proper sunscreen use reviewed; healthy diet, including goals of calcium and vitamin D intake and alcohol recommendations (less than or equal to 1 drink/day) reviewed; regular seatbelt use; changing batteries in smoke detectors.  Immunization recommendations discussed.  Colonoscopy recommendations reviewed   Medicare Attestation I have personally reviewed: The patient's medical and social history Their use of alcohol, tobacco or illicit drugs Their current medications and  supplements The patient's functional ability including ADLs,fall risks, home safety risks, cognitive, and hearing and visual impairment Diet and physical activities Evidence for depression or mood disorders  The patient's weight, height, and BMI have been recorded in the chart.  I have  made referrals, counseling, and provided education to the patient based on review of the above and I have provided the patient with a written personalized care plan for preventive services.     Harland Dingwall, NP-C   01/03/2018

## 2018-01-03 ENCOUNTER — Encounter: Payer: Self-pay | Admitting: Family Medicine

## 2018-01-03 ENCOUNTER — Ambulatory Visit: Payer: Medicare Other | Admitting: Family Medicine

## 2018-01-03 VITALS — BP 110/70 | HR 55 | Ht 64.5 in | Wt 125.0 lb

## 2018-01-03 DIAGNOSIS — E785 Hyperlipidemia, unspecified: Secondary | ICD-10-CM | POA: Diagnosis not present

## 2018-01-03 DIAGNOSIS — Z79899 Other long term (current) drug therapy: Secondary | ICD-10-CM | POA: Diagnosis not present

## 2018-01-03 DIAGNOSIS — E039 Hypothyroidism, unspecified: Secondary | ICD-10-CM

## 2018-01-03 DIAGNOSIS — Z0000 Encounter for general adult medical examination without abnormal findings: Secondary | ICD-10-CM

## 2018-01-03 DIAGNOSIS — I10 Essential (primary) hypertension: Secondary | ICD-10-CM | POA: Diagnosis not present

## 2018-01-03 DIAGNOSIS — E559 Vitamin D deficiency, unspecified: Secondary | ICD-10-CM

## 2018-01-03 LAB — POCT URINALYSIS DIP (PROADVANTAGE DEVICE)
BILIRUBIN UA: NEGATIVE mg/dL
Bilirubin, UA: NEGATIVE
Blood, UA: NEGATIVE
Glucose, UA: NEGATIVE mg/dL
LEUKOCYTES UA: NEGATIVE
Nitrite, UA: NEGATIVE
PROTEIN UA: NEGATIVE mg/dL
Specific Gravity, Urine: 1.01
Urobilinogen, Ur: NEGATIVE
pH, UA: 6.5 (ref 5.0–8.0)

## 2018-01-03 NOTE — Patient Instructions (Signed)
MEDICARE PREVENTATIVE SERVICES (FEMALE) AND PERSONALIZED PLAN for [name] January 03, 2018   SPECIFIC RECOMMENDATIONS: Continue taking good care of yourself.  Continue on current medication regimen.   Your appear to be up to date on all immunizations.   Return in 6 months or pending lab results.    GENERAL RECOMMENDATIONS FOR GOOD HEALTH:  Supplements:  . Take a daily baby Aspirin 81mg  at bedtime for heart health unless you have a history of gastrointestinal bleed, allergy to aspirin, or are already taking higher dose Aspirin or other antiplatelet or blood thinner medication.   . Consume 1200 mg of Calcium daily through dietary calcium or supplement if you are female age 21 or older, or men 24 and older.   Men aged 35-70 should consume 1000 mg of Calcium daily. . Take 600 IU of Vitamin D daily.  Take 800 IU of Calcium daily if you are older than age 35.  . Take a general multivitamin daily.   Healthy diet: Eat a variety of foods, including fruits, vegetables, vegetable protein such as beans, lentils, tofu, and grains, such as rice.  Limit meat or animal protein, but if you eat meat, choose leans cuts such as chicken, fish, or [place].  Drink plenty of water daily.  Decrease saturated fat in the diet, avoid lots of red meat, processed foods, sweets, fast foods, and fried foods.  Limit salt and caffeine intake.  Exercise: Aerobic exercise helps maintain good heart health. Weight bearing exercise helps keep bones and muscles working strong.  We recommend at least 30-40 minutes of exercise most days of the week.   Fall prevention: Falls are the leading cause of injuries, accidents, and accidental deaths in people over the age of 23. Falling is a real threat to your ability to live on your own.  Causes include poor eyesight or poor hearing, illness, poor lighting, throw rugs, clutter in your home, and medication side effects causing dizziness or balance problems.  Such medications can  include medications for depression, sleep problems, high blood pressure, diabetes, and heart conditions.   PREVENTION  Be sure your home is as safe as possible. Here are some tips:  Wear shoes with non-skid soles (not house slippers).   Be sure your home and outside area are well lit.   Use night lights throughout your house, including hallways and stairways.   Remove clutter and clean up spills on floors and walkways.   Remove throw rugs or fasten them to the floor with carpet tape. Tack down carpet edges.   Do not place electrical cords across pathways.   Install grab bars in your bathtub, shower, and toilet area. Towel bars should not be used as a grab bar.   Install handrails on both sides of stairways.   Do not climb on stools or stepladders. Get someone else to help with jobs that require climbing.   Do not wax your floors at all, or use a non-skid wax.   Repair uneven or unsafe sidewalks, walkways or stairs.   Keep frequently used items within reach.   Be aware of pets so you do not trip.  Get regular check-ups from your doctor, and take good care of yourself:  Have your eyes checked every year for vision changes, cataracts, glaucoma, and other eye problems. Wear eyeglasses as directed.   Have your hearing checked every 2 years, or anytime you or others think that you cannot hear well. Use hearing aids as directed.   See your caregiver  if you have foot pain or corns. Sore feet can contribute to falls.   Let your caregiver know if a medicine is making you feel dizzy or making you lose your balance.   Use a cane, walker, or wheelchair as directed. Use walker or wheelchair brakes when getting in and out.   When you get up from bed, sit on the side of the bed for 1 to 2 minutes before you stand up. This will give your blood pressure time to adjust, and you will feel less dizzy.   If you need to go to the bathroom often, consider using a bedside commode.  Disease  prevention:  If you smoke or chew tobacco, find out from your caregiver how to quit. It can literally save your life, no matter how long you have been a tobacco user. If you do not use tobacco, never begin. Medicare does cover some smoking cessation counseling.  Maintain a healthy diet and normal weight. Increased weight leads to problems with blood pressure and diabetes. We check your height, weight, and BMI as part of your yearly visit.  The Body Mass Index or BMI is a way of measuring how much of your body is fat. Having a BMI above 27 increases the risk of heart disease, diabetes, hypertension, stroke and other problems related to obesity. Your caregiver can help determine your BMI and based on it develop an exercise and dietary program to help you achieve or maintain this important measurement at a healthful level.  High blood pressure causes heart and blood vessel problems.  Persistent high blood pressure should be treated with medicine if weight loss and exercise do not work.  We check your blood pressure as part of your yearly visit.  Avoid drinking alcohol in excess (more than two drinks per day).  Avoid use of street drugs. Do not share needles with anyone. Ask for professional help if you need assistance or instructions on stopping the use of alcohol, cigarettes, and/or drugs.  Brush your teeth twice a day with fluoride toothpaste, and floss once a day. Good oral hygiene prevents tooth decay and gum disease. The problems can be painful, unattractive, and can cause other health problems. Visit your dentist for a routine oral and dental checkup and preventive care every 6-12 months.   See your eye doctor yearly for routine screening for things like glaucoma.  Look at your skin regularly.  Use a mirror to look at your back. Notify your caregivers of changes in moles, especially if there are changes in shapes, colors, a size larger than a pencil eraser, an irregular border, or development of  new moles.  Safety:  Use seatbelts 100% of the time, whether driving or as a passenger.  Use safety devices such as hearing protection if you work in environments with loud noise or significant background noise.  Use safety glasses when doing any work that could send debris in to the eyes.  Use a helmet if you ride a bike or motorcycle.  Use appropriate safety gear for contact sports.  Talk to your caregiver about gun safety.  Use sunscreen with a SPF (or skin protection factor) of 15 or greater.  Lighter skinned people are at a greater risk of skin cancer. Don't forget to also wear sunglasses in order to protect your eyes from too much damaging sunlight. Damaging sunlight can accelerate cataract formation.   If you have multiple sexual partners, or if you are not in a monogamous relationship, practice safe sex.  Use condoms. Condoms are used to help reduce the spread of sexually transmitted infections (or STIs).  Consider an HIV test if you have never been tested.  Consider routine screening for STIs if you have multiple sexual partners.   Keep carbon monoxide and smoke detectors in your home functioning at all times. Change the batteries every 6 months or use a model that plugs into the wall or is hard wired in.   END OF LIFE PLANNING/ADVANCED DIRECTIVES Advance health-care planning is deciding the kind of care you want at the end of life. While alert competent adults are able to exercise their rights to make health care and financial decisions, problems arise when an individual becomes unconscious, incapacitated, or otherwise unable to communicate or make such decisions. Advance health care directives are the legal documents in which you give written instructions about your choices limited, aggressive or palliative care if, in the future, you cannot speak for yourself.  Advanced directives include the following: [place] allows you to appoint someone to act as your health care  agent to make health care decisions for you should it be determined by your health care provider that you are no longer able to make these decisions for yourself.  A Living Will is a legal document in which you can declare that under certain conditions you desire your life not be prolonged by extraordinary or artificial means during your last illness or when you are near death. We can provide you with sample advanced directives, you can get an attorney to prepare these for you, or you can visit [place] Secretary of State's website for additional information and resources at [email or web address]  Further, I recommend you have an attorney prepare a Will and Durable Power of Attorney if you haven't done so already.  Please get [place] a copy of your health care Advanced Directives.   PREVENTATIV E CARE RECOMMENDATIONS:  Vaccinations: We recommend the following vaccinations as part of your preventative care:  Pneumococcal vaccine is recommended to protect against certain types of pneumonia.  This is normally recommended for adults age 60 or older once, or up to every 5 years for those at high risk.  The vaccine is also recommended for adults younger than 82 years old with certain underlying conditions that make them high risk for pneumonia.  Influenza vaccine is recommended to protect against seasonal influenza or "the flu." Influenza is a serious disease that can lead to hospitalization and sometimes even death. Traditional flu vaccines (called trivalent vaccines) are made to protect against three flu viruses; an influenza A (H1N1) virus, an influenza A (H3N2) virus, and an influenza B virus. In addition, there are flu vaccines made to protect against four flu viruses (called "quadrivalent" vaccines). These vaccines protect against the same viruses as the trivalent vaccine and an additional B virus.  We recommend the high dose influenza vaccine to those 65 years and older.  Hepatitis B vaccine  to protect against a form of infection of the liver by a virus acquired from blood or body fluids, particularly for high risk groups.  Td or Tdap vaccine to protect against Tetanus, diphtheria and pertussis which can be very serious.  These diseases are caused by bacteria.  Diphtheria and pertussis are spread from person to person through coughing or sneezing.  Tetanus enters the body through cuts, scratches, or wounds.  Tetanus (Lockjaw) causes painful muscle tightening and stiffness, usually all over the body.  Diphtheria can cause a thick coating  to form in the back of the throat.  It can lead to breathing problems, paralysis, heart failure, and death.  Pertussis (Whooping Cough) causes severe coughing spells, which can cause difficulty breathing, vomiting and disturbed sleep.  Td or Tdap is usually given every 10 years.  Shingles vaccine to protect against Varicella Zoster if you are older than age 28, or younger than 82 years old with certain underlying illness.    Cancer Screening: Most routine colon cancer screening begins at the age of 42.  Subsequent colonoscopies are performed either every 5-10 years for normal screening, or every 2-5 years for higher risks patients, up until age 54 years of age. Annual screening is done with easy to use take-home tests to check for hidden blood in the stool called hemoccult tests.  Sigmoidoscopy or colonoscopy can detect the earliest forms of colon cancer and is life saving. These tests use a small camera at the end of a tube to directly examine the colon.   Pelvic Exam and Pap Smear: Pelvic exams and pap smears are performed routinely to evaluate for abnormalities as well as cancers including cervical and vaginal cancers.  This is generally performed every 2-3 years for most women, or more frequently for higher risk patients.  Mammograms: Mammograms are used to screen for breast cancer.  Medicare covers baseline screening once from ages 12-38 years old, but  will cover mammograms yearly for those 40 years and older.  In accordance with other guidelines, you may not need a mammogram every year though.  The decision on how frequently you need a mammogram should be discussed with you medical provider.    Osteoporosis Screening: Screening for osteoporosis usually begins at age 79 for women, and can be done as frequent as every 2 years.  However, women or men with higher risk of osteoporosis may be screened earlier than age 86.  Osteoporosis or low bone mass is diminished bone strength from alterations in bone architecture leading to bone fragility and increased fracture risk.     Cardiovascular Screening: Fat and cholesterol leaves deposits in your arteries that can block them. This causes heart disease and vessel disease elsewhere in your body.  If your cholesterol is found to be high, or if you have heart disease or certain other medical conditions, then you may need to have your cholesterol monitored frequently and be treated with medication. Cardiovascular screening in the form of lab tests for cholesterol, HDL and triglycerides can be done every 5 years.  A screening electrocardiogram can be done as part of the Welcome to Medicare physical.  Diabetes Screening: Diabetes screening can be done at least every 3 years for those with risk factors,  or every 6-78months for prediabetic patients.  Screening includes fasting blood sugar test or glucose tolerance test.  Risk factors include hypertension, dyslipidemia, obesity, previously abnormal glucose tests, family history of diabetes, age 60 years or older, and history of gestations diabetes.   AAA (abdominal aortic aneurysm) Screening: Medicare allows for a one time ultrasound to screen for abdominal aortic aneurysm if done as a referral as part of the Welcome to Medicare exam.  Men eligible for this screening include those men between age 22-18 years of age who have smoked at least 100 cigarettes in his  lifetime and/or has a family history of AAA.  HIV Screening:  Medicare allows for yearly screening for patients at high risk for contracting HIV disease.

## 2018-01-04 LAB — CBC WITH DIFFERENTIAL/PLATELET
BASOS ABS: 0.1 10*3/uL (ref 0.0–0.2)
Basos: 1 %
EOS (ABSOLUTE): 0.1 10*3/uL (ref 0.0–0.4)
Eos: 2 %
HEMOGLOBIN: 12.4 g/dL (ref 11.1–15.9)
Hematocrit: 37.3 % (ref 34.0–46.6)
IMMATURE GRANS (ABS): 0 10*3/uL (ref 0.0–0.1)
Immature Granulocytes: 0 %
LYMPHS: 27 %
Lymphocytes Absolute: 1.8 10*3/uL (ref 0.7–3.1)
MCH: 31 pg (ref 26.6–33.0)
MCHC: 33.2 g/dL (ref 31.5–35.7)
MCV: 93 fL (ref 79–97)
MONOCYTES: 9 %
Monocytes Absolute: 0.6 10*3/uL (ref 0.1–0.9)
NEUTROS ABS: 4 10*3/uL (ref 1.4–7.0)
Neutrophils: 61 %
Platelets: 261 10*3/uL (ref 150–450)
RBC: 4 x10E6/uL (ref 3.77–5.28)
RDW: 15.9 % — ABNORMAL HIGH (ref 12.3–15.4)
WBC: 6.6 10*3/uL (ref 3.4–10.8)

## 2018-01-04 LAB — COMPREHENSIVE METABOLIC PANEL
A/G RATIO: 2 (ref 1.2–2.2)
ALBUMIN: 4.5 g/dL (ref 3.5–4.7)
ALK PHOS: 68 IU/L (ref 39–117)
ALT: 16 IU/L (ref 0–32)
AST: 18 IU/L (ref 0–40)
BILIRUBIN TOTAL: 0.3 mg/dL (ref 0.0–1.2)
BUN / CREAT RATIO: 22 (ref 12–28)
BUN: 15 mg/dL (ref 8–27)
CHLORIDE: 99 mmol/L (ref 96–106)
CO2: 24 mmol/L (ref 20–29)
Calcium: 9.8 mg/dL (ref 8.7–10.3)
Creatinine, Ser: 0.69 mg/dL (ref 0.57–1.00)
GFR calc Af Amer: 92 mL/min/{1.73_m2} (ref >59)
GFR calc non Af Amer: 80 mL/min/{1.73_m2} (ref >59)
Globulin, Total: 2.3 g/dL (ref 1.5–4.5)
Glucose: 88 mg/dL (ref 65–99)
POTASSIUM: 5.4 mmol/L — AB (ref 3.5–5.2)
SODIUM: 138 mmol/L (ref 134–144)
Total Protein: 6.8 g/dL (ref 6.0–8.5)

## 2018-01-04 LAB — TSH: TSH: 1.87 u[IU]/mL (ref 0.450–4.500)

## 2018-01-04 LAB — VITAMIN D 25 HYDROXY (VIT D DEFICIENCY, FRACTURES): VIT D 25 HYDROXY: 42.7 ng/mL (ref 30.0–100.0)

## 2018-01-04 LAB — T4, FREE: FREE T4: 1.37 ng/dL (ref 0.82–1.77)

## 2018-01-08 ENCOUNTER — Other Ambulatory Visit: Payer: Self-pay | Admitting: Family Medicine

## 2018-01-08 DIAGNOSIS — E875 Hyperkalemia: Secondary | ICD-10-CM

## 2018-01-09 ENCOUNTER — Encounter: Payer: Self-pay | Admitting: Family Medicine

## 2018-01-15 ENCOUNTER — Other Ambulatory Visit: Payer: Medicare Other

## 2018-01-15 DIAGNOSIS — E875 Hyperkalemia: Secondary | ICD-10-CM

## 2018-01-15 LAB — BASIC METABOLIC PANEL
BUN/Creatinine Ratio: 17 (ref 12–28)
BUN: 13 mg/dL (ref 8–27)
CO2: 26 mmol/L (ref 20–29)
CREATININE: 0.78 mg/dL (ref 0.57–1.00)
Calcium: 9.7 mg/dL (ref 8.7–10.3)
Chloride: 101 mmol/L (ref 96–106)
GFR calc Af Amer: 81 mL/min/{1.73_m2} (ref >59)
GFR, EST NON AFRICAN AMERICAN: 70 mL/min/{1.73_m2} (ref >59)
Glucose: 71 mg/dL (ref 65–99)
Potassium: 5.5 mmol/L — ABNORMAL HIGH (ref 3.5–5.2)
SODIUM: 138 mmol/L (ref 134–144)

## 2018-01-17 ENCOUNTER — Encounter: Payer: Self-pay | Admitting: Family Medicine

## 2018-01-24 ENCOUNTER — Encounter: Payer: Self-pay | Admitting: Family Medicine

## 2018-01-30 ENCOUNTER — Encounter: Payer: Self-pay | Admitting: Family Medicine

## 2018-01-30 ENCOUNTER — Ambulatory Visit: Payer: Medicare Other | Admitting: Family Medicine

## 2018-01-30 VITALS — BP 120/78 | HR 52 | Temp 97.7°F | Wt 125.6 lb

## 2018-01-30 DIAGNOSIS — W57XXXA Bitten or stung by nonvenomous insect and other nonvenomous arthropods, initial encounter: Secondary | ICD-10-CM

## 2018-01-30 DIAGNOSIS — L239 Allergic contact dermatitis, unspecified cause: Secondary | ICD-10-CM

## 2018-01-30 MED ORDER — TRIAMCINOLONE ACETONIDE 0.1 % EX CREA: 1.0000 "application " | TOPICAL_CREAM | Freq: Two times a day (BID) | CUTANEOUS | 0 refills | Status: DC

## 2018-01-30 MED ORDER — LORATADINE 10 MG PO TABS: 10.0000 mg | ORAL_TABLET | Freq: Every day | ORAL | 1 refills | Status: DC

## 2018-01-30 NOTE — Progress Notes (Signed)
   Subjective:    Patient ID: [name], female    DOB: [birth year], 84 y.o.   MRN: [id number]  HPI Chief Complaint  Patient presents with  . chigger    chiggers- last thursday. all over body- itching,   She is here with complaints of all over itching since working in her garden last week. States she came in contact with chiggers and has had similar reaction before from chiggers.  She has used hydrocortisone without much relief.   Denies fever, chills, shortness of breath, nausea, vomiting.   Reviewed allergies, medications, past medical, surgical, family, and social history.   Review of Systems Pertinent positives and negatives in the history of present illness.     Objective:   Physical Exam BP 120/78   Pulse (!) 52   Temp 97.7 F (36.5 C) (Oral)   Wt 125 lb 9.6 oz (57 kg)   SpO2 98%   BMI 21.23 kg/m   Alert and oriented and in no acute distress. Normal pharyngeal exam.  Diffuse pruritic red, raised rash to chest, abdomen, back, buttocks, and lower extremities. Some grouped papules to her inner thigh and waist. No surrounding erythema, drainage or sign of infection.       Assessment & Plan:  Allergic dermatitis - Plan: loratadine (CLARITIN) 10 MG tablet, triamcinolone cream (KENALOG) 0.1 %  Insect bite, nonvenomous  Will treat with oral non drowsy antihistamine and topical steroid. Also advised to use cool compresses and cool baths/showers for relief. No sign of infection. She will call if not improving and consider oral steroid.

## 2018-01-30 NOTE — Patient Instructions (Signed)
Use the Kenalog steroid cream and take a non-drowsy antihistamine such as Claritin, Allegra or Xyzal. You may also take the generic of one of these.  Take cool baths or showers, use cool compresses for itching.   Call tomorrow if you are not improving.

## 2018-01-30 NOTE — Addendum Note (Signed)
Addended by: Minette Headland A on: 01/30/2018 05:00 PM   Modules accepted: Orders

## 2018-01-31 ENCOUNTER — Encounter: Payer: Self-pay | Admitting: Family Medicine

## 2018-02-09 ENCOUNTER — Encounter: Payer: Self-pay | Admitting: Family Medicine

## 2018-02-09 ENCOUNTER — Other Ambulatory Visit: Payer: Self-pay | Admitting: Family Medicine

## 2018-02-11 MED ORDER — AMLODIPINE BESYLATE 5 MG PO TABS: 5.0000 mg | ORAL_TABLET | Freq: Every morning | ORAL | 2 refills | Status: DC

## 2018-02-18 ENCOUNTER — Encounter: Payer: Self-pay | Admitting: Family Medicine

## 2018-03-03 ENCOUNTER — Encounter: Payer: Self-pay | Admitting: Family Medicine

## 2018-03-07 ENCOUNTER — Encounter: Payer: Self-pay | Admitting: Family Medicine

## 2018-03-07 ENCOUNTER — Ambulatory Visit: Payer: Medicare Other | Admitting: Family Medicine

## 2018-03-07 VITALS — Wt 128.0 lb

## 2018-03-07 DIAGNOSIS — E875 Hyperkalemia: Secondary | ICD-10-CM | POA: Diagnosis not present

## 2018-03-07 DIAGNOSIS — M6289 Other specified disorders of muscle: Secondary | ICD-10-CM

## 2018-03-07 DIAGNOSIS — I10 Essential (primary) hypertension: Secondary | ICD-10-CM

## 2018-03-07 DIAGNOSIS — K5900 Constipation, unspecified: Secondary | ICD-10-CM | POA: Diagnosis not present

## 2018-03-07 HISTORY — DX: Hyperkalemia: E87.5

## 2018-03-07 MED ORDER — LINACLOTIDE 72 MCG PO CAPS: 72.0000 ug | ORAL_CAPSULE | Freq: Every day | ORAL | 0 refills | Status: DC

## 2018-03-07 NOTE — Patient Instructions (Addendum)
You may cut the amlodipine tablet in half and take 2.5 mg once daily.  Please keep an eye on your blood pressure and if you are seeing readings higher than 130/80 then let me know and start back on the full dose.  Try using topical Biofreeze or Arnicare gel, or some sort of menthol topical medication for quadricep muscle stiffness  If Dr. Hilarie Fredrickson is in favor of the Linzess, take this once daily 30 minutes before breakfast with a full glass of water.    Hyperkalemia Hyperkalemia is when you have too much potassium in your blood. Potassium is normally removed (excreted) from your body by your kidneys. If there is too much potassium in your blood, it can affect how your heart works. Follow these instructions at home:  Take medicines only as told by your doctor.  Do not take any supplements, natural products, herbs, or vitamins unless your doctor says it is okay.  Limit your alcohol intake as told by your doctor.  Stop illegal drug use. If you need help quitting, ask your doctor.  Keep all follow-up visits as told by your doctor. This is important.  If you have kidney disease, you may need to follow a low potassium diet. A food specialist (dietitian) can help you. Contact a doctor if:  Your heartbeat is not regular or very slow.  You feel dizzy (light-headed).  You feel weak.  You feel sick to your stomach (nauseous).  You have tingling in your hands or feet.  You cannot feel your hands or feet. Get help right away if:  You are short of breath.  You have chest pain.  You pass out (faint).  You cannot move your muscles. This information is not intended to replace advice given to you by your health care provider. Make sure you discuss any questions you have with your health care provider. Document Released: 04/24/2005 Document Revised: 09/30/2015 Document Reviewed: 07/30/2013 Elsevier Interactive Patient Education  2018 [employer].

## 2018-03-07 NOTE — Progress Notes (Signed)
   Subjective:    Patient ID: [name], female    DOB: 05-08-1932, 84 y.o.   MRN: [id number]  HPI Chief Complaint  Patient presents with  . follow-up    follow-up on bp and potassium   She is here to follow up on hyperkalemia.  She is also questioning whether she can cut back on amlodipine dose. States she would eventually like to come off as many medications as possible.  States her BP at home is in the 120s/60s. She is also taking irbesartan and doing fine on this.   Concerns regarding her quadriceps feeling "stiff". States it takes her a while to get moving after sitting but she is able to perform her ADLs and exercise.  States her joints feel fine.   Complains of chronic constipation. She has seen Dr. Hilarie Fredrickson in the past. Has tried benefiber and Miralax and did not like these. Is currently taking a stool softener but not noticing improvement. Would like to know what else she can do. Reports eating a high fiber diet, staying well hydrated and is physically active. No blood in her stool.  Denies fever, chills, chest pain, abdominal pain, N/V.   Reviewed allergies, medications, past medical, surgical, family, and social history.   Review of Systems Pertinent positives and negatives in the history of present illness.     Objective:   Physical Exam Wt 128 lb (58.1 kg)   SpO2 98%   BMI 21.63 kg/m   Blood pressure 118/72.  Pulse 48. Alert and oriented and in no acute distress. Bilateral lower extremities are neurovascularly intact, no asymmetry. Non tender quadriceps and calves. Skin is warm and dry, no pallor or rash. Normal gait.      Assessment & Plan:  Essential hypertension - Plan: Basic metabolic panel  Hyperkalemia - Plan: Basic metabolic panel  Constipation, unspecified constipation type - Plan: linaclotide (LINZESS) 72 MCG capsule  Muscle stiffness - Plan: CK, VITAMIN D 25 Hydroxy (Vit-D Deficiency, Fractures)  She may cut back amlodipine to half tablet and  keep a close eye on her BP. She is aware that if BP is no longer at goal then she will need to go back on the whole tablet again.  Hyperkalemia- recheck potassium. She does not appear to be taking any medications to cause this. Diet is high in potassium rich foods.  Constipation- samples of Linzess 72 mcg given for her to try. #8 capsules given. She will check with Dr. Hilarie Fredrickson as well.  quadricep muscle aches- check CK. She may try topical analgesic such as Biofreeze, Arnicare gel or equivalent. Offered PT eval and she declines. She may follow up with her orthopedist as well if needed.  Follow up pending labs.

## 2018-03-08 ENCOUNTER — Other Ambulatory Visit: Payer: Self-pay

## 2018-03-08 DIAGNOSIS — K5900 Constipation, unspecified: Secondary | ICD-10-CM

## 2018-03-08 LAB — VITAMIN D 25 HYDROXY (VIT D DEFICIENCY, FRACTURES): VIT D 25 HYDROXY: 38.3 ng/mL (ref 30.0–100.0)

## 2018-03-08 LAB — CK: Total CK: 127 U/L (ref 24–173)

## 2018-03-08 LAB — BASIC METABOLIC PANEL
BUN / CREAT RATIO: 18 (ref 12–28)
BUN: 14 mg/dL (ref 8–27)
CHLORIDE: 104 mmol/L (ref 96–106)
CO2: 20 mmol/L (ref 20–29)
Calcium: 9.6 mg/dL (ref 8.7–10.3)
Creatinine, Ser: 0.79 mg/dL (ref 0.57–1.00)
GFR calc non Af Amer: 69 mL/min/{1.73_m2} (ref >59)
GFR, EST AFRICAN AMERICAN: 79 mL/min/{1.73_m2} (ref >59)
Glucose: 90 mg/dL (ref 65–99)
POTASSIUM: 5.5 mmol/L — AB (ref 3.5–5.2)
Sodium: 141 mmol/L (ref 134–144)

## 2018-03-08 MED ORDER — LINACLOTIDE 72 MCG PO CAPS: 72.0000 ug | ORAL_CAPSULE | Freq: Every day | ORAL | 3 refills | Status: DC

## 2018-03-08 NOTE — Telephone Encounter (Signed)
Linzess is ok to use, and can be prescribed for ongoing use Please verify which dose she was given RX that dose Best to take daily, 30 min before eating

## 2018-03-09 ENCOUNTER — Encounter: Payer: Self-pay | Admitting: Family Medicine

## 2018-03-11 ENCOUNTER — Encounter: Payer: Self-pay | Admitting: Family Medicine

## 2018-03-11 MED ORDER — IRBESARTAN 75 MG PO TABS: 75.0000 mg | ORAL_TABLET | Freq: Every day | ORAL | 1 refills | Status: DC

## 2018-04-30 ENCOUNTER — Encounter: Payer: Self-pay | Admitting: Family Medicine

## 2018-05-02 MED ORDER — AMLODIPINE BESYLATE 5 MG PO TABS: 5.0000 mg | ORAL_TABLET | Freq: Every morning | ORAL | 1 refills | Status: DC

## 2018-05-23 ENCOUNTER — Other Ambulatory Visit: Payer: Self-pay | Admitting: Family Medicine

## 2018-05-24 NOTE — Telephone Encounter (Signed)
Is this okay to refill? 

## 2018-05-29 ENCOUNTER — Encounter: Payer: Self-pay | Admitting: Family Medicine

## 2018-05-30 MED ORDER — LISINOPRIL 10 MG PO TABS: 10.0000 mg | ORAL_TABLET | Freq: Every day | ORAL | 0 refills | Status: DC

## 2018-07-09 ENCOUNTER — Ambulatory Visit: Payer: Medicare Other | Admitting: Family Medicine

## 2018-07-11 ENCOUNTER — Ambulatory Visit: Payer: Medicare Other | Admitting: Family Medicine

## 2018-07-11 ENCOUNTER — Encounter: Payer: Self-pay | Admitting: Family Medicine

## 2018-07-11 VITALS — BP 122/80 | HR 46 | Wt 127.4 lb

## 2018-07-11 DIAGNOSIS — I10 Essential (primary) hypertension: Secondary | ICD-10-CM | POA: Diagnosis not present

## 2018-07-11 DIAGNOSIS — M6281 Muscle weakness (generalized): Secondary | ICD-10-CM | POA: Insufficient documentation

## 2018-07-11 DIAGNOSIS — I480 Paroxysmal atrial fibrillation: Secondary | ICD-10-CM

## 2018-07-11 DIAGNOSIS — E875 Hyperkalemia: Secondary | ICD-10-CM

## 2018-07-11 DIAGNOSIS — E785 Hyperlipidemia, unspecified: Secondary | ICD-10-CM

## 2018-07-11 DIAGNOSIS — E039 Hypothyroidism, unspecified: Secondary | ICD-10-CM | POA: Diagnosis not present

## 2018-07-11 DIAGNOSIS — Z79899 Other long term (current) drug therapy: Secondary | ICD-10-CM

## 2018-07-11 DIAGNOSIS — Z7409 Other reduced mobility: Secondary | ICD-10-CM

## 2018-07-11 NOTE — Progress Notes (Signed)
   Subjective:    Patient ID: [name], female    DOB: Jun 03, 1931, 85 y.o.   MRN: [id number]  HPI Chief Complaint  Patient presents with  . 6 month follow-up    6 month follow-up.    She is here for a 52-month medication management visit.  HTN- taking lisinopril 10 mg and amlodipine most days.  Readings have been normal range when she is taking both medications.  Reports having dry cough and itchy throat that may be related to lisinopril.  States she wants to continue on the lisinopril and symptoms are not that bothersome.  Irbesartan caused nausea and sleep issues. These resolved after stopping the medication.   Followed by cardiology for A-fib. Denies chest pain, palpitations, shortness of breath, orthopnea, LE edema.   Complains of persistent muscle stiffness in her quadriceps that seems to be getting worse. She has tried PT in the past and this helped. No longer doing PT.  States she works on her balance at home but has issues with balance.  Decreased energy and endurance with walking and hiking.  Still able to perform daily tasks but not as well as in the past.  Naps during the afternoon.   Taking a stool softener every other days. Bowels are regular now. Is not taking anything else.   Taking levothyroxine daily on empty stomach. No issues.   Would like a visual acuity. Sees Dr. Katy Fitch.   Reviewed allergies, medications, past medical, surgical, family, and social history.    Review of Systems Pertinent positives and negatives in the history of present illness.     Objective:   Physical Exam BP 122/80   Pulse (!) 46   Wt 127 lb 6.4 oz (57.8 kg)   BMI 21.53 kg/m   Alert and oriented and in no distress.  Pharyngeal area is normal. Neck is supple without adenopathy or thyromegaly. Cardiac exam shows a regular  rhythm without murmurs or gallops. Lungs are clear to auscultation. Extremities without edema, pulses intact. Skin is warm and dry, no pallor. PERRLA. CNs  intact. Normal gait.      Assessment & Plan:  Essential hypertension - Plan: CBC with Differential/Platelet, Comprehensive metabolic panel  Paroxysmal atrial fibrillation (HCC)  Hypothyroidism, unspecified type - Plan: TSH, T4, free  Dyslipidemia - Plan: Lipid panel  Hyperkalemia - Plan: Comprehensive metabolic panel  Decreased muscle strength - Plan: Ambulatory referral to Physical Therapy  Impaired functional mobility, balance, gait, and endurance - Plan: Ambulatory referral to Physical Therapy  Medication management - Plan: TSH, T4, free, Lipid panel   Blood pressure is in goal range.  She is currently taking lisinopril 10 mg and amlodipine 5 mg most days.  She does report adjusting her dose occasionally but she does keep an eye on her blood pressure. Discussed that nagging cough could be related to the lisinopril but she would like to continue on this medication.  She will let me know if this becomes more of an issue. Check thyroid function and adjust levothyroxine if needed. She has had persistent hyperkalemia and her diet is high in potassium.  Recheck potassium level. She is followed by her cardiologist for A. fib.  She is in normal sinus rhythm today, bradycardic but this is baseline. PT referral for evaluation of leg strength, balance and endurance. Follow-up pending labs or in 3 months for a Medicare wellness visit.

## 2018-07-12 ENCOUNTER — Other Ambulatory Visit: Payer: Self-pay | Admitting: Family Medicine

## 2018-07-12 ENCOUNTER — Other Ambulatory Visit: Payer: Self-pay | Admitting: Internal Medicine

## 2018-07-12 DIAGNOSIS — I10 Essential (primary) hypertension: Secondary | ICD-10-CM

## 2018-07-12 DIAGNOSIS — E875 Hyperkalemia: Secondary | ICD-10-CM

## 2018-07-12 LAB — COMPREHENSIVE METABOLIC PANEL
A/G RATIO: 1.8 (ref 1.2–2.2)
ALT: 12 IU/L (ref 0–32)
AST: 19 IU/L (ref 0–40)
Albumin: 4.4 g/dL (ref 3.6–4.6)
Alkaline Phosphatase: 79 IU/L (ref 39–117)
BUN/Creatinine Ratio: 22 (ref 12–28)
BUN: 15 mg/dL (ref 8–27)
Bilirubin Total: 0.3 mg/dL (ref 0.0–1.2)
CALCIUM: 9.6 mg/dL (ref 8.7–10.3)
CO2: 25 mmol/L (ref 20–29)
Chloride: 100 mmol/L (ref 96–106)
Creatinine, Ser: 0.69 mg/dL (ref 0.57–1.00)
GFR calc Af Amer: 92 mL/min/{1.73_m2} (ref >59)
GFR, EST NON AFRICAN AMERICAN: 80 mL/min/{1.73_m2} (ref >59)
Globulin, Total: 2.5 g/dL (ref 1.5–4.5)
Glucose: 81 mg/dL (ref 65–99)
POTASSIUM: 6.1 mmol/L — AB (ref 3.5–5.2)
Sodium: 138 mmol/L (ref 134–144)
Total Protein: 6.9 g/dL (ref 6.0–8.5)

## 2018-07-12 LAB — CBC WITH DIFFERENTIAL/PLATELET
BASOS: 1 %
Basophils Absolute: 0.1 10*3/uL (ref 0.0–0.2)
EOS (ABSOLUTE): 0.1 10*3/uL (ref 0.0–0.4)
EOS: 2 %
Hematocrit: 37.2 % (ref 34.0–46.6)
Hemoglobin: 12.4 g/dL (ref 11.1–15.9)
IMMATURE GRANULOCYTES: 0 %
Immature Grans (Abs): 0 10*3/uL (ref 0.0–0.1)
Lymphocytes Absolute: 1.9 10*3/uL (ref 0.7–3.1)
Lymphs: 25 %
MCH: 31 pg (ref 26.6–33.0)
MCHC: 33.3 g/dL (ref 31.5–35.7)
MCV: 93 fL (ref 79–97)
MONOCYTES: 12 %
MONOS ABS: 1 10*3/uL — AB (ref 0.1–0.9)
NEUTROS PCT: 60 %
Neutrophils Absolute: 4.6 10*3/uL (ref 1.4–7.0)
PLATELETS: 272 10*3/uL (ref 150–450)
RBC: 4 x10E6/uL (ref 3.77–5.28)
RDW: 14 % (ref 11.7–15.4)
WBC: 7.7 10*3/uL (ref 3.4–10.8)

## 2018-07-12 LAB — T4, FREE: Free T4: 1.5 ng/dL (ref 0.82–1.77)

## 2018-07-12 LAB — LIPID PANEL
CHOLESTEROL TOTAL: 213 mg/dL — AB (ref 100–199)
Chol/HDL Ratio: 3 ratio (ref 0.0–4.4)
HDL: 71 mg/dL (ref >39)
LDL Calculated: 121 mg/dL — ABNORMAL HIGH (ref 0–99)
Triglycerides: 104 mg/dL (ref 0–149)
VLDL Cholesterol Cal: 21 mg/dL (ref 5–40)

## 2018-07-12 LAB — TSH: TSH: 1.82 u[IU]/mL (ref 0.450–4.500)

## 2018-07-12 MED ORDER — HYDROCHLOROTHIAZIDE 12.5 MG PO CAPS: 12.5000 mg | ORAL_CAPSULE | Freq: Every day | ORAL | 1 refills | Status: DC

## 2018-07-13 ENCOUNTER — Encounter: Payer: Self-pay | Admitting: Family Medicine

## 2018-07-15 ENCOUNTER — Other Ambulatory Visit: Payer: Medicare Other

## 2018-07-15 DIAGNOSIS — E875 Hyperkalemia: Secondary | ICD-10-CM

## 2018-07-15 LAB — BASIC METABOLIC PANEL
BUN / CREAT RATIO: 19 (ref 12–28)
BUN: 14 mg/dL (ref 8–27)
CO2: 30 mmol/L — ABNORMAL HIGH (ref 20–29)
Calcium: 9.1 mg/dL (ref 8.7–10.3)
Chloride: 96 mmol/L (ref 96–106)
Creatinine, Ser: 0.75 mg/dL (ref 0.57–1.00)
GFR calc Af Amer: 84 mL/min/{1.73_m2} (ref >59)
GFR, EST NON AFRICAN AMERICAN: 73 mL/min/{1.73_m2} (ref >59)
Glucose: 78 mg/dL (ref 65–99)
Potassium: 4.8 mmol/L (ref 3.5–5.2)
Sodium: 134 mmol/L (ref 134–144)

## 2018-07-25 ENCOUNTER — Other Ambulatory Visit: Payer: Self-pay | Admitting: Family Medicine

## 2018-08-03 ENCOUNTER — Encounter: Payer: Self-pay | Admitting: Family Medicine

## 2018-08-05 MED ORDER — AMLODIPINE BESYLATE 5 MG PO TABS: 5.0000 mg | ORAL_TABLET | Freq: Every morning | ORAL | 2 refills | Status: DC

## 2018-08-13 ENCOUNTER — Encounter: Payer: Self-pay | Admitting: Family Medicine

## 2018-08-15 ENCOUNTER — Other Ambulatory Visit: Payer: Self-pay

## 2018-08-15 ENCOUNTER — Ambulatory Visit: Payer: Medicare Other | Admitting: Family Medicine

## 2018-08-15 ENCOUNTER — Encounter: Payer: Self-pay | Admitting: Family Medicine

## 2018-08-15 VITALS — BP 148/71 | HR 60 | Wt 125.0 lb

## 2018-08-15 DIAGNOSIS — I480 Paroxysmal atrial fibrillation: Secondary | ICD-10-CM

## 2018-08-15 DIAGNOSIS — I10 Essential (primary) hypertension: Secondary | ICD-10-CM

## 2018-08-15 NOTE — Progress Notes (Signed)
   Subjective:    Patient ID: [name], female    DOB: 06-20-1931, 85 y.o.   MRN: [id number]  HPI Chief Complaint  Patient presents with  . bp running high    bp running high 160-170/70-80's,    Documentation for virtual telephone encounter.  The patient was located at home. The provider was located in the office. The patient did consent to this visit and is aware of possible charges through their insurance for this visit. The other persons participating in this telemedicine service were none.   She has concerns regarding elevated BP. Started checking her BP at home again and states it has been elevated for the past 9 days. 160s-180s/80s-90s.   States her diet has changed since being stuck in her home due to the current pandemic.  Eating more cheese.  She also reports increasing her greens.  Activity level has decreased since not being able to go to the gym. She is walking at home. Doing PT for her arm at home on her own.   Taking amlodipine 5 mg and HCTZ 12.5 mg daily without any side effects.  In the past she has reported side effects with lisinopril and a couple of different ARBs.   Taking Eliquis 2.5 mg for history of A-fib and CVA.  Denies bleeding.  Denies fever, chills, dizziness, chest pain, palpitations, shortness of breath, abdominal pain, N/V/D, urinary symptoms, LE edema.   Reviewed allergies, medications, past medical, surgical, family, and social history.    Review of Systems Pertinent positives and negatives in the history of present illness.     Objective:   Physical Exam BP (!) 148/71   Pulse 60   Wt 125 lb (56.7 kg)   BMI 21.12 kg/m   Alert and oriented and in no acute distress.  Normal speech, mood and thought process.      Assessment & Plan:  Essential hypertension  Paroxysmal atrial fibrillation (HCC)   She is in good spirits and in her usual state of health.  Recently started checking her BP again and readings not in goal range. We  discussed several options and decided that she would increase amlodipine to 7.5 mg and continue on HCTZ 12.5 mg. Declines lisinopril or ARB since she thinks she had unpleasant responses in the past. Continue on Eliquis and advised her to avoid eating excessive amount of greens as she has in the past week.  Counseling on low sodium diet and continue exercise.  She is pleased with plan of care and advised her to continue checking BP at home and report back in 1-2 weeks with her BP readings.   Time spent on call was 12 minutes and in review of previous records 1 minutes total.  This virtual service is not related to other E/M service within previous 7 days.  99441 (5-60min) 99442 (11-66min) 99443 (21-55min)

## 2018-08-16 ENCOUNTER — Other Ambulatory Visit: Payer: Self-pay | Admitting: Family Medicine

## 2018-08-19 NOTE — Telephone Encounter (Signed)
Is this okay to refill? 

## 2018-08-26 ENCOUNTER — Encounter: Payer: Self-pay | Admitting: Family Medicine

## 2018-09-17 ENCOUNTER — Encounter: Payer: Self-pay | Admitting: Family Medicine

## 2018-09-23 ENCOUNTER — Telehealth: Payer: Medicare Other | Admitting: Internal Medicine

## 2018-09-23 ENCOUNTER — Telehealth: Payer: Self-pay | Admitting: Internal Medicine

## 2018-09-23 ENCOUNTER — Encounter: Payer: Self-pay | Admitting: Internal Medicine

## 2018-09-23 ENCOUNTER — Other Ambulatory Visit: Payer: Self-pay

## 2018-09-23 VITALS — BP 135/71 | HR 59 | Ht 64.5 in | Wt 125.0 lb

## 2018-09-23 NOTE — Patient Instructions (Signed)
   Per pt's personal decision, she will follow up PRN.

## 2018-09-23 NOTE — Progress Notes (Signed)
Electrophysiology TeleHealth Note   Due to national recommendations of social distancing due to COVID 19, an audio/video telehealth visit is felt to be most appropriate for this patient at this time.  See MyChart message from today for the patient's consent to telehealth for [facility].   Date:  09/23/2018   ID:  [name], DOB [birth year], MRN [id number]  Location: patient's home  Provider location: [address] [place]  Evaluation Performed: Follow-up visit  PCP:  Girtha Rm, NP-C  Cardiologist:    Electrophysiologist:  SK   Chief Complaint:  I am 1/2 hr late and that has cancelled this appointment   History of Present Illness:    [name] is a 85 y.o. female who presents via audio/video conferencing for a telehealth visit today.  Since last being seen in our clinic for Atrial fib - paroxysmal with asymptomatic post termination pauses and prior hx of Cryptogenic Stroke with implanted LINQ   the patient reports nothing as she hung up as my call to her was delayed   Date Cr K Hgb  3/20 0.75 6.1>>4.8 12.4   Elevated K was attributed to lisinopril ( thought we had stopped it last year 2/2 cough)  HCTZ started and repeat K As above    Echo 11/15 > normal LV function with mild LAE/ Pulm HTN   The patient denies symptoms of fevers, chills, cough, or new SOB worrisome for COVID 19.   Past Medical History:  Diagnosis Date  . Anemia    hx of with surgery   . Arthritis 07-27-11   osteoarthritis-hips/s/p bil. THA, arthritis right hand   . Blood transfusion feb 2014   post surgery some time ago  . Cancer ([place]) 1989   Breast cancer -s/p lt. mastectomy, some squamous cell lesions  . Cold hands   . Complication of anesthesia    epinephrine - screams uncontrollably / pt does not want general anesthesia or narcotics  . Constipation   . Fuchs' corneal dystrophy    both eyes  . Hard of hearing    both ears mild loss  . Hyperkalemia 03/07/2018  .  Hypertension   . Hypothyroidism 3-21- 13   tx. levothyroxine  . Lichen sclerosus et atrophicus of the vulva    pt reported.   . Osteopenia   . Stroke [facility])    a. s/p MDT LINQ 04/2014    Past Surgical History:  Procedure Laterality Date  . ABDOMINAL HYSTERECTOMY     ovaries removed  . ANKLE SURGERY  2005   right ankle tendon repair  . basal cell removed from face     3-4 times  . BREAST SURGERY  07-1987   lt.breast cancer intraductal cancer, mastectomy  . CATARACT EXTRACTION, BILATERAL  few yrs ago   Bilateral  . HARDWARE REMOVAL  04/05/2012   Procedure: HARDWARE REMOVAL;  Surgeon: Gearlean Alf, MD;  Location: [facility];  Service: Orthopedics;  Laterality: Right;  Removal of Hardware Right Femur   . HARDWARE REMOVAL Left 06/30/2013   Procedure: LEFT HIP HARDWARE REMOVAL OF CABLES;  Surgeon: Gearlean Alf, MD;  Location: [facility];  Service: Orthopedics;  Laterality: Left;  . HARDWARE REMOVAL Right 08/21/2013   Procedure: RIGHT HIP HARDWARE REMOVAL;  Surgeon: Gearlean Alf, MD;  Location: [facility];  Service: Orthopedics;  Laterality: Right;  . JOINT REPLACEMENT  07-27-11   Bilateral: Right THA - 04/2000, Left THA 06/2001  . left hip  replacement Left feb 2003  . LOOP RECORDER IMPLANT  04-08-2014   MDT LINQ implanted by Dr Lovena Le for cryptogenic stroke  . LOOP RECORDER REMOVAL N/A 04/26/2017   Procedure: LOOP RECORDER REMOVAL;  Surgeon: Evans Lance, MD;  Location: [facility];  Service: Cardiovascular;  Laterality: N/A;  . MASTECTOMY  1989   left - Pt states has no restrictions on use of L arm for BP or blood draw  . melanoma removed Left    x 1  . ORIF FEMUR FRACTURE  07/31/2011   Procedure: OPEN REDUCTION INTERNAL FIXATION (ORIF) DISTAL FEMUR FRACTURE;  Surgeon: Gearlean Alf, MD;  Location: [facility];  Service: Orthopedics;  Laterality: Right;  right femur  (c-arm)   . SQUAMOUS CELL CARCINOMA EXCISION  07-27-11   x2 left shoulder  . TOTAL HIP REVISION  04/10/2012    Procedure: TOTAL HIP REVISION;  Surgeon: Gearlean Alf, MD;  Location: [facility];  Service: Orthopedics;  Laterality: Right;  . TOTAL HIP REVISION Left 06/27/2012   Procedure: LEFT TOTAL HIP REVISION;  Surgeon: Gearlean Alf, MD;  Location: [facility];  Service: Orthopedics;  Laterality: Left;    Current Outpatient Medications  Medication Sig Dispense Refill  . amLODipine (NORVASC) 5 MG tablet Take 1.5 tablets by mouth daily.    . clobetasol ointment (TEMOVATE) 2.35 % Apply 1 application topically 2 (two) times daily as needed.     . Coenzyme Q10 (COQ-10 PO) Take 1 tablet by mouth daily.    Marland Kitchen docusate sodium (COLACE) 100 MG capsule Take 100 mg by mouth daily.    Marland Kitchen ELIQUIS 2.5 MG TABS tablet TAKE 1 TABLET BY MOUTH TWO  TIMES DAILY 180 tablet 0  . hydrochlorothiazide (MICROZIDE) 12.5 MG capsule TAKE 1 CAPSULE(12.5 MG) BY MOUTH DAILY 90 capsule 0  . levothyroxine (SYNTHROID, LEVOTHROID) 100 MCG tablet TAKE 1 TABLET BY MOUTH  DAILY 90 tablet 1  . Multiple Vitamins-Iron (MULTIVITAMINS WITH IRON) TABS tablet Take 1 tablet by mouth daily.     . Omega-3 Fatty Acids (FISH OIL PO) Take by mouth.    . Propylene Glycol (SYSTANE BALANCE OP) Apply 1 drop to eye daily.    . psyllium (METAMUCIL) 58.6 % powder Take 1 packet by mouth daily.    . Vitamin D, Cholecalciferol, 1000 UNITS TABS Take 1,000 Units by mouth daily.      No current facility-administered medications for this visit.     Allergies:   Lactose intolerance (gi) and Epinephrine   Social History:  The patient  reports that she quit smoking about 55 years ago. Her smoking use included cigarettes. She has a 10.00 pack-year smoking history. She has never used smokeless tobacco. She reports current alcohol use of about 1.0 standard drinks of alcohol per week. She reports that she does not use drugs.   Family History:  The patient's   family history includes Arthritis in her sister; Cancer in her brother, father, and mother; Celiac disease in her  mother; Heart disease in her paternal grandfather and paternal grandmother; Hodgkin's lymphoma in her mother; Melanoma in her father; Ulcerative colitis in her sister.   ROS:  Please see the history of present illness.   All other systems are personally reviewed and negative.    Exam:    Vital Signs:  BP 135/71   Pulse (!) 59   Ht 5' 4.5" (1.638 m)   Wt 125 lb (56.7 kg)   BMI 21.12 kg/m  Labs/Other Tests and Data Reviewed:    Recent Labs: 07/11/2018: ALT 12; Hemoglobin 12.4; Platelets 272; TSH 1.820 07/15/2018: BUN 14; Creatinine, Ser 0.75; Potassium 4.8; Sodium 134   Wt Readings from Last 3 Encounters:  09/23/18 125 lb (56.7 kg)  08/15/18 125 lb (56.7 kg)  07/11/18 127 lb 6.4 oz (57.8 kg)     Other studies personally reviewed: Additional studies/ records that were reviewed today include:As above     ASSESSMENT & PLAN:    Cryptogenic Stroke  Atrial fibrillation    Post termination pauses  ASym  Linq Monitor   Fatigue  Hypertension  Renal insufficiency grade 3  Cough   COVID 19 screen The patient denies symptoms of COVID 19 at this time.  The importance of social distancing was discussed today.  Follow-up:  She will call to make an appointment she says     Current medicines  Were not reviewed by me  Patient Risk:  after full review of this patients clinical status, I feel that they are at moderate  risk at this time.  Today, I have spent 1 minutes with the patient with telehealth technology discussing the above.  Signed, Virl Axe, MD  09/23/2018 9:58 AM     [address] [place] [address] [phone] (office) [phone] (fax)

## 2018-09-23 NOTE — Telephone Encounter (Signed)
New Message   Patient is calling because she does not want to be billed for the lack of an appt that she had scheduled today with Dr. Caryl Comes. Patients wants to be contacted and reassured that she will not be billed.

## 2018-09-24 NOTE — Telephone Encounter (Signed)
Forwarding to [place], who manages our [employer] department for next steps.  Caren Griffins, will you review Lorren's request below and advise?

## 2018-09-25 NOTE — Telephone Encounter (Signed)
This appointment has been unarrived. Thank you.

## 2018-10-03 ENCOUNTER — Other Ambulatory Visit: Payer: Self-pay | Admitting: Family Medicine

## 2018-10-03 DIAGNOSIS — I10 Essential (primary) hypertension: Secondary | ICD-10-CM

## 2018-10-04 MED ORDER — HYDROCHLOROTHIAZIDE 12.5 MG PO CAPS: 12.5000 mg | ORAL_CAPSULE | Freq: Every day | ORAL | 0 refills | Status: DC

## 2018-10-24 ENCOUNTER — Encounter: Payer: Self-pay | Admitting: Family Medicine

## 2018-10-24 ENCOUNTER — Other Ambulatory Visit: Payer: Self-pay | Admitting: Internal Medicine

## 2018-10-24 MED ORDER — AMLODIPINE BESYLATE 5 MG PO TABS: 7.5000 mg | ORAL_TABLET | Freq: Every day | ORAL | 0 refills | Status: DC

## 2018-11-05 ENCOUNTER — Encounter: Payer: Self-pay | Admitting: Family Medicine

## 2018-11-09 ENCOUNTER — Other Ambulatory Visit: Payer: Self-pay | Admitting: Family Medicine

## 2018-11-11 NOTE — Telephone Encounter (Signed)
Is this okay to refill? 

## 2018-11-12 LAB — HM MAMMOGRAPHY

## 2018-11-21 DIAGNOSIS — C439 Malignant melanoma of skin, unspecified: Secondary | ICD-10-CM | POA: Insufficient documentation

## 2018-11-21 DIAGNOSIS — D049 Carcinoma in situ of skin, unspecified: Secondary | ICD-10-CM | POA: Insufficient documentation

## 2018-11-27 ENCOUNTER — Encounter: Payer: Self-pay | Admitting: Family Medicine

## 2018-11-28 ENCOUNTER — Other Ambulatory Visit: Payer: Self-pay | Admitting: Family Medicine

## 2018-11-28 DIAGNOSIS — W57XXXA Bitten or stung by nonvenomous insect and other nonvenomous arthropods, initial encounter: Secondary | ICD-10-CM

## 2018-11-28 MED ORDER — PREDNISONE 10 MG (21) PO TBPK: ORAL_TABLET | Freq: Every day | ORAL | 0 refills | Status: DC

## 2018-12-04 ENCOUNTER — Telehealth: Payer: Self-pay | Admitting: Family Medicine

## 2018-12-04 ENCOUNTER — Other Ambulatory Visit: Payer: Self-pay

## 2018-12-04 DIAGNOSIS — W57XXXA Bitten or stung by nonvenomous insect and other nonvenomous arthropods, initial encounter: Secondary | ICD-10-CM

## 2018-12-04 NOTE — Telephone Encounter (Signed)
Pt called and was prescribed some Prednisone by Vickie for Insect bites and has finished. Pt is still itching and wanted to see if she can get another round sent to the [facility] on [address].

## 2018-12-04 NOTE — Telephone Encounter (Signed)
Not comfortable giving her steroids again.

## 2018-12-04 NOTE — Telephone Encounter (Signed)
Pt was advise [name] 

## 2018-12-08 ENCOUNTER — Encounter: Payer: Self-pay | Admitting: Family Medicine

## 2018-12-09 ENCOUNTER — Other Ambulatory Visit: Payer: Self-pay | Admitting: Family Medicine

## 2018-12-09 DIAGNOSIS — W57XXXA Bitten or stung by nonvenomous insect and other nonvenomous arthropods, initial encounter: Secondary | ICD-10-CM

## 2018-12-09 MED ORDER — LORATADINE 10 MG PO TABS: 10.0000 mg | ORAL_TABLET | Freq: Every day | ORAL | 0 refills | Status: DC

## 2018-12-27 ENCOUNTER — Other Ambulatory Visit: Payer: Self-pay | Admitting: Family Medicine

## 2018-12-27 DIAGNOSIS — I10 Essential (primary) hypertension: Secondary | ICD-10-CM

## 2019-01-04 ENCOUNTER — Other Ambulatory Visit: Payer: Self-pay | Admitting: Family Medicine

## 2019-01-11 ENCOUNTER — Other Ambulatory Visit: Payer: Self-pay | Admitting: Family Medicine

## 2019-01-13 NOTE — Progress Notes (Signed)
[name] is a 85 y.o. female who presents for annual wellness visit, CPE and follow-up on chronic medical conditions.  She has the following concerns:  Skin rash and blepharitis have calmed down.  She saw Dr. Delman Petty and Dr. Katy Petty for these issues after I treated her with oral steroids. She felt great on oral steroids.   HTN- taking 5 mg of amlodipine every other day and then 7.5 mg every other day.  BP at home is well controlled per patient.  She did bring in her blood pressure machine and her readings have been less than 130/80 consistently.  Hypothyroidism- taking medication on empty stomach daily.   She has a new tremor. States for the past month she has noticed a slight tremor when picking up things.    Generalized fatigue.  This is not new.  Gradual decline  Leg cramps at night. She is sitting more overall. Activity level during the day has decreased since Covid-19 pandemic.  Denies cramps during the day  Questioned whether taking a sleep aid would be ok.   States she had one episode of A-fib that was noticeable and bothersome in August. HR was 108 and lasted 5 hours. States she was in the bed and did not feel dizzy or have shortness of breath. She felt very tired. No chest pain associated.   States she had lichen sclerosis in the vagina years ago. Using clobetasol prn.  This was prescribed by her dermatologist.  States she mentioned this to dermatologist recently.  States she does not want a pelvic exam today.  Chronic constipation- stool softener daily is fine.   States she got her flu shot at [facility].     Immunization History  Administered Date(s) Administered  . Gamma Globulin 10/09/1983, 12/18/1984, 07/24/1989  . Hepatitis A 10/19/1995  . Hepatitis B 06/08/2009  . IPV 12/07/1969, 12/19/1983, 11/25/1984, 01/08/1986  . Influenza, High Dose Seasonal PF 02/07/2017, 01/28/2018  . Influenza, Seasonal, Injecte, Preservative Fre 01/19/2014  . Influenza-Unspecified  01/19/2015, 12/30/2018  . OPV 10/19/1995  . Pneumococcal Conjugate-13 02/09/2015  . Pneumococcal Polysaccharide-23 07/06/2001, 01/14/2019  . Td 11/13/1978, 01/04/1989, 10/19/1995, 06/08/2009  . Tdap 08/26/2010  . Typhoid Inactivated 10/09/1983, 11/01/1989, 11/11/2002  . Typhoid Live 12/09/1995, 06/08/2009  . Yellow Fever 08/26/2010  . Zoster 09/12/2006  . Zoster Recombinat (Shingrix) 09/15/2016, 12/20/2016   Last Pap smear: had hysterectomy Last mammogram: July 2019  Last colonoscopy: 2005  Last DEXA: July 2019 showing osteopenia Dentist: Dr. Lenore Petty Ophtho: Dr. Katy Petty Exercise: everyday   Other doctors caring for patient include: Cardio- Dr. Janora Petty- Dr. Hilarie Petty Dermatology- [name] Dentist- Dr. Lenore Petty  Eyes- Dr. Katy Petty    Depression screen:  See questionnaire below.  Depression screen [facility] 2/9 01/14/2019 01/03/2018 09/14/2016 09/09/2015  Decreased Interest 0 0 0 0  Down, Depressed, Hopeless 0 0 0 0  PHQ - 2 Score 0 0 0 0    Fall Risk Screen: see questionnaire below. Fall Risk  01/14/2019 01/03/2018 09/14/2016 09/09/2015  Falls in the past year? 0 No No No  Number falls in past yr: 0 - - -  Injury with Fall? 0 - - -    ADL screen:  See questionnaire below Functional Status Survey: Is the patient deaf or have difficulty hearing?: No Does the patient have difficulty seeing, even when wearing glasses/contacts?: No Does the patient have difficulty concentrating, remembering, or making decisions?: No Does the patient have difficulty walking or climbing stairs?: Yes(stiff) Does the patient have difficulty dressing  or bathing?: No Does the patient have difficulty doing errands alone such as visiting a doctor's office or shopping?: No   End of Life Discussion:  Patient has a living will and medical power of attorney. MOST form reviewed and signed.   Review of Systems Constitutional: -fever, -chills, -sweats, -unexpected weight change, -anorexia, +fatigue Allergy:  -sneezing, -itching, -congestion Dermatology: denies changing moles, rash, lumps, new worrisome lesions ENT: -runny nose, -ear pain, -sore throat, -hoarseness, -sinus pain, -teeth pain, -tinnitus, -hearing loss, -epistaxis Cardiology:  -chest pain, +palpitations, -edema, -orthopnea, -paroxysmal nocturnal dyspnea Respiratory: -cough, -shortness of breath, -dyspnea on exertion, -wheezing, -hemoptysis Gastroenterology: -abdominal pain, -nausea, -vomiting, -diarrhea, -constipation, -blood in stool, -changes in bowel movement, -dysphagia Hematology: -bleeding or bruising problems Musculoskeletal: -arthralgias, -myalgias, -joint swelling, -back pain, -neck pain, +cramping, -gait changes Ophthalmology: -vision changes, -eye redness, -itching, -discharge Urology: -dysuria, -difficulty urinating, -hematuria, -urinary frequency, -urgency, incontinence Neurology: -headache, -weakness, -tingling, -numbness, -speech abnormality, -memory loss, -falls, -dizziness Psychology:  -depressed mood, -agitation, -sleep problems    PHYSICAL EXAM:  BP 140/88   Pulse (!) 44   Temp 97.9 F (36.6 C)   Ht 5\' 5"  (1.651 m)   Wt 122 lb 6.4 oz (55.5 kg)   SpO2 98%   BMI 20.37 kg/m   General Appearance: Alert, cooperative, no distress, appears stated age Head: Normocephalic, without obvious abnormality, atraumatic Eyes: PERRL, conjunctiva/corneas clear, EOM's intact, fundi benign Ears: Normal TM's and external ear canals. Hearing aids removed for exam Nose: mask in place  Throat: mask in place  Neck: Supple, no lymphadenopathy; thyroid: no enlargement/tenderness/nodules; no carotid bruit or JVD Back: Spine nontender, no curvature, ROM normal, no CVA tenderness Lungs: Clear to auscultation bilaterally without wheezes, rales or ronchi; respirations unlabored Chest Wall: No tenderness or deformity Heart: bradycardia which is chronic, regular rhythm, S1 and S2 normal, no murmur, rub or gallop Breast Exam: refuses   Abdomen: Soft, non-tender, nondistended, normoactive bowel sounds, no masses, no hepatosplenomegaly Genitalia: refuses  Extremities: No clubbing, cyanosis or edema Pulses: 2+ and symmetric all extremities Skin: Skin color, texture, turgor normal, no rashes or lesions Lymph nodes: Cervical, supraclavicular, and axillary nodes normal Neurologic: CNII-XII intact, normal strength, sensation and gait; reflexes 2+ and symmetric throughout Psych: Normal mood, affect, hygiene and grooming.  ASSESSMENT/PLAN: Medicare annual wellness visit, subsequent  Routine general medical examination at a health care facility - Plan: CBC with Differential/Platelet, Comprehensive metabolic panel, TSH, Lipid panel -Discussed preventive healthcare, immunizations and safety.  Essential hypertension-blood pressure at home has been well controlled.  Continue with her current medication regimen and continue to keep an eye on her blood pressure.  History of CVA (cerebrovascular accident) without residual deficits-related to atrial fibrillation.  Continue on Eliquis.  No sign of bleeding or side effects  Vitamin D deficiency - Plan: VITAMIN D 25 Hydroxy (Vit-D Deficiency, Fractures)-continue on current vitamin D supplement.  Check vitamin D level and follow-up  Nocturnal leg cramps-check electrolytes.  Discussed taking a warm bath and stretching her lower extremities prior to bed.  She may also use a menthol or Arnica gel  Hypothyroidism, unspecified type - Plan: TSH-continue on current dose of levothyroxine.  Check TSH and follow-up  Blepharitis of left lower eyelid, unspecified type-appears to be resolved.  Continue seeing her eye doctor as needed for this  Chronic constipation-no longer an issue.  Continue on stool softener  Need for prophylactic vaccination against Streptococcus pneumoniae (pneumococcus) - Plan: Pneumococcal polysaccharide vaccine 23-valent greater than or equal to 2yo subcutaneous/IM -Discussed  potential side effects  Advance directive discussed with patient-she has advanced directives at home.  Reviewed MOST today  Immunization counseling-appears to be up-to-date on immunizations   Discussed monthly self breast exams and yearly mammograms; at least 30 minutes of aerobic activity at least 5 days/week and weight-bearing exercise 2x/week; proper sunscreen use reviewed; healthy diet, including goals of calcium and vitamin D intake and alcohol recommendations (less than or equal to 1 drink/day) reviewed; regular seatbelt use; changing batteries in smoke detectors.  Immunization recommendations discussed.  Colonoscopy recommendations reviewed   Medicare Attestation I have personally reviewed: The patient's medical and social history Their use of alcohol, tobacco or illicit drugs Their current medications and supplements The patient's functional ability including ADLs,fall risks, home safety risks, cognitive, and hearing and visual impairment Diet and physical activities Evidence for depression or mood disorders  The patient's weight, height, and BMI have been recorded in the chart.  I have made referrals, counseling, and provided education to the patient based on review of the above and I have provided the patient with a written personalized care plan for preventive services.     Harland Dingwall, NP-C   01/14/2019

## 2019-01-14 ENCOUNTER — Encounter: Payer: Self-pay | Admitting: Family Medicine

## 2019-01-14 ENCOUNTER — Other Ambulatory Visit: Payer: Self-pay

## 2019-01-14 ENCOUNTER — Ambulatory Visit: Payer: Medicare Other | Admitting: Family Medicine

## 2019-01-14 VITALS — BP 140/88 | HR 44 | Temp 97.9°F | Ht 65.0 in | Wt 122.4 lb

## 2019-01-14 DIAGNOSIS — E039 Hypothyroidism, unspecified: Secondary | ICD-10-CM | POA: Diagnosis not present

## 2019-01-14 DIAGNOSIS — Z0000 Encounter for general adult medical examination without abnormal findings: Secondary | ICD-10-CM | POA: Diagnosis not present

## 2019-01-14 DIAGNOSIS — H01006 Unspecified blepharitis left eye, unspecified eyelid: Secondary | ICD-10-CM | POA: Insufficient documentation

## 2019-01-14 DIAGNOSIS — K5909 Other constipation: Secondary | ICD-10-CM | POA: Insufficient documentation

## 2019-01-14 DIAGNOSIS — Z23 Encounter for immunization: Secondary | ICD-10-CM

## 2019-01-14 DIAGNOSIS — E559 Vitamin D deficiency, unspecified: Secondary | ICD-10-CM | POA: Diagnosis not present

## 2019-01-14 DIAGNOSIS — I10 Essential (primary) hypertension: Secondary | ICD-10-CM | POA: Diagnosis not present

## 2019-01-14 DIAGNOSIS — Z7185 Encounter for immunization safety counseling: Secondary | ICD-10-CM

## 2019-01-14 DIAGNOSIS — Z7189 Other specified counseling: Secondary | ICD-10-CM

## 2019-01-14 DIAGNOSIS — G4762 Sleep related leg cramps: Secondary | ICD-10-CM

## 2019-01-14 DIAGNOSIS — Z8673 Personal history of transient ischemic attack (TIA), and cerebral infarction without residual deficits: Secondary | ICD-10-CM | POA: Diagnosis not present

## 2019-01-14 DIAGNOSIS — H01005 Unspecified blepharitis left lower eyelid: Secondary | ICD-10-CM

## 2019-01-14 NOTE — Telephone Encounter (Signed)
Pt has an appt today . Will await appt

## 2019-01-14 NOTE — Patient Instructions (Signed)
[name] , Thank you for taking time to come for your Medicare Wellness Visit. I appreciate your ongoing commitment to your health goals. Please review the following plan we discussed and let me know if I can assist you in the future.   These are the goals we discussed:  Make sure your blood pressure readings are consistently less than 140/80. We can always adjust your medication if needed.   Try a warm bath and leg stretches before bed as discussed. You may also try using a menthol or Arnicare gel to see if this helps.   You appear to be up to date with preventive health screenings as well as immunizations.     This is a list of the screening recommended for you and due dates:  Health Maintenance  Topic Date Due  . Flu Shot  12/07/2018  . Tetanus Vaccine  08/25/2020  . DEXA scan (bone density measurement)  Completed  . Pneumonia vaccines  Completed      Preventive Care 42 Years and Older, Female Preventive care refers to lifestyle choices and visits with your health care provider that can promote health and wellness. This includes:  A yearly physical exam. This is also called an annual well check.  Regular dental and eye exams.  Immunizations.  Screening for certain conditions.  Healthy lifestyle choices, such as diet and exercise. What can I expect for my preventive care visit? Physical exam Your health care provider will check:  Height and weight. These may be used to calculate body mass index (BMI), which is a measurement that tells if you are at a healthy weight.  Heart rate and blood pressure.  Your skin for abnormal spots. Counseling Your health care provider may ask you questions about:  Alcohol, tobacco, and drug use.  Emotional well-being.  Home and relationship well-being.  Sexual activity.  Eating habits.  History of falls.  Memory and ability to understand (cognition).  Work and work Statistician.  Pregnancy and menstrual history. What  immunizations do I need?  Influenza (flu) vaccine  This is recommended every year. Tetanus, diphtheria, and pertussis (Tdap) vaccine  You may need a Td booster every 10 years. Varicella (chickenpox) vaccine  You may need this vaccine if you have not already been vaccinated. Zoster (shingles) vaccine  You may need this after age 54. Pneumococcal conjugate (PCV13) vaccine  One dose is recommended after age 38. Pneumococcal polysaccharide (PPSV23) vaccine  One dose is recommended after age 87. Measles, mumps, and rubella (MMR) vaccine  You may need at least one dose of MMR if you were born in 1957 or later. You may also need a second dose. Meningococcal conjugate (MenACWY) vaccine  You may need this if you have certain conditions. Hepatitis A vaccine  You may need this if you have certain conditions or if you travel or work in places where you may be exposed to hepatitis A. Hepatitis B vaccine  You may need this if you have certain conditions or if you travel or work in places where you may be exposed to hepatitis B. Haemophilus influenzae type b (Hib) vaccine  You may need this if you have certain conditions. You may receive vaccines as individual doses or as more than one vaccine together in one shot (combination vaccines). Talk with your health care provider about the risks and benefits of combination vaccines. What tests do I need? Blood tests  Lipid and cholesterol levels. These may be checked every 5 years, or more frequently depending  on your overall health.  Hepatitis C test.  Hepatitis B test. Screening  Lung cancer screening. You may have this screening every year starting at age 25 if you have a 30-pack-year history of smoking and currently smoke or have quit within the past 15 years.  Colorectal cancer screening. All adults should have this screening starting at age 46 and continuing until age 34. Your health care provider may recommend screening at age 29 if  you are at increased risk. You will have tests every 1-10 years, depending on your results and the type of screening test.  Diabetes screening. This is done by checking your blood sugar (glucose) after you have not eaten for a while (fasting). You may have this done every 1-3 years.  Mammogram. This may be done every 1-2 years. Talk with your health care provider about how often you should have regular mammograms.  BRCA-related cancer screening. This may be done if you have a family history of breast, ovarian, tubal, or peritoneal cancers. Other tests  Sexually transmitted disease (STD) testing.  Bone density scan. This is done to screen for osteoporosis. You may have this done starting at age 89. Follow these instructions at home: Eating and drinking  Eat a diet that includes fresh fruits and vegetables, whole grains, lean protein, and low-fat dairy products. Limit your intake of foods with high amounts of sugar, saturated fats, and salt.  Take vitamin and mineral supplements as recommended by your health care provider.  Do not drink alcohol if your health care provider tells you not to drink.  If you drink alcohol: ? Limit how much you have to 0-1 drink a day. ? Be aware of how much alcohol is in your drink. In the [place] one drink equals one 12 oz bottle of beer (355 mL), one 5 oz glass of wine (148 mL), or one 1 oz glass of hard liquor (44 mL). Lifestyle  Take daily care of your teeth and gums.  Stay active. Exercise for at least 30 minutes on 5 or more days each week.  Do not use any products that contain nicotine or tobacco, such as cigarettes, e-cigarettes, and chewing tobacco. If you need help quitting, ask your health care provider.  If you are sexually active, practice safe sex. Use a condom or other form of protection in order to prevent STIs (sexually transmitted infections).  Talk with your health care provider about taking a low-dose aspirin or statin. What's next?   Go to your health care provider once a year for a well check visit.  Ask your health care provider how often you should have your eyes and teeth checked.  Stay up to date on all vaccines. This information is not intended to replace advice given to you by your health care provider. Make sure you discuss any questions you have with your health care provider. Document Released: 05/21/2015 Document Revised: 04/18/2018 Document Reviewed: 04/18/2018 Elsevier Patient Education  2020 [employer].

## 2019-01-14 NOTE — Telephone Encounter (Signed)
Pt has enough and will refill after labs

## 2019-01-15 LAB — LIPID PANEL
Chol/HDL Ratio: 2.7 ratio (ref 0.0–4.4)
Cholesterol, Total: 231 mg/dL — ABNORMAL HIGH (ref 100–199)
HDL: 85 mg/dL (ref >39)
LDL Chol Calc (NIH): 137 mg/dL — ABNORMAL HIGH (ref 0–99)
Triglycerides: 54 mg/dL (ref 0–149)
VLDL Cholesterol Cal: 9 mg/dL (ref 5–40)

## 2019-01-15 LAB — COMPREHENSIVE METABOLIC PANEL
ALT: 15 IU/L (ref 0–32)
AST: 19 IU/L (ref 0–40)
Albumin/Globulin Ratio: 2 (ref 1.2–2.2)
Albumin: 4.5 g/dL (ref 3.6–4.6)
Alkaline Phosphatase: 68 IU/L (ref 39–117)
BUN/Creatinine Ratio: 18 (ref 12–28)
BUN: 12 mg/dL (ref 8–27)
Bilirubin Total: 0.5 mg/dL (ref 0.0–1.2)
CO2: 25 mmol/L (ref 20–29)
Calcium: 9.4 mg/dL (ref 8.7–10.3)
Chloride: 97 mmol/L (ref 96–106)
Creatinine, Ser: 0.68 mg/dL (ref 0.57–1.00)
GFR calc Af Amer: 92 mL/min/{1.73_m2} (ref >59)
GFR calc non Af Amer: 80 mL/min/{1.73_m2} (ref >59)
Globulin, Total: 2.2 g/dL (ref 1.5–4.5)
Glucose: 90 mg/dL (ref 65–99)
Potassium: 4.9 mmol/L (ref 3.5–5.2)
Sodium: 136 mmol/L (ref 134–144)
Total Protein: 6.7 g/dL (ref 6.0–8.5)

## 2019-01-15 LAB — CBC WITH DIFFERENTIAL/PLATELET
Basophils Absolute: 0.1 10*3/uL (ref 0.0–0.2)
Basos: 1 %
EOS (ABSOLUTE): 0.1 10*3/uL (ref 0.0–0.4)
Eos: 2 %
Hematocrit: 36.4 % (ref 34.0–46.6)
Hemoglobin: 12.3 g/dL (ref 11.1–15.9)
Immature Grans (Abs): 0 10*3/uL (ref 0.0–0.1)
Immature Granulocytes: 0 %
Lymphocytes Absolute: 1.6 10*3/uL (ref 0.7–3.1)
Lymphs: 27 %
MCH: 31.6 pg (ref 26.6–33.0)
MCHC: 33.8 g/dL (ref 31.5–35.7)
MCV: 94 fL (ref 79–97)
Monocytes Absolute: 0.7 10*3/uL (ref 0.1–0.9)
Monocytes: 12 %
Neutrophils Absolute: 3.4 10*3/uL (ref 1.4–7.0)
Neutrophils: 58 %
Platelets: 188 10*3/uL (ref 150–450)
RBC: 3.89 x10E6/uL (ref 3.77–5.28)
RDW: 14.1 % (ref 11.7–15.4)
WBC: 5.9 10*3/uL (ref 3.4–10.8)

## 2019-01-15 LAB — TSH: TSH: 3.64 u[IU]/mL (ref 0.450–4.500)

## 2019-01-15 LAB — VITAMIN D 25 HYDROXY (VIT D DEFICIENCY, FRACTURES): Vit D, 25-Hydroxy: 54.1 ng/mL (ref 30.0–100.0)

## 2019-01-16 ENCOUNTER — Telehealth: Payer: Self-pay | Admitting: Internal Medicine

## 2019-01-16 NOTE — Telephone Encounter (Signed)
Pt states that her tremor are slight and she doesn't want to schedule something unless they get back. She will call back to schedule if she gets worse.

## 2019-01-27 ENCOUNTER — Other Ambulatory Visit: Payer: Self-pay | Admitting: Family Medicine

## 2019-02-03 ENCOUNTER — Other Ambulatory Visit: Payer: Self-pay | Admitting: Family Medicine

## 2019-02-04 NOTE — Telephone Encounter (Signed)
Is this okay to refill? 

## 2019-02-25 ENCOUNTER — Encounter: Payer: Self-pay | Admitting: Internal Medicine

## 2019-02-28 ENCOUNTER — Telehealth: Payer: Self-pay | Admitting: Family Medicine

## 2019-02-28 NOTE — Telephone Encounter (Signed)
Received requested records from [facility] 

## 2019-03-06 ENCOUNTER — Encounter: Payer: Self-pay | Admitting: Internal Medicine

## 2019-03-21 ENCOUNTER — Encounter: Payer: Self-pay | Admitting: Family Medicine

## 2019-03-21 ENCOUNTER — Other Ambulatory Visit: Payer: Self-pay

## 2019-03-21 DIAGNOSIS — Z20822 Contact with and (suspected) exposure to covid-19: Secondary | ICD-10-CM

## 2019-03-24 LAB — NOVEL CORONAVIRUS, NAA: SARS-CoV-2, NAA: NOT DETECTED

## 2019-03-25 ENCOUNTER — Encounter: Payer: Self-pay | Admitting: Family Medicine

## 2019-03-27 ENCOUNTER — Encounter: Payer: Self-pay | Admitting: Family Medicine

## 2019-03-27 ENCOUNTER — Telehealth: Payer: Self-pay | Admitting: Family Medicine

## 2019-03-27 NOTE — Telephone Encounter (Signed)
Please let her know that I am out of the office tomorrow through next week. If she feels that she needs a virtual visit then one of other providers would be happy to see her. If she feels that she needs to be seen in person, I would like for her to go to an urgent care. I hope she feels better soon.

## 2019-03-27 NOTE — Telephone Encounter (Signed)
Pt called and states that the temp that she gave u may not be accurate states that it is a old mercury thermo ter and states she can not read it clearly, and I informed her of what your notes said and she said she would hate to go to a urgent care and expose any one else she would just stay and home pt can be reached at [phone]

## 2019-03-27 NOTE — Telephone Encounter (Signed)
Called and informed pt.  

## 2019-03-30 ENCOUNTER — Other Ambulatory Visit: Payer: Self-pay | Admitting: Family Medicine

## 2019-03-30 DIAGNOSIS — I10 Essential (primary) hypertension: Secondary | ICD-10-CM

## 2019-03-31 MED ORDER — HYDROCHLOROTHIAZIDE 12.5 MG PO CAPS: ORAL_CAPSULE | ORAL | 0 refills | Status: DC

## 2019-04-04 ENCOUNTER — Other Ambulatory Visit: Payer: Self-pay | Admitting: Family Medicine

## 2019-04-04 DIAGNOSIS — I10 Essential (primary) hypertension: Secondary | ICD-10-CM

## 2019-04-06 ENCOUNTER — Encounter: Payer: Self-pay | Admitting: Family Medicine

## 2019-04-07 ENCOUNTER — Telehealth: Payer: Self-pay

## 2019-04-08 NOTE — Telephone Encounter (Signed)
error 

## 2019-05-10 ENCOUNTER — Other Ambulatory Visit: Payer: Self-pay | Admitting: Family Medicine

## 2019-05-31 ENCOUNTER — Encounter: Payer: Self-pay | Admitting: Family Medicine

## 2019-06-05 ENCOUNTER — Ambulatory Visit: Payer: Medicare Other

## 2019-06-13 ENCOUNTER — Other Ambulatory Visit: Payer: Self-pay | Admitting: Family Medicine

## 2019-06-14 ENCOUNTER — Ambulatory Visit: Payer: Medicare PPO | Attending: Internal Medicine

## 2019-06-14 DIAGNOSIS — Z23 Encounter for immunization: Secondary | ICD-10-CM | POA: Insufficient documentation

## 2019-06-14 NOTE — Progress Notes (Signed)
   Covid-19 Vaccination Clinic  Name:  [name]    MRN: [id number] DOB: 1932/04/20  06/14/2019  [name] was observed post Covid-19 immunization for 15 minutes without incidence. She was provided with Vaccine Information Sheet and instruction to access the V-Safe system.   [name] was instructed to call 911 with any severe reactions post vaccine: Marland Kitchen Difficulty breathing  . Swelling of your face and throat  . A fast heartbeat  . A bad rash all over your body  . Dizziness and weakness    Immunizations Administered    Name Date Dose VIS Date Route   Pfizer COVID-19 Vaccine 06/14/2019 12:11 PM 0.3 mL 04/18/2019 Intramuscular   Manufacturer: [place]   Lot: [id number]   [place]: [id number]

## 2019-06-22 ENCOUNTER — Ambulatory Visit: Payer: Medicare Other

## 2019-06-24 ENCOUNTER — Telehealth: Payer: Self-pay

## 2019-06-24 DIAGNOSIS — I10 Essential (primary) hypertension: Secondary | ICD-10-CM

## 2019-06-24 NOTE — Telephone Encounter (Signed)
I received a fax from [facility] for a refill on the pts. Levothyroxine pt. Last apt. Was 01/14/19

## 2019-06-25 MED ORDER — LEVOTHYROXINE SODIUM 100 MCG PO TABS: 100.0000 ug | ORAL_TABLET | Freq: Every day | ORAL | 0 refills | Status: DC

## 2019-06-25 MED ORDER — HYDROCHLOROTHIAZIDE 12.5 MG PO CAPS: ORAL_CAPSULE | ORAL | 0 refills | Status: DC

## 2019-06-25 MED ORDER — APIXABAN 2.5 MG PO TABS: 2.5000 mg | ORAL_TABLET | Freq: Two times a day (BID) | ORAL | 0 refills | Status: DC

## 2019-07-09 ENCOUNTER — Ambulatory Visit: Payer: Medicare PPO | Attending: Internal Medicine

## 2019-07-09 DIAGNOSIS — Z23 Encounter for immunization: Secondary | ICD-10-CM | POA: Insufficient documentation

## 2019-07-09 NOTE — Progress Notes (Signed)
   Covid-19 Vaccination Clinic  Name:  [name]    MRN: [id number] DOB: August 08, 1931  07/09/2019  [name] was observed post Covid-19 immunization for 15 minutes without incident. She was provided with Vaccine Information Sheet and instruction to access the V-Safe system.   [name] was instructed to call 911 with any severe reactions post vaccine: Marland Kitchen Difficulty breathing  . Swelling of face and throat  . A fast heartbeat  . A bad rash all over body  . Dizziness and weakness   Immunizations Administered    Name Date Dose VIS Date Route   Pfizer COVID-19 Vaccine 07/09/2019  8:40 AM 0.3 mL 04/18/2019 Intramuscular   Manufacturer: [place]   Lot: [id number]   [place]: [id number]

## 2019-07-15 ENCOUNTER — Ambulatory Visit: Payer: Medicare Other | Admitting: Family Medicine

## 2019-07-23 NOTE — Progress Notes (Signed)
   Subjective:    Patient ID: [name], female    DOB: Jan 12, 1932, 86 y.o.   MRN: [id number]  HPI Chief Complaint  Patient presents with  . 6 month follow-up    6 month follow-up. some concerns   She is here for a 6 month medication management visit. Drove herself today. Lives alone with family very close, in the next house.  She reports having her usual muscle and joint aches, fatigue and mild memory issues. No falls.  States she seems to forget names more often. Is not forgetting how to care for herself. Is not misplacing things or getting lost.   HTN- taking amlodipine 7.5 mg and HCTZ 12.5 mg. Doing fine. No concerns.   States she has a planters wart on the sole of her right foot. She works on this herself. It is bothersome for her.   A-fib- one episode since I last saw her. No chest pain, DOE, PND, orthopnea or LE edema.   Taking Eliquis daily and no sign of bleeding.   Hypothyroidism- taking levothyroxine without issues   Vitamin D def- she is on a supplement.   Elevated LDL- declines statin. Has not done well in the past.   States her urine always seems to have a bad odor. No other urinary symptoms.   She had both Covid-19 vaccines with her 2nd shot 2 weeks ago.   Chronic constipation- taking stool softener daily which does help. No abdominal pain, N/V.  No other concerns today.    Reviewed allergies, medications, past medical, surgical, family, and social history.   Review of Systems Pertinent positives and negatives in the history of present illness.     Objective:   Physical Exam BP 124/70   Pulse (!) 54   Wt 123 lb 12.8 oz (56.2 kg)   BMI 20.60 kg/m   Alert and in no distress. Tympanic membranes and canals are normal. Cardiac exam shows a regular  rhythm without murmurs or gallops. Lungs are clear to auscultation. Right foot with a plantar wart. Skin is warm and dry.      Assessment & Plan:  Essential hypertension - Plan: CBC with  Differential/Platelet, Comprehensive metabolic panel -BP controlled. Continue current medication regimen. Follow up pending labs.   Paroxysmal atrial fibrillation (HCC) - in a regular rhythm today. On Eliquis. Sees cardiology prn.   Hypothyroidism, unspecified type - Plan: TSH -continue on levothyroxine and adjust medication if needed.   Dyslipidemia -eats a healthy diet and exercises. Declines statin.   History of CVA (cerebrovascular accident) without residual deficits  High risk medication use -chronic anticoagulation without any signs of bleeding   Chronic constipation -managed with stool softenere  Plantar wart - Plan: Ambulatory referral to Podiatry  Statin declined  Abnormal urine odor - Plan: POCT Urinalysis DIP (Proadvantage Device) -UA negative.

## 2019-07-24 ENCOUNTER — Other Ambulatory Visit: Payer: Self-pay

## 2019-07-24 ENCOUNTER — Encounter: Payer: Self-pay | Admitting: Family Medicine

## 2019-07-24 ENCOUNTER — Ambulatory Visit: Payer: Medicare PPO | Admitting: Family Medicine

## 2019-07-24 VITALS — BP 124/70 | HR 54 | Wt 123.8 lb

## 2019-07-24 DIAGNOSIS — Z8673 Personal history of transient ischemic attack (TIA), and cerebral infarction without residual deficits: Secondary | ICD-10-CM | POA: Diagnosis not present

## 2019-07-24 DIAGNOSIS — Z79899 Other long term (current) drug therapy: Secondary | ICD-10-CM | POA: Diagnosis not present

## 2019-07-24 DIAGNOSIS — I10 Essential (primary) hypertension: Secondary | ICD-10-CM | POA: Diagnosis not present

## 2019-07-24 DIAGNOSIS — R8290 Unspecified abnormal findings in urine: Secondary | ICD-10-CM | POA: Diagnosis not present

## 2019-07-24 DIAGNOSIS — E785 Hyperlipidemia, unspecified: Secondary | ICD-10-CM | POA: Diagnosis not present

## 2019-07-24 DIAGNOSIS — I480 Paroxysmal atrial fibrillation: Secondary | ICD-10-CM | POA: Diagnosis not present

## 2019-07-24 DIAGNOSIS — B070 Plantar wart: Secondary | ICD-10-CM | POA: Insufficient documentation

## 2019-07-24 DIAGNOSIS — E039 Hypothyroidism, unspecified: Secondary | ICD-10-CM | POA: Diagnosis not present

## 2019-07-24 DIAGNOSIS — K5909 Other constipation: Secondary | ICD-10-CM | POA: Diagnosis not present

## 2019-07-24 DIAGNOSIS — Z5320 Procedure and treatment not carried out because of patient's decision for unspecified reasons: Secondary | ICD-10-CM | POA: Insufficient documentation

## 2019-07-24 LAB — POCT URINALYSIS DIP (PROADVANTAGE DEVICE)
Bilirubin, UA: NEGATIVE
Blood, UA: NEGATIVE
Glucose, UA: NEGATIVE mg/dL
Ketones, POC UA: NEGATIVE mg/dL
Leukocytes, UA: NEGATIVE
Nitrite, UA: NEGATIVE
Protein Ur, POC: NEGATIVE mg/dL
Specific Gravity, Urine: 1.015
Urobilinogen, Ur: NEGATIVE
pH, UA: 7 (ref 5.0–8.0)

## 2019-07-24 NOTE — Patient Instructions (Signed)
Your blood pressure looks good today. Continue on your current medications.   I will be in touch with your lab results.

## 2019-07-25 LAB — COMPREHENSIVE METABOLIC PANEL
ALT: 14 IU/L (ref 0–32)
AST: 20 IU/L (ref 0–40)
Albumin/Globulin Ratio: 2.1 (ref 1.2–2.2)
Albumin: 4.4 g/dL (ref 3.6–4.6)
Alkaline Phosphatase: 74 IU/L (ref 39–117)
BUN/Creatinine Ratio: 17 (ref 12–28)
BUN: 13 mg/dL (ref 8–27)
Bilirubin Total: 0.4 mg/dL (ref 0.0–1.2)
CO2: 26 mmol/L (ref 20–29)
Calcium: 9.4 mg/dL (ref 8.7–10.3)
Chloride: 97 mmol/L (ref 96–106)
Creatinine, Ser: 0.75 mg/dL (ref 0.57–1.00)
GFR calc Af Amer: 83 mL/min/{1.73_m2} (ref >59)
GFR calc non Af Amer: 72 mL/min/{1.73_m2} (ref >59)
Globulin, Total: 2.1 g/dL (ref 1.5–4.5)
Glucose: 73 mg/dL (ref 65–99)
Potassium: 4.6 mmol/L (ref 3.5–5.2)
Sodium: 135 mmol/L (ref 134–144)
Total Protein: 6.5 g/dL (ref 6.0–8.5)

## 2019-07-25 LAB — CBC WITH DIFFERENTIAL/PLATELET
Basophils Absolute: 0.1 10*3/uL (ref 0.0–0.2)
Basos: 2 %
EOS (ABSOLUTE): 0.1 10*3/uL (ref 0.0–0.4)
Eos: 2 %
Hematocrit: 37.4 % (ref 34.0–46.6)
Hemoglobin: 12.5 g/dL (ref 11.1–15.9)
Immature Grans (Abs): 0 10*3/uL (ref 0.0–0.1)
Immature Granulocytes: 0 %
Lymphocytes Absolute: 1.5 10*3/uL (ref 0.7–3.1)
Lymphs: 27 %
MCH: 31.8 pg (ref 26.6–33.0)
MCHC: 33.4 g/dL (ref 31.5–35.7)
MCV: 95 fL (ref 79–97)
Monocytes Absolute: 0.8 10*3/uL (ref 0.1–0.9)
Monocytes: 14 %
Neutrophils Absolute: 3.1 10*3/uL (ref 1.4–7.0)
Neutrophils: 55 %
Platelets: 218 10*3/uL (ref 150–450)
RBC: 3.93 x10E6/uL (ref 3.77–5.28)
RDW: 13.8 % (ref 11.7–15.4)
WBC: 5.5 10*3/uL (ref 3.4–10.8)

## 2019-07-25 LAB — TSH: TSH: 2.85 u[IU]/mL (ref 0.450–4.500)

## 2019-08-15 ENCOUNTER — Encounter: Payer: Self-pay | Admitting: Family Medicine

## 2019-08-15 ENCOUNTER — Other Ambulatory Visit: Payer: Self-pay | Admitting: Family Medicine

## 2019-08-15 DIAGNOSIS — Z8673 Personal history of transient ischemic attack (TIA), and cerebral infarction without residual deficits: Secondary | ICD-10-CM

## 2019-08-15 DIAGNOSIS — M6289 Other specified disorders of muscle: Secondary | ICD-10-CM

## 2019-08-15 DIAGNOSIS — R251 Tremor, unspecified: Secondary | ICD-10-CM

## 2019-09-05 ENCOUNTER — Encounter: Payer: Self-pay | Admitting: Family Medicine

## 2019-09-14 ENCOUNTER — Encounter: Payer: Self-pay | Admitting: Family Medicine

## 2019-09-23 ENCOUNTER — Encounter: Payer: Self-pay | Admitting: Internal Medicine

## 2019-09-25 ENCOUNTER — Other Ambulatory Visit: Payer: Self-pay

## 2019-09-25 ENCOUNTER — Ambulatory Visit: Payer: Medicare PPO | Admitting: Family Medicine

## 2019-09-25 ENCOUNTER — Encounter: Payer: Self-pay | Admitting: Family Medicine

## 2019-09-25 VITALS — BP 124/74 | HR 57 | Wt 124.4 lb

## 2019-09-25 DIAGNOSIS — M6289 Other specified disorders of muscle: Secondary | ICD-10-CM | POA: Diagnosis not present

## 2019-09-25 DIAGNOSIS — M6281 Muscle weakness (generalized): Secondary | ICD-10-CM | POA: Diagnosis not present

## 2019-09-25 DIAGNOSIS — R251 Tremor, unspecified: Secondary | ICD-10-CM

## 2019-09-25 DIAGNOSIS — I10 Essential (primary) hypertension: Secondary | ICD-10-CM | POA: Diagnosis not present

## 2019-09-25 NOTE — Progress Notes (Signed)
   Subjective:    Patient ID: [name], female    DOB: Jun 17, 1931, 86 y.o.   MRN: [id number]  HPI Chief Complaint  Patient presents with  . tremor;s    tremors. slow movements, difficult with fine motor movements, fatigue, very fustrated with techology,  wants referral to neurology for possible parkinson's. needs refills.    She is here with complaint of left hand tremor for the past 1 1/2 years. Tremor present when carrying something.  States her fine motor skills are not as sharp.  She has been complaining for years about stiff leg muscles and muscle weakness but she has managed to continue with her hikes and exercises. Now it takes her much longer to do her walks.   States lately she has started using a walker in her house. No falls.   States she has been reading about Parkinson's recently and she has several symptoms such as fatigue and anxiety along with the previously mentioned symptoms.   Denies depression or sleep disturbance.   Denies fever, chills, dizziness, chest pain, palpitations, shortness of breath, abdominal pain, N/V/D, urinary symptoms, LE edema.   Reviewed allergies, medications, past medical, surgical, family, and social history.     Review of Systems Pertinent positives and negatives in the history of present illness.     Objective:   Physical Exam Constitutional:      General: She is not in acute distress.    Appearance: Normal appearance. She is not ill-appearing.  Pulmonary:     Effort: Pulmonary effort is normal.  Musculoskeletal:     Cervical back: Normal range of motion and neck supple.  Neurological:     Mental Status: She is alert and oriented to person, place, and time.     Cranial Nerves: Cranial nerves are intact.     Sensory: Sensation is intact.     Motor: Tremor present. No weakness or abnormal muscle tone.     Coordination: Coordination is intact.     Gait: Gait is intact.     Comments: Very mild action tremor of left hand. No  cogwheel.  No shuffling with ambulation.  Psychiatric:        Attention and Perception: Attention normal.        Mood and Affect: Mood and affect normal.        Speech: Speech normal.        Behavior: Behavior normal.        Thought Content: Thought content normal.        Cognition and Memory: Cognition and memory normal.     Comments: Denies hallucinations     BP 124/74   Pulse (!) 57   Wt 124 lb 6.4 oz (56.4 kg)   SpO2 98%   BMI 20.70 kg/m       Assessment & Plan:  Muscle stiffness  Decreased muscle strength  Essential hypertension  Tremor of left hand  Discussed that her neurological exam is essentially normal. Very mild action tremor of her left hand, almost unnoticeable today. Discussed referral to neurology but we will hold off for now. She will let me know if she is worsening. We may also try referral to PT for assessment and strength conditioning.  BP in goal range and we will refill medications.

## 2019-09-26 ENCOUNTER — Encounter: Payer: Self-pay | Admitting: Internal Medicine

## 2019-09-26 ENCOUNTER — Other Ambulatory Visit: Payer: Self-pay | Admitting: Internal Medicine

## 2019-09-26 DIAGNOSIS — M978XXA Periprosthetic fracture around other internal prosthetic joint, initial encounter: Secondary | ICD-10-CM

## 2019-09-26 DIAGNOSIS — I10 Essential (primary) hypertension: Secondary | ICD-10-CM

## 2019-09-26 DIAGNOSIS — M6281 Muscle weakness (generalized): Secondary | ICD-10-CM

## 2019-09-26 DIAGNOSIS — M6289 Other specified disorders of muscle: Secondary | ICD-10-CM

## 2019-09-26 MED ORDER — AMLODIPINE BESYLATE 5 MG PO TABS: ORAL_TABLET | ORAL | 1 refills | Status: DC

## 2019-09-26 MED ORDER — HYDROCHLOROTHIAZIDE 12.5 MG PO CAPS: ORAL_CAPSULE | ORAL | 1 refills | Status: DC

## 2019-09-27 ENCOUNTER — Other Ambulatory Visit: Payer: Self-pay | Admitting: Family Medicine

## 2019-10-14 DIAGNOSIS — M25652 Stiffness of left hip, not elsewhere classified: Secondary | ICD-10-CM | POA: Diagnosis not present

## 2019-10-14 DIAGNOSIS — M25651 Stiffness of right hip, not elsewhere classified: Secondary | ICD-10-CM | POA: Diagnosis not present

## 2019-10-14 DIAGNOSIS — R2689 Other abnormalities of gait and mobility: Secondary | ICD-10-CM | POA: Diagnosis not present

## 2019-11-13 ENCOUNTER — Encounter: Payer: Self-pay | Admitting: Family Medicine

## 2019-11-13 DIAGNOSIS — I480 Paroxysmal atrial fibrillation: Secondary | ICD-10-CM

## 2019-11-18 DIAGNOSIS — Z1231 Encounter for screening mammogram for malignant neoplasm of breast: Secondary | ICD-10-CM | POA: Diagnosis not present

## 2019-11-18 LAB — HM MAMMOGRAPHY

## 2019-11-20 ENCOUNTER — Encounter: Payer: Self-pay | Admitting: Internal Medicine

## 2019-11-20 ENCOUNTER — Telehealth: Payer: Self-pay

## 2019-11-20 ENCOUNTER — Telehealth (INDEPENDENT_AMBULATORY_CARE_PROVIDER_SITE_OTHER): Payer: Medicare PPO | Admitting: Internal Medicine

## 2019-11-20 VITALS — BP 119/66 | HR 59 | Ht 65.0 in | Wt 122.0 lb

## 2019-11-20 DIAGNOSIS — I639 Cerebral infarction, unspecified: Secondary | ICD-10-CM | POA: Diagnosis not present

## 2019-11-20 DIAGNOSIS — I4891 Unspecified atrial fibrillation: Secondary | ICD-10-CM

## 2019-11-20 DIAGNOSIS — Z9889 Other specified postprocedural states: Secondary | ICD-10-CM | POA: Diagnosis not present

## 2019-11-20 NOTE — Patient Instructions (Signed)
Medication Instructions:  Your physician has recommended you make the following change in your medication:   Decrease your Amlodipine to 5mg   *If you need a refill on your cardiac medications before your next appointment, please call your pharmacy*   Lab Work: None ordered.  If you have labs (blood work) drawn today and your tests are completely normal, you will receive your results only by: Marland Kitchen MyChart Message (if you have MyChart) OR . A paper copy in the mail If you have any lab test that is abnormal or we need to change your treatment, we will call you to review the results.   Testing/Procedures: None ordered.    Follow-Up: At [facility], you and your health needs are our priority.  As part of our continuing mission to provide you with exceptional heart care, we have created designated Provider Care Teams.  These Care Teams include your primary Cardiologist (physician) and Advanced Practice Providers (APPs -  Physician Assistants and Nurse Practitioners) who all work together to provide you with the care you need, when you need it.  We recommend signing up for the patient portal called "MyChart".  Sign up information is provided on this After Visit Summary.  MyChart is used to connect with patients for Virtual Visits (Telemedicine).  Patients are able to view lab/test results, encounter notes, upcoming appointments, etc.  Non-urgent messages can be sent to your provider as well.   To learn more about what you can do with MyChart, go to [email or web address].    Your next appointment:

## 2019-11-20 NOTE — Progress Notes (Signed)
Electrophysiology TeleHealth Note   Due to national recommendations of social distancing due to COVID 19, an audio/video telehealth visit is felt to be most appropriate for this patient at this time.  See MyChart message from today for the patient's consent to telehealth for [facility].   Date:  11/20/2019   ID:  [name], DOB February 17, 1932, MRN [id number]  Location: patient's home  Provider location: [address] [place]  Evaluation Performed: Follow-up visit   PCP:  Girtha Rm, NP-C  Cardiologist:    Electrophysiologist:  SK   Chief Complaint:  I am 1/2 hr late and that has cancelled this appointment   History of Present Illness:    [name] is a 86 y.o. female who presents via audio/video conferencing for a telehealth visit today.  Since last being seen in our clinic for Atrial fib - paroxysmal with asymptomatic post termination pauses and prior hx of Cryptogenic Stroke with implanted LINQ   the patient reports another interval episode of Afib duration of about 4 hours; last prior symptomatic episode about 4 hrs  Assoc Symptoms incl fatigue; presyncope and syncope      Date Cr K Hgb  3/20 0.75 6.1>>4.8 12.4  3/21 0.75 4.6 12.5   Elevated K was attributed to lisinopril ( thought we had stopped it last year 2/2 cough)  HCTZ started and repeat K As above    Echo 11/15 > normal LV function with mild LAE/ Pulm HTN   The patient denies symptoms of fevers, chills, cough, or new SOB worrisome for COVID 19.   Past Medical History:  Diagnosis Date  . Anemia    hx of with surgery   . Arthritis 07-27-11   osteoarthritis-hips/s/p bil. THA, arthritis right hand   . Blepharitis of left eye   . Blood transfusion feb 2014   post surgery some time ago  . Cancer ([place]) 1989   Breast cancer -s/p lt. mastectomy, some squamous cell lesions  . Cold hands   . Complication of anesthesia    epinephrine - screams uncontrollably / pt does not want  general anesthesia or narcotics  . Constipation   . Fuchs' corneal dystrophy    both eyes  . Hard of hearing    both ears mild loss  . Hyperkalemia 03/07/2018  . Hypertension   . Hypothyroidism 3-21- 13   tx. levothyroxine  . Lichen sclerosus et atrophicus of the vulva    pt reported.   . Osteopenia   . Stroke [facility])    a. s/p MDT LINQ 04/2014    Past Surgical History:  Procedure Laterality Date  . ABDOMINAL HYSTERECTOMY     ovaries removed  . ANKLE SURGERY  2005   right ankle tendon repair  . basal cell removed from face     3-4 times  . BREAST SURGERY  07-1987   lt.breast cancer intraductal cancer, mastectomy  . CATARACT EXTRACTION, BILATERAL  few yrs ago   Bilateral  . HARDWARE REMOVAL  04/05/2012   Procedure: HARDWARE REMOVAL;  Surgeon: Gearlean Alf, MD;  Location: [facility];  Service: Orthopedics;  Laterality: Right;  Removal of Hardware Right Femur   . HARDWARE REMOVAL Left 06/30/2013   Procedure: LEFT HIP HARDWARE REMOVAL OF CABLES;  Surgeon: Gearlean Alf, MD;  Location: [facility];  Service: Orthopedics;  Laterality: Left;  . HARDWARE REMOVAL Right 08/21/2013   Procedure: RIGHT HIP HARDWARE REMOVAL;  Surgeon: Gearlean Alf, MD;  Location: [facility];  Service: Orthopedics;  Laterality: Right;  . JOINT REPLACEMENT  07-27-11   Bilateral: Right THA - 04/2000, Left THA 06/2001  . left hip replacement Left feb 2003  . LOOP RECORDER IMPLANT  04-08-2014   MDT LINQ implanted by Dr Lovena Le for cryptogenic stroke  . LOOP RECORDER REMOVAL N/A 04/26/2017   Procedure: LOOP RECORDER REMOVAL;  Surgeon: Evans Lance, MD;  Location: [facility];  Service: Cardiovascular;  Laterality: N/A;  . MASTECTOMY  1989   left - Pt states has no restrictions on use of L arm for BP or blood draw  . melanoma removed Left    x 1  . ORIF FEMUR FRACTURE  07/31/2011   Procedure: OPEN REDUCTION INTERNAL FIXATION (ORIF) DISTAL FEMUR FRACTURE;  Surgeon: Gearlean Alf, MD;  Location: [facility];   Service: Orthopedics;  Laterality: Right;  right femur  (c-arm)   . SQUAMOUS CELL CARCINOMA EXCISION  07-27-11   x2 left shoulder  . TOTAL HIP REVISION  04/10/2012   Procedure: TOTAL HIP REVISION;  Surgeon: Gearlean Alf, MD;  Location: [facility];  Service: Orthopedics;  Laterality: Right;  . TOTAL HIP REVISION Left 06/27/2012   Procedure: LEFT TOTAL HIP REVISION;  Surgeon: Gearlean Alf, MD;  Location: [facility];  Service: Orthopedics;  Laterality: Left;    Current Outpatient Medications  Medication Sig Dispense Refill  . amLODipine (NORVASC) 5 MG tablet TAKE 1 TABLET(5 MG) BY MOUTH EVERY MORNING 90 tablet 1  . clobetasol ointment (TEMOVATE) 5.28 % Apply 1 application topically 2 (two) times daily as needed.     . Coenzyme Q10 (COQ-10 PO) Take 1 tablet by mouth daily.    Marland Kitchen docusate sodium (COLACE) 100 MG capsule Take 100 mg by mouth daily.    Marland Kitchen ELIQUIS 2.5 MG TABS tablet TAKE 1 TABLET TWICE DAILY 180 tablet 0  . hydrochlorothiazide (MICROZIDE) 12.5 MG capsule TAKE 1 CAPSULE(12.5 MG) BY MOUTH DAILY 90 capsule 1  . levothyroxine (SYNTHROID) 100 MCG tablet Take 1 tablet (100 mcg total) by mouth daily. 90 tablet 0  . loratadine (CLARITIN) 10 MG tablet Take 1 tablet (10 mg total) by mouth daily. 30 tablet 0  . Multiple Vitamins-Iron (MULTIVITAMINS WITH IRON) TABS tablet Take 1 tablet by mouth daily.     . Omega-3 Fatty Acids (FISH OIL PO) Take by mouth.    . Propylene Glycol (SYSTANE BALANCE OP) Apply 1 drop to eye daily.    . Vitamin D, Cholecalciferol, 1000 UNITS TABS Take 1,000 Units by mouth daily.      No current facility-administered medications for this visit.    Allergies:   Lactose intolerance (gi) and Epinephrine   Social History:  The patient  reports that she quit smoking about 56 years ago. Her smoking use included cigarettes. She has a 10.00 pack-year smoking history. She has never used smokeless tobacco. She reports current alcohol use of about 1.0 standard drink of alcohol per  week. She reports that she does not use drugs.   Family History:  The patient's   family history includes Arthritis in her sister; Cancer in her brother, father, and mother; Celiac disease in her mother; Heart disease in her paternal grandfather and paternal grandmother; Hodgkin's lymphoma in her mother; Melanoma in her father; Ulcerative colitis in her sister.   ROS:  Please see the history of present illness.   All other systems are personally reviewed and negative.    Exam:    Vital Signs:  BP 119/66   Pulse (!) 59   Ht 5\' 5"  (1.651 m)   Wt 122 lb (55.3 kg)   BMI 20.30 kg/m      Labs/Other Tests and Data Reviewed:    Recent Labs: 07/24/2019: ALT 14; BUN 13; Creatinine, Ser 0.75; Hemoglobin 12.5; Platelets 218; Potassium 4.6; Sodium 135; TSH 2.850   Wt Readings from Last 3 Encounters:  11/20/19 122 lb (55.3 kg)  09/25/19 124 lb 6.4 oz (56.4 kg)  07/24/19 123 lb 12.8 oz (56.2 kg)     Other studies personally reviewed: Additional studies/ records that were reviewed today include:As above     ASSESSMENT & PLAN:    Cryptogenic Stroke  Atrial fibrillation    Post termination pauses  ASym  Linq Monitor   Fatigue  Hypertension  Renal insufficiency grade 3  Will reduce BP meds-- decrease amlodipine 5 mg to avoid relative hypotension and wlll decrease to 2.5 mg for the two days prior to coming trip to [facility]  Discussed alternatives of dig( slows her already slow HR) Cardioversion ( for people with persistent afib) trying to avoid her HR slowing at night   Current medicines  Were not reviewed by me  Today, I have spent 17  minutes with the patient with telehealth technology discussing the above.  Signed, Virl Axe, MD  11/20/2019 1:59 PM     [address] [place] [address] [phone] (office) [phone] (fax)

## 2019-11-20 NOTE — Telephone Encounter (Signed)
  Patient Consent for Virtual Visit         [name] has provided verbal consent on 11/20/2019 for a virtual visit (video or telephone).   CONSENT FOR VIRTUAL VISIT FOR:  [name]  By participating in this virtual visit I agree to the following:  I hereby voluntarily request, consent and authorize [facility] and its employed or contracted physicians, physician assistants, nurse practitioners or other licensed health care professionals (the Practitioner), to provide me with telemedicine health care services (the "Services") as deemed necessary by the treating Practitioner. I acknowledge and consent to receive the Services by the Practitioner via telemedicine. I understand that the telemedicine visit will involve communicating with the Practitioner through live audiovisual communication technology and the disclosure of certain medical information by electronic transmission. I acknowledge that I have been given the opportunity to request an in-person assessment or other available alternative prior to the telemedicine visit and am voluntarily participating in the telemedicine visit.  I understand that I have the right to withhold or withdraw my consent to the use of telemedicine in the course of my care at any time, without affecting my right to future care or treatment, and that the Practitioner or I may terminate the telemedicine visit at any time. I understand that I have the right to inspect all information obtained and/or recorded in the course of the telemedicine visit and may receive copies of available information for a reasonable fee.  I understand that some of the potential risks of receiving the Services via telemedicine include:  Marland Kitchen Delay or interruption in medical evaluation due to technological equipment failure or disruption; . Information transmitted may not be sufficient (e.g. poor resolution of images) to allow for appropriate medical decision making by the Practitioner;  and/or  . In rare instances, security protocols could fail, causing a breach of personal health information.  Furthermore, I acknowledge that it is my responsibility to provide information about my medical history, conditions and care that is complete and accurate to the best of my ability. I acknowledge that Practitioner's advice, recommendations, and/or decision may be based on factors not within their control, such as incomplete or inaccurate data provided by me or distortions of diagnostic images or specimens that may result from electronic transmissions. I understand that the practice of medicine is not an exact science and that Practitioner makes no warranties or guarantees regarding treatment outcomes. I acknowledge that a copy of this consent can be made available to me via my patient portal ([facility]), or I can request a printed copy by calling the office of [facility].    I understand that my insurance will be billed for this visit.   I have read or had this consent read to me. . I understand the contents of this consent, which adequately explains the benefits and risks of the Services being provided via telemedicine.  . I have been provided ample opportunity to ask questions regarding this consent and the Services and have had my questions answered to my satisfaction. . I give my informed consent for the services to be provided through the use of telemedicine in my medical care

## 2019-11-25 ENCOUNTER — Ambulatory Visit: Payer: Medicare PPO | Admitting: Medical

## 2019-11-25 ENCOUNTER — Other Ambulatory Visit: Payer: Self-pay

## 2019-11-25 NOTE — Progress Notes (Signed)
Chief Complaint  Patient presents with  . Consult    has been having some sleeping issues-but did sleep throught the night last night, thinks she was just exhausted. Did have diarrhea today, not a whole lot a nausea. Headache that comes and goes. Walks daily and missed walk yesterday due to her system being off.     Patient presents with complaints of bloating, gas, nausea, headaches, just not feeling right.   "I feel lousy" "my whole system is thrown off".  She reports that she felt fine until Monday (2 days ago). She reports she ate something Sunday night (ground lamb, mixed with bulgar and farro)--nobody else sick, tasted fine. She doesn't usually eat yogurt, thought maybe it disagreed with her. That night she didn't sleep, waking up every couple of hours, needing to void, but not much came out. No pain with urination, hematuria. Urinary frequency persisted Monday night.  No problem last night only up once. She has had some lower abdominal cramping, mainly gas.  Started out as gas, but is now cramping. She took simethicone Monday, helped maybe a little.  Stools on Monday were hard, straining. Bowels were more formed/normal "lumpy" yesterday. This morning stool was watery, brown.  She denies any black or bloody stools.  +nausea.  Thought she would vomit yesterday, but never did.    Pt with atrial fibrillation.  She report that her heart has been in regular rhythm No LH/dizzy, no chest pain, palpitations. +headaches, at both temples.  No vision loss.    No fever, chills.  Patient's chart was reviewed-- She has atrial fibrillation, and saw Dr. Caryl Comes last week.  Amlodipine dose was decreased to 84m. She had a long episode of afib which prompted that visit. She remains on eliquis and denies bleeding.  She last saw Vickie for a med check in March (had more recent visit with concern of tremor).  At that time she was doing well, reported some "usual" muscle and joint aches, fatigue, mild  memory issues (forgetting names). She lives alone, denies falls. Daughter lives next door. She has afib, hypertension, hypothyroidism, chronic constipation, and h/o vitamin D deficiency. She had normal c-met, CBC and TSH in 07/2019. Last Vitamin D-OH was normal at 54.1 in 01/2019 Lab Results  Component Value Date   TSH 2.850 07/24/2019   Outpatient Encounter Medications as of 11/26/2019  Medication Sig  . amLODipine (NORVASC) 5 MG tablet TAKE 1 TABLET(5 MG) BY MOUTH EVERY MORNING  . Coenzyme Q10 (COQ-10 PO) Take 1 tablet by mouth daily.  .Marland Kitchendocusate sodium (COLACE) 100 MG capsule Take 100 mg by mouth daily.  .Marland KitchenELIQUIS 2.5 MG TABS tablet TAKE 1 TABLET TWICE DAILY  . hydrochlorothiazide (MICROZIDE) 12.5 MG capsule TAKE 1 CAPSULE(12.5 MG) BY MOUTH DAILY  . levothyroxine (SYNTHROID) 100 MCG tablet Take 1 tablet (100 mcg total) by mouth daily.  . Multiple Vitamins-Iron (MULTIVITAMINS WITH IRON) TABS tablet Take 1 tablet by mouth daily.   . Omega-3 Fatty Acids (FISH OIL PO) Take 2 capsules by mouth.   . Propylene Glycol (SYSTANE BALANCE OP) Apply 1 drop to eye daily.  . Vitamin D, Cholecalciferol, 1000 UNITS TABS Take 1,000 Units by mouth daily.   . clobetasol ointment (TEMOVATE) 05.05% Apply 1 application topically 2 (two) times daily as needed.  (Patient not taking: Reported on 11/26/2019)  . [DISCONTINUED] loratadine (CLARITIN) 10 MG tablet Take 1 tablet (10 mg total) by mouth daily. (Patient not taking: Reported on 11/26/2019)   No facility-administered encounter  medications on file as of 11/26/2019.   Allergies  Allergen Reactions  . Lactose Intolerance (Gi)     Bloating, gas  . Epinephrine Other (See Comments)     Reaction:  Caused pt to scream uncontrollably.   ROS: no fever, chills, URI symptoms, chest pain, palpitations, dizziness.  +decreased appetite, nausea, abdominal bloating/cramping, diarrhea this morning, per HPI.  Urinary frequency x 2 nights, better last night.  No other urinary  complaints.  No bleeding, bruising, rash.  No other concerns except as noted in HPI.   PHYSICAL EXAM:  BP 110/60   Pulse (!) 56   Temp 98.6 F (37 C) (Tympanic)   Ht _0  (1.651 m)   Wt 125 lb 3.2 oz (56.8 kg)   BMI 20.83 kg/m   Wt Readings from Last 3 Encounters:  11/26/19 125 lb 3.2 oz (56.8 kg)  11/20/19 122 lb (55.3 kg)  09/25/19 124 lb 6.4 oz (56.4 kg)   Elderly female, thin, in no acute distress. HEENT: conjunctiva and sclera are clear, EOMI. Wearing hearing aids. OP mouth is slightly dry, otherwise normal, no erythema or lesions Neck: no lymphadenopathy or mass Heart: regular rhythm, mildly bradycardic Lungs: clear bilaterally Back: no spinal or CVA tenderness Abdomen: soft, active bowel sounds. No focal tenderness or mass Extremities: no edema Skin: no rashes Neuro: alert and oriented.  Somewhat unusual gait (I'm unsure of her baseline, might be different/slower/more cautious due to feeling poorly).  No focal weakness.  Urine dip: SG 1.015, trace blood, trace protein otherwise negative   ASSESSMENT/PLAN:  Fatigue, unspecified type - unclear etiology.  Poss viral syndrome/AGE, but given age and underlying conditions, could be more serious. Declines labs today, rec if not improving - Plan: POCT Urinalysis DIP (Proadvantage Device)  Diarrhea, unspecified type - pt with h/o constipation, now with some diarrhea, bloating, nausea. Ddx discussed, including poss virus. Hydration, BRAT diet reviewed - Plan: POCT Urinalysis DIP (Proadvantage Device)  Paroxysmal atrial fibrillation ([facility]) - denies recent bouts  Anticoagulant long-term use - denies any bleeding  Hypothyroidism, unspecified type - reports compliance with meds, TSH in March normal  Urinary frequency - x 2 nights, resolved.  urine doesn't suggest infection   Per chart review-- Thyroid med last filled x 90d in 06/2019--she reports compliance, and that she has a "fairly new bottle"  She has appt 9/20 with  Vickie  TSH, c-met, CBC, urine dip recommended. She was willing (and orders entered), but then she changed mind--wants to hold off on labs, and reports that she will return next week if not better for labs and re-evaluation.   Drink plenty of water. You look slightly dehydrated today (and take a diuretic). Don't take your stool softener today (while having diarrhea). Avoid dairy. Eat a bland diet--clears, "BRAT" diet--bananas, rice, applesauce and toast Consider taking a probiotic daily, to help re-set the normal intestinal flora.  Return next week for lab work and re-evaluation if you aren't feeling better.

## 2019-11-26 ENCOUNTER — Encounter: Payer: Self-pay | Admitting: Family Medicine

## 2019-11-26 ENCOUNTER — Ambulatory Visit: Payer: Medicare PPO | Admitting: Family Medicine

## 2019-11-26 VITALS — BP 110/60 | HR 56 | Temp 98.6°F | Ht 65.0 in | Wt 125.2 lb

## 2019-11-26 DIAGNOSIS — E039 Hypothyroidism, unspecified: Secondary | ICD-10-CM | POA: Diagnosis not present

## 2019-11-26 DIAGNOSIS — R5383 Other fatigue: Secondary | ICD-10-CM

## 2019-11-26 DIAGNOSIS — R350 Frequency of micturition: Secondary | ICD-10-CM | POA: Diagnosis not present

## 2019-11-26 DIAGNOSIS — Z7901 Long term (current) use of anticoagulants: Secondary | ICD-10-CM

## 2019-11-26 DIAGNOSIS — R197 Diarrhea, unspecified: Secondary | ICD-10-CM

## 2019-11-26 DIAGNOSIS — I480 Paroxysmal atrial fibrillation: Secondary | ICD-10-CM | POA: Diagnosis not present

## 2019-11-26 LAB — POCT URINALYSIS DIP (PROADVANTAGE DEVICE)
Bilirubin, UA: NEGATIVE
Glucose, UA: NEGATIVE mg/dL
Ketones, POC UA: NEGATIVE mg/dL
Leukocytes, UA: NEGATIVE
Nitrite, UA: NEGATIVE
Specific Gravity, Urine: 1.015
Urobilinogen, Ur: NEGATIVE
pH, UA: 6 (ref 5.0–8.0)

## 2019-11-26 NOTE — Patient Instructions (Signed)
  Drink plenty of water. You look slightly dehydrated today (and take a diuretic).  Don't take your stool softener today (while having diarrhea). Avoid dairy. Eat a bland diet--clears, "BRAT" diet--bananas, rice, applesauce and toast Consider taking a probiotic daily, to help re-set the normal intestinal flora.  Return next week for lab work and re-evaluation if you aren't feeling better.

## 2019-11-27 ENCOUNTER — Ambulatory Visit: Payer: Medicare PPO | Admitting: Family Medicine

## 2019-11-27 ENCOUNTER — Encounter: Payer: Self-pay | Admitting: Family Medicine

## 2019-11-27 VITALS — BP 130/70 | HR 72 | Temp 101.9°F | Ht 65.0 in | Wt 126.8 lb

## 2019-11-27 DIAGNOSIS — R55 Syncope and collapse: Secondary | ICD-10-CM

## 2019-11-27 DIAGNOSIS — R509 Fever, unspecified: Secondary | ICD-10-CM | POA: Diagnosis not present

## 2019-11-27 DIAGNOSIS — Z7901 Long term (current) use of anticoagulants: Secondary | ICD-10-CM | POA: Diagnosis not present

## 2019-11-27 DIAGNOSIS — Z5181 Encounter for therapeutic drug level monitoring: Secondary | ICD-10-CM

## 2019-11-27 DIAGNOSIS — R5383 Other fatigue: Secondary | ICD-10-CM | POA: Diagnosis not present

## 2019-11-27 NOTE — Progress Notes (Signed)
Chief Complaint  Patient presents with  . Fatigue    worse today. Tried [employer], had some rice, cabbage and carrots. Not too much nasuea today or diarrhea. Just does not feel good. Wants to be tested for COVID-doesn't know what else to do.    Patient presents for follow-up, as she is feeling worse today, now having fever of 102.  She is accompanied by her daughter today.  She had only 1 episode of diarrhea in the morning yesterday. Only a little today. "I feel dry in my mouth" no matter how much she drinks. Drinking water and gatorade. Following BRAT diet Fever and chills started today.  She complains that her stomach feels full all the time.  Denies any focal tenderness or pain, just a full sensation.  She reports that she found herself on the floor next to her bed this morning. Doesn't recall falling or recollection of passing out, just found herself on the ground.  She denies any pain or injury. Denies any recent atrial fib that she is aware of (that has made her feel dizzy in the past).  She is belching a little more than her usual.  Denies nausea. +Decreased appetite. She is asking for COVID test. She has upcoming trip planned and is worried about this.  PMH, PSH, SH reviewed  Outpatient Encounter Medications as of 11/27/2019  Medication Sig  . amLODipine (NORVASC) 5 MG tablet TAKE 1 TABLET(5 MG) BY MOUTH EVERY MORNING  . Coenzyme Q10 (COQ-10 PO) Take 1 tablet by mouth daily.  Marland Kitchen docusate sodium (COLACE) 100 MG capsule Take 100 mg by mouth daily.  Marland Kitchen ELIQUIS 2.5 MG TABS tablet TAKE 1 TABLET TWICE DAILY  . hydrochlorothiazide (MICROZIDE) 12.5 MG capsule TAKE 1 CAPSULE(12.5 MG) BY MOUTH DAILY  . levothyroxine (SYNTHROID) 100 MCG tablet Take 1 tablet (100 mcg total) by mouth daily.  . Multiple Vitamins-Iron (MULTIVITAMINS WITH IRON) TABS tablet Take 1 tablet by mouth daily.   . Omega-3 Fatty Acids (FISH OIL PO) Take 2 capsules by mouth.   . Probiotic Product (PROBIOTIC PO) Take 1  capsule by mouth daily.  Marland Kitchen Propylene Glycol (SYSTANE BALANCE OP) Apply 1 drop to eye daily.  . Vitamin D, Cholecalciferol, 1000 UNITS TABS Take 1,000 Units by mouth daily.   . clobetasol ointment (TEMOVATE) 5.17 % Apply 1 application topically 2 (two) times daily as needed.  (Patient not taking: Reported on 11/26/2019)   No facility-administered encounter medications on file as of 11/27/2019.   Allergies  Allergen Reactions  . Lactose Intolerance (Gi)     Bloating, gas  . Epinephrine Other (See Comments)     Reaction:  Caused pt to scream uncontrollably.   ROS:  Fever/chills per HPI.  No URI sx, cough, shortness of breath or chest pain.  +decreased appetite.  Slight diarrhea.  No black or bloody stools. No dysuria, hematuria, and no longer having any frequency.  No bleeding, rash. +fatigue. Denies focal neuro sx.   PHYSICAL EXAM:   BP (!) 130/70   Pulse 72   Temp (!) 101.9 F (38.8 C) (Tympanic)   Ht 5' 5"  (1.651 m)   Wt 126 lb 12.8 oz (57.5 kg)   BMI 21.10 kg/m   Wt Readings from Last 3 Encounters:  11/27/19 126 lb 12.8 oz (57.5 kg)  11/26/19 125 lb 3.2 oz (56.8 kg)  11/20/19 122 lb (55.3 kg)   Elderly female, slow-moving, appears to move very cautiously, frail/weak.  She is alert and oriented. HEENT: There is  a small abrasion to left lateral eye, overlying bone, no active bleeding or significant bruising. She is nontender around orbit and elsewhere on head.  PERRL, EOMI, conjunctiva and sclera are clear.   TM's and EAC's normal. Sinuses nontender. OP is clear, moist mucus membranes Neck: no lymphadenopathy or mass Heart: regular rate and rhythm Lungs: clear bilaterally Abdomen: active bowel sounds, soft, nontender Extremities: no edema   ASSESSMENT/PLAN:  Fatigue, unspecified type - worsening, related to current illness--?viral vs more serious etiology. Now willing to get labs (declined yesterday) - Plan: CBC with Differential/Platelet, Comprehensive metabolic  panel  Anticoagulant long-term use - afib, anticoagulated. check CBC for anemia.  - Plan: CBC with Differential/Platelet  Fever, unspecified fever cause - per yesterday's visit, suspected viral syndrome. Pt now febrile, appears more frail. Check labs--may need add'l eval (ER, imaging) depending on results - Plan: CBC with Differential/Platelet, Novel Coronavirus, NAA ([facility])  Medication monitoring encounter - Plan: CBC with Differential/Platelet, Comprehensive metabolic panel  Syncope, unspecified syncope type - possibly--found self on floor, appears to have minor cut to L eye.  Nonfocal neuro exam.   CBC, c-met TSH normal recently--not going to recheck today due to her illness which can sometimes affect.  Doubt thyroid the cause of her sx.  Discussed with pt and daughter-- Fever control, s/sx dehydration. I'm concerned about poss syncope--advised to go to ER if any neuro sx develop, especially due to her being on blood thinners. All questions answered. COVID test performed per pt request, advised results will take 2-3d.

## 2019-11-27 NOTE — Patient Instructions (Signed)
Use tylenol as needed to keep your fever down. You were given a dose in the office, and need to wait 4 hours (at least) before another dose).  Stay well hydrated.  I'm concerned that you have passed out--finding yourself on the floor, and having the injury to your left eye. If you have any ongoing dizziness/lightheadedness, if you develop severe headache, or any neurologic symptoms (confusion, trouble thinking, speaking, walking, etc), then I want you to go to the emergency room for further evaluation.  Please move slowly, carefully, try and avoid falls.  You don't need to follow the BRAT diet if your diarrhea has improved.  But I would stay with a bland diet.

## 2019-11-28 ENCOUNTER — Emergency Department (HOSPITAL_COMMUNITY): Payer: Medicare PPO

## 2019-11-28 ENCOUNTER — Encounter: Payer: Self-pay | Admitting: Family Medicine

## 2019-11-28 ENCOUNTER — Inpatient Hospital Stay (HOSPITAL_COMMUNITY)
Admission: EM | Admit: 2019-11-28 | Discharge: 2019-12-01 | DRG: 809 | Disposition: A | Discharge status: Home Health | Payer: Medicare PPO | Attending: Family Medicine | Admitting: Family Medicine

## 2019-11-28 ENCOUNTER — Encounter (HOSPITAL_COMMUNITY): Payer: Self-pay

## 2019-11-28 ENCOUNTER — Other Ambulatory Visit: Payer: Self-pay

## 2019-11-28 DIAGNOSIS — Z8261 Family history of arthritis: Secondary | ICD-10-CM

## 2019-11-28 DIAGNOSIS — E871 Hypo-osmolality and hyponatremia: Secondary | ICD-10-CM

## 2019-11-28 DIAGNOSIS — A419 Sepsis, unspecified organism: Secondary | ICD-10-CM | POA: Diagnosis not present

## 2019-11-28 DIAGNOSIS — Z87891 Personal history of nicotine dependence: Secondary | ICD-10-CM

## 2019-11-28 DIAGNOSIS — E039 Hypothyroidism, unspecified: Secondary | ICD-10-CM | POA: Diagnosis present

## 2019-11-28 DIAGNOSIS — J9811 Atelectasis: Secondary | ICD-10-CM | POA: Diagnosis present

## 2019-11-28 DIAGNOSIS — Z8673 Personal history of transient ischemic attack (TIA), and cerebral infarction without residual deficits: Secondary | ICD-10-CM

## 2019-11-28 DIAGNOSIS — I10 Essential (primary) hypertension: Secondary | ICD-10-CM | POA: Diagnosis not present

## 2019-11-28 DIAGNOSIS — Z20822 Contact with and (suspected) exposure to covid-19: Secondary | ICD-10-CM | POA: Diagnosis not present

## 2019-11-28 DIAGNOSIS — Z79890 Hormone replacement therapy (postmenopausal): Secondary | ICD-10-CM

## 2019-11-28 DIAGNOSIS — M19041 Primary osteoarthritis, right hand: Secondary | ICD-10-CM | POA: Diagnosis present

## 2019-11-28 DIAGNOSIS — R197 Diarrhea, unspecified: Secondary | ICD-10-CM

## 2019-11-28 DIAGNOSIS — H9193 Unspecified hearing loss, bilateral: Secondary | ICD-10-CM | POA: Diagnosis present

## 2019-11-28 DIAGNOSIS — Z7901 Long term (current) use of anticoagulants: Secondary | ICD-10-CM

## 2019-11-28 DIAGNOSIS — Z9012 Acquired absence of left breast and nipple: Secondary | ICD-10-CM

## 2019-11-28 DIAGNOSIS — Z91011 Allergy to milk products: Secondary | ICD-10-CM

## 2019-11-28 DIAGNOSIS — E079 Disorder of thyroid, unspecified: Secondary | ICD-10-CM | POA: Diagnosis present

## 2019-11-28 DIAGNOSIS — R5081 Fever presenting with conditions classified elsewhere: Secondary | ICD-10-CM | POA: Diagnosis not present

## 2019-11-28 DIAGNOSIS — D72819 Decreased white blood cell count, unspecified: Secondary | ICD-10-CM | POA: Diagnosis not present

## 2019-11-28 DIAGNOSIS — M160 Bilateral primary osteoarthritis of hip: Secondary | ICD-10-CM | POA: Diagnosis present

## 2019-11-28 DIAGNOSIS — G9332 Myalgic encephalomyelitis/chronic fatigue syndrome: Secondary | ICD-10-CM

## 2019-11-28 DIAGNOSIS — Z853 Personal history of malignant neoplasm of breast: Secondary | ICD-10-CM

## 2019-11-28 DIAGNOSIS — D696 Thrombocytopenia, unspecified: Secondary | ICD-10-CM | POA: Diagnosis not present

## 2019-11-28 DIAGNOSIS — M8580 Other specified disorders of bone density and structure, unspecified site: Secondary | ICD-10-CM | POA: Diagnosis present

## 2019-11-28 DIAGNOSIS — I480 Paroxysmal atrial fibrillation: Secondary | ICD-10-CM | POA: Diagnosis not present

## 2019-11-28 DIAGNOSIS — R509 Fever, unspecified: Secondary | ICD-10-CM | POA: Diagnosis not present

## 2019-11-28 DIAGNOSIS — E876 Hypokalemia: Secondary | ICD-10-CM | POA: Diagnosis present

## 2019-11-28 DIAGNOSIS — R531 Weakness: Secondary | ICD-10-CM

## 2019-11-28 DIAGNOSIS — Z808 Family history of malignant neoplasm of other organs or systems: Secondary | ICD-10-CM

## 2019-11-28 DIAGNOSIS — E861 Hypovolemia: Secondary | ICD-10-CM | POA: Diagnosis not present

## 2019-11-28 DIAGNOSIS — D709 Neutropenia, unspecified: Secondary | ICD-10-CM | POA: Diagnosis not present

## 2019-11-28 DIAGNOSIS — Z79899 Other long term (current) drug therapy: Secondary | ICD-10-CM

## 2019-11-28 DIAGNOSIS — Z8249 Family history of ischemic heart disease and other diseases of the circulatory system: Secondary | ICD-10-CM

## 2019-11-28 DIAGNOSIS — Z8379 Family history of other diseases of the digestive system: Secondary | ICD-10-CM

## 2019-11-28 DIAGNOSIS — Z888 Allergy status to other drugs, medicaments and biological substances status: Secondary | ICD-10-CM

## 2019-11-28 DIAGNOSIS — I700 Atherosclerosis of aorta: Secondary | ICD-10-CM | POA: Diagnosis present

## 2019-11-28 DIAGNOSIS — Z807 Family history of other malignant neoplasms of lymphoid, hematopoietic and related tissues: Secondary | ICD-10-CM

## 2019-11-28 DIAGNOSIS — K7689 Other specified diseases of liver: Secondary | ICD-10-CM | POA: Diagnosis not present

## 2019-11-28 LAB — CBC WITH DIFFERENTIAL/PLATELET
Abs Immature Granulocytes: 0.01 10*3/uL (ref 0.00–0.07)
Basophils Absolute: 0 10*3/uL (ref 0.0–0.2)
Basophils Absolute: 0 10*3/uL (ref 0.0–0.1)
Basophils Relative: 1 %
Basos: 1 %
EOS (ABSOLUTE): 0 10*3/uL (ref 0.0–0.4)
Eos: 0 %
Eosinophils Absolute: 0 10*3/uL (ref 0.0–0.5)
Eosinophils Relative: 1 %
HCT: 36.1 % (ref 36.0–46.0)
Hematocrit: 30.6 % — ABNORMAL LOW (ref 34.0–46.6)
Hemoglobin: 11 g/dL — ABNORMAL LOW (ref 11.1–15.9)
Hemoglobin: 12.1 g/dL (ref 12.0–15.0)
Immature Grans (Abs): 0 10*3/uL (ref 0.0–0.1)
Immature Granulocytes: 1 %
Immature Granulocytes: 1 %
Lymphocytes Absolute: 0.3 10*3/uL — ABNORMAL LOW (ref 0.7–3.1)
Lymphocytes Relative: 31 %
Lymphs Abs: 0.6 10*3/uL — ABNORMAL LOW (ref 0.7–4.0)
Lymphs: 18 %
MCH: 32.3 pg (ref 26.6–33.0)
MCH: 31.1 pg (ref 26.0–34.0)
MCHC: 35.9 g/dL — ABNORMAL HIGH (ref 31.5–35.7)
MCHC: 33.5 g/dL (ref 30.0–36.0)
MCV: 90 fL (ref 79–97)
MCV: 92.8 fL (ref 80.0–100.0)
Monocytes Absolute: 0.2 10*3/uL (ref 0.1–0.9)
Monocytes Absolute: 0.2 10*3/uL (ref 0.1–1.0)
Monocytes Relative: 10 %
Monocytes: 11 %
Neutro Abs: 1.1 10*3/uL — ABNORMAL LOW (ref 1.7–7.7)
Neutrophils Absolute: 1.3 10*3/uL — ABNORMAL LOW (ref 1.4–7.0)
Neutrophils Relative %: 56 %
Neutrophils: 69 %
Platelets: 41 10*3/uL — ABNORMAL LOW (ref 150–400)
RBC: 3.41 x10E6/uL — ABNORMAL LOW (ref 3.77–5.28)
RBC: 3.89 MIL/uL (ref 3.87–5.11)
RDW: 14.4 % (ref 11.7–15.4)
RDW: 14.6 % (ref 11.5–15.5)
WBC: 1.8 10*3/uL — CL (ref 3.4–10.8)
WBC: 1.9 10*3/uL — ABNORMAL LOW (ref 4.0–10.5)
nRBC: 0 % (ref 0.0–0.2)

## 2019-11-28 LAB — COMPREHENSIVE METABOLIC PANEL
ALT: 30 IU/L (ref 0–32)
ALT: 37 U/L (ref 0–44)
AST: 51 IU/L — ABNORMAL HIGH (ref 0–40)
AST: 59 U/L — ABNORMAL HIGH (ref 15–41)
Albumin/Globulin Ratio: 2 (ref 1.2–2.2)
Albumin: 3.6 g/dL (ref 3.6–4.6)
Albumin: 3.8 g/dL (ref 3.5–5.0)
Alkaline Phosphatase: 44 IU/L — ABNORMAL LOW (ref 48–121)
Alkaline Phosphatase: 42 U/L (ref 38–126)
Anion gap: 10 (ref 5–15)
BUN/Creatinine Ratio: 14 (ref 12–28)
BUN: 10 mg/dL (ref 8–27)
BUN: 14 mg/dL (ref 8–23)
Bilirubin Total: 0.4 mg/dL (ref 0.0–1.2)
CO2: 23 mmol/L (ref 20–29)
CO2: 25 mmol/L (ref 22–32)
Calcium: 7.9 mg/dL — ABNORMAL LOW (ref 8.7–10.3)
Calcium: 8.2 mg/dL — ABNORMAL LOW (ref 8.9–10.3)
Chloride: 87 mmol/L — ABNORMAL LOW (ref 96–106)
Chloride: 87 mmol/L — ABNORMAL LOW (ref 98–111)
Creatinine, Ser: 0.7 mg/dL (ref 0.57–1.00)
Creatinine, Ser: 0.65 mg/dL (ref 0.44–1.00)
GFR calc Af Amer: 91 mL/min/{1.73_m2} (ref >59)
GFR calc Af Amer: >60 mL/min (ref >60)
GFR calc non Af Amer: 79 mL/min/{1.73_m2} (ref >59)
GFR calc non Af Amer: >60 mL/min (ref >60)
Globulin, Total: 1.8 g/dL (ref 1.5–4.5)
Glucose, Bld: 110 mg/dL — ABNORMAL HIGH (ref 70–99)
Glucose: 120 mg/dL — ABNORMAL HIGH (ref 65–99)
Potassium: 3.6 mmol/L (ref 3.5–5.2)
Potassium: 3.1 mmol/L — ABNORMAL LOW (ref 3.5–5.1)
Sodium: 121 mmol/L — ABNORMAL LOW (ref 134–144)
Sodium: 122 mmol/L — ABNORMAL LOW (ref 135–145)
Total Bilirubin: 0.7 mg/dL (ref 0.3–1.2)
Total Protein: 5.4 g/dL — ABNORMAL LOW (ref 6.0–8.5)
Total Protein: 6 g/dL — ABNORMAL LOW (ref 6.5–8.1)

## 2019-11-28 LAB — URINALYSIS, ROUTINE W REFLEX MICROSCOPIC
Bacteria, UA: NONE SEEN
Bilirubin Urine: NEGATIVE
Glucose, UA: NEGATIVE mg/dL
Ketones, ur: NEGATIVE mg/dL
Leukocytes,Ua: NEGATIVE
Nitrite: NEGATIVE
Protein, ur: NEGATIVE mg/dL
Specific Gravity, Urine: 1.014 (ref 1.005–1.030)
pH: 7 (ref 5.0–8.0)

## 2019-11-28 LAB — HEPATITIS PANEL, ACUTE
HCV Ab: NONREACTIVE
Hep A IgM: NONREACTIVE
Hep B C IgM: NONREACTIVE
Hepatitis B Surface Ag: NONREACTIVE

## 2019-11-28 LAB — BASIC METABOLIC PANEL
Anion gap: 10 (ref 5–15)
BUN: 11 mg/dL (ref 8–23)
CO2: 26 mmol/L (ref 22–32)
Calcium: 8 mg/dL — ABNORMAL LOW (ref 8.9–10.3)
Chloride: 92 mmol/L — ABNORMAL LOW (ref 98–111)
Creatinine, Ser: 0.53 mg/dL (ref 0.44–1.00)
GFR calc Af Amer: >60 mL/min (ref >60)
GFR calc non Af Amer: >60 mL/min (ref >60)
Glucose, Bld: 101 mg/dL — ABNORMAL HIGH (ref 70–99)
Potassium: 3.6 mmol/L (ref 3.5–5.1)
Sodium: 128 mmol/L — ABNORMAL LOW (ref 135–145)

## 2019-11-28 LAB — DIC (DISSEMINATED INTRAVASCULAR COAGULATION)PANEL
D-Dimer, Quant: 2.83 ug/mL-FEU — ABNORMAL HIGH (ref 0.00–0.50)
Fibrinogen: 320 mg/dL (ref 210–475)
INR: 1.1 (ref 0.8–1.2)
Platelets: 39 10*3/uL — ABNORMAL LOW (ref 150–400)
Prothrombin Time: 14 seconds (ref 11.4–15.2)
Smear Review: NONE SEEN
aPTT: 35 seconds (ref 24–36)

## 2019-11-28 LAB — APTT: aPTT: 35 seconds (ref 24–36)

## 2019-11-28 LAB — FOLATE: Folate: 31.3 ng/mL (ref >5.9)

## 2019-11-28 LAB — VITAMIN B12: Vitamin B-12: 840 pg/mL (ref 180–914)

## 2019-11-28 LAB — CBG MONITORING, ED: Glucose-Capillary: 95 mg/dL (ref 70–99)

## 2019-11-28 LAB — NOVEL CORONAVIRUS, NAA: SARS-CoV-2, NAA: NOT DETECTED

## 2019-11-28 LAB — SAVE SMEAR(SSMR), FOR PROVIDER SLIDE REVIEW

## 2019-11-28 LAB — PROTIME-INR
INR: 1.2 (ref 0.8–1.2)
Prothrombin Time: 14.6 seconds (ref 11.4–15.2)

## 2019-11-28 LAB — LACTIC ACID, PLASMA: Lactic Acid, Venous: 1.1 mmol/L (ref 0.5–1.9)

## 2019-11-28 LAB — SARS CORONAVIRUS 2 BY RT PCR (HOSPITAL ORDER, PERFORMED IN ~~LOC~~ HOSPITAL LAB): SARS Coronavirus 2: NEGATIVE

## 2019-11-28 LAB — SARS-COV-2, NAA 2 DAY TAT

## 2019-11-28 MED ORDER — POTASSIUM CHLORIDE 10 MEQ/100ML IV SOLN
10.0000 meq | INTRAVENOUS | Status: AC
  Administered 2019-11-28 (×2): 10 meq via INTRAVENOUS
  Filled 2019-11-28 (×2): qty 100

## 2019-11-28 MED ORDER — SODIUM CHLORIDE 0.9 % IV SOLN
2.0000 g | Freq: Once | INTRAVENOUS | Status: AC
  Administered 2019-11-28: 2 g via INTRAVENOUS
  Filled 2019-11-28: qty 2

## 2019-11-28 MED ORDER — LACTATED RINGERS IV BOLUS (SEPSIS)
1000.0000 mL | Freq: Once | INTRAVENOUS | Status: AC
  Administered 2019-11-28: 1000 mL via INTRAVENOUS

## 2019-11-28 MED ORDER — LEVOTHYROXINE SODIUM 100 MCG PO TABS
100.0000 ug | ORAL_TABLET | Freq: Every day | ORAL | Status: DC
  Administered 2019-11-29 – 2019-12-01 (×3): 100 ug via ORAL
  Filled 2019-11-28 (×4): qty 1

## 2019-11-28 MED ORDER — SODIUM CHLORIDE 0.9% FLUSH
3.0000 mL | Freq: Two times a day (BID) | INTRAVENOUS | Status: DC
  Administered 2019-11-28 – 2019-12-01 (×7): 3 mL via INTRAVENOUS

## 2019-11-28 MED ORDER — SODIUM CHLORIDE (PF) 0.9 % IJ SOLN
INTRAMUSCULAR | Status: AC
  Filled 2019-11-28: qty 50

## 2019-11-28 MED ORDER — POLYVINYL ALCOHOL 1.4 % OP SOLN
1.0000 [drp] | Freq: Two times a day (BID) | OPHTHALMIC | Status: DC
  Administered 2019-11-29 – 2019-11-30 (×4): 1 [drp] via OPHTHALMIC
  Filled 2019-11-28 (×2): qty 15

## 2019-11-28 MED ORDER — METRONIDAZOLE IN NACL 5-0.79 MG/ML-% IV SOLN
500.0000 mg | Freq: Once | INTRAVENOUS | Status: AC
  Administered 2019-11-28: 500 mg via INTRAVENOUS
  Filled 2019-11-28: qty 100

## 2019-11-28 MED ORDER — DOXYCYCLINE HYCLATE 100 MG PO TABS
100.0000 mg | ORAL_TABLET | Freq: Two times a day (BID) | ORAL | Status: DC
  Administered 2019-11-28 – 2019-12-01 (×6): 100 mg via ORAL
  Filled 2019-11-28 (×6): qty 1

## 2019-11-28 MED ORDER — IOHEXOL 300 MG/ML  SOLN
100.0000 mL | Freq: Once | INTRAMUSCULAR | Status: AC | PRN
  Administered 2019-11-28: 100 mL via INTRAVENOUS

## 2019-11-28 MED ORDER — ACETAMINOPHEN 650 MG RE SUPP: 650.0000 mg | Freq: Four times a day (QID) | RECTAL | Status: DC | PRN

## 2019-11-28 MED ORDER — ACETAMINOPHEN 325 MG PO TABS
650.0000 mg | ORAL_TABLET | Freq: Four times a day (QID) | ORAL | Status: DC | PRN
  Administered 2019-11-28: 650 mg via ORAL
  Filled 2019-11-28 (×2): qty 2

## 2019-11-28 MED ORDER — SODIUM CHLORIDE 0.9 % IV SOLN
2.0000 g | Freq: Two times a day (BID) | INTRAVENOUS | Status: DC
  Administered 2019-11-29 – 2019-12-01 (×5): 2 g via INTRAVENOUS
  Filled 2019-11-28 (×6): qty 2

## 2019-11-28 MED ORDER — SODIUM CHLORIDE 0.9% FLUSH
3.0000 mL | Freq: Once | INTRAVENOUS | Status: AC
  Administered 2019-11-28: 3 mL via INTRAVENOUS

## 2019-11-28 MED ORDER — AMLODIPINE BESYLATE 5 MG PO TABS
5.0000 mg | ORAL_TABLET | Freq: Every day | ORAL | Status: DC
  Administered 2019-11-29 – 2019-12-01 (×3): 5 mg via ORAL
  Filled 2019-11-28 (×4): qty 1

## 2019-11-28 MED ORDER — LACTATED RINGERS IV BOLUS (SEPSIS)
250.0000 mL | Freq: Once | INTRAVENOUS | Status: AC
  Administered 2019-11-28: 250 mL via INTRAVENOUS

## 2019-11-28 MED ORDER — SODIUM CHLORIDE 0.9 % IV SOLN: INTRAVENOUS | Status: DC

## 2019-11-28 MED ORDER — PROPYLENE GLYCOL 0.6 % OP SOLN: 1.0000 [drp] | Freq: Two times a day (BID) | OPHTHALMIC | Status: DC

## 2019-11-28 MED ORDER — VANCOMYCIN HCL IN DEXTROSE 1-5 GM/200ML-% IV SOLN
1000.0000 mg | Freq: Once | INTRAVENOUS | Status: AC
  Administered 2019-11-28: 1000 mg via INTRAVENOUS
  Filled 2019-11-28: qty 200

## 2019-11-28 MED ORDER — SODIUM CHLORIDE 0.9 % IV SOLN
2.0000 g | Freq: Three times a day (TID) | INTRAVENOUS | Status: DC
  Administered 2019-11-28: 2 g via INTRAVENOUS
  Filled 2019-11-28: qty 2

## 2019-11-28 MED ORDER — METOPROLOL TARTRATE 5 MG/5ML IV SOLN: 5.0000 mg | Freq: Four times a day (QID) | INTRAVENOUS | Status: DC | PRN

## 2019-11-28 MED ORDER — LACTATED RINGERS IV BOLUS (SEPSIS)
500.0000 mL | Freq: Once | INTRAVENOUS | Status: AC
  Administered 2019-11-28: 500 mL via INTRAVENOUS

## 2019-11-28 NOTE — ED Triage Notes (Signed)
Patient reports to the ER for abnormal lab and weakness. Patient reports feeling weakness and "awful" since Monday. Patient reports some diarrhea, denies N/V, Chest pain, SOB.

## 2019-11-28 NOTE — Progress Notes (Signed)
A consult was received from an ED physician for vanc/cefepime per pharmacy dosing.  The patient's profile has been reviewed for ht/wt/allergies/indication/available labs.   A one time order has been placed for vanc 1g and cefepime 2g.  Further antibiotics/pharmacy consults should be ordered by admitting physician if indicated.                       Thank you, Kara Mead 11/28/2019  10:30 AM

## 2019-11-28 NOTE — ED Provider Notes (Signed)
[facility] DEPT Provider Note   CSN: [id number] Arrival date & time: 11/28/19  4098     History Chief Complaint  Patient presents with  . Weakness  . Abnormal Lab    [name] is a 86 y.o. female with history of HTN, A.fib on Eliquis, hx of breast cancer currently in remission, who presents with fatigue and abnormal labs. Pt states she started feeling bad on Monday. She started to have belching, nausea, and a headache. Over the next couple of days she started to run a fever of 102 with associated diarrhea. She states the diarrhea is "not that bad" - she is having about 2 episodes a day. She denies blood in the stool. She denies abdominal pain but has had abdominal bloating. She reports decreased urine output and it's been dark. She saw her doctor on Wednesday due to fatigue and diarrhea. She was advised to follow BRAT diet and increase her fluids. She went back yesterday and had labs and COVID test done. They called her this morning and told her her sodium was low (121), Ca was low (7.9), and she is neutropenic (WBC 1.8). She reports a fever last night as well. Of note her COVID test yesterday was negative.  HPI     Past Medical History:  Diagnosis Date  . Anemia    hx of with surgery   . Arthritis 07-27-11   osteoarthritis-hips/s/p bil. THA, arthritis right hand   . Blepharitis of left eye   . Blood transfusion feb 2014   post surgery some time ago  . Cancer ([place]) 1989   Breast cancer -s/p lt. mastectomy, some squamous cell lesions  . Cold hands   . Complication of anesthesia    epinephrine - screams uncontrollably / pt does not want general anesthesia or narcotics  . Constipation   . Fuchs' corneal dystrophy    both eyes  . Hard of hearing    both ears mild loss  . Hyperkalemia 03/07/2018  . Hypertension   . Hypothyroidism 3-21- 13   tx. levothyroxine  . Lichen sclerosus et atrophicus of the vulva    pt reported.   . Osteopenia   .  Stroke [facility])    a. s/p MDT LINQ 04/2014    Patient Active Problem List   Diagnosis Date Noted  . History of loop recorder 11/20/2019  . Plantar wart 07/24/2019  . Statin declined 07/24/2019  . Vitamin D deficiency 01/14/2019  . History of CVA (cerebrovascular accident) without residual deficits 01/14/2019  . Nocturnal leg cramps 01/14/2019  . Chronic constipation 01/14/2019  . Blepharitis of left eye   . Decreased muscle strength 07/11/2018  . Hyperkalemia 03/07/2018  . Atrial fibrillation ([place]) 04/15/2015  . Dyslipidemia 04/15/2015  . Stroke of unknown cause ([place]) 04/08/2014  . Acute CVA (cerebrovascular accident) ([place]) 03/10/2014  . Hypertension 03/10/2014  . 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 Painful orthopaedic hardware ([place]) 04/05/2012  . Peri-prosthetic fracture of femur following total hip arthroplasty 07/31/2011  . Hypothyroidism 07/27/2011    Past Surgical History:  Procedure Laterality Date  . ABDOMINAL HYSTERECTOMY     ovaries removed  . ANKLE SURGERY  2005   right ankle tendon repair  . basal cell removed from face     3-4 times  . BREAST SURGERY  07-1987   lt.breast cancer intraductal cancer, mastectomy  . CATARACT EXTRACTION, BILATERAL  few yrs ago   Bilateral  . HARDWARE REMOVAL  04/05/2012  Procedure: HARDWARE REMOVAL;  Surgeon: Gearlean Alf, MD;  Location: [facility] ORS;  Service: Orthopedics;  Laterality: Right;  Removal of Hardware Right Femur   . HARDWARE REMOVAL Left 06/30/2013   Procedure: LEFT HIP HARDWARE REMOVAL OF CABLES;  Surgeon: Gearlean Alf, MD;  Location: [facility];  Service: Orthopedics;  Laterality: Left;  . HARDWARE REMOVAL Right 08/21/2013   Procedure: RIGHT HIP HARDWARE REMOVAL;  Surgeon: Gearlean Alf, MD;  Location: [facility];  Service: Orthopedics;  Laterality: Right;  . JOINT REPLACEMENT  07-27-11   Bilateral: Right THA - 04/2000, Left THA 06/2001  . left hip replacement Left feb 2003  . LOOP RECORDER IMPLANT  04-08-2014    MDT LINQ implanted by Dr Lovena Le for cryptogenic stroke  . LOOP RECORDER REMOVAL N/A 04/26/2017   Procedure: LOOP RECORDER REMOVAL;  Surgeon: Evans Lance, MD;  Location: [facility];  Service: Cardiovascular;  Laterality: N/A;  . MASTECTOMY  1989   left - Pt states has no restrictions on use of L arm for BP or blood draw  . melanoma removed Left    x 1  . ORIF FEMUR FRACTURE  07/31/2011   Procedure: OPEN REDUCTION INTERNAL FIXATION (ORIF) DISTAL FEMUR FRACTURE;  Surgeon: Gearlean Alf, MD;  Location: [facility];  Service: Orthopedics;  Laterality: Right;  right femur  (c-arm)   . SQUAMOUS CELL CARCINOMA EXCISION  07-27-11   x2 left shoulder  . TOTAL HIP REVISION  04/10/2012   Procedure: TOTAL HIP REVISION;  Surgeon: Gearlean Alf, MD;  Location: [facility];  Service: Orthopedics;  Laterality: Right;  . TOTAL HIP REVISION Left 06/27/2012   Procedure: LEFT TOTAL HIP REVISION;  Surgeon: Gearlean Alf, MD;  Location: [facility];  Service: Orthopedics;  Laterality: Left;     OB History   No obstetric history on file.     Family History  Problem Relation Age of Onset  . Hodgkin's lymphoma Mother   . Celiac disease Mother   . Cancer Mother   . Melanoma Father   . Cancer Father   . Cancer Brother   . Arthritis Sister   . Ulcerative colitis Sister   . Heart disease Paternal Grandmother   . Heart disease Paternal Grandfather     Social History   Tobacco Use  . Smoking status: Former Smoker    Packs/day: 1.00    Years: 10.00    Pack years: 10.00    Types: Cigarettes    Quit date: 05/09/1963    Years since quitting: 56.5  . Smokeless tobacco: Never Used  Vaping Use  . Vaping Use: Never used  Substance Use Topics  . Alcohol use: Yes    Alcohol/week: 1.0 standard drink    Types: 1 Glasses of wine per week    Comment: very little  . Drug use: No    Home Medications Prior to Admission medications   Medication Sig Start Date End Date Taking? Authorizing Provider  amLODipine  (NORVASC) 5 MG tablet TAKE 1 TABLET(5 MG) BY MOUTH EVERY MORNING 09/26/19   Henson, Vickie L, NP-C  clobetasol ointment (TEMOVATE) 4.78 % Apply 1 application topically 2 (two) times daily as needed.  Patient not taking: Reported on 11/26/2019 05/30/18   [provider]  Coenzyme Q10 (COQ-10 PO) Take 1 tablet by mouth daily.    [provider]  docusate sodium (COLACE) 100 MG capsule Take 100 mg by mouth daily.    [provider]  ELIQUIS 2.5 MG  TABS tablet TAKE 1 TABLET TWICE DAILY 09/29/19   Henson, Vickie L, NP-C  hydrochlorothiazide (MICROZIDE) 12.5 MG capsule TAKE 1 CAPSULE(12.5 MG) BY MOUTH DAILY 09/26/19   Henson, Vickie L, NP-C  levothyroxine (SYNTHROID) 100 MCG tablet Take 1 tablet (100 mcg total) by mouth daily. 06/25/19   Henson, Vickie L, NP-C  Multiple Vitamins-Iron (MULTIVITAMINS WITH IRON) TABS tablet Take 1 tablet by mouth daily.     [provider]  Omega-3 Fatty Acids (FISH OIL PO) Take 2 capsules by mouth.     [provider]  Probiotic Product (PROBIOTIC PO) Take 1 capsule by mouth daily.    [provider]  Propylene Glycol (SYSTANE BALANCE OP) Apply 1 drop to eye daily.    [provider]  Vitamin D, Cholecalciferol, 1000 UNITS TABS Take 1,000 Units by mouth daily.     [provider]    Allergies    Lactose intolerance (gi) and Epinephrine  Review of Systems   Review of Systems  Constitutional: Positive for activity change, appetite change, fatigue and fever.  Respiratory: Negative for cough and shortness of breath.   Cardiovascular: Negative for chest pain.  Gastrointestinal: Positive for diarrhea and nausea. Negative for abdominal pain, blood in stool and vomiting.       +bloating  Genitourinary: Negative for dysuria and flank pain.       +decreased and dark urine  Neurological: Positive for headaches.  Hematological: Bruises/bleeds easily.  All other systems reviewed and are negative.   Physical  Exam Updated Vital Signs BP (!) 122/62 (BP Location: Right Arm)   Pulse 60   Temp 98.3 F (36.8 C) (Oral)   Resp 20   Ht 5\' 5"  (1.651 m)   Wt 56.7 kg   SpO2 100%   BMI 20.80 kg/m   Physical Exam Vitals and nursing note reviewed.  Constitutional:      General: She is not in acute distress.    Appearance: Normal appearance. She is well-developed. She is not ill-appearing.     Comments: Elderly female in NAD. Appears younger than reported age. Fatigued appearing  HENT:     Head: Normocephalic and atraumatic.  Eyes:     General: No scleral icterus.       Right eye: No discharge.        Left eye: No discharge.     Conjunctiva/sclera: Conjunctivae normal.     Pupils: Pupils are equal, round, and reactive to light.  Cardiovascular:     Rate and Rhythm: Normal rate and regular rhythm.  Pulmonary:     Effort: Pulmonary effort is normal. No respiratory distress.     Breath sounds: Normal breath sounds.  Abdominal:     General: There is no distension.     Palpations: Abdomen is soft.     Tenderness: There is no abdominal tenderness.  Musculoskeletal:     Cervical back: Normal range of motion.  Skin:    General: Skin is warm and dry.  Neurological:     Mental Status: She is alert and oriented to person, place, and time.  Psychiatric:        Behavior: Behavior normal.     ED Results / Procedures / Treatments   Labs (all labs ordered are listed, but only abnormal results are displayed) Labs Reviewed  COMPREHENSIVE METABOLIC PANEL - Abnormal; Notable for the following components:      Result Value   Sodium 122 (*)    Potassium 3.1 (*)    Chloride  87 (*)    Glucose, Bld 110 (*)    Calcium 8.2 (*)    Total Protein 6.0 (*)    AST 59 (*)    All other components within normal limits  CBC WITH DIFFERENTIAL/PLATELET - Abnormal; Notable for the following components:   WBC 1.9 (*)    Platelets 41 (*)    Neutro Abs 1.1 (*)    Lymphs Abs 0.6 (*)    All other components within  normal limits  CULTURE, BLOOD (ROUTINE X 2)  CULTURE, BLOOD (ROUTINE X 2)  URINE CULTURE  SARS CORONAVIRUS 2 BY RT PCR (HOSPITAL ORDER, [facility])  LACTIC ACID, PLASMA  APTT  PROTIME-INR  URINALYSIS, ROUTINE W REFLEX MICROSCOPIC  CBG MONITORING, ED    EKG EKG Interpretation  Date/Time:  Friday November 28 2019 09:39:33 EDT Ventricular Rate:  55 PR Interval:    QRS Duration: 103 QT Interval:  418 QTC Calculation: 400 R Axis:   4 Text Interpretation: Sinus bradycardia Borderline left axis deviation Confirmed by Veryl Speak [phone]) on 11/28/2019 9:45:35 AM   Radiology CT Abdomen Pelvis W Contrast  Result Date: 11/28/2019 CLINICAL DATA:  86 year old female with acute abdominal pain and fever. EXAM: CT ABDOMEN AND PELVIS WITH CONTRAST TECHNIQUE: Multidetector CT imaging of the abdomen and pelvis was performed using the standard protocol following bolus administration of intravenous contrast. CONTRAST:  134mL OMNIPAQUE IOHEXOL 300 MG/ML  SOLN COMPARISON:  None. FINDINGS: Bilateral hip replacement artifact obscures detail within the pelvis. Lower chest: Cardiomegaly and subsegmental atelectasis/scarring in the lung bases identified. Hepatobiliary: Scattered hepatic cysts are present. Mild periportal edema is identified. No other hepatic abnormalities are present. Gallbladder is unremarkable.  No biliary dilatation. Pancreas: Unremarkable Spleen: Unremarkable Adrenals/Urinary Tract: The kidneys, adrenal glands and bladder are unremarkable. Stomach/Bowel: Stomach is within normal limits. Appendix appears normal. No evidence of bowel wall thickening, distention, or inflammatory changes. Vascular/Lymphatic: Aortic atherosclerosis. No enlarged abdominal or pelvic lymph nodes. Reproductive: No definite abnormality but hip arthroplasty artifact obscures detail. Other: A small amount of free pelvic fluid is nonspecific. There is no evidence of pneumoperitoneum or focal  collection. Musculoskeletal: No acute or suspicious bony abnormalities are identified. Bilateral hip arthroplasties are noted. IMPRESSION: 1. Small amount of free pelvic fluid, nonspecific. 2. Cardiomegaly and subsegmental atelectasis/scarring in the lung bases. 3. Aortic Atherosclerosis (ICD10-I70.0). Electronically Signed   By: Margarette Canada M.D.   On: 11/28/2019 11:19   DG Chest Port 1 View  Result Date: 11/28/2019 CLINICAL DATA:  Fevers and weakness EXAM: PORTABLE CHEST 1 VIEW COMPARISON:  03/10/2014 FINDINGS: Cardiac shadow is stable. Aortic calcifications are again noted. Lungs are clear bilaterally. No focal infiltrate or sizable effusion is noted. Nipple shadow is noted over right base. No new focal abnormality is seen. IMPRESSION: No acute abnormality noted. Electronically Signed   By: Inez Catalina M.D.   On: 11/28/2019 10:23    Procedures Procedures (including critical care time)  CRITICAL CARE Performed by: Recardo Evangelist   Total critical care time: 35 minutes  Critical care time was exclusive of separately billable procedures and treating other patients.  Critical care was necessary to treat or prevent imminent or life-threatening deterioration.  Critical care was time spent personally by me on the following activities: development of treatment plan with patient and/or surrogate as well as nursing, discussions with consultants, evaluation of patient's response to treatment, examination of patient, obtaining history from patient or surrogate, ordering and performing treatments and interventions, ordering and  review of laboratory studies, ordering and review of radiographic studies, pulse oximetry and re-evaluation of patient's condition.   Medications Ordered in ED Medications  lactated ringers bolus 1,000 mL (0 mLs Intravenous Stopped 11/28/19 1038)    And  lactated ringers bolus 500 mL (500 mLs Intravenous New Bag/Given 11/28/19 1159)    And  lactated ringers bolus 250 mL  (250 mLs Intravenous New Bag/Given 11/28/19 1159)  vancomycin (VANCOCIN) IVPB 1000 mg/200 mL premix (1,000 mg Intravenous New Bag/Given 11/28/19 1154)  potassium chloride 10 mEq in 100 mL IVPB (10 mEq Intravenous New Bag/Given 11/28/19 1153)  sodium chloride flush (NS) 0.9 % injection 3 mL (3 mLs Intravenous Given 11/28/19 1009)  ceFEPIme (MAXIPIME) 2 g in sodium chloride 0.9 % 100 mL IVPB (0 g Intravenous Stopped 11/28/19 1028)  metroNIDAZOLE (FLAGYL) IVPB 500 mg ( Intravenous Stopped 11/28/19 1147)  iohexol (OMNIPAQUE) 300 MG/ML solution 100 mL (100 mLs Intravenous Contrast Given 11/28/19 1046)    ED Course  I have reviewed the triage vital signs and the nursing notes.  Pertinent labs & imaging results that were available during my care of the patient were reviewed by me and considered in my medical decision making (see chart for details).  86 year old female presents with fatigue, nausea, diarrhea and fevers. Labs reviewed from yesterday are abnormal. Her vital signs are reassuring here. She is not running a fever at this time but reportedly had one last night and was febrile at her PCP yesterday. She is fatigued appearing but NAD. Heart is regular rate and rhythm. Lungs are CTA. Abdomen is soft and non-tender. Code sepsis initiated with likely GI source, neutropenia, and recent fever.   12:09 PM EKG is sinus bradycardia. CXR is negative. Labs today show WBC 1.9, platelets 41, Na 122, K 3.1. CT abdomen/pelvis obtained is negative. She was given 30cc/kg fluid bolus and broad spectrum abx. Shared visit with Dr. Stark Jock. Will admit for further management. Discussed with Dr. Neysa Bonito with [facility] who will admit. He is requesting Heme-onc consult.  12:21 PM Discussed with Dr. Lorenso Courier with oncology who will come to see   MDM Rules/Calculators/A&P                           Final Clinical Impression(s) / ED Diagnoses Final diagnoses:  Febrile neutropenia ([place])  Hypokalemia  Hyponatremia    Rx / DC  Orders ED Discharge Orders    None       Recardo Evangelist, PA-C 11/28/19 1221    Veryl Speak, MD 11/28/19 1513

## 2019-11-28 NOTE — H&P (Signed)
History and Physical        Hospital Admission Note Date: 11/28/2019  Patient name: [name] record number: [id number] Date of birth: 1931/08/30 Age: 86 y.o. Gender: female  PCP: Rita Ohara, MD  Patient coming from: Home  Chief Complaint    Chief Complaint  Patient presents with  . Weakness  . Abnormal Lab     HPI:   This is an 86 year old female with past medical history of hypertension, A. fib on Eliquis, breast cancer s/p mastectomy and currently in remission who started feeling ill this past Monday with associated belching, diarrhea, nausea and headache as well as fevers up to 102F, decreased urine output and recently saw her PCP on Wednesday due to fatigue and diarrhea at which time she was advised to follow a BRAT diet and increase her fluids however she returned to her PCP on 7/22 and had COVID-19 screening as well as labs which revealed hyponatremia 121, hypocalcemia 7.9 as well as neutropenia with WBC 1.8 prompting her PCP to advise the patient to come to the ED today.  Additionally, patient's PCP was concerned for a syncopal episode when the patient reported she found herself on the ground next to her bed yesterday morning without any pain or injury however her pcp noted a cut above her left eye.   Today, patient states that she continues to feel generalized malaise.  Admits to could have exposure to ticks as she gardens frequently but does not have any known tick bites recently.  ED Course: Afebrile and hemodynamically stable on room air with negative COVID 19 testing from 7/22. Notable labs include Na 122, K 3.1, Cl 87, Ca 8.2, AST/ALT 59/37, WBC 1.9 (baseline around 5), Hb 12.1, platelets 41. UA negative. CT abd/pelvis with a small amount of pelvic fluid, cardiomegaly and atelectasis with aortic atherosclerosis. She was given IV fluids per sepsis protocol and  started on Vancomycin/Metronidazole/Cefepime. Hematology/oncology was consulted.   Vitals:   11/28/19 1125 11/28/19 1155  BP: (!) 139/73 (!) 146/78  Pulse: 72 79  Resp: 20 15  Temp:    SpO2: 99% 97%     Review of Systems:  Review of Systems  Constitutional: Positive for fever and malaise/fatigue.  Eyes: Negative.   Respiratory: Negative for cough and shortness of breath.   Cardiovascular: Negative.  Negative for chest pain and palpitations.  Gastrointestinal: Positive for abdominal pain and diarrhea. Negative for blood in stool, nausea and vomiting.  Genitourinary: Negative.  Negative for dysuria.  Skin: Negative for rash.  Neurological: Positive for weakness.       Generalized weakness  All other systems reviewed and are negative.   Medical/Social/Family History   Past Medical History: Past Medical History:  Diagnosis Date  . Anemia    hx of with surgery   . Arthritis 07-27-11   osteoarthritis-hips/s/p bil. THA, arthritis right hand   . Blepharitis of left eye   . Blood transfusion feb 2014   post surgery some time ago  . Cancer ([place]) 1989   Breast cancer -s/p lt. mastectomy, some squamous cell lesions  . Cold hands   . Complication of anesthesia    epinephrine - screams uncontrollably / pt does not want  general anesthesia or narcotics  . Constipation   . Fuchs' corneal dystrophy    both eyes  . Hard of hearing    both ears mild loss  . Hyperkalemia 03/07/2018  . Hypertension   . Hypothyroidism 3-21- 13   tx. levothyroxine  . Lichen sclerosus et atrophicus of the vulva    pt reported.   . Osteopenia   . Stroke [facility])    a. s/p MDT LINQ 04/2014    Past Surgical History:  Procedure Laterality Date  . ABDOMINAL HYSTERECTOMY     ovaries removed  . ANKLE SURGERY  2005   right ankle tendon repair  . basal cell removed from face     3-4 times  . BREAST SURGERY  07-1987   lt.breast cancer intraductal cancer, mastectomy  . CATARACT EXTRACTION, BILATERAL  few  yrs ago   Bilateral  . HARDWARE REMOVAL  04/05/2012   Procedure: HARDWARE REMOVAL;  Surgeon: Gearlean Alf, MD;  Location: [facility];  Service: Orthopedics;  Laterality: Right;  Removal of Hardware Right Femur   . HARDWARE REMOVAL Left 06/30/2013   Procedure: LEFT HIP HARDWARE REMOVAL OF CABLES;  Surgeon: Gearlean Alf, MD;  Location: [facility];  Service: Orthopedics;  Laterality: Left;  . HARDWARE REMOVAL Right 08/21/2013   Procedure: RIGHT HIP HARDWARE REMOVAL;  Surgeon: Gearlean Alf, MD;  Location: [facility];  Service: Orthopedics;  Laterality: Right;  . JOINT REPLACEMENT  07-27-11   Bilateral: Right THA - 04/2000, Left THA 06/2001  . left hip replacement Left feb 2003  . LOOP RECORDER IMPLANT  04-08-2014   MDT LINQ implanted by Dr Lovena Le for cryptogenic stroke  . LOOP RECORDER REMOVAL N/A 04/26/2017   Procedure: LOOP RECORDER REMOVAL;  Surgeon: Evans Lance, MD;  Location: [facility];  Service: Cardiovascular;  Laterality: N/A;  . MASTECTOMY  1989   left - Pt states has no restrictions on use of L arm for BP or blood draw  . melanoma removed Left    x 1  . ORIF FEMUR FRACTURE  07/31/2011   Procedure: OPEN REDUCTION INTERNAL FIXATION (ORIF) DISTAL FEMUR FRACTURE;  Surgeon: Gearlean Alf, MD;  Location: [facility];  Service: Orthopedics;  Laterality: Right;  right femur  (c-arm)   . SQUAMOUS CELL CARCINOMA EXCISION  07-27-11   x2 left shoulder  . TOTAL HIP REVISION  04/10/2012   Procedure: TOTAL HIP REVISION;  Surgeon: Gearlean Alf, MD;  Location: [facility];  Service: Orthopedics;  Laterality: Right;  . TOTAL HIP REVISION Left 06/27/2012   Procedure: LEFT TOTAL HIP REVISION;  Surgeon: Gearlean Alf, MD;  Location: [facility];  Service: Orthopedics;  Laterality: Left;    Medications: Prior to Admission medications   Medication Sig Start Date End Date Taking? Authorizing Provider  amLODipine (NORVASC) 5 MG tablet TAKE 1 TABLET(5 MG) BY MOUTH EVERY MORNING 09/26/19   Henson, Vickie L, NP-C    clobetasol ointment (TEMOVATE) 2.95 % Apply 1 application topically 2 (two) times daily as needed.  Patient not taking: Reported on 11/26/2019 05/30/18   [provider]  Coenzyme Q10 (COQ-10 PO) Take 1 tablet by mouth daily.    [provider]  docusate sodium (COLACE) 100 MG capsule Take 100 mg by mouth daily.    [provider]  ELIQUIS 2.5 MG TABS tablet TAKE 1 TABLET TWICE DAILY 09/29/19   Henson, Vickie L, NP-C  hydrochlorothiazide (MICROZIDE) 12.5 MG capsule TAKE 1 CAPSULE(12.5 MG) BY MOUTH DAILY  09/26/19   Henson, Vickie L, NP-C  levothyroxine (SYNTHROID) 100 MCG tablet Take 1 tablet (100 mcg total) by mouth daily. 06/25/19   Henson, Vickie L, NP-C  Multiple Vitamins-Iron (MULTIVITAMINS WITH IRON) TABS tablet Take 1 tablet by mouth daily.     [provider]  Omega-3 Fatty Acids (FISH OIL PO) Take 2 capsules by mouth.     [provider]  Probiotic Product (PROBIOTIC PO) Take 1 capsule by mouth daily.    [provider]  Propylene Glycol (SYSTANE BALANCE OP) Apply 1 drop to eye daily.    [provider]  Vitamin D, Cholecalciferol, 1000 UNITS TABS Take 1,000 Units by mouth daily.     [provider]    Allergies:   Allergies  Allergen Reactions  . Lactose Intolerance (Gi)     Bloating, gas  . Epinephrine Other (See Comments)     Reaction:  Caused pt to scream uncontrollably.    Social History:  reports that she quit smoking about 56 years ago. Her smoking use included cigarettes. She has a 10.00 pack-year smoking history. She has never used smokeless tobacco. She reports current alcohol use of about 1.0 standard drink of alcohol per week. She reports that she does not use drugs.  Family History: Family History  Problem Relation Age of Onset  . Hodgkin's lymphoma Mother   . Celiac disease Mother   . Cancer Mother   . Melanoma Father   . Cancer Father   . Cancer Brother   . Arthritis Sister   . Ulcerative  colitis Sister   . Heart disease Paternal Grandmother   . Heart disease Paternal Grandfather      Objective   Physical Exam: Blood pressure (!) 146/78, pulse 79, temperature 98.3 F (36.8 C), temperature source Oral, resp. rate 15, height 5\' 5"  (1.651 m), weight 56.7 kg, SpO2 97 %.  Physical Exam Vitals and nursing note reviewed. Exam conducted with a chaperone present.  Constitutional:      General: She is not in acute distress. HENT:     Head: Normocephalic.  Eyes:     Conjunctiva/sclera: Conjunctivae normal.     Comments: No visible laceration above eye  Cardiovascular:     Rate and Rhythm: Normal rate and regular rhythm.     Heart sounds: No murmur heard.   Pulmonary:     Effort: Pulmonary effort is normal. No respiratory distress.     Breath sounds: Normal breath sounds.  Abdominal:     General: Abdomen is flat.     Tenderness: There is abdominal tenderness in the right lower quadrant, suprapubic area and left lower quadrant.  Musculoskeletal:        General: No swelling or tenderness.  Skin:    Coloration: Skin is not jaundiced.     Comments: Slight pink maculopapular rash across back and upper extremities  Neurological:     Mental Status: She is alert. Mental status is at baseline.  Psychiatric:        Mood and Affect: Mood normal.        Behavior: Behavior normal.     LABS on Admission: I have personally reviewed all the labs and imaging below    Basic Metabolic Panel: Recent Labs  Lab 11/27/19 1530 11/28/19 0942  NA 121* 122*  K 3.6 3.1*  CL 87* 87*  CO2 23 25  GLUCOSE 120* 110*  BUN 10 14  CREATININE 0.70 0.65  CALCIUM 7.9* 8.2*   Liver Function Tests:  Recent Labs  Lab 11/27/19 1530 11/28/19 0942  AST 51* 59*  ALT 30 37  ALKPHOS 44* 42  BILITOT 0.4 0.7  PROT 5.4* 6.0*  ALBUMIN 3.6 3.8   No results for input(s): LIPASE, AMYLASE in the last 168 hours. No results for input(s): AMMONIA in the last 168 hours. CBC: Recent Labs  Lab  11/27/19 1530 11/28/19 0942  WBC 1.8* 1.9*  NEUTROABS 1.3* 1.1*  HGB 11.0* 12.1  HCT 30.6* 36.1  MCV 90 92.8  PLT Comment 41*   Cardiac Enzymes: No results for input(s): CKTOTAL, CKMB, CKMBINDEX, TROPONINI in the last 168 hours. BNP: Invalid input(s): POCBNP CBG: Recent Labs  Lab 11/28/19 0949  GLUCAP 95    Radiological Exams on Admission:  CT Abdomen Pelvis W Contrast  Result Date: 11/28/2019 CLINICAL DATA:  86 year old female with acute abdominal pain and fever. EXAM: CT ABDOMEN AND PELVIS WITH CONTRAST TECHNIQUE: Multidetector CT imaging of the abdomen and pelvis was performed using the standard protocol following bolus administration of intravenous contrast. CONTRAST:  154mL OMNIPAQUE IOHEXOL 300 MG/ML  SOLN COMPARISON:  None. FINDINGS: Bilateral hip replacement artifact obscures detail within the pelvis. Lower chest: Cardiomegaly and subsegmental atelectasis/scarring in the lung bases identified. Hepatobiliary: Scattered hepatic cysts are present. Mild periportal edema is identified. No other hepatic abnormalities are present. Gallbladder is unremarkable.  No biliary dilatation. Pancreas: Unremarkable Spleen: Unremarkable Adrenals/Urinary Tract: The kidneys, adrenal glands and bladder are unremarkable. Stomach/Bowel: Stomach is within normal limits. Appendix appears normal. No evidence of bowel wall thickening, distention, or inflammatory changes. Vascular/Lymphatic: Aortic atherosclerosis. No enlarged abdominal or pelvic lymph nodes. Reproductive: No definite abnormality but hip arthroplasty artifact obscures detail. Other: A small amount of free pelvic fluid is nonspecific. There is no evidence of pneumoperitoneum or focal collection. Musculoskeletal: No acute or suspicious bony abnormalities are identified. Bilateral hip arthroplasties are noted. IMPRESSION: 1. Small amount of free pelvic fluid, nonspecific. 2. Cardiomegaly and subsegmental atelectasis/scarring in the lung bases. 3.  Aortic Atherosclerosis (ICD10-I70.0). Electronically Signed   By: Margarette Canada M.D.   On: 11/28/2019 11:19   DG Chest Port 1 View  Result Date: 11/28/2019 CLINICAL DATA:  Fevers and weakness EXAM: PORTABLE CHEST 1 VIEW COMPARISON:  03/10/2014 FINDINGS: Cardiac shadow is stable. Aortic calcifications are again noted. Lungs are clear bilaterally. No focal infiltrate or sizable effusion is noted. Nipple shadow is noted over right base. No new focal abnormality is seen. IMPRESSION: No acute abnormality noted. Electronically Signed   By: Inez Catalina M.D.   On: 11/28/2019 10:23      EKG: Independently reviewed. Sinus bradycardia   A & P   Active Problems:   Febrile neutropenia (HCC)   1. Febrile Neutropenia  Thrombocytopenia of unknown etiology a. Viral etiology vs tickborne vs GI source vs other b. Afebrile and hemodynamically stable on room air without lactic acidosis, not septic here but concern for developing sepsis c. Reportedly fevers at home (102F) with [place] 1064 and thrombocytopenia (41) d. COVID 19 negative x2 e. Received fluids per septic protocol in ED.  Change LR to NS for hyponatremia f. Follow up blood cultures g. Continue cefepime for now h. Heme/onc consulted i. Stool studies due to diarrhea j. Legionella Ag due to diarrhea and hyponatremia (expect to be negative without respiratory symptoms) k. Lyme, RMSF, Ehrlichia panel l. Telemetry  2. Hyponatremia likely secondary to decreased p.o. intake a. Hold HCTZ b. IV fluids and encourage p.o. intake c. Trend BMP, do not correct sodium greater than 6 -  8 in 24 hours   3. Hypokalemia a. Possibly from diarrhea b. Repleted, follow repeat labs  4. Generalized weakness a. Multifactorial and secondary to above b. PT eval  5. Paroxysmal atrial fibrillation, currently sinus bradycardia a. Hold Eliquis in setting of thrombocytopenia b. Telemetry  6. Questionable syncopal episode  a. Continue above work-up for  now   DVT prophylaxis: SCDs   Code Status: Prior  Diet: Regular Family Communication: Admission, patients condition and plan of care including tests being ordered have been discussed with the patient who indicates understanding and agrees with the plan and Code Status. Patient's daughter at bedside was updated  Disposition Plan: The appropriate patient status for this patient is INPATIENT. Inpatient status is judged to be reasonable and necessary in order to provide the required intensity of service to ensure the patient's safety. The patient's presenting symptoms, physical exam findings, and initial radiographic and laboratory data in the context of their chronic comorbidities is felt to place them at high risk for further clinical deterioration. Furthermore, it is not anticipated that the patient will be medically stable for discharge from the hospital within 2 midnights of admission. The following factors support the patient status of inpatient.   " The patient's presenting symptoms include generalized weakness, fever. " The worrisome physical exam findings include light rash on back and on back and arms " The initial radiographic and laboratory data are worrisome because of leukopenia, thrombocytopenia, hyponatremia, hypokalemia " The chronic co-morbidities include atrial fibrillation on anticoagulation.   * I certify that at the point of admission it is my clinical judgment that the patient will require inpatient hospital care spanning beyond 2 midnights from the point of admission due to high intensity of service, high risk for further deterioration and high frequency of surveillance required.*   Status is: Inpatient  Remains inpatient appropriate because:Persistent severe electrolyte disturbances, IV treatments appropriate due to intensity of illness or inability to take PO and Inpatient level of care appropriate due to severity of illness   Dispo: The patient is from: Home               Anticipated d/c is to: Home              Anticipated d/c date is: 3 days              Patient currently is not medically stable to d/c.        The medical decision making on this patient was of high complexity and the patient is at high risk for clinical deterioration, therefore this is a level 3 admission.  Consultants  . Hematology/Oncology  Procedures  . none  Time Spent on Admission: 78 minutes    Harold Hedge, DO [facility] Pager [phone] 11/28/2019, 12:19 PM

## 2019-11-28 NOTE — Progress Notes (Signed)
PHARMACY NOTE:  ANTIMICROBIAL RENAL DOSAGE ADJUSTMENT  Current antimicrobial regimen includes a mismatch between antimicrobial dosage and estimated renal function.  As per policy approved by the Pharmacy & Therapeutics and Medical Executive Committees, the antimicrobial dosage will be adjusted accordingly.  Current antimicrobial dosage:  Cefepime 2gm q8  Indication: Febrile neutropenia  Renal Function:   Estimated Creatinine Clearance: 45.2 mL/min (by C-G formula based on SCr of 0.53 mg/dL). []      On intermittent HD, scheduled: []      On CRRT    Antimicrobial dosage has been changed to:  Cefepime 2gm q12   Additional Comments:    Thank you for allowing pharmacy to be a part of this patient's care.  Minda Ditto PharmD 11/28/2019 7:36 PM

## 2019-11-28 NOTE — Consult Note (Addendum)
[place]  [phone] Fax:([phone]) [place]  Referring MD: Dr. 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  The patient presented to the emergency room with generalized weakness at the prompting of her primary care provider who will check labs in our office and found neutropenia.  The patient reported that she was not feeling well starting this past Sunday.  She initially had belching, diarrhea, nausea, and headache as well as fever up to 102.  She has been having some intermittent chills.  She went to her primary care provider on 11/27/2019 and had Covid screening as well as labs which revealed a sodium of 121, calcium 7.9, and WBC of 1.8.  She was advised to go to the emergency room.  Also of note in the patient's chart, the patient's PCP was concerned about a syncopal episode when the patient reported that she found herself on the ground next to the bed the day.  In the emergency room, her WBC was 1.9 and platelet count was 41,000.  Her sodium was 122, potassium 3.1, calcium 8.2, total protein 6.0, and AST 59.  Blood cultures obtained.  UA negative for leukocytes and nitrite. Urine culture pending.  Chest x-ray negative.  CT of the abdomen and pelvis showed a small amount of free pelvic fluid which was nonspecific and cardiomegaly and subsegmental atelectasis/scarring in the lung bases.  The patient has not had any fever since she has arrived in the emergency room.  She has been given IV fluids and started on IV antibiotics.  The patient's most recent CBC available in epic was performed on 07/24/2019 and at that time her white blood cell count and platelet count were completely normal.  The patient was  seen in the emergency room today.  Her daughter is at the bedside.  The patient reports that she started feeling unwell this past Sunday with belching and nausea but no vomiting.  She also had a headache.  She has noted diarrhea and a fever up to 102 with chills.  She stated that she had night sweats 1 night and soaked her sheets.  She noted decreased urine output.  Currently denies chest pain and shortness of breath.  No rashes, bruising, or bleeding noted.  Reports that she never had radiation or chemotherapy with her diagnosis of breast cancer.  States that she has a lot of ticks in her backyard but is not aware of any recent tick or bug bite.  Hematology was asked to see the patient to make recommendations regarding her febrile neutropenia and thrombocytopenia.  Past Medical History:  Diagnosis Date  . Anemia    hx of with surgery   . Arthritis 07-27-11   osteoarthritis-hips/s/p bil. THA, arthritis right hand   . Blepharitis of left eye   . Blood transfusion feb 2014   post surgery some time ago  . Cancer ([place]) 1989   Breast cancer -s/p lt. mastectomy, some squamous cell lesions  . Cold hands   . Complication of anesthesia    epinephrine - screams uncontrollably / pt does not want general anesthesia or narcotics  . Constipation   . Fuchs' corneal dystrophy    both eyes  . Hard of hearing    both ears mild loss  . Hyperkalemia  03/07/2018  . Hypertension   . Hypothyroidism 3-21- 13   tx. levothyroxine  . Lichen sclerosus et atrophicus of the vulva    pt reported.   . Osteopenia   . Stroke [facility])    a. s/p MDT LINQ 04/2014  :    Past Surgical History:  Procedure Laterality Date  . ABDOMINAL HYSTERECTOMY     ovaries removed  . ANKLE SURGERY  2005   right ankle tendon repair  . basal cell removed from face     3-4 times  . BREAST SURGERY  07-1987   lt.breast cancer intraductal cancer, mastectomy  . CATARACT EXTRACTION, BILATERAL  few yrs ago   Bilateral  . HARDWARE REMOVAL   04/05/2012   Procedure: HARDWARE REMOVAL;  Surgeon: Gearlean Alf, MD;  Location: [facility];  Service: Orthopedics;  Laterality: Right;  Removal of Hardware Right Femur   . HARDWARE REMOVAL Left 06/30/2013   Procedure: LEFT HIP HARDWARE REMOVAL OF CABLES;  Surgeon: Gearlean Alf, MD;  Location: [facility];  Service: Orthopedics;  Laterality: Left;  . HARDWARE REMOVAL Right 08/21/2013   Procedure: RIGHT HIP HARDWARE REMOVAL;  Surgeon: Gearlean Alf, MD;  Location: [facility];  Service: Orthopedics;  Laterality: Right;  . JOINT REPLACEMENT  07-27-11   Bilateral: Right THA - 04/2000, Left THA 06/2001  . left hip replacement Left feb 2003  . LOOP RECORDER IMPLANT  04-08-2014   MDT LINQ implanted by Dr Lovena Le for cryptogenic stroke  . LOOP RECORDER REMOVAL N/A 04/26/2017   Procedure: LOOP RECORDER REMOVAL;  Surgeon: Evans Lance, MD;  Location: [facility];  Service: Cardiovascular;  Laterality: N/A;  . MASTECTOMY  1989   left - Pt states has no restrictions on use of L arm for BP or blood draw  . melanoma removed Left    x 1  . ORIF FEMUR FRACTURE  07/31/2011   Procedure: OPEN REDUCTION INTERNAL FIXATION (ORIF) DISTAL FEMUR FRACTURE;  Surgeon: Gearlean Alf, MD;  Location: [facility];  Service: Orthopedics;  Laterality: Right;  right femur  (c-arm)   . SQUAMOUS CELL CARCINOMA EXCISION  07-27-11   x2 left shoulder  . TOTAL HIP REVISION  04/10/2012   Procedure: TOTAL HIP REVISION;  Surgeon: Gearlean Alf, MD;  Location: [facility];  Service: Orthopedics;  Laterality: Right;  . TOTAL HIP REVISION Left 06/27/2012   Procedure: LEFT TOTAL HIP REVISION;  Surgeon: Gearlean Alf, MD;  Location: [facility];  Service: Orthopedics;  Laterality: Left;  :   CURRENT MEDS: Current Facility-Administered Medications  Medication Dose Route Frequency Provider Last Rate Last Admin  . 0.9 %  sodium chloride infusion   Intravenous Continuous Harold Hedge, MD      . acetaminophen (TYLENOL) tablet 650 mg  650 mg Oral Q6H  PRN Harold Hedge, MD       Or  . acetaminophen (TYLENOL) suppository 650 mg  650 mg Rectal Q6H PRN Harold Hedge, MD      . amLODipine (NORVASC) tablet 5 mg  5 mg Oral Daily Marva Panda E, MD      . ceFEPIme (MAXIPIME) 2 g in sodium chloride 0.9 % 100 mL IVPB  2 g Intravenous Q8H Harold Hedge, MD 200 mL/hr at 11/28/19 1420 2 g at 11/28/19 1420  . levothyroxine (SYNTHROID) tablet 100 mcg  100 mcg Oral Daily Harold Hedge, MD      . metoprolol tartrate (LOPRESSOR) injection 5 mg  5 mg  Intravenous Q6H PRN Harold Hedge, MD      . sodium chloride flush (NS) 0.9 % injection 3 mL  3 mL Intravenous Q12H Harold Hedge, MD       Current Outpatient Medications  Medication Sig Dispense Refill  . amLODipine (NORVASC) 5 MG tablet TAKE 1 TABLET(5 MG) BY MOUTH EVERY MORNING (Patient taking differently: Take 5 mg by mouth daily. TAKE 1 TABLET(5 MG) BY MOUTH EVERY MORNING) 90 tablet 1  . Coenzyme Q10 (COQ-10 PO) Take 1 tablet by mouth daily.    Marland Kitchen docusate sodium (COLACE) 100 MG capsule Take 100 mg by mouth daily.    Marland Kitchen ELIQUIS 2.5 MG TABS tablet TAKE 1 TABLET TWICE DAILY (Patient taking differently: Take 2.5 mg by mouth 2 (two) times daily. ) 180 tablet 0  . hydrochlorothiazide (MICROZIDE) 12.5 MG capsule TAKE 1 CAPSULE(12.5 MG) BY MOUTH DAILY (Patient taking differently: Take 12.5 mg by mouth daily. TAKE 1 CAPSULE(12.5 MG) BY MOUTH DAILY) 90 capsule 1  . levothyroxine (SYNTHROID) 100 MCG tablet Take 1 tablet (100 mcg total) by mouth daily. 90 tablet 0  . Multiple Vitamins-Iron (MULTIVITAMINS WITH IRON) TABS tablet Take 1 tablet by mouth daily.     . Omega-3 Fatty Acids (FISH OIL PO) Take 2 capsules by mouth.     . Probiotic Product (PROBIOTIC PO) Take 1 capsule by mouth daily.    Marland Kitchen Propylene Glycol (SYSTANE BALANCE OP) Apply 1 drop to eye in the morning and at bedtime.     . Vitamin D, Cholecalciferol, 1000 UNITS TABS Take 1,000 Units by mouth daily.         Allergies  Allergen Reactions  .  Lactose Intolerance (Gi)     Bloating, gas  . Epinephrine Other (See Comments)     Reaction:  Caused pt to scream uncontrollably.  :  Family History  Problem Relation Age of Onset  . Hodgkin's lymphoma Mother   . Celiac disease Mother   . Cancer Mother   . Melanoma Father   . Cancer Father   . Cancer Brother   . Arthritis Sister   . Ulcerative colitis Sister   . Heart disease Paternal Grandmother   . Heart disease Paternal Grandfather   :  Social History   Socioeconomic History  . Marital status: Single    Spouse name: Not on file  . Number of children: 1  . Years of education: Not on file  . Highest education level: Not on file  Occupational History  . Occupation: Retired  Tobacco Use  . Smoking status: Former Smoker    Packs/day: 1.00    Years: 10.00    Pack years: 10.00    Types: Cigarettes    Quit date: 05/09/1963    Years since quitting: 56.5  . Smokeless tobacco: Never Used  Vaping Use  . Vaping Use: Never used  Substance and Sexual Activity  . Alcohol use: Yes    Alcohol/week: 1.0 standard drink    Types: 1 Glasses of wine per week    Comment: very little  . Drug use: No  . Sexual activity: Never  Other Topics Concern  . Not on file  Social History Narrative   Lives alone.   Social Determinants of Health   Financial Resource Strain:   . Difficulty of Paying Living Expenses:   Food Insecurity:   . Worried About Charity fundraiser in the Last Year:   . [place] in the Last Year:  Transportation Needs:   . Film/video editor (Medical):   Marland Kitchen Lack of Transportation (Non-Medical):   Physical Activity:   . Days of Exercise per Week:   . Minutes of Exercise per Session:   Stress:   . Feeling of Stress :   Social Connections:   . Frequency of Communication with Friends and Family:   . Frequency of Social Gatherings with Friends and Family:   . Attends Religious Services:   . Active Member of Clubs or Organizations:   . Attends Theatre manager Meetings:   Marland Kitchen Marital Status:   Intimate Partner Violence:   . Fear of Current or Ex-Partner:   . Emotionally Abused:   Marland Kitchen Physically Abused:   . Sexually Abused:   :  REVIEW OF SYSTEMS:  A comprehensive 14 point review of systems as negative except as noted in the HPI.    Exam: Patient Vitals for the past 24 hrs:  BP Temp Temp src Pulse Resp SpO2 Height Weight  11/28/19 1200 (!) 150/72 -- -- 71 14 96 % -- --  11/28/19 1155 (!) 146/78 -- -- 79 15 97 % -- --  11/28/19 1125 (!) 139/73 -- -- 72 20 99 % -- --  11/28/19 1030 (!) 127/64 -- -- 55 17 93 % -- --  11/28/19 1000 (!) 136/67 -- -- 54 16 100 % -- --  11/28/19 0901 -- -- -- -- -- -- _0  (1.651 m) 56.7 kg  11/28/19 0900 (!) 122/62 98.3 F (36.8 C) Oral 60 20 100 % -- --    General: Awake and alert, no distress Eyes:  no scleral icterus.   ENT:  There were no oropharyngeal lesions.    Lymphatics:  Negative cervical, supraclavicular or axillary adenopathy.   Respiratory: lungs were clear bilaterally without wheezing or crackles.   Cardiovascular:  Regular rate and rhythm, S1/S2, without murmur, rub or gallop.  There was no pedal edema.   GI:  abdomen was soft, flat, nontender, nondistended, without organomegaly.     Skin exam was without ecchymosis, petechiae.   Neuro exam was nonfocal.  Patient was alert and oriented.  Attention was good.   Language was appropriate.  Mood was normal without depression.  Speech was not pressured.  Thought content was not tangential.    LABS:  Lab Results  Component Value Date   WBC 1.9 (L) 11/28/2019   HGB 12.1 11/28/2019   HCT 36.1 11/28/2019   PLT 41 (L) 11/28/2019   GLUCOSE 110 (H) 11/28/2019   CHOL 231 (H) 01/14/2019   TRIG 54 01/14/2019   HDL 85 01/14/2019   LDLCALC 137 (H) 01/14/2019   ALT 37 11/28/2019   AST 59 (H) 11/28/2019   NA 122 (L) 11/28/2019   K 3.1 (L) 11/28/2019   CL 87 (L) 11/28/2019   CREATININE 0.65 11/28/2019   BUN 14 11/28/2019   CO2 25  11/28/2019   INR 1.2 11/28/2019   HGBA1C 5.5 03/11/2014    CT Abdomen Pelvis W Contrast  Result Date: 11/28/2019 CLINICAL DATA:  86 year old female with acute abdominal pain and fever. EXAM: CT ABDOMEN AND PELVIS WITH CONTRAST TECHNIQUE: Multidetector CT imaging of the abdomen and pelvis was performed using the standard protocol following bolus administration of intravenous contrast. CONTRAST:  184m OMNIPAQUE IOHEXOL 300 MG/ML  SOLN COMPARISON:  None. FINDINGS: Bilateral hip replacement artifact obscures detail within the pelvis. Lower chest: Cardiomegaly and subsegmental atelectasis/scarring in the lung bases identified. Hepatobiliary: Scattered hepatic cysts are present. Mild  periportal edema is identified. No other hepatic abnormalities are present. Gallbladder is unremarkable.  No biliary dilatation. Pancreas: Unremarkable Spleen: Unremarkable Adrenals/Urinary Tract: The kidneys, adrenal glands and bladder are unremarkable. Stomach/Bowel: Stomach is within normal limits. Appendix appears normal. No evidence of bowel wall thickening, distention, or inflammatory changes. Vascular/Lymphatic: Aortic atherosclerosis. No enlarged abdominal or pelvic lymph nodes. Reproductive: No definite abnormality but hip arthroplasty artifact obscures detail. Other: A small amount of free pelvic fluid is nonspecific. There is no evidence of pneumoperitoneum or focal collection. Musculoskeletal: No acute or suspicious bony abnormalities are identified. Bilateral hip arthroplasties are noted. IMPRESSION: 1. Small amount of free pelvic fluid, nonspecific. 2. Cardiomegaly and subsegmental atelectasis/scarring in the lung bases. 3. Aortic Atherosclerosis (ICD10-I70.0). Electronically Signed   By: Margarette Canada M.D.   On: 11/28/2019 11:19   DG Chest Port 1 View  Result Date: 11/28/2019 CLINICAL DATA:  Fevers and weakness EXAM: PORTABLE CHEST 1 VIEW COMPARISON:  03/10/2014 FINDINGS: Cardiac shadow is stable. Aortic  calcifications are again noted. Lungs are clear bilaterally. No focal infiltrate or sizable effusion is noted. Nipple shadow is noted over right base. No new focal abnormality is seen. IMPRESSION: No acute abnormality noted. Electronically Signed   By: Inez Catalina M.D.   On: 11/28/2019 10:23     ASSESSMENT AND PLAN:   This is a pleasant 86 year old female who has been feeling unwell for the past 4 to 5 days with fevers, chills, nausea, and diarrhea.  She was seen by her primary care provider who checked lab work and found neutropenia and thrombocytopenia.  She was also noted to have significant hyponatremia.  Febrile neutropenia and thrombocytopenia --Discussed with patient and her family member at her acute infection could be the cause of her febrile neutropenia and thrombocytopenia versus her neutropenia could have made her more susceptible to infection. --Blood cultures, urine culture, and chest x-ray completed. --We have ordered peripheral blood smear for review as well as a vitamin B12, folate, copper, DIC panel, and hepatitis B and C serologies. --If the above is unrevealing, would consider additional work-up for rickettsial infection. --Discussed with the patient that she may need a bone marrow biopsy if the above work-up is unrevealing or no improvement in her WBC and platelets over the weekend. --Recommend holding Eliquis for now until platelets improve to at least greater than 50,000.   Hyponatremia --Likely due to to decreased p.o. intake --Hydrochlorothiazide is being held and she is receiving IV fluids --Monitor sodium level closely  Atrial fibrillation --Currently on Eliquis but recommend holding due to thrombocytopenia  Thank you for this referral.  Mikey Bussing, DNP, AGPCNP-BC, AOCNP Mon/Tues/Thurs/Fri 7am-5pm; Off Wednesdays Cell: [phone]

## 2019-11-29 DIAGNOSIS — E871 Hypo-osmolality and hyponatremia: Secondary | ICD-10-CM

## 2019-11-29 DIAGNOSIS — R531 Weakness: Secondary | ICD-10-CM

## 2019-11-29 DIAGNOSIS — G9332 Myalgic encephalomyelitis/chronic fatigue syndrome: Secondary | ICD-10-CM

## 2019-11-29 LAB — COMPREHENSIVE METABOLIC PANEL
ALT: 29 U/L (ref 0–44)
AST: 49 U/L — ABNORMAL HIGH (ref 15–41)
Albumin: 2.9 g/dL — ABNORMAL LOW (ref 3.5–5.0)
Alkaline Phosphatase: 36 U/L — ABNORMAL LOW (ref 38–126)
Anion gap: 11 (ref 5–15)
BUN: 10 mg/dL (ref 8–23)
CO2: 23 mmol/L (ref 22–32)
Calcium: 7.7 mg/dL — ABNORMAL LOW (ref 8.9–10.3)
Chloride: 95 mmol/L — ABNORMAL LOW (ref 98–111)
Creatinine, Ser: 0.58 mg/dL (ref 0.44–1.00)
GFR calc Af Amer: >60 mL/min (ref >60)
GFR calc non Af Amer: >60 mL/min (ref >60)
Glucose, Bld: 87 mg/dL (ref 70–99)
Potassium: 3.1 mmol/L — ABNORMAL LOW (ref 3.5–5.1)
Sodium: 129 mmol/L — ABNORMAL LOW (ref 135–145)
Total Bilirubin: 0.5 mg/dL (ref 0.3–1.2)
Total Protein: 5 g/dL — ABNORMAL LOW (ref 6.5–8.1)

## 2019-11-29 LAB — URINE CULTURE: Culture: NO GROWTH

## 2019-11-29 LAB — CBC
HCT: 32.4 % — ABNORMAL LOW (ref 36.0–46.0)
Hemoglobin: 10.9 g/dL — ABNORMAL LOW (ref 12.0–15.0)
MCH: 31.1 pg (ref 26.0–34.0)
MCHC: 33.6 g/dL (ref 30.0–36.0)
MCV: 92.6 fL (ref 80.0–100.0)
Platelets: 42 10*3/uL — ABNORMAL LOW (ref 150–400)
RBC: 3.5 MIL/uL — ABNORMAL LOW (ref 3.87–5.11)
RDW: 14.6 % (ref 11.5–15.5)
WBC: 3.1 10*3/uL — ABNORMAL LOW (ref 4.0–10.5)
nRBC: 0 % (ref 0.0–0.2)

## 2019-11-29 LAB — MAGNESIUM: Magnesium: 1.8 mg/dL (ref 1.7–2.4)

## 2019-11-29 MED ORDER — POTASSIUM CHLORIDE CRYS ER 20 MEQ PO TBCR
40.0000 meq | EXTENDED_RELEASE_TABLET | ORAL | Status: AC
  Administered 2019-11-29 (×2): 40 meq via ORAL
  Filled 2019-11-29 (×2): qty 2

## 2019-11-29 MED ORDER — POLYETHYLENE GLYCOL 3350 17 G PO PACK
17.0000 g | PACK | Freq: Two times a day (BID) | ORAL | Status: DC
  Administered 2019-11-29 (×2): 17 g via ORAL
  Filled 2019-11-29 (×5): qty 1

## 2019-11-29 MED ORDER — SENNA 8.6 MG PO TABS
1.0000 | ORAL_TABLET | Freq: Every day | ORAL | Status: DC
  Administered 2019-11-29 – 2019-11-30 (×2): 8.6 mg via ORAL
  Filled 2019-11-29 (×2): qty 1

## 2019-11-29 MED ORDER — BISACODYL 10 MG RE SUPP: 10.0000 mg | Freq: Every day | RECTAL | Status: DC | PRN

## 2019-11-29 NOTE — Progress Notes (Signed)
PROGRESS NOTE  [name]:[id number] DOB: [birth year] DOA: 11/28/2019 PCP: Girtha Rm, NP-C  Brief History   53yow PMH afib, breast cancer s/p mastectomy, currently in remission, presented w/ diarrhea, nausea, fever to 102, headache, syncope. Admitted for febrile neutropenia and thrombocytopenia  A & P  Febrile neutropenia, thrombocytopenia, both acute --etiology unclear, afebrile, stable hemodynamics, normal lactate. DDx includes rickettsial disease, nutritional deficiency, or possible hematological malignancy --COVID negative --follow culture data --WBC up to 3 today --continue empiric cefepime and doxycycline --per hematology: f/u peripheral blood smear review by hematology; as well as copper, DIC panel interpretation by hematology, rickettsia panel. B12, folate WNL, hepatitis B and C nonreactive --possible bone marrow biopsy 7/26 if no etiology found  Hypovolemic hyponatremia --in context of acute illness, HCTZ --hold HCTZ --better today, continue IVF --BMP in AM  Hypokalemia --replete --check Mg  Generalized weakness --PT eval  PAF --apixaban on hold secondary to thrombocytopenia until >50k --SCDs  Syncope --most likely from acute illness; no concerning features. Continue telemetry.   Disposition Plan:  Discussion: no s/s of sepsis. Will downgrade to telemetry. Continue abx, replace K+ and sodium; labs in AM.  Status is: Inpatient  Remains inpatient appropriate because:IV treatments appropriate due to intensity of illness or inability to take PO   Dispo: The patient is from: Home              Anticipated d/c is to: Home              Anticipated d/c date is: 2 days              Patient currently is not medically stable to d/c.  DVT prophylaxis: SCDs Start: 11/28/19 1412 Code Status: Full Family Communication: none present or requested  Murray Hodgkins, MD  [facility] Direct contact: see [email or web address] (further directions at bottom of note if  needed) 7PM-7AM contact night coverage as at bottom of note 11/29/2019, 10:47 AM  LOS: 1 day   Significant Hospital Events   . Admitted 7/23   Consults:  . Hematology    Procedures:  .   Significant Diagnostic Tests:  Marland Kitchen    Micro Data:  . BC > . UC >   Antimicrobials:  .   Interval History/Subjective  Feels ok, but no better. No n/v. Thirsty.   Objective   Vitals:  Vitals:   11/29/19 0923 11/29/19 1003  BP: (!) 136/64 (!) 132/78  Pulse: 59 55  Resp: (!) 25 18  Temp: 98.5 F (36.9 C) 98.5 F (36.9 C)  SpO2: 95% 96%    Exam:  Constitutional:   . Appears calm and comfortable, ill but not toxic ENMT:  . grossly normal hearing  Respiratory:  . CTA bilaterally, no w/r/r.  . Respiratory effort normal.  Cardiovascular:  . RRR, no m/r/g . No LE extremity edema   Abdomen:  . Soft ntnd Psychiatric:  . Mental status o Mood, affect appropriate . judgment and insight appear intact   I have personally reviewed the following:   Today's Data  . UOP 1200 . Na+ 129, K+ 3.1, creatinine WNL . WBC up to 3.1 . Hgb stable at 10.9 . Plts stable at 42  Scheduled Meds: . amLODipine  5 mg Oral Daily  . doxycycline  100 mg Oral Q12H  . levothyroxine  100 mcg Oral Daily  . polyethylene glycol  17 g Oral BID  . polyvinyl alcohol  1 drop Both Eyes BID  . potassium chloride  40 mEq  Oral Q4H  . senna  1 tablet Oral QHS  . sodium chloride flush  3 mL Intravenous Q12H   Continuous Infusions: . ceFEPime (MAXIPIME) IV Stopped (11/29/19 0232)    Principal Problem:   Febrile neutropenia (HCC) Active Problems:   Hyponatremia   Generalized weakness   LOS: 1 day   How to contact the [facility] Attending or Consulting provider [place] or covering provider during after hours [place], for this patient?  1. Check the care team in [facility] and look for a) attending/consulting [facility] provider listed and b) the [facility] team listed 2. Log into [email or web address] and use [place]'s universal password  to access. If you do not have the password, please contact the hospital operator. 3. Locate the [facility] provider you are looking for under [facility] and page to a number that you can be directly reached. 4. If you still have difficulty reaching the provider, please page the [facility] (Director on Call) for the Hospitalists listed on amion for assistance.

## 2019-11-30 DIAGNOSIS — I480 Paroxysmal atrial fibrillation: Secondary | ICD-10-CM

## 2019-11-30 LAB — BASIC METABOLIC PANEL
Anion gap: 8 (ref 5–15)
BUN: 11 mg/dL (ref 8–23)
CO2: 23 mmol/L (ref 22–32)
Calcium: 7.9 mg/dL — ABNORMAL LOW (ref 8.9–10.3)
Chloride: 99 mmol/L (ref 98–111)
Creatinine, Ser: 0.58 mg/dL (ref 0.44–1.00)
GFR calc Af Amer: >60 mL/min (ref >60)
GFR calc non Af Amer: >60 mL/min (ref >60)
Glucose, Bld: 89 mg/dL (ref 70–99)
Potassium: 4 mmol/L (ref 3.5–5.1)
Sodium: 130 mmol/L — ABNORMAL LOW (ref 135–145)

## 2019-11-30 LAB — CBC WITH DIFFERENTIAL/PLATELET
Abs Immature Granulocytes: 0.01 10*3/uL (ref 0.00–0.07)
Basophils Absolute: 0.1 10*3/uL (ref 0.0–0.1)
Basophils Relative: 1 %
Eosinophils Absolute: 0.1 10*3/uL (ref 0.0–0.5)
Eosinophils Relative: 1 %
HCT: 31.1 % — ABNORMAL LOW (ref 36.0–46.0)
Hemoglobin: 10.6 g/dL — ABNORMAL LOW (ref 12.0–15.0)
Immature Granulocytes: 0 %
Lymphocytes Relative: 51 %
Lymphs Abs: 2.3 10*3/uL (ref 0.7–4.0)
MCH: 31.9 pg (ref 26.0–34.0)
MCHC: 34.1 g/dL (ref 30.0–36.0)
MCV: 93.7 fL (ref 80.0–100.0)
Monocytes Absolute: 0.5 10*3/uL (ref 0.1–1.0)
Monocytes Relative: 12 %
Neutro Abs: 1.6 10*3/uL — ABNORMAL LOW (ref 1.7–7.7)
Neutrophils Relative %: 35 %
Platelets: 54 10*3/uL — ABNORMAL LOW (ref 150–400)
RBC: 3.32 MIL/uL — ABNORMAL LOW (ref 3.87–5.11)
RDW: 14.8 % (ref 11.5–15.5)
WBC: 4.5 10*3/uL (ref 4.0–10.5)
nRBC: 0 % (ref 0.0–0.2)

## 2019-11-30 LAB — MAGNESIUM: Magnesium: 1.9 mg/dL (ref 1.7–2.4)

## 2019-11-30 LAB — PHOSPHORUS: Phosphorus: 1.7 mg/dL — ABNORMAL LOW (ref 2.5–4.6)

## 2019-11-30 MED ORDER — POTASSIUM PHOSPHATES 15 MMOLE/5ML IV SOLN
30.0000 mmol | Freq: Once | INTRAVENOUS | Status: AC
  Administered 2019-11-30: 30 mmol via INTRAVENOUS
  Filled 2019-11-30: qty 10

## 2019-11-30 NOTE — H&P (Signed)
Chief Complaint: Patient was seen in consultation today for  Chief Complaint  Patient presents with  . Weakness  . Abnormal Lab    Referring Physician(s): Dr. Sarajane Jews  Supervising Physician: Arne Cleveland  Patient Status: [facility] - In-pt  History of Present Illness: [name] is a 86 y.o. female with a medical history that includes atrial fibrillation (on Eliquis), HTN, thyroid disease and breast cancer in remission (s/p left mastectomy). She presented to the ED 11/28/19 with diarrhea, nausea, syncope and a fever of 102. She was admitted for febrile neutropenia and thrombocytopenia.   Interventional Radiology has been asked to evaluate this patient for an image-guided bone marrow aspiration and biopsy for further work up and diagnosis.   Past Medical History:  Diagnosis Date  . Anemia    hx of with surgery   . Arthritis 07-27-11   osteoarthritis-hips/s/p bil. THA, arthritis right hand   . Blepharitis of left eye   . Blood transfusion feb 2014   post surgery some time ago  . Cancer ([place]) 1989   Breast cancer -s/p lt. mastectomy, some squamous cell lesions  . Cold hands   . Complication of anesthesia    epinephrine - screams uncontrollably / pt does not want general anesthesia or narcotics  . Constipation   . Fuchs' corneal dystrophy    both eyes  . Hard of hearing    both ears mild loss  . Hyperkalemia 03/07/2018  . Hypertension   . Hypothyroidism 3-21- 13   tx. levothyroxine  . Lichen sclerosus et atrophicus of the vulva    pt reported.   . Osteopenia   . Stroke [facility])    a. s/p MDT LINQ 04/2014    Past Surgical History:  Procedure Laterality Date  . ABDOMINAL HYSTERECTOMY     ovaries removed  . ANKLE SURGERY  2005   right ankle tendon repair  . basal cell removed from face     3-4 times  . BREAST SURGERY  07-1987   lt.breast cancer intraductal cancer, mastectomy  . CATARACT EXTRACTION, BILATERAL  few yrs ago   Bilateral  . HARDWARE REMOVAL   04/05/2012   Procedure: HARDWARE REMOVAL;  Surgeon: Gearlean Alf, MD;  Location: [facility];  Service: Orthopedics;  Laterality: Right;  Removal of Hardware Right Femur   . HARDWARE REMOVAL Left 06/30/2013   Procedure: LEFT HIP HARDWARE REMOVAL OF CABLES;  Surgeon: Gearlean Alf, MD;  Location: [facility];  Service: Orthopedics;  Laterality: Left;  . HARDWARE REMOVAL Right 08/21/2013   Procedure: RIGHT HIP HARDWARE REMOVAL;  Surgeon: Gearlean Alf, MD;  Location: [facility];  Service: Orthopedics;  Laterality: Right;  . JOINT REPLACEMENT  07-27-11   Bilateral: Right THA - 04/2000, Left THA 06/2001  . left hip replacement Left feb 2003  . LOOP RECORDER IMPLANT  04-08-2014   MDT LINQ implanted by Dr Lovena Le for cryptogenic stroke  . LOOP RECORDER REMOVAL N/A 04/26/2017   Procedure: LOOP RECORDER REMOVAL;  Surgeon: Evans Lance, MD;  Location: [place] CV LAB;  Service: Cardiovascular;  Laterality: N/A;  . MASTECTOMY  1989   left - Pt states has no restrictions on use of L arm for BP or blood draw  . melanoma removed Left    x 1  . ORIF FEMUR FRACTURE  07/31/2011   Procedure: OPEN REDUCTION INTERNAL FIXATION (ORIF) DISTAL FEMUR FRACTURE;  Surgeon: Gearlean Alf, MD;  Location: [facility];  Service: Orthopedics;  Laterality: Right;  right  femur  (c-arm)   . SQUAMOUS CELL CARCINOMA EXCISION  07-27-11   x2 left shoulder  . TOTAL HIP REVISION  04/10/2012   Procedure: TOTAL HIP REVISION;  Surgeon: Gearlean Alf, MD;  [facility]: [facility];  Service: Orthopedics;  Laterality: Right;  . TOTAL HIP REVISION Left 06/27/2012   Procedure: LEFT TOTAL HIP REVISION;  Surgeon: Gearlean Alf, MD;  Location: [facility];  Service: Orthopedics;  Laterality: Left;    Allergies: Lactose intolerance (gi) and Epinephrine  Medications: Prior to Admission medications   Medication Sig Start Date End Date Taking? Authorizing Provider  amLODipine (NORVASC) 5 MG tablet TAKE 1 TABLET(5 MG) BY MOUTH EVERY MORNING Patient taking  differently: Take 5 mg by mouth daily. TAKE 1 TABLET(5 MG) BY MOUTH EVERY MORNING 09/26/19  Yes Henson, Vickie L, NP-C  Coenzyme Q10 (COQ-10 PO) Take 1 tablet by mouth daily.   Yes [provider]  docusate sodium (COLACE) 100 MG capsule Take 100 mg by mouth daily.   Yes [provider]  ELIQUIS 2.5 MG TABS tablet TAKE 1 TABLET TWICE DAILY Patient taking differently: Take 2.5 mg by mouth 2 (two) times daily.  09/29/19  Yes Henson, Vickie L, NP-C  hydrochlorothiazide (MICROZIDE) 12.5 MG capsule TAKE 1 CAPSULE(12.5 MG) BY MOUTH DAILY Patient taking differently: Take 12.5 mg by mouth daily. TAKE 1 CAPSULE(12.5 MG) BY MOUTH DAILY 09/26/19  Yes Henson, Vickie L, NP-C  levothyroxine (SYNTHROID) 100 MCG tablet Take 1 tablet (100 mcg total) by mouth daily. 06/25/19  Yes Henson, Vickie L, NP-C  Multiple Vitamins-Iron (MULTIVITAMINS WITH IRON) TABS tablet Take 1 tablet by mouth daily.    Yes [provider]  Omega-3 Fatty Acids (FISH OIL PO) Take 2 capsules by mouth.    Yes [provider]  Probiotic Product (PROBIOTIC PO) Take 1 capsule by mouth daily.   Yes [provider]  Propylene Glycol (SYSTANE BALANCE OP) Apply 1 drop to eye in the morning and at bedtime.    Yes [provider]  Vitamin D, Cholecalciferol, 1000 UNITS TABS Take 1,000 Units by mouth daily.    Yes [provider]     Family History  Problem Relation Age of Onset  . Hodgkin's lymphoma Mother   . Celiac disease Mother   . Cancer Mother   . Melanoma Father   . Cancer Father   . Cancer Brother   . Arthritis Sister   . Ulcerative colitis Sister   . Heart disease Paternal Grandmother   . Heart disease Paternal Grandfather     Social History   Socioeconomic History  . Marital status: Single    Spouse name: Not on file  . Number of children: 1  . Years of education: Not on file  . Highest education level: Not on file  Occupational History  . Occupation: Retired    Tobacco Use  . Smoking status: Former Smoker    Packs/day: 1.00    Years: 10.00    Pack years: 10.00    Types: Cigarettes    Quit date: 05/09/1963    Years since quitting: 56.6  . Smokeless tobacco: Never Used  Vaping Use  . Vaping Use: Never used  Substance and Sexual Activity  . Alcohol use: Yes    Alcohol/week: 1.0 standard drink    Types: 1 Glasses of wine per week    Comment: very little  . Drug use: No  . Sexual activity: Never  Other Topics Concern  . Not on file  Social History Narrative   Lives alone.   Social Determinants of Health   Financial Resource Strain:   . Difficulty of Paying Living Expenses:   Food Insecurity:   . Worried About Charity fundraiser in the Last Year:   . Arboriculturist in the Last Year:   Transportation Needs:   . Film/video editor (Medical):   Marland Kitchen Lack of Transportation (Non-Medical):   Physical Activity:   . Days of Exercise per Week:   . Minutes of Exercise per Session:   Stress:   . Feeling of Stress :   Social Connections:   . Frequency of Communication with Friends and Family:   . Frequency of Social Gatherings with Friends and Family:   . Attends Religious Services:   . Active Member of Clubs or Organizations:   . Attends Archivist Meetings:   Marland Kitchen Marital Status:     Review of Systems: A 12 point ROS discussed and pertinent positives are indicated in the HPI above.  All other systems are negative.  Review of Systems  Constitutional: Positive for activity change, appetite change and fatigue.  Respiratory: Negative for cough and shortness of breath.   Cardiovascular: Negative for chest pain and leg swelling.  Gastrointestinal: Positive for diarrhea. Negative for abdominal pain.  Musculoskeletal: Negative for back pain.  Neurological: Positive for weakness and headaches.    Vital Signs: BP (!) 108/59 (BP Location: Right Arm)   Pulse 52   Temp 98.1 F (36.7 C) (Oral)   Resp 16   Ht 5\' 5"  (1.651 m)   Wt  125 lb (56.7 kg)   SpO2 96%   BMI 20.80 kg/m   Physical Exam Constitutional:      General: She is not in acute distress. HENT:     Mouth/Throat:     Mouth: Mucous membranes are moist.     Pharynx: Oropharynx is clear.  Cardiovascular:     Rate and Rhythm: Normal rate and regular rhythm.     Pulses: Normal pulses.     Heart sounds: Normal heart sounds.  Pulmonary:     Effort: Pulmonary effort is normal.     Breath sounds: Normal breath sounds.  Abdominal:     General: Bowel sounds are normal.     Palpations: Abdomen is soft.     Tenderness: There is no abdominal tenderness.  Musculoskeletal:        General: Normal range of motion.  Skin:    General: Skin is warm and dry.  Neurological:     Mental Status: She is alert and oriented to person, place, and time.     Imaging: CT Abdomen Pelvis W Contrast  Result Date: 11/28/2019 CLINICAL DATA:  86 year old female with acute abdominal pain and fever. EXAM: CT ABDOMEN AND PELVIS WITH CONTRAST TECHNIQUE: Multidetector CT imaging of the abdomen and pelvis was performed using the standard protocol following bolus administration of intravenous contrast. CONTRAST:  158mL OMNIPAQUE IOHEXOL 300 MG/ML  SOLN COMPARISON:  None. FINDINGS: Bilateral hip replacement artifact obscures detail within the pelvis. Lower chest: Cardiomegaly and subsegmental atelectasis/scarring in the lung bases identified. Hepatobiliary: Scattered hepatic cysts are present. Mild periportal edema is identified. No other hepatic abnormalities are present. Gallbladder is unremarkable.  No biliary dilatation. Pancreas: Unremarkable Spleen: Unremarkable Adrenals/Urinary Tract: The kidneys, adrenal glands and bladder are unremarkable. Stomach/Bowel: Stomach is within normal limits. Appendix appears normal. No evidence of bowel wall thickening, distention, or inflammatory changes. Vascular/Lymphatic: Aortic atherosclerosis. No enlarged abdominal  or pelvic lymph nodes. Reproductive:  No definite abnormality but hip arthroplasty artifact obscures detail. Other: A small amount of free pelvic fluid is nonspecific. There is no evidence of pneumoperitoneum or focal collection. Musculoskeletal: No acute or suspicious bony abnormalities are identified. Bilateral hip arthroplasties are noted. IMPRESSION: 1. Small amount of free pelvic fluid, nonspecific. 2. Cardiomegaly and subsegmental atelectasis/scarring in the lung bases. 3. Aortic Atherosclerosis (ICD10-I70.0). Electronically Signed   By: Margarette Canada M.D.   On: 11/28/2019 11:19   DG Chest Port 1 View  Result Date: 11/28/2019 CLINICAL DATA:  Fevers and weakness EXAM: PORTABLE CHEST 1 VIEW COMPARISON:  03/10/2014 FINDINGS: Cardiac shadow is stable. Aortic calcifications are again noted. Lungs are clear bilaterally. No focal infiltrate or sizable effusion is noted. Nipple shadow is noted over right base. No new focal abnormality is seen. IMPRESSION: No acute abnormality noted. Electronically Signed   By: Inez Catalina M.D.   On: 11/28/2019 10:23    Labs:  CBC: Recent Labs    11/27/19 1530 11/28/19 0942 11/28/19 1309 11/29/19 0630 11/30/19 0948  WBC 1.8* 1.9*  --  3.1* 4.5  HGB 11.0* 12.1  --  10.9* 10.6*  HCT 30.6* 36.1  --  32.4* 31.1*  PLT Comment 41* 39* 42* 54*    COAGS: Recent Labs    11/28/19 0942 11/28/19 1309  INR 1.2 1.1  APTT 35 35    BMP: Recent Labs    11/28/19 0942 11/28/19 1506 11/29/19 0630 11/30/19 0647  NA 122* 128* 129* 130*  K 3.1* 3.6 3.1* 4.0  CL 87* 92* 95* 99  CO2 25 26 23 23   GLUCOSE 110* 101* 87 89  BUN 14 11 10 11   CALCIUM 8.2* 8.0* 7.7* 7.9*  CREATININE 0.65 0.53 0.58 0.58  GFRNONAA >60 >60 >60 >60  GFRAA >60 >60 >60 >60    LIVER FUNCTION TESTS: Recent Labs    07/24/19 0909 11/27/19 1530 11/28/19 0942 11/29/19 0630  BILITOT 0.4 0.4 0.7 0.5  AST 20 51* 59* 49*  ALT 14 30 37 29  ALKPHOS 74 44* 42 36*  PROT 6.5 5.4* 6.0* 5.0*  ALBUMIN 4.4 3.6 3.8 2.9*     Assessment and Plan:  Febrile neutropenia and thrombocytopenia: [name]. [name], 86 year old female, is scheduled for an image-guided bone marrow aspiration and biopsy 12/01/19. She presented to the ED with syncope, fevers, nausea and diarrhea on 11/28/19 and was found to have abnormal lab work.   Risks and benefits of a bone marrow aspiration and biopsy were discussed with the patient including, but not limited to bleeding, infection, damage to adjacent structures or low yield requiring additional tests.  All of the questions were answered and there is agreement to proceed.  She will be NPO after midnight.   Consent signed and in the IR APP office at [facility]   Thank you for this interesting consult.  I greatly enjoyed meeting [name] and look forward to participating in their care.  A copy of this report was sent to the requesting provider on this date.  Electronically Signed: Soyla Dryer, AGACNP-BC [phone] 11/30/2019, 2:15 PM   I spent a total of 20 Minutes    in face to face in clinical consultation, greater than 50% of which was counseling/coordinating care for image-guided bone marrow aspiration and biopsy.

## 2019-11-30 NOTE — Evaluation (Signed)
Physical Therapy Evaluation Patient Details Name: [name] MRN: [id number] DOB: 01-30-1932 Today's Date: 11/30/2019   History of Present Illness  Pt admitted with weakness, neutropenia, Thrombocytopenia, Hypokcalemia, and Hyponatremia  Clinical Impression  Pt admitted as above and presenting with functional mobility 2* generalized weakness, ambulatory balance deficits and limited endurance.  Pt very motivated but requiring increased times and multiple rest breaks for performance of all tasks 2* fatigue.  Pt hopes to progress to dc home - dtr lives next door.    Follow Up Recommendations Home health PT    Equipment Recommendations  None recommended by PT    Recommendations for Other Services       Precautions / Restrictions Precautions Precautions: Fall Restrictions Weight Bearing Restrictions: No      Mobility  Bed Mobility               General bed mobility comments: Pt up in chair and requests back to same  Transfers Overall transfer level: Needs assistance Equipment used: Rolling walker (2 wheeled) Transfers: Sit to/from Stand Sit to Stand: Min assist         General transfer comment: cues for use of UEs to self assist; physical assist to bring wt up and fwd and to balance in initial standing  Ambulation/Gait Ambulation/Gait assistance: Min assist Gait Distance (Feet): 58 Feet Assistive device: Rolling walker (2 wheeled) Gait Pattern/deviations: Step-to pattern;Step-through pattern;Decreased step length - right;Decreased step length - left;Shuffle;Trunk flexed Gait velocity: decr   General Gait Details: Increased time with multiple standing rest breaks and cues for posture and position from [employer]            Wheelchair Mobility    Modified Rankin (Stroke Patients Only)       Balance Overall balance assessment: Needs assistance Sitting-balance support: No upper extremity supported;Feet supported Sitting balance-Leahy Scale: Good      Standing balance support: Bilateral upper extremity supported Standing balance-Leahy Scale: Poor                               Pertinent Vitals/Pain Pain Assessment: No/denies pain    Home Living Family/patient expects to be discharged to:: Private residence Living Arrangements: Alone Available Help at Discharge: Family;Available PRN/intermittently (Daughter lives next door) Type of Home: House Home Access: Level entry     Home Layout: One level Home Equipment: Environmental consultant - 2 wheels      Prior Function Level of Independence: Independent         Comments: Pt is very active; hikes multiple times a week using poles     Hand Dominance   Dominant Hand: Right    Extremity/Trunk Assessment   Upper Extremity Assessment Upper Extremity Assessment: Generalized weakness    Lower Extremity Assessment Lower Extremity Assessment: Generalized weakness       Communication   Communication: No difficulties  Cognition Arousal/Alertness: Awake/alert Behavior During Therapy: WFL for tasks assessed/performed Overall Cognitive Status: Within Functional Limits for tasks assessed                                        General Comments      Exercises     Assessment/Plan    PT Assessment Patient needs continued PT services  PT Problem List Decreased strength;Decreased activity tolerance;Decreased balance;Decreased mobility;Decreased knowledge of use of DME  PT Treatment Interventions DME instruction;Gait training;Functional mobility training;Therapeutic exercise;Therapeutic activities;Patient/family education;Balance training    PT Goals (Current goals can be found in the Care Plan section)  Acute Rehab PT Goals Patient Stated Goal: Regain IND PT Goal Formulation: With patient Time For Goal Achievement: 12/14/19 Potential to Achieve Goals: Good    Frequency Min 3X/week   Barriers to discharge        Co-evaluation                AM-PAC PT "6 Clicks" Mobility  Outcome Measure Help needed turning from your back to your side while in a flat bed without using bedrails?: None Help needed moving from lying on your back to sitting on the side of a flat bed without using bedrails?: A Little Help needed moving to and from a bed to a chair (including a wheelchair)?: A Little Help needed standing up from a chair using your arms (e.g., wheelchair or bedside chair)?: A Little Help needed to walk in hospital room?: A Little Help needed climbing 3-5 steps with a railing? : A Lot 6 Click Score: 18    End of Session Equipment Utilized During Treatment: Gait belt Activity Tolerance: Patient limited by fatigue Patient left: in chair;with call bell/phone within reach;with chair alarm set Nurse Communication: Mobility status PT Visit Diagnosis: Muscle weakness (generalized) (M62.81);Difficulty in walking, not elsewhere classified (R26.2)    Time: 4373-5789 PT Time Calculation (min) (ACUTE ONLY): 25 min   Charges:   PT Evaluation $PT Eval Low Complexity: 1 Low PT Treatments $Gait Training: 8-22 mins        Debe Coder PT Acute Rehabilitation Services Pager [phone] Office [phone]   Tanith Dagostino 11/30/2019, 9:15 AM

## 2019-11-30 NOTE — Progress Notes (Signed)
PROGRESS NOTE  [name] EEF:[id number] DOB: 14-Apr-1932 DOA: 11/28/2019 PCP: Girtha Rm, NP-C  Brief History   89yow PMH afib, breast cancer s/p mastectomy, currently in remission, presented w/ diarrhea, nausea, fever to 102, headache, syncope. Admitted for febrile neutropenia and thrombocytopenia  A & P  Febrile neutropenia, thrombocytopenia, both acute --etiology unclear, remains afebrile with stable hemodynamics. DDx includes rickettsial disease (doubted), nutritional deficiency, or possible hematological malignancy. --COVID negative --follow culture data, UC NG, BC pending --WBC up to 4.5 today --continue empiric cefepime and doxycycline, can change to oral tomorrow --per hematology: f/u peripheral blood smear review by hematology; as well as copper, DIC panel interpretation by hematology. Follow-up rickettsia panel. B12, folate WNL, hepatitis B and C nonreactive --discussed with Dr. Lorenso Courier, recommends bone marrow biopsy 7/26    Hypovolemic hyponatremia --in context of acute illness, HCTZ --hold HCTZ --better today --expect spontaneous resolution  Hypokalemia --repleted  Hypophosphatemia --replete  Generalized weakness --PT eval  PAF --apixaban on hold secondary to thrombocytopenia until >50k --SCDs  Syncope --most likely from acute illness; no concerning features. Continue telemetry.   Disposition Plan:  Discussion: no s/s of sepsis. Will downgrade to telemetry. Continue abx, replace K+ and sodium; labs in AM.  Status is: Inpatient  Remains inpatient appropriate because:IV treatments appropriate due to intensity of illness or inability to take PO   Dispo: The patient is from: Home              Anticipated d/c is to: Home              Anticipated d/c date is: 2 days              Patient currently is not medically stable to d/c.  DVT prophylaxis: SCDs Start: 11/28/19 1412 Code Status: Full Family Communication: none present or requested  Murray Hodgkins, MD  [facility] Direct contact: see [email or web address] (further directions at bottom of note if needed) 7PM-7AM contact night coverage as at bottom of note 11/30/2019, 4:54 PM  LOS: 2 days   Significant Hospital Events   . Admitted 7/23   Consults:  . Hematology  . IR   Procedures:  .   Significant Diagnostic Tests:  Marland Kitchen    Micro Data:  . BC > NGTD . UC > NG/F   Antimicrobials:  . cefepine 7/23 > . Doxycycline 7/23 >   Interval History/Subjective  Food is terrible.  Objective   Vitals:  Vitals:   11/30/19 0507 11/30/19 1532  BP: (!) 108/59 (!) 123/55  Pulse: 52 62  Resp: 16 18  Temp: 98.1 F (36.7 C) 98.6 F (37 C)  SpO2: 96% 95%    Exam:  Constitutional:   . Appears calm and comfortable Respiratory:  . CTA bilaterally, no w/r/r.  . Respiratory effort normal.  Cardiovascular:  . RRR, no m/r/g Skin:  . Dry erythematous rash over mid-spine. No bulla or vesicles. Looks like old abrasion. Psychiatric:  . Mental status o Mood irritable, affect odd  I have personally reviewed the following:   Today's Data  . UOP 1400 . Na+ 129 > 130 . Phos 1.7 . WBC 3.1 > 4.5 . Hgb stable at 10.6 . Plts 42 > 54  Scheduled Meds: . amLODipine  5 mg Oral Daily  . doxycycline  100 mg Oral Q12H  . levothyroxine  100 mcg Oral Daily  . polyethylene glycol  17 g Oral BID  . polyvinyl alcohol  1 drop Both Eyes BID  . senna  1 tablet Oral QHS  . sodium chloride flush  3 mL Intravenous Q12H   Continuous Infusions: . ceFEPime (MAXIPIME) IV 2 g (11/30/19 1222)    Principal Problem:   Febrile neutropenia (HCC) Active Problems:   Hyponatremia   Generalized weakness   LOS: 2 days   How to contact the [facility] Attending or Consulting provider [place] or covering provider during after hours [place], for this patient?  1. Check the care team in [facility] and look for a) attending/consulting TR[facility] provider listed and b) the [facility] team listed 2. Log into [email or web address] and use  [place]'s universal password to access. If you do not have the password, please contact the hospital operator. 3. Locate the [facility] provider you are looking for under [facility] and page to a number that you can be directly reached. 4. If you still have difficulty reaching the provider, please page the [facility] (Director on Call) for the Hospitalists listed on amion for assistance.

## 2019-12-01 ENCOUNTER — Telehealth: Payer: Self-pay | Admitting: Hematology and Oncology

## 2019-12-01 DIAGNOSIS — D696 Thrombocytopenia, unspecified: Secondary | ICD-10-CM

## 2019-12-01 DIAGNOSIS — D72819 Decreased white blood cell count, unspecified: Secondary | ICD-10-CM

## 2019-12-01 LAB — CBC WITH DIFFERENTIAL/PLATELET
Abs Immature Granulocytes: 0.02 10*3/uL (ref 0.00–0.07)
Basophils Absolute: 0.1 10*3/uL (ref 0.0–0.1)
Basophils Relative: 1 %
Eosinophils Absolute: 0.1 10*3/uL (ref 0.0–0.5)
Eosinophils Relative: 1 %
HCT: 33.7 % — ABNORMAL LOW (ref 36.0–46.0)
Hemoglobin: 11.3 g/dL — ABNORMAL LOW (ref 12.0–15.0)
Immature Granulocytes: 0 %
Lymphocytes Relative: 56 %
Lymphs Abs: 2.8 10*3/uL (ref 0.7–4.0)
MCH: 31 pg (ref 26.0–34.0)
MCHC: 33.5 g/dL (ref 30.0–36.0)
MCV: 92.6 fL (ref 80.0–100.0)
Monocytes Absolute: 0.6 10*3/uL (ref 0.1–1.0)
Monocytes Relative: 11 %
Neutro Abs: 1.6 10*3/uL — ABNORMAL LOW (ref 1.7–7.7)
Neutrophils Relative %: 31 %
Platelets: 80 10*3/uL — ABNORMAL LOW (ref 150–400)
RBC: 3.64 MIL/uL — ABNORMAL LOW (ref 3.87–5.11)
RDW: 14.9 % (ref 11.5–15.5)
WBC: 5.1 10*3/uL (ref 4.0–10.5)
nRBC: 0 % (ref 0.0–0.2)

## 2019-12-01 LAB — BASIC METABOLIC PANEL
Anion gap: 8 (ref 5–15)
BUN: 10 mg/dL (ref 8–23)
CO2: 24 mmol/L (ref 22–32)
Calcium: 8.2 mg/dL — ABNORMAL LOW (ref 8.9–10.3)
Chloride: 100 mmol/L (ref 98–111)
Creatinine, Ser: 0.49 mg/dL (ref 0.44–1.00)
GFR calc Af Amer: >60 mL/min (ref >60)
GFR calc non Af Amer: >60 mL/min (ref >60)
Glucose, Bld: 107 mg/dL — ABNORMAL HIGH (ref 70–99)
Potassium: 3.9 mmol/L (ref 3.5–5.1)
Sodium: 132 mmol/L — ABNORMAL LOW (ref 135–145)

## 2019-12-01 LAB — PHOSPHORUS: Phosphorus: 2.3 mg/dL — ABNORMAL LOW (ref 2.5–4.6)

## 2019-12-01 LAB — B. BURGDORFI ANTIBODIES: B burgdorferi Ab IgG+IgM: <0.91 {ISR} (ref 0.00–0.90)

## 2019-12-01 MED ORDER — DOXYCYCLINE HYCLATE 100 MG PO TABS: 100.0000 mg | ORAL_TABLET | Freq: Two times a day (BID) | ORAL | 0 refills | Status: DC

## 2019-12-01 MED ORDER — K PHOS MONO-SOD PHOS DI & MONO 155-852-130 MG PO TABS
500.0000 mg | ORAL_TABLET | Freq: Three times a day (TID) | ORAL | Status: DC
  Administered 2019-12-01: 500 mg via ORAL
  Filled 2019-12-01 (×2): qty 2

## 2019-12-01 NOTE — TOC Progression Note (Signed)
Transition of Care [facility]) - Progression Note    Patient Details  Name: [name] MRN: [id number] Date of Birth: February 14, 1932  Transition of Care [facility]) CM/SW Contact  Joaquin Courts, RN Phone Number: 12/01/2019, 3:04 PM  Clinical Narrative:    Patient reports she is now in agreement to have [place] services.  Patient set up with [facility] for [place].    Expected Discharge Plan: Home/Self Care Barriers to Discharge: No Barriers Identified  Expected Discharge Plan and Services Expected Discharge Plan: Home/Self Care   Discharge Planning Services: CM Consult Post Acute Care Choice: [place] arrangements for the past 2 months: Single Family Home Expected Discharge Date: 12/01/19               DME Arranged: N/A DME Agency: NA       HH Arranged: PT HH Agency: [place] Date [facility] Agency Contacted: 12/01/19 Time [place]: 1504 Representative spoke with at [place]: [place] ([place]) Interventions    Readmission Risk Interventions No flowsheet data found.

## 2019-12-01 NOTE — TOC Initial Note (Signed)
Transition of Care [facility]) - Initial/Assessment Note    Patient Details  Name: [name] MRN: [id number] Date of Birth: [birth year]  Transition of Care (TOC) CM/SW Contact:    Joaquin Courts, RN Phone Number: 12/01/2019, 2:21 PM  Clinical Narrative:        CM spoke with patient regarding recommendation for HHPT.  Patient reports she goes to [facility] for OPPT services.  CM explained the differences between [facility] and outpatient PT services.  Patient expresses that she wishes to [facility] and does not want [facility] at this time.            Expected Discharge Plan: Home/Self Care Barriers to Discharge: No Barriers Identified   Patient Goals and CMS Choice Patient states their goals for this hospitalization and ongoing recovery are:: to go home today CMS Medicare.gov Compare Post Acute Care list provided to:: Patient Choice offered to / list presented to : Patient  Expected Discharge Plan and Services Expected Discharge Plan: Home/Self Care   Discharge Planning Services: CM Consult Post Acute Care Choice: [place] arrangements for the past 2 months: Single Family Home Expected Discharge Date: 12/01/19               DME Arranged: N/A DME Agency: NA       HH Arranged: Refused [place] Agency: NA        Prior Living Arrangements/Services Living arrangements for the past 2 months: Single Family Home Lives with:: Self Patient language and need for interpreter reviewed:: Yes Do you feel safe going back to the place where you live?: Yes      Need for Family Participation in Patient Care: Yes (Comment) Care giver support system in place?: Yes (comment)   Criminal Activity/Legal Involvement Pertinent to Current Situation/Hospitalization: No - Comment as needed  Activities of Daily Living Home Assistive Devices/Equipment: Eyeglasses, Hearing aid, Walker (specify type) (bilateral hearing aids, front wheeled walker) ADL Screening  (condition at time of admission) Patient's cognitive ability adequate to safely complete daily activities?: Yes Is the patient deaf or have difficulty hearing?: Yes (wears bilateral hearing aids) Does the patient have difficulty seeing, even when wearing glasses/contacts?: No Does the patient have difficulty concentrating, remembering, or making decisions?: Yes Patient able to express need for assistance with ADLs?: Yes Does the patient have difficulty dressing or bathing?: No Independently performs ADLs?: No Communication: Independent Is this a change from baseline?: Pre-admission baseline Dressing (OT): Independent Grooming: Independent Feeding: Independent Bathing: Independent Toileting: Needs assistance Is this a change from baseline?: Change from baseline, expected to last >3days In/Out Bed: Needs assistance Is this a change from baseline?: Change from baseline, expected to last >3 days Walks in Home: Needs assistance Is this a change from baseline?: Change from baseline, expected to last >3 days Does the patient have difficulty walking or climbing stairs?: Yes (secondary to weakness) Weakness of Legs: Both Weakness of Arms/Hands: None  Permission Sought/Granted                  Emotional Assessment Appearance:: Appears stated age Attitude/Demeanor/Rapport: Engaged Affect (typically observed): Accepting Orientation: : Oriented to Self, Oriented to Place, Oriented to Situation, Oriented to  Time   Psych Involvement: No (comment)  Admission diagnosis:  Hypokalemia [E87.6] Hyponatremia [E87.1] Febrile neutropenia ([facility]) [D70.9, R50.81] Sepsis, due to unspecified organism, unspecified whether acute organ dysfunction present [facility]) [A41.9] Patient Active Problem List   Diagnosis Date Noted  . Hyponatremia 11/29/2019  . Generalized  weakness 11/29/2019  . Febrile neutropenia ([place]) 11/28/2019  . History of loop recorder 11/20/2019  . Plantar wart 07/24/2019  . Statin  declined 07/24/2019  . Vitamin D deficiency 01/14/2019  . History of CVA (cerebrovascular accident) without residual deficits 01/14/2019  . Nocturnal leg cramps 01/14/2019  . Chronic constipation 01/14/2019  . Blepharitis of left eye   . Decreased muscle strength 07/11/2018  . Hyperkalemia 03/07/2018  . Atrial fibrillation ([place]) 04/15/2015  . Dyslipidemia 04/15/2015  . Stroke of unknown cause ([place]) 04/08/2014  . Acute CVA (cerebrovascular accident) ([place]) 03/10/2014  . Hypertension 03/10/2014  . Failed total hip arthroplasty ([place]) 06/27/2012  . Painful orthopaedic hardware ([place]) 04/05/2012  . Peri-prosthetic fracture of femur following total hip arthroplasty 07/31/2011  . Hypothyroidism 07/27/2011   PCP:  Girtha Rm, NP-C Pharmacy:   [facility], [place] [address] [place] [address] Phone: [phone] Fax: [phone]  [facility] [place], [place] [address] Phone: [phone] Fax: [phone]     Social Determinants of Health (SDOH) Interventions    Readmission Risk Interventions No flowsheet data found.

## 2019-12-01 NOTE — Telephone Encounter (Signed)
Rescheduled appointments to reflect New Patient time slot. Left message on patient's voicemail with updated appointment times.

## 2019-12-01 NOTE — Telephone Encounter (Signed)
Scheduled appointments per 7/26 provider message. Left message on patient voicemail with appointments date and time.

## 2019-12-01 NOTE — Discharge Summary (Signed)
Physician Discharge Summary  [name]:[id number] DOB: [birth year] DOA: 11/28/2019  PCP: Girtha Rm, NP-C  Admit date: 11/28/2019 Discharge date: 12/01/2019  Recommendations for Outpatient Follow-up:    Febrile neutropenia, thrombocytopenia, both acute --Leukopenia and thrombocytopenia spontaneously recovered --f/u peripheral blood smear review, copper, DIC panel interpretation by hematology. Follow-up rickettsia panel.   Hypovolemic hyponatremia --HCTZ discontinued    Follow-up Information    Henson, Vickie L, NP-C. Schedule an appointment as soon as possible for a visit in 2 week(s).   Specialty: Family Medicine Contact information: [place] [address] [phone]        Orson Slick, MD Follow up.   Specialty: Hematology and Oncology Why: Office will contact you with appointment Contact information: [place]. [place] [address] [phone]                Discharge Diagnoses: Principal diagnosis is #1  1. Febrile neutropenia, thrombocytopenia, both acute 2. Hypovolemic hyponatremia 3. Hypokalemia 4. Hypophosphatemia 5. Generalized weakness 6. PAF 7. Syncope  Discharge Condition: improved Disposition: home w/ HHPT  Diet recommendation: low sodium heart healthy  Filed Weights   11/28/19 0901  Weight: 56.7 kg    History of present illness:  31yow PMH afib, breast cancer s/p mastectomy, currently in remission, presented w/ diarrhea, nausea, fever to 102, headache, syncope. Admitted for febrile neutropenia and thrombocytopenia  Hospital Course:  Patient was admitted, treated with empiric antibiotics, seen by oncology.  Treated with doxycycline in case of tick exposure unconfirmed as well as cefepime.  Cultures unrevealing.  Tick studies still pending.  Initial plans were made for bone marrow biopsy but leukopenia and thrombocytopenia recovered.  In discussion with hematology, bone marrow biopsy was  canceled patient was discharged home in good condition.  She will follow up with hematology as an outpatient to recheck blood counts.  Etiology of neutropenia and thrombocytopenia unclear at this time.  Hematology recommended discharge home on doxycycline until tick studies were back.  Individual issues as below.  Febrile neutropenia, thrombocytopenia, both acute --etiology unclear, remained afebrile with stable hemodynamics. DDx included rickettsial disease (doubted), nutritional deficiency, or possible hematological malignancy. --COVID negative --culture data unrevealing --Leukopenia and thrombocytopenia spontaneously recovered --per hematology: f/u peripheral blood smear review by hematology; as well as copper, DIC panel interpretation by hematology. Follow-up rickettsia panel. B12, folate WNL, hepatitis B and C nonreactive  Hypovolemic hyponatremia --in context of acute illness, HCTZ --hold HCTZ --better today --expect spontaneous resolution  Hypokalemia --repleted  Hypophosphatemia --replete  Generalized weakness --PT eval  PAF --apixaban on hold secondary to thrombocytopenia until >50k --SCDs  Syncope --most likely from acute illness; no concerning features. Continue telemetry.   Significant Hospital Events    Admitted 7/23  Consults:   Hematology   IR  Procedures:   None  Significant Diagnostic Tests:   as below  Micro Data:   BC > NGTD  UC > NG/F  Antimicrobials:   cefepine 7/23 > 7/26  Doxycycline 7/23 >   Today's assessment: S: feels better, wants to go home O: Vitals:  Vitals:   11/30/19 2044 12/01/19 0432  BP: (!) 141/74 (!) 143/61  Pulse: 56 58  Resp: 18 16  Temp: 97.9 F (36.6 C) 98.4 F (36.9 C)  SpO2: 96% 98%    Constitutional:   Appears calm and comfortable Respiratory:   CTA bilaterally, no w/r/r.   Respiratory effort normal.  Cardiovascular:   RRR, no m/r/g  No LE extremity edema  Psychiatric:     Mental status o Mood, affect appropriate  Na up to 132 Phos 2.3 expect spontaneous resolution Hgb 11.3 stable Plts up to 80 BC NGTD  Discharge Instructions  Discharge Instructions    Activity as tolerated - No restrictions   Complete by: As directed    Ambulatory referral to Hematology / Oncology   Complete by: As directed    Diet - low sodium heart healthy   Complete by: As directed    Discharge instructions   Complete by: As directed    Call your physician or seek immediate medical attention for fever, pain, bleeding, bruising, rash or worsening of condition.     Allergies as of 12/01/2019      Reactions   Lactose Intolerance (gi)    Bloating, gas   Epinephrine Other (See Comments)    Reaction:  Caused pt to scream uncontrollably.      Medication List    STOP taking these medications   hydrochlorothiazide 12.5 MG capsule Commonly known as: MICROZIDE     TAKE these medications   amLODipine 5 MG tablet Commonly known as: NORVASC TAKE 1 TABLET(5 MG) BY MOUTH EVERY MORNING What changed:   how much to take  how to take this  when to take this   COQ-10 PO Take 1 tablet by mouth daily.   docusate sodium 100 MG capsule Commonly known as: COLACE Take 100 mg by mouth daily.   doxycycline 100 MG tablet Commonly known as: VIBRA-TABS Take 1 tablet (100 mg total) by mouth every 12 (twelve) hours.   Eliquis 2.5 MG Tabs tablet Generic drug: apixaban TAKE 1 TABLET TWICE DAILY What changed: how much to take   FISH OIL PO Take 2 capsules by mouth.   levothyroxine 100 MCG tablet Commonly known as: SYNTHROID Take 1 tablet (100 mcg total) by mouth daily.   multivitamins with iron Tabs tablet Take 1 tablet by mouth daily.   PROBIOTIC PO Take 1 capsule by mouth daily.   SYSTANE BALANCE OP Apply 1 drop to eye in the morning and at bedtime.   Vitamin D (Cholecalciferol) 25 MCG (1000 UT) Tabs Take 1,000 Units by mouth daily.      Allergies  Allergen  Reactions   Lactose Intolerance (Gi)     Bloating, gas   Epinephrine Other (See Comments)     Reaction:  Caused pt to scream uncontrollably.    The results of significant diagnostics from this hospitalization (including imaging, microbiology, ancillary and laboratory) are listed below for reference.    Significant Diagnostic Studies: CT Abdomen Pelvis W Contrast  Result Date: 11/28/2019 CLINICAL DATA:  86 year old female with acute abdominal pain and fever. EXAM: CT ABDOMEN AND PELVIS WITH CONTRAST TECHNIQUE: Multidetector CT imaging of the abdomen and pelvis was performed using the standard protocol following bolus administration of intravenous contrast. CONTRAST:  185m OMNIPAQUE IOHEXOL 300 MG/ML  SOLN COMPARISON:  None. FINDINGS: Bilateral hip replacement artifact obscures detail within the pelvis. Lower chest: Cardiomegaly and subsegmental atelectasis/scarring in the lung bases identified. Hepatobiliary: Scattered hepatic cysts are present. Mild periportal edema is identified. No other hepatic abnormalities are present. Gallbladder is unremarkable.  No biliary dilatation. Pancreas: Unremarkable Spleen: Unremarkable Adrenals/Urinary Tract: The kidneys, adrenal glands and bladder are unremarkable. Stomach/Bowel: Stomach is within normal limits. Appendix appears normal. No evidence of bowel wall thickening, distention, or inflammatory changes. Vascular/Lymphatic: Aortic atherosclerosis. No enlarged abdominal or pelvic lymph nodes. Reproductive: No definite abnormality but hip arthroplasty artifact obscures detail. Other: A  small amount of free pelvic fluid is nonspecific. There is no evidence of pneumoperitoneum or focal collection. Musculoskeletal: No acute or suspicious bony abnormalities are identified. Bilateral hip arthroplasties are noted. IMPRESSION: 1. Small amount of free pelvic fluid, nonspecific. 2. Cardiomegaly and subsegmental atelectasis/scarring in the lung bases. 3. Aortic  Atherosclerosis (ICD10-I70.0). Electronically Signed   By: Margarette Canada M.D.   On: 11/28/2019 11:19   DG Chest Port 1 View  Result Date: 11/28/2019 CLINICAL DATA:  Fevers and weakness EXAM: PORTABLE CHEST 1 VIEW COMPARISON:  03/10/2014 FINDINGS: Cardiac shadow is stable. Aortic calcifications are again noted. Lungs are clear bilaterally. No focal infiltrate or sizable effusion is noted. Nipple shadow is noted over right base. No new focal abnormality is seen. IMPRESSION: No acute abnormality noted. Electronically Signed   By: Inez Catalina M.D.   On: 11/28/2019 10:23    Microbiology: Recent Results (from the past 240 hour(s))  Novel Coronavirus, NAA ([facility])     Status: None   Collection Time: 11/27/19  3:30 PM   Specimen: Nasopharyngeal(NP) swabs in vial transport medium  Result Value Ref Range Status   SARS-CoV-2, NAA Not Detected Not Detected Final    Comment: This nucleic acid amplification test was developed and its performance characteristics determined by [employer]. Nucleic acid amplification tests include RT-PCR and TMA. This test has not been FDA cleared or approved. This test has been authorized by FDA under an Emergency Use Authorization (EUA). This test is only authorized for the duration of time the declaration that circumstances exist justifying the authorization of the emergency use of in vitro diagnostic tests for detection of SARS-CoV-2 virus and/or diagnosis of COVID-19 infection under section 564(b)(1) of the Act, 21 U.S.C. 867EHM-0(N) (1), unless the authorization is terminated or revoked sooner. When diagnostic testing is negative, the possibility of a false negative result should be considered in the context of a patient's recent exposures and the presence of clinical signs and symptoms consistent with COVID-19. An individual without symptoms of COVID-19 and who is not shedding SARS-CoV-2 virus wo uld expect to have a negative (not detected) result in  this assay.   SARS-COV-2, NAA 2 DAY TAT     Status: None   Collection Time: 11/27/19  3:30 PM  Result Value Ref Range Status   SARS-CoV-2, NAA 2 DAY TAT Performed  Final  Blood Culture (routine x 2)     Status: None (Preliminary result)   Collection Time: 11/28/19 10:21 AM   Specimen: BLOOD LEFT FOREARM  Result Value Ref Range Status   Specimen Description   Final    BLOOD LEFT FOREARM Performed at [facility] [address], [place], [place] [address]    Special Requests   Final    BOTTLES DRAWN AEROBIC AND ANAEROBIC Blood Culture adequate volume Performed at [facility] [address], [place], [place] [address]    Culture   Final    NO GROWTH 3 DAYS Performed at [facility], [place] [address], [place], [place] [address]    Report Status PENDING  Incomplete  Blood Culture (routine x 2)     Status: None (Preliminary result)   Collection Time: 11/28/19 10:23 AM   Specimen: BLOOD  Result Value Ref Range Status   Specimen Description   Final    BLOOD RIGHT ANTECUBITAL Performed at [facility], [place] [address], [place], [place] [address]    Special Requests   Final    BOTTLES DRAWN  AEROBIC AND ANAEROBIC Blood Culture adequate volume Performed at [facility] [address], [place], [place] [address]    Culture   Final    NO GROWTH 3 DAYS Performed at [facility], [place] [address], [place], [place] [address]    Report Status PENDING  Incomplete  Urine culture     Status: None   Collection Time: 11/28/19 11:56 AM   Specimen: In/Out Cath Urine  Result Value Ref Range Status   Specimen Description   Final    IN/OUT CATH URINE Performed at [facility] [address] [place], [place] [address]    Special Requests   Final    NONE Performed at [facility], [place] [address] [place], [place] [address]    Culture   Final    NO GROWTH Performed at [facility], [place] [address] [place], [place] [address]    Report Status 11/29/2019 FINAL  Final  SARS Coronavirus 2 by RT PCR (hospital order, performed in [facility] hospital lab) Nasopharyngeal Urine, Clean Catch     Status: None   Collection Time: 11/28/19 11:56 AM   Specimen: Urine, Clean Catch; Nasopharyngeal  Result Value Ref Range Status   SARS Coronavirus 2 NEGATIVE NEGATIVE Final    Comment: (NOTE) SARS-CoV-2 target nucleic acids are NOT DETECTED.  The SARS-CoV-2 RNA is generally detectable in upper and lower respiratory specimens during the acute phase of infection. The lowest concentration of SARS-CoV-2 viral copies this assay can detect is 250 copies / mL. A negative result does not preclude SARS-CoV-2 infection and should not be used as the sole basis for treatment or other patient management decisions.  A negative result may occur with improper specimen collection / handling, submission of specimen other than nasopharyngeal swab, presence of viral mutation(s) within the areas targeted by this assay, and inadequate number of viral copies (<250 copies / mL). A negative result must be combined with clinical observations, patient history, and epidemiological information.  Fact Sheet for Patients:   StrictlyIdeas.no  Fact Sheet for Healthcare Providers: BankingDealers.co.za  This test is not yet approved or  cleared by the [place] FDA and has been authorized for detection and/or diagnosis of SARS-CoV-2 by FDA under an Emergency Use Authorization (EUA).  This EUA will remain in effect (meaning this test can be used) for the duration of the COVID-19 declaration under Section 564(b)(1) of the Act, 21 U.S.C. section 360bbb-3(b)(1), unless the authorization is terminated or revoked sooner.  Performed at [facility], [place] [address] [place], [place] [address]      Labs: Basic Metabolic Panel: Recent  Labs  Lab 11/28/19 [id number] 11/28/19 1506 11/29/19 0630 11/30/19 0647 12/01/19 0626  NA 122* 128* 129* 130* 132*  K 3.1* 3.6 3.1* 4.0 3.9  CL 87* 92* 95* 99 100  CO2 _0 GLUCOSE 110* 101* 87 89 107*  BUN _1 CREATININE 0.65 0.53 0.58 0.58 0.49  CALCIUM 8.2* 8.0* 7.7* 7.9* 8.2*  MG  --   --  1.8 1.9  --   PHOS  --   --   --  1.7* 2.3*   Liver Function Tests: Recent Labs  Lab 11/27/19 1530 11/28/19 0942 11/29/19 0630  AST 51* 59* 49*  ALT 30 37 29  ALKPHOS 44* 42 36*  BILITOT 0.4 0.7 0.5  PROT 5.4* 6.0* 5.0*  ALBUMIN 3.6 3.8 2.9*  CBC: Recent Labs  Lab 11/27/19 1530 11/28/19 0942 11/28/19 1309 11/29/19 0630 11/30/19 0948 12/01/19 0626  WBC 1.8* 1.9*  --  3.1* 4.5 5.1  NEUTROABS 1.3* 1.1*  --   --  1.6* 1.6*  HGB 11.0* 12.1  --  10.9* 10.6* 11.3*  HCT 30.6* 36.1  --  32.4* 31.1* 33.7*  MCV 90 92.8  --  92.6 93.7 92.6  PLT Comment 41* 39* 42* 54* 80*   CBG: Recent Labs  Lab 11/28/19 0949  GLUCAP 95    Principal Problem:   Febrile neutropenia (HCC) Active Problems:   Hyponatremia   Generalized weakness   Time coordinating discharge: 35 minutes  Signed:  Murray Hodgkins, MD  [facility]  12/01/2019, 2:54 PM

## 2019-12-01 NOTE — Progress Notes (Addendum)
HEMATOLOGY-ONCOLOGY PROGRESS NOTE  SUBJECTIVE: The patient is feeling better overall this morning.  No fevers or chills reported.  No bleeding reported.  She offers no other complaints today.  REVIEW OF SYSTEMS:   Constitutional: No recurrent fevers or chills Respiratory: Denies cough, dyspnea or wheezes Cardiovascular: Denies palpitation, chest discomfort Gastrointestinal:  Denies nausea, heartburn or change in bowel habits Skin: Denies abnormal skin rashes Lymphatics: Denies new lymphadenopathy or easy bruising Neurological:Denies numbness, tingling or new weaknesses Behavioral/Psych: Mood is stable, no new changes  Extremities: No lower extremity edema All other systems were reviewed with the patient and are negative.  I have reviewed the past medical history, past surgical history, social history and family history with the patient and they are unchanged from previous note.   PHYSICAL EXAMINATION:  Vitals:   11/30/19 2044 12/01/19 0432  BP: (!) 141/74 (!) 143/61  Pulse: 56 58  Resp: 18 16  Temp: 97.9 F (36.6 C) 98.4 F (36.9 C)  SpO2: 96% 98%   Filed Weights   11/28/19 0901  Weight: 56.7 kg    Intake/Output from previous day: 07/25 0701 - 07/26 0700 In: 1490.6 [P.O.:740; I.V.:3; IV Piggyback:747.6] Out: 725 [Urine:725]  GENERAL:alert, no distress and comfortable SKIN: skin color, texture, turgor are normal, no rashes or significant lesions LUNGS: clear to auscultation and percussion with normal breathing effort HEART: regular rate & rhythm and no murmurs and no lower extremity edema ABDOMEN:abdomen soft, non-tender and normal bowel sounds NEURO: alert & oriented x 3 with fluent speech, no focal motor/sensory deficits  LABORATORY DATA:  I have reviewed the data as listed CMP Latest Ref Rng & Units 12/01/2019 11/30/2019 11/29/2019  Glucose 70 - 99 mg/dL 107(H) 89 87  BUN 8 - 23 mg/dL 10 11 10   Creatinine 0.44 - 1.00 mg/dL 0.49 0.58 0.58  Sodium 135 - 145 mmol/L  132(L) 130(L) 129(L)  Potassium 3.5 - 5.1 mmol/L 3.9 4.0 3.1(L)  Chloride 98 - 111 mmol/L 100 99 95(L)  CO2 22 - 32 mmol/L 24 23 23   Calcium 8.9 - 10.3 mg/dL 8.2(L) 7.9(L) 7.7(L)  Total Protein 6.5 - 8.1 g/dL - - 5.0(L)  Total Bilirubin 0.3 - 1.2 mg/dL - - 0.5  Alkaline Phos 38 - 126 U/L - - 36(L)  AST 15 - 41 U/L - - 49(H)  ALT 0 - 44 U/L - - 29    Lab Results  Component Value Date   WBC 5.1 12/01/2019   HGB 11.3 (L) 12/01/2019   HCT 33.7 (L) 12/01/2019   MCV 92.6 12/01/2019   PLT 80 (L) 12/01/2019   NEUTROABS 1.6 (L) 12/01/2019    CT Abdomen Pelvis W Contrast  Result Date: 11/28/2019 CLINICAL DATA:  86 year old female with acute abdominal pain and fever. EXAM: CT ABDOMEN AND PELVIS WITH CONTRAST TECHNIQUE: Multidetector CT imaging of the abdomen and pelvis was performed using the standard protocol following bolus administration of intravenous contrast. CONTRAST:  135m OMNIPAQUE IOHEXOL 300 MG/ML  SOLN COMPARISON:  None. FINDINGS: Bilateral hip replacement artifact obscures detail within the pelvis. Lower chest: Cardiomegaly and subsegmental atelectasis/scarring in the lung bases identified. Hepatobiliary: Scattered hepatic cysts are present. Mild periportal edema is identified. No other hepatic abnormalities are present. Gallbladder is unremarkable.  No biliary dilatation. Pancreas: Unremarkable Spleen: Unremarkable Adrenals/Urinary Tract: The kidneys, adrenal glands and bladder are unremarkable. Stomach/Bowel: Stomach is within normal limits. Appendix appears normal. No evidence of bowel wall thickening, distention, or inflammatory changes. Vascular/Lymphatic: Aortic atherosclerosis. No enlarged abdominal or pelvic lymph nodes.  Reproductive: No definite abnormality but hip arthroplasty artifact obscures detail. Other: A small amount of free pelvic fluid is nonspecific. There is no evidence of pneumoperitoneum or focal collection. Musculoskeletal: No acute or suspicious bony abnormalities  are identified. Bilateral hip arthroplasties are noted. IMPRESSION: 1. Small amount of free pelvic fluid, nonspecific. 2. Cardiomegaly and subsegmental atelectasis/scarring in the lung bases. 3. Aortic Atherosclerosis (ICD10-I70.0). Electronically Signed   By: Margarette Canada M.D.   On: 11/28/2019 11:19   DG Chest Port 1 View  Result Date: 11/28/2019 CLINICAL DATA:  Fevers and weakness EXAM: PORTABLE CHEST 1 VIEW COMPARISON:  03/10/2014 FINDINGS: Cardiac shadow is stable. Aortic calcifications are again noted. Lungs are clear bilaterally. No focal infiltrate or sizable effusion is noted. Nipple shadow is noted over right base. No new focal abnormality is seen. IMPRESSION: No acute abnormality noted. Electronically Signed   By: Inez Catalina M.D.   On: 11/28/2019 10:23    ASSESSMENT AND PLAN: Mrs. Estock is an 86 year old female with medical history remarkable for breast cancer status post surgical resection (with no adjuvant treatment) and atrial fibrillation who presents for evaluation of fever and was found to have neutropenia and thrombocytopenia.  The patient reports that she had temperatures at home as high as 102 F and that her symptoms began on Sunday.  She notes that she has had no other focal infectious symptoms such as cough, shortness of breath, runny nose, sore throat, or abdominal discomfort.  She has had some episodes of diarrhea in the interim.    She has remained afebrile since admission.  Her WBC has now normalized and platelet count is up to 80,000.  Neutropenia and thrombocytopenia less likely due to underlying bone marrow issue more likely due to acute infection and possible rickettsial disease given that she has responded well to antibiotics.  Bone marrow biopsy has been canceled.  #Thrombocytopenia #Neutropenia --broad differential including sepsis, rickettsial disease, nutritional deficiency, or possible hematological malignancy --Vitamin B12 and folate normal.  Copper  pending. --infectious workup in process.  Hepatitis B and C serologies nonreactive.  Additional studies including evaluate for her rickettsial disease currently pending. --She has had no recurrent fevers, WBC has normalized, and platelet count continues to improve and is up to 80,000 today --Bone marrow biopsy canceled and I have communicated this to interventional radiology --From our standpoint, the patient may be discharged home when otherwise medically stable.  Recommend for her to continue doxycycline for empiric treatment of possible rickettsial disease. --We will plan for outpatient follow-up at the cancer center this Friday to recheck her labs.  I have sent a scheduling message.   LOS: 3 days   Mikey Bussing, DNP, AGPCNP-BC, AOCNP 12/01/19  I have read the above note and agree with the assessment and plan. Briefly, the patient's blood counts have improved steadily with only supportive care and antibiotic therapy. As such a primary hematological process is considerably less likely and we have decided to hold off on a bone marrow biopsy. From our perspective the patient is clear for d/c on doxycycline therapy alone. We will have her f/u in hematology clinic to assure her counts return to baseline.  Ledell Peoples, MD Department of Hematology/Oncology [place] [facility] Phone: [phone] Pager: [phone] Email: [email or web address]

## 2019-12-02 ENCOUNTER — Encounter: Payer: Self-pay | Admitting: Family Medicine

## 2019-12-02 ENCOUNTER — Telehealth: Payer: Self-pay | Admitting: Family Medicine

## 2019-12-02 DIAGNOSIS — I480 Paroxysmal atrial fibrillation: Secondary | ICD-10-CM | POA: Diagnosis not present

## 2019-12-02 DIAGNOSIS — M19041 Primary osteoarthritis, right hand: Secondary | ICD-10-CM | POA: Diagnosis not present

## 2019-12-02 DIAGNOSIS — M8580 Other specified disorders of bone density and structure, unspecified site: Secondary | ICD-10-CM | POA: Diagnosis not present

## 2019-12-02 DIAGNOSIS — E871 Hypo-osmolality and hyponatremia: Secondary | ICD-10-CM | POA: Diagnosis not present

## 2019-12-02 DIAGNOSIS — E876 Hypokalemia: Secondary | ICD-10-CM | POA: Diagnosis not present

## 2019-12-02 DIAGNOSIS — D696 Thrombocytopenia, unspecified: Secondary | ICD-10-CM | POA: Diagnosis not present

## 2019-12-02 DIAGNOSIS — I119 Hypertensive heart disease without heart failure: Secondary | ICD-10-CM | POA: Diagnosis not present

## 2019-12-02 DIAGNOSIS — I700 Atherosclerosis of aorta: Secondary | ICD-10-CM | POA: Diagnosis not present

## 2019-12-02 DIAGNOSIS — E039 Hypothyroidism, unspecified: Secondary | ICD-10-CM | POA: Diagnosis not present

## 2019-12-02 LAB — LEGIONELLA PNEUMOPHILA SEROGP 1 UR AG: L. pneumophila Serogp 1 Ur Ag: NEGATIVE

## 2019-12-02 LAB — ROCKY MTN SPOTTED FVR ABS PNL(IGG+IGM)
RMSF IgG: POSITIVE — AB
RMSF IgM: 0.68 index (ref 0.00–0.89)

## 2019-12-02 LAB — COPPER, SERUM: Copper: 116 ug/dL (ref 80–158)

## 2019-12-02 LAB — RMSF, IGG, IFA: RMSF, IGG, IFA: 1:256 {titer} — ABNORMAL HIGH

## 2019-12-02 NOTE — Telephone Encounter (Signed)
°  Carita Pian  [phone] With [facility] called and wants orders to continue  PT Once a week x 1 week Twice a week x 2 weeks Once a week x 3 weeks      Also needs rx for shower chair

## 2019-12-02 NOTE — Telephone Encounter (Signed)
ok 

## 2019-12-03 ENCOUNTER — Telehealth: Payer: Self-pay | Admitting: Hematology and Oncology

## 2019-12-03 LAB — EHRLICHIA ANTIBODY PANEL
E chaffeensis (HGE) Ab, IgG: NEGATIVE
E chaffeensis (HGE) Ab, IgM: NEGATIVE
E. Chaffeensis (HME) IgM Titer: NEGATIVE
E.Chaffeensis (HME) IgG: NEGATIVE

## 2019-12-03 NOTE — Telephone Encounter (Signed)
Ms. Bordner has been cld and confirmed appt w/Dr. Lorenso Courier on 7/30 at 230pm w/labs at 2pm. Pt aware to arrive 15 minutes early.

## 2019-12-03 NOTE — Telephone Encounter (Signed)
Carita Pian  was advised. Crystal

## 2019-12-04 LAB — CULTURE, BLOOD (ROUTINE X 2)
Culture: NO GROWTH
Culture: NO GROWTH
Special Requests: ADEQUATE
Special Requests: ADEQUATE

## 2019-12-05 ENCOUNTER — Other Ambulatory Visit: Payer: Self-pay | Admitting: Hematology and Oncology

## 2019-12-05 ENCOUNTER — Ambulatory Visit: Payer: Medicare PPO | Admitting: Hematology and Oncology

## 2019-12-05 ENCOUNTER — Other Ambulatory Visit: Payer: Medicare PPO

## 2019-12-05 ENCOUNTER — Inpatient Hospital Stay: Payer: Medicare PPO | Attending: Hematology and Oncology

## 2019-12-05 ENCOUNTER — Inpatient Hospital Stay: Payer: Medicare PPO | Admitting: Hematology and Oncology

## 2019-12-05 ENCOUNTER — Other Ambulatory Visit: Payer: Self-pay

## 2019-12-05 VITALS — BP 147/73 | HR 66 | Temp 97.5°F | Resp 18 | Ht 65.0 in

## 2019-12-05 DIAGNOSIS — Z9012 Acquired absence of left breast and nipple: Secondary | ICD-10-CM | POA: Insufficient documentation

## 2019-12-05 DIAGNOSIS — D61818 Other pancytopenia: Secondary | ICD-10-CM

## 2019-12-05 DIAGNOSIS — Z8261 Family history of arthritis: Secondary | ICD-10-CM | POA: Diagnosis not present

## 2019-12-05 DIAGNOSIS — D703 Neutropenia due to infection: Secondary | ICD-10-CM | POA: Diagnosis not present

## 2019-12-05 DIAGNOSIS — Z853 Personal history of malignant neoplasm of breast: Secondary | ICD-10-CM | POA: Diagnosis not present

## 2019-12-05 DIAGNOSIS — D696 Thrombocytopenia, unspecified: Secondary | ICD-10-CM

## 2019-12-05 DIAGNOSIS — Z8673 Personal history of transient ischemic attack (TIA), and cerebral infarction without residual deficits: Secondary | ICD-10-CM | POA: Diagnosis not present

## 2019-12-05 DIAGNOSIS — A770 Spotted fever due to Rickettsia rickettsii: Secondary | ICD-10-CM | POA: Diagnosis not present

## 2019-12-05 DIAGNOSIS — K7689 Other specified diseases of liver: Secondary | ICD-10-CM | POA: Diagnosis not present

## 2019-12-05 DIAGNOSIS — I10 Essential (primary) hypertension: Secondary | ICD-10-CM | POA: Diagnosis not present

## 2019-12-05 DIAGNOSIS — Z8379 Family history of other diseases of the digestive system: Secondary | ICD-10-CM | POA: Diagnosis not present

## 2019-12-05 DIAGNOSIS — E039 Hypothyroidism, unspecified: Secondary | ICD-10-CM | POA: Insufficient documentation

## 2019-12-05 DIAGNOSIS — Z85820 Personal history of malignant melanoma of skin: Secondary | ICD-10-CM | POA: Insufficient documentation

## 2019-12-05 DIAGNOSIS — Z807 Family history of other malignant neoplasms of lymphoid, hematopoietic and related tissues: Secondary | ICD-10-CM | POA: Insufficient documentation

## 2019-12-05 DIAGNOSIS — Z809 Family history of malignant neoplasm, unspecified: Secondary | ICD-10-CM | POA: Diagnosis not present

## 2019-12-05 DIAGNOSIS — Z7901 Long term (current) use of anticoagulants: Secondary | ICD-10-CM | POA: Insufficient documentation

## 2019-12-05 DIAGNOSIS — I700 Atherosclerosis of aorta: Secondary | ICD-10-CM | POA: Diagnosis not present

## 2019-12-05 DIAGNOSIS — Z79899 Other long term (current) drug therapy: Secondary | ICD-10-CM | POA: Insufficient documentation

## 2019-12-05 DIAGNOSIS — Z90722 Acquired absence of ovaries, bilateral: Secondary | ICD-10-CM | POA: Insufficient documentation

## 2019-12-05 DIAGNOSIS — D72819 Decreased white blood cell count, unspecified: Secondary | ICD-10-CM | POA: Diagnosis not present

## 2019-12-05 DIAGNOSIS — R531 Weakness: Secondary | ICD-10-CM | POA: Diagnosis not present

## 2019-12-05 DIAGNOSIS — M8580 Other specified disorders of bone density and structure, unspecified site: Secondary | ICD-10-CM | POA: Diagnosis not present

## 2019-12-05 DIAGNOSIS — Z87891 Personal history of nicotine dependence: Secondary | ICD-10-CM | POA: Diagnosis not present

## 2019-12-05 DIAGNOSIS — R109 Unspecified abdominal pain: Secondary | ICD-10-CM | POA: Insufficient documentation

## 2019-12-05 DIAGNOSIS — Z8249 Family history of ischemic heart disease and other diseases of the circulatory system: Secondary | ICD-10-CM | POA: Insufficient documentation

## 2019-12-05 DIAGNOSIS — R509 Fever, unspecified: Secondary | ICD-10-CM | POA: Diagnosis not present

## 2019-12-05 DIAGNOSIS — S80869A Insect bite (nonvenomous), unspecified lower leg, initial encounter: Secondary | ICD-10-CM | POA: Diagnosis not present

## 2019-12-05 LAB — CMP (CANCER CENTER ONLY)
ALT: 41 U/L (ref 0–44)
AST: 32 U/L (ref 15–41)
Albumin: 3.7 g/dL (ref 3.5–5.0)
Alkaline Phosphatase: 65 U/L (ref 38–126)
Anion gap: 10 (ref 5–15)
BUN: 11 mg/dL (ref 8–23)
CO2: 26 mmol/L (ref 22–32)
Calcium: 9.9 mg/dL (ref 8.9–10.3)
Chloride: 100 mmol/L (ref 98–111)
Creatinine: 0.75 mg/dL (ref 0.44–1.00)
GFR, Est AFR Am: >60 mL/min (ref >60)
GFR, Estimated: >60 mL/min (ref >60)
Glucose, Bld: 114 mg/dL — ABNORMAL HIGH (ref 70–99)
Potassium: 4.4 mmol/L (ref 3.5–5.1)
Sodium: 136 mmol/L (ref 135–145)
Total Bilirubin: 0.6 mg/dL (ref 0.3–1.2)
Total Protein: 7.1 g/dL (ref 6.5–8.1)

## 2019-12-05 LAB — CBC WITH DIFFERENTIAL (CANCER CENTER ONLY)
Abs Immature Granulocytes: 0.07 10*3/uL (ref 0.00–0.07)
Basophils Absolute: 0.1 10*3/uL (ref 0.0–0.1)
Basophils Relative: 1 %
Eosinophils Absolute: 0 10*3/uL (ref 0.0–0.5)
Eosinophils Relative: 0 %
HCT: 35.6 % — ABNORMAL LOW (ref 36.0–46.0)
Hemoglobin: 11.7 g/dL — ABNORMAL LOW (ref 12.0–15.0)
Immature Granulocytes: 1 %
Lymphocytes Relative: 45 %
Lymphs Abs: 4.8 10*3/uL — ABNORMAL HIGH (ref 0.7–4.0)
MCH: 30.1 pg (ref 26.0–34.0)
MCHC: 32.9 g/dL (ref 30.0–36.0)
MCV: 91.5 fL (ref 80.0–100.0)
Monocytes Absolute: 1.7 10*3/uL — ABNORMAL HIGH (ref 0.1–1.0)
Monocytes Relative: 16 %
Neutro Abs: 3.9 10*3/uL (ref 1.7–7.7)
Neutrophils Relative %: 37 %
Platelet Count: 279 10*3/uL (ref 150–400)
RBC: 3.89 MIL/uL (ref 3.87–5.11)
RDW: 15.1 % (ref 11.5–15.5)
WBC Count: 10.5 10*3/uL (ref 4.0–10.5)
nRBC: 0 % (ref 0.0–0.2)

## 2019-12-05 LAB — RETIC PANEL
Immature Retic Fract: 15.6 % (ref 2.3–15.9)
RBC.: 3.82 MIL/uL — ABNORMAL LOW (ref 3.87–5.11)
Retic Count, Absolute: 56.2 10*3/uL (ref 19.0–186.0)
Retic Ct Pct: 1.5 % (ref 0.4–3.1)
Reticulocyte Hemoglobin: 34 pg (ref >27.9)

## 2019-12-05 LAB — SAVE SMEAR(SSMR), FOR PROVIDER SLIDE REVIEW

## 2019-12-07 ENCOUNTER — Encounter: Payer: Self-pay | Admitting: Hematology and Oncology

## 2019-12-07 NOTE — Progress Notes (Signed)
[place] [phone]   [phone]  PROGRESS NOTE  Patient Care Team: Girtha Rm, NP-C as PCP - General (Family Medicine)  Hematological/Oncological History # Leukopenia/Thrombocytopenia 1) 11/27/2019: WBC 1.8, Hgb 11.0, Plt 41, MCV 92.8. Started on cefepime/doxycycline 2) 12/01/2019: WBC 5.1, Hgb 11.3, Plt 80, MCV 92.6. Trending upward, d/c from hospital. RMSF titer returned IgG 1:256 (postiive). Continued doxycycline 3) 12/05/2019: f/u visit. WBC 10.5, Hgb 11.7, MCV 91.5, Plt 279  Interval History:  [name] 86 y.o. female with medical history significant for thrombocytopenia/leukopenia presents for a hospital follow up visit. The patient was last seen on 12/01/2019 at time of d/c from the hospital. In the interim since the last visit the patient's counts have rebounded to normal levels.  On exam today [name] is accompanied by her daughter.  She notes that she is feeling much better today has had no issues with fevers, chills, sweats, nausea, vomiting or diarrhea.  She notes that her energy is pretty good and that she is not having any new symptoms after having been discharged from the hospital.  On further discussion she notes that she had been bitten by an insect in her garden approximately 1 month ago.  She notes that it was on her right thigh and there was a small black eschar which developed at the site.  She notes that it was itchy, but did not cause any further issues.  She did have some GI upset during that time and some trouble sleeping, but is unclear if it was related to the bug bite.  The symptoms have subsequently resolved.  Today she has no other questions, concerns, or complaints.  A full 10 point ROS is listed below.  MEDICAL HISTORY:  Past Medical History:  Diagnosis Date  . Anemia    hx of with surgery   . Arthritis 07-27-11   osteoarthritis-hips/s/p bil. THA, arthritis right hand   . Blepharitis of left eye   . Blood  transfusion feb 2014   post surgery some time ago  . Cancer ([place]) 1989   Breast cancer -s/p lt. mastectomy, some squamous cell lesions  . Cold hands   . Complication of anesthesia    epinephrine - screams uncontrollably / pt does not want general anesthesia or narcotics  . Constipation   . Fuchs' corneal dystrophy    both eyes  . Hard of hearing    both ears mild loss  . Hyperkalemia 03/07/2018  . Hypertension   . Hypothyroidism 3-21- 13   tx. levothyroxine  . Lichen sclerosus et atrophicus of the vulva    pt reported.   . Osteopenia   . Stroke [facility])    a. s/p MDT LINQ 04/2014    SURGICAL HISTORY: Past Surgical History:  Procedure Laterality Date  . ABDOMINAL HYSTERECTOMY     ovaries removed  . ANKLE SURGERY  2005   right ankle tendon repair  . basal cell removed from face     3-4 times  . BREAST SURGERY  07-1987   lt.breast cancer intraductal cancer, mastectomy  . CATARACT EXTRACTION, BILATERAL  few yrs ago   Bilateral  . HARDWARE REMOVAL  04/05/2012   Procedure: HARDWARE REMOVAL;  Surgeon: Gearlean Alf, MD;  Location: [facility];  Service: Orthopedics;  Laterality: Right;  Removal of Hardware Right Femur   . HARDWARE REMOVAL Left 06/30/2013   Procedure: LEFT HIP HARDWARE REMOVAL OF CABLES;  Surgeon: Gearlean Alf, MD;  Location: [facility];  Service: Orthopedics;  Laterality: Left;  . HARDWARE REMOVAL Right 08/21/2013   Procedure: RIGHT HIP HARDWARE REMOVAL;  Surgeon: Gearlean Alf, MD;  Location: [facility];  Service: Orthopedics;  Laterality: Right;  . JOINT REPLACEMENT  07-27-11   Bilateral: Right THA - 04/2000, Left THA 06/2001  . left hip replacement Left feb 2003  . LOOP RECORDER IMPLANT  04-08-2014   MDT LINQ implanted by Dr Lovena Le for cryptogenic stroke  . LOOP RECORDER REMOVAL N/A 04/26/2017   Procedure: LOOP RECORDER REMOVAL;  Surgeon: Evans Lance, MD;  Location: [facility];  Service: Cardiovascular;  Laterality: N/A;  . MASTECTOMY  1989   left - Pt  states has no restrictions on use of L arm for BP or blood draw  . melanoma removed Left    x 1  . ORIF FEMUR FRACTURE  07/31/2011   Procedure: OPEN REDUCTION INTERNAL FIXATION (ORIF) DISTAL FEMUR FRACTURE;  Surgeon: Gearlean Alf, MD;  Location: [facility];  Service: Orthopedics;  Laterality: Right;  right femur  (c-arm)   . SQUAMOUS CELL CARCINOMA EXCISION  07-27-11   x2 left shoulder  . TOTAL HIP REVISION  04/10/2012   Procedure: TOTAL HIP REVISION;  Surgeon: Gearlean Alf, MD;  Location: [facility];  Service: Orthopedics;  Laterality: Right;  . TOTAL HIP REVISION Left 06/27/2012   Procedure: LEFT TOTAL HIP REVISION;  Surgeon: Gearlean Alf, MD;  Location: [facility];  Service: Orthopedics;  Laterality: Left;    SOCIAL HISTORY: Social History   Socioeconomic History  . Marital status: Single    Spouse name: Not on file  . Number of children: 1  . Years of education: Not on file  . Highest education level: Not on file  Occupational History  . Occupation: Retired  Tobacco Use  . Smoking status: Former Smoker    Packs/day: 1.00    Years: 10.00    Pack years: 10.00    Types: Cigarettes    Quit date: 05/09/1963    Years since quitting: 56.6  . Smokeless tobacco: Never Used  Vaping Use  . Vaping Use: Never used  Substance and Sexual Activity  . Alcohol use: Yes    Alcohol/week: 1.0 standard drink    Types: 1 Glasses of wine per week    Comment: very little  . Drug use: No  . Sexual activity: Never  Other Topics Concern  . Not on file  Social History Narrative   Lives alone.   Social Determinants of Health   Financial Resource Strain:   . Difficulty of Paying Living Expenses:   Food Insecurity:   . Worried About Charity fundraiser in the Last Year:   . Arboriculturist in the Last Year:   Transportation Needs:   . Film/video editor (Medical):   Marland Kitchen Lack of Transportation (Non-Medical):   Physical Activity:   . Days of Exercise per Week:   . Minutes of Exercise per  Session:   Stress:   . Feeling of Stress :   Social Connections:   . Frequency of Communication with Friends and Family:   . Frequency of Social Gatherings with Friends and Family:   . Attends Religious Services:   . Active Member of Clubs or Organizations:   . Attends Archivist Meetings:   Marland Kitchen Marital Status:   Intimate Partner Violence:   . Fear of Current or Ex-Partner:   . Emotionally Abused:   Marland Kitchen Physically Abused:   .  Sexually Abused:     FAMILY HISTORY: Family History  Problem Relation Age of Onset  . Hodgkin's lymphoma Mother   . Celiac disease Mother   . Cancer Mother   . Melanoma Father   . Cancer Father   . Cancer Brother   . Arthritis Sister   . Ulcerative colitis Sister   . Heart disease Paternal Grandmother   . Heart disease Paternal Grandfather     ALLERGIES:  is allergic to lactose intolerance (gi) and epinephrine.  MEDICATIONS:  Current Outpatient Medications  Medication Sig Dispense Refill  . amLODipine (NORVASC) 5 MG tablet TAKE 1 TABLET(5 MG) BY MOUTH EVERY MORNING (Patient taking differently: Take 5 mg by mouth daily. TAKE 1 TABLET(5 MG) BY MOUTH EVERY MORNING) 90 tablet 1  . Coenzyme Q10 (COQ-10 PO) Take 1 tablet by mouth daily.    Marland Kitchen docusate sodium (COLACE) 100 MG capsule Take 100 mg by mouth daily.    Marland Kitchen doxycycline (VIBRA-TABS) 100 MG tablet Take 1 tablet (100 mg total) by mouth every 12 (twelve) hours. 14 tablet 0  . ELIQUIS 2.5 MG TABS tablet TAKE 1 TABLET TWICE DAILY (Patient taking differently: Take 2.5 mg by mouth 2 (two) times daily. ) 180 tablet 0  . levothyroxine (SYNTHROID) 100 MCG tablet Take 1 tablet (100 mcg total) by mouth daily. 90 tablet 0  . Multiple Vitamins-Iron (MULTIVITAMINS WITH IRON) TABS tablet Take 1 tablet by mouth daily.     . Omega-3 Fatty Acids (FISH OIL PO) Take 2 capsules by mouth.     . Probiotic Product (PROBIOTIC PO) Take 1 capsule by mouth daily.    Marland Kitchen Propylene Glycol (SYSTANE BALANCE OP) Apply 1 drop to  eye in the morning and at bedtime.     . Vitamin D, Cholecalciferol, 1000 UNITS TABS Take 1,000 Units by mouth daily.      No current facility-administered medications for this visit.    REVIEW OF SYSTEMS:   Constitutional: ( - ) fevers, ( - )  chills , ( - ) night sweats Eyes: ( - ) blurriness of vision, ( - ) double vision, ( - ) watery eyes Ears, nose, mouth, throat, and face: ( - ) mucositis, ( - ) sore throat Respiratory: ( - ) cough, ( - ) dyspnea, ( - ) wheezes Cardiovascular: ( - ) palpitation, ( - ) chest discomfort, ( - ) lower extremity swelling Gastrointestinal:  ( - ) nausea, ( - ) heartburn, ( - ) change in bowel habits Skin: ( - ) abnormal skin rashes Lymphatics: ( - ) new lymphadenopathy, ( - ) easy bruising Neurological: ( - ) numbness, ( - ) tingling, ( - ) new weaknesses Behavioral/Psych: ( - ) mood change, ( - ) new changes  All other systems were reviewed with the patient and are negative.  PHYSICAL EXAMINATION:  Vitals:   12/05/19 1431  BP: (!) 147/73  Pulse: 66  Resp: 18  Temp: (!) 97.5 F (36.4 C)  SpO2: 99%   There were no vitals filed for this visit.  GENERAL: well appearing elderly female, alert, no distress and comfortable SKIN: skin color, texture, turgor are normal, no rashes or significant lesions EYES: conjunctiva are pink and non-injected, sclera clear LUNGS: clear to auscultation and percussion with normal breathing effort HEART: regular rate & rhythm and no murmurs and no lower extremity edema ABDOMEN: soft, non-tender, non-distended, normal bowel sounds.  No HSM.  Musculoskeletal: no cyanosis of digits and no clubbing  PSYCH: alert &  oriented x 3, fluent speech NEURO: no focal motor/sensory deficits  LABORATORY DATA:  I have reviewed the data as listed CBC Latest Ref Rng & Units 12/05/2019 12/01/2019 11/30/2019  WBC 4.0 - 10.5 K/uL 10.5 5.1 4.5  Hemoglobin 12.0 - 15.0 g/dL 11.7(L) 11.3(L) 10.6(L)  Hematocrit 36 - 46 % 35.6(L) 33.7(L)  31.1(L)  Platelets 150 - 400 K/uL 279 80(L) 54(L)    CMP Latest Ref Rng & Units 12/05/2019 12/01/2019 11/30/2019  Glucose 70 - 99 mg/dL 114(H) 107(H) 89  BUN 8 - 23 mg/dL 11 10 11   Creatinine 0.44 - 1.00 mg/dL 0.75 0.49 0.58  Sodium 135 - 145 mmol/L 136 132(L) 130(L)  Potassium 3.5 - 5.1 mmol/L 4.4 3.9 4.0  Chloride 98 - 111 mmol/L 100 100 99  CO2 22 - 32 mmol/L 26 24 23   Calcium 8.9 - 10.3 mg/dL 9.9 8.2(L) 7.9(L)  Total Protein 6.5 - 8.1 g/dL 7.1 - -  Total Bilirubin 0.3 - 1.2 mg/dL 0.6 - -  Alkaline Phos 38 - 126 U/L 65 - -  AST 15 - 41 U/L 32 - -  ALT 0 - 44 U/L 41 - -    RADIOGRAPHIC STUDIES: CT Abdomen Pelvis W Contrast  Result Date: 11/28/2019 CLINICAL DATA:  86 year old female with acute abdominal pain and fever. EXAM: CT ABDOMEN AND PELVIS WITH CONTRAST TECHNIQUE: Multidetector CT imaging of the abdomen and pelvis was performed using the standard protocol following bolus administration of intravenous contrast. CONTRAST:  136mL OMNIPAQUE IOHEXOL 300 MG/ML  SOLN COMPARISON:  None. FINDINGS: Bilateral hip replacement artifact obscures detail within the pelvis. Lower chest: Cardiomegaly and subsegmental atelectasis/scarring in the lung bases identified. Hepatobiliary: Scattered hepatic cysts are present. Mild periportal edema is identified. No other hepatic abnormalities are present. Gallbladder is unremarkable.  No biliary dilatation. Pancreas: Unremarkable Spleen: Unremarkable Adrenals/Urinary Tract: The kidneys, adrenal glands and bladder are unremarkable. Stomach/Bowel: Stomach is within normal limits. Appendix appears normal. No evidence of bowel wall thickening, distention, or inflammatory changes. Vascular/Lymphatic: Aortic atherosclerosis. No enlarged abdominal or pelvic lymph nodes. Reproductive: No definite abnormality but hip arthroplasty artifact obscures detail. Other: A small amount of free pelvic fluid is nonspecific. There is no evidence of pneumoperitoneum or focal  collection. Musculoskeletal: No acute or suspicious bony abnormalities are identified. Bilateral hip arthroplasties are noted. IMPRESSION: 1. Small amount of free pelvic fluid, nonspecific. 2. Cardiomegaly and subsegmental atelectasis/scarring in the lung bases. 3. Aortic Atherosclerosis (ICD10-I70.0). Electronically Signed   By: Margarette Canada M.D.   On: 11/28/2019 11:19   DG Chest Port 1 View  Result Date: 11/28/2019 CLINICAL DATA:  Fevers and weakness EXAM: PORTABLE CHEST 1 VIEW COMPARISON:  03/10/2014 FINDINGS: Cardiac shadow is stable. Aortic calcifications are again noted. Lungs are clear bilaterally. No focal infiltrate or sizable effusion is noted. Nipple shadow is noted over right base. No new focal abnormality is seen. IMPRESSION: No acute abnormality noted. Electronically Signed   By: Inez Catalina M.D.   On: 11/28/2019 10:23    ASSESSMENT & PLAN [name] 86 y.o. female with medical history significant for thrombocytopenia/leukopenia presents for a hospital follow up visit.  After review the labs, discussion with the patient, and review the records the patient's findings are most consistent with a leukopenia and thrombocytopenia secondary to a rickettsial infection.  It is possible there was not a bacterial infection of all which was adequately treated with the cefepime therapy.  However given the robust response and positive [facility] spotted fever titers I do  believe that this is the most likely etiology.  Treatment for this condition is typically a 7-day course of doxycycline, which the patient has completed.  She also notes that earlier this month she did have a bug bite on her leg which may have been the initial inciting event.  I would recommend strong return precautions for this patient, however I do not think there is a need for routine follow-up given her robust response and return to normal platelet and white blood cell count levels.  # Leukopenia/Thrombocytopenia,  resolved --counts have rebounded robustly to normal levels. --strong suspicions this is 2/2 to RMSF given the positive titers and apparent response to doxycycline --complete full course of doxycycline as prescribed.   --no need for routine f/u in our clinic. Strict return precautions for new onset symptoms such as fatigue, fever, rash, bleeding.   No orders of the defined types were placed in this encounter.   All questions were answered. The patient knows to call the clinic with any problems, questions or concerns.  A total of more than 30 minutes were spent on this encounter and over half of that time was spent on counseling and coordination of care as outlined above.   Ledell Peoples, MD Department of Hematology/Oncology [place] [facility] Phone: [phone] Pager: [phone] Email: [email or web address]  12/07/2019 2:31 PM

## 2019-12-08 DIAGNOSIS — E876 Hypokalemia: Secondary | ICD-10-CM | POA: Diagnosis not present

## 2019-12-08 DIAGNOSIS — M8580 Other specified disorders of bone density and structure, unspecified site: Secondary | ICD-10-CM | POA: Diagnosis not present

## 2019-12-08 DIAGNOSIS — I119 Hypertensive heart disease without heart failure: Secondary | ICD-10-CM | POA: Diagnosis not present

## 2019-12-08 DIAGNOSIS — I480 Paroxysmal atrial fibrillation: Secondary | ICD-10-CM | POA: Diagnosis not present

## 2019-12-08 DIAGNOSIS — M19041 Primary osteoarthritis, right hand: Secondary | ICD-10-CM | POA: Diagnosis not present

## 2019-12-08 DIAGNOSIS — D696 Thrombocytopenia, unspecified: Secondary | ICD-10-CM | POA: Diagnosis not present

## 2019-12-08 DIAGNOSIS — E039 Hypothyroidism, unspecified: Secondary | ICD-10-CM | POA: Diagnosis not present

## 2019-12-08 DIAGNOSIS — E871 Hypo-osmolality and hyponatremia: Secondary | ICD-10-CM | POA: Diagnosis not present

## 2019-12-08 DIAGNOSIS — I700 Atherosclerosis of aorta: Secondary | ICD-10-CM | POA: Diagnosis not present

## 2019-12-10 DIAGNOSIS — I119 Hypertensive heart disease without heart failure: Secondary | ICD-10-CM | POA: Diagnosis not present

## 2019-12-10 DIAGNOSIS — I700 Atherosclerosis of aorta: Secondary | ICD-10-CM | POA: Diagnosis not present

## 2019-12-10 DIAGNOSIS — M19041 Primary osteoarthritis, right hand: Secondary | ICD-10-CM | POA: Diagnosis not present

## 2019-12-10 DIAGNOSIS — M8580 Other specified disorders of bone density and structure, unspecified site: Secondary | ICD-10-CM | POA: Diagnosis not present

## 2019-12-10 DIAGNOSIS — I480 Paroxysmal atrial fibrillation: Secondary | ICD-10-CM | POA: Diagnosis not present

## 2019-12-10 DIAGNOSIS — E039 Hypothyroidism, unspecified: Secondary | ICD-10-CM | POA: Diagnosis not present

## 2019-12-10 DIAGNOSIS — D696 Thrombocytopenia, unspecified: Secondary | ICD-10-CM | POA: Diagnosis not present

## 2019-12-10 DIAGNOSIS — E876 Hypokalemia: Secondary | ICD-10-CM | POA: Diagnosis not present

## 2019-12-10 DIAGNOSIS — E871 Hypo-osmolality and hyponatremia: Secondary | ICD-10-CM | POA: Diagnosis not present

## 2019-12-12 ENCOUNTER — Inpatient Hospital Stay: Payer: Medicare PPO | Admitting: Family Medicine

## 2019-12-15 ENCOUNTER — Ambulatory Visit: Payer: Medicare PPO | Admitting: Family Medicine

## 2019-12-15 ENCOUNTER — Other Ambulatory Visit: Payer: Self-pay

## 2019-12-15 VITALS — BP 130/80 | HR 51 | Temp 97.9°F | Resp 16 | Wt 121.4 lb

## 2019-12-15 DIAGNOSIS — D696 Thrombocytopenia, unspecified: Secondary | ICD-10-CM

## 2019-12-15 DIAGNOSIS — Z09 Encounter for follow-up examination after completed treatment for conditions other than malignant neoplasm: Secondary | ICD-10-CM

## 2019-12-15 DIAGNOSIS — I10 Essential (primary) hypertension: Secondary | ICD-10-CM | POA: Diagnosis not present

## 2019-12-15 DIAGNOSIS — E039 Hypothyroidism, unspecified: Secondary | ICD-10-CM

## 2019-12-15 DIAGNOSIS — I480 Paroxysmal atrial fibrillation: Secondary | ICD-10-CM | POA: Diagnosis not present

## 2019-12-15 NOTE — Patient Instructions (Signed)
Continue keeping a close eye on your blood pressure at home.   Let your cardiologist know if your readings are staying elevated.   I will be in touch with your lab results.

## 2019-12-15 NOTE — Progress Notes (Signed)
° °  Subjective:    Patient ID: [name], female    DOB: [birth year], 86 y.o.   MRN: [id number]  HPI Chief Complaint  Patient presents with   hospital    hospital follow-up, bp needs to be readjust, A-fib episode, when does she stop taking probiotic   Here for a hospital discharge follow up.   Completed antibiotics for RMSF. Doing well. Taking probiotics still.   States she had an episode of A-fib that lasted 2 hours and resolved spontaneously. She is taking Eliquis. No sign of bleeding.  Platelets were very low in hospital, dropped to 39. Resolved prior to discharge.   BP has been variable. States she is only taking amlodipine 5 mg now. HCTZ stopped in hospital due to low sodium. She has her BP monitor with her and her readings have been from 102-160s/60s-70s.  She does not have a follow up scheduled with her cardiologist, Dr. Caryl Comes.   States she is eating and sleeping ok and has started back with her usual hiking but taking it slow.   No new concerns today.   No recent thyroid function in chart. Needs rechecked.   Denies fever, chills, dizziness, chest pain, palpitations, shortness of breath, abdominal pain, N/V/D.    Review of Systems Pertinent positives and negatives in the history of present illness.     Objective:   Physical Exam BP 130/80    Pulse (!) 51    Temp 97.9 F (36.6 C)    Resp 16    Wt 121 lb 6.4 oz (55.1 kg)    SpO2 98%    BMI 20.20 kg/m   Alert and in no distress. Cardiac exam shows a regular sinus rhythm. Lungs are clear to auscultation. Extremities without edema. Normal speech, mood and thought process. Normal gait.       Assessment & Plan:  Hospital discharge follow-up - Plan: Comprehensive metabolic panel, CBC with Differential/Platelet -reviewed discharge summary, medication reconciliation and lab results. She appears to be doing quite well but has had a recent episode of A-fib. BP has been quite variable.  No new concerns today. Check labs and  follow up.   Paroxysmal atrial fibrillation (HCC) -recent episode lasting 2 hours. Regular rhythm today. Asymptomatic. Follow up with Dr. Caryl Comes.   Thrombocytopenia ([place]) - Plan: CBC with Differential/Platelet -resolved. Check CBC and follow up.   Essential hypertension - Plan: Comprehensive metabolic panel, CBC with Differential/Platelet -continue on amlodipine now. HCTZ was stopped due to low sodium. BP is variable. Recommend addressing HTN with Dr. Caryl Comes as well as starting back on HCTZ.   Hypothyroidism, unspecified type - Plan: TSH -check TSH and follow up. Continue current dose of levothyroxine.

## 2019-12-16 ENCOUNTER — Encounter: Payer: Self-pay | Admitting: Family Medicine

## 2019-12-16 LAB — COMPREHENSIVE METABOLIC PANEL
ALT: 25 IU/L (ref 0–32)
AST: 28 IU/L (ref 0–40)
Albumin/Globulin Ratio: 2 (ref 1.2–2.2)
Albumin: 4.5 g/dL (ref 3.6–4.6)
Alkaline Phosphatase: 69 IU/L (ref 48–121)
BUN/Creatinine Ratio: 25 (ref 12–28)
BUN: 16 mg/dL (ref 8–27)
Bilirubin Total: 0.3 mg/dL (ref 0.0–1.2)
CO2: 25 mmol/L (ref 20–29)
Calcium: 9.5 mg/dL (ref 8.7–10.3)
Chloride: 98 mmol/L (ref 96–106)
Creatinine, Ser: 0.64 mg/dL (ref 0.57–1.00)
GFR calc Af Amer: 93 mL/min/{1.73_m2} (ref >59)
GFR calc non Af Amer: 81 mL/min/{1.73_m2} (ref >59)
Globulin, Total: 2.2 g/dL (ref 1.5–4.5)
Glucose: 91 mg/dL (ref 65–99)
Potassium: 5 mmol/L (ref 3.5–5.2)
Sodium: 135 mmol/L (ref 134–144)
Total Protein: 6.7 g/dL (ref 6.0–8.5)

## 2019-12-16 LAB — CBC WITH DIFFERENTIAL/PLATELET
Basophils Absolute: 0.1 10*3/uL (ref 0.0–0.2)
Basos: 1 %
EOS (ABSOLUTE): 0.1 10*3/uL (ref 0.0–0.4)
Eos: 1 %
Hematocrit: 36 % (ref 34.0–46.6)
Hemoglobin: 11.4 g/dL (ref 11.1–15.9)
Immature Grans (Abs): 0 10*3/uL (ref 0.0–0.1)
Immature Granulocytes: 0 %
Lymphocytes Absolute: 3.5 10*3/uL — ABNORMAL HIGH (ref 0.7–3.1)
Lymphs: 53 %
MCH: 30.2 pg (ref 26.6–33.0)
MCHC: 31.7 g/dL (ref 31.5–35.7)
MCV: 96 fL (ref 79–97)
Monocytes Absolute: 0.9 10*3/uL (ref 0.1–0.9)
Monocytes: 14 %
Neutrophils Absolute: 2.1 10*3/uL (ref 1.4–7.0)
Neutrophils: 31 %
Platelets: 275 10*3/uL (ref 150–450)
RBC: 3.77 x10E6/uL (ref 3.77–5.28)
RDW: 14.7 % (ref 11.7–15.4)
WBC: 6.7 10*3/uL (ref 3.4–10.8)

## 2019-12-16 LAB — TSH: TSH: 8.86 u[IU]/mL — ABNORMAL HIGH (ref 0.450–4.500)

## 2019-12-16 NOTE — Progress Notes (Signed)
Her TSH was high and she is taking levothyroxine. Emphasize that she should take this on an empty stomach every morning with water and hold off eating or drinking coffee, juice or anything else for at least 45 minutes. She needs a lab visit for TSH and free T4 in 4 weeks please. Hypothyroidism and medication management are the diagnoses.

## 2019-12-17 ENCOUNTER — Other Ambulatory Visit: Payer: Self-pay | Admitting: Internal Medicine

## 2019-12-17 DIAGNOSIS — Z5181 Encounter for therapeutic drug level monitoring: Secondary | ICD-10-CM

## 2019-12-17 DIAGNOSIS — E039 Hypothyroidism, unspecified: Secondary | ICD-10-CM

## 2019-12-17 NOTE — Telephone Encounter (Signed)
Spoke with [name] and offered appointment for Thursday, 12/18/2019 with Tommye Standard, PA-C for post hospital follow up and recent AFIB episode.  [name] refused appointment and states she would "just like Dr Olin Pia opinion of her message."  [name] advised will forward message to Dr Caryl Comes for review and recommendation.  [name] verbalizes understanding and agrees with current plan.

## 2019-12-27 ENCOUNTER — Encounter: Payer: Self-pay | Admitting: Family Medicine

## 2019-12-29 MED ORDER — APIXABAN 2.5 MG PO TABS: 2.5000 mg | ORAL_TABLET | Freq: Two times a day (BID) | ORAL | 1 refills | Status: DC

## 2019-12-29 MED ORDER — AMLODIPINE BESYLATE 5 MG PO TABS: 5.0000 mg | ORAL_TABLET | Freq: Every day | ORAL | 1 refills | Status: DC

## 2019-12-29 MED ORDER — LEVOTHYROXINE SODIUM 100 MCG PO TABS: 100.0000 ug | ORAL_TABLET | Freq: Every day | ORAL | 1 refills | Status: DC

## 2019-12-30 DIAGNOSIS — H04123 Dry eye syndrome of bilateral lacrimal glands: Secondary | ICD-10-CM | POA: Diagnosis not present

## 2019-12-30 DIAGNOSIS — Z961 Presence of intraocular lens: Secondary | ICD-10-CM | POA: Diagnosis not present

## 2019-12-30 DIAGNOSIS — H26493 Other secondary cataract, bilateral: Secondary | ICD-10-CM | POA: Diagnosis not present

## 2019-12-30 DIAGNOSIS — H18513 Endothelial corneal dystrophy, bilateral: Secondary | ICD-10-CM | POA: Diagnosis not present

## 2019-12-30 DIAGNOSIS — H02831 Dermatochalasis of right upper eyelid: Secondary | ICD-10-CM | POA: Diagnosis not present

## 2019-12-30 DIAGNOSIS — H02834 Dermatochalasis of left upper eyelid: Secondary | ICD-10-CM | POA: Diagnosis not present

## 2019-12-30 DIAGNOSIS — H43812 Vitreous degeneration, left eye: Secondary | ICD-10-CM | POA: Diagnosis not present

## 2020-01-02 NOTE — Telephone Encounter (Signed)
Called to speak to patient  No answer   Will reply by Peter Minium

## 2020-01-13 ENCOUNTER — Other Ambulatory Visit: Payer: Medicare PPO

## 2020-01-13 DIAGNOSIS — Z5181 Encounter for therapeutic drug level monitoring: Secondary | ICD-10-CM

## 2020-01-13 DIAGNOSIS — E039 Hypothyroidism, unspecified: Secondary | ICD-10-CM

## 2020-01-14 ENCOUNTER — Encounter: Payer: Self-pay | Admitting: Family Medicine

## 2020-01-14 LAB — TSH: TSH: 6.27 u[IU]/mL — ABNORMAL HIGH (ref 0.450–4.500)

## 2020-01-14 LAB — T4, FREE: Free T4: 1.29 ng/dL (ref 0.82–1.77)

## 2020-01-19 ENCOUNTER — Encounter: Payer: Self-pay | Admitting: Family Medicine

## 2020-01-20 ENCOUNTER — Ambulatory Visit
Admission: RE | Admit: 2020-01-20 | Discharge: 2020-01-20 | Disposition: A | Discharge status: Home / Self Care | Payer: Medicare PPO | Source: Ambulatory Visit | Attending: Family Medicine | Admitting: Family Medicine

## 2020-01-20 ENCOUNTER — Encounter: Payer: Self-pay | Admitting: Family Medicine

## 2020-01-20 ENCOUNTER — Ambulatory Visit: Payer: Medicare PPO | Admitting: Family Medicine

## 2020-01-20 ENCOUNTER — Other Ambulatory Visit: Payer: Self-pay

## 2020-01-20 VITALS — BP 122/68 | HR 55 | Temp 98.1°F | Wt 122.4 lb

## 2020-01-20 DIAGNOSIS — S6991XA Unspecified injury of right wrist, hand and finger(s), initial encounter: Secondary | ICD-10-CM

## 2020-01-20 DIAGNOSIS — M21611 Bunion of right foot: Secondary | ICD-10-CM | POA: Diagnosis not present

## 2020-01-20 DIAGNOSIS — M79672 Pain in left foot: Secondary | ICD-10-CM

## 2020-01-20 DIAGNOSIS — W19XXXA Unspecified fall, initial encounter: Secondary | ICD-10-CM | POA: Diagnosis not present

## 2020-01-20 DIAGNOSIS — M21612 Bunion of left foot: Secondary | ICD-10-CM

## 2020-01-20 DIAGNOSIS — M25531 Pain in right wrist: Secondary | ICD-10-CM | POA: Diagnosis not present

## 2020-01-20 NOTE — Progress Notes (Signed)
   Subjective:    Patient ID: [name], female    DOB: [birth year], 86 y.o.   MRN: [id number]  HPI Chief Complaint  Patient presents with  . injuried right hand    cane she was using made her fall, face forward. bruised chin, right hand swollen- used ice, hard to put pressure on it or hurts to use hand to brush teeth   Here with complaints of improving right wrist pain, swelling and bruising following a fall 3 days ago. States she landed predominantly on her right wrist with her arm outstretched and she also scraped her chin.  Fall was due to getting her walking stick stuck on a curb and tripping. She denies hitting her head or having symptoms of concussion. No LOC. She is on Eliquis.  Denies neck or back pain.   States swelling and pain has improved. She is having minimal pain today. Is not needing pain medication.   No other arthralgias or myalgias.  No fever, chills, headache, dizziness, vision changes, chest pain, palpitations, shortness of breath, abdominal pain, nausea, vomiting.  She is also requesting a referral to podiatry since she is here.  Would like to get her bunions checked states they are getting larger and she is also having pain on the plantar aspect of her left foot along the metatarsals.  States she has seen podiatry in the distant past   Reviewed allergies, medications, past medical, surgical, family, and social history.    Review of Systems Pertinent positives and negatives in the history of present illness.     Objective:   Physical Exam BP 122/68   Pulse (!) 55   Temp 98.1 F (36.7 C)   Wt 122 lb 6.4 oz (55.5 kg)   BMI 20.37 kg/m   Alert and oriented and in no acute distress.  Dorsal aspect of right hand with bruising and mild edema that is spread beyond her right metacarpals. Normal sensation, cap refill, pulses and ROM of her right wrist but she does exhibit pain with extreme flexion and extension as well as adduction and abduction. Non tender over  her anatomic snuffbox. Normal strength and equal grip compared to left hand.       Assessment & Plan:  Injury of right wrist, initial encounter - Plan: DG Wrist Complete Right -No obvious deformity and she has good sensation and motor function of her right wrist and hand.  She reports significant improvement.  I will send her for an x-ray.  Discussed supportive care.  Fall, initial encounter -This was a mechanical fall and no red flag symptoms.   Bilateral bunions - Plan: Ambulatory referral to Podiatry -Referral to podiatry  Left foot pain - Plan: Ambulatory referral to Podiatry

## 2020-01-25 NOTE — Progress Notes (Signed)
[name] is a 86 y.o. female who presents for annual wellness visit, CPE and follow-up on chronic medical conditions.  She has the following concerns:  Right wrist injury due to recent fall. States it feels back to baseline. Bruising improved. Negative XR  HTN- doing well on amlodipine. No recent fluctuations in BP  Followed by cardiologist.  On Eliquis for hx of Afib and CVA no bleeding  Hypothyroidism- taking levothyroxine. Monitoring   HL- elev LDL. Aortic atherosclerosis on CT. Is not on statin. Patient preference. Hx of good HDL  Vitamin D- taking 2,000 IUs daily   Mass on left neck and she was not aware.    Immunization History  Administered Date(s) Administered  . Fluad Quad(high Dose 65+) 01/26/2020  . Gamma Globulin 10/09/1983, 12/18/1984, 07/24/1989  . Hepatitis A 10/19/1995  . Hepatitis B 06/08/2009  . IPV 12/07/1969, 12/19/1983, 11/25/1984, 01/08/1986  . Influenza, High Dose Seasonal PF 02/07/2017, 01/28/2018, 12/27/2018  . Influenza, Seasonal, Injecte, Preservative Fre 01/19/2014  . Influenza-Unspecified 01/19/2015, 12/30/2018  . OPV 10/19/1995  . PFIZER SARS-COV-2 Vaccination 06/14/2019, 07/09/2019  . Pneumococcal Conjugate-13 02/09/2015  . Pneumococcal Polysaccharide-23 07/06/2001, 01/14/2019  . Td 11/13/1978, 01/04/1989, 10/19/1995, 06/08/2009  . Tdap 08/26/2010  . Typhoid Inactivated 10/09/1983, 11/01/1989, 11/11/2002  . Typhoid Live 12/09/1995, 06/08/2009  . Yellow Fever 08/26/2010  . Zoster 09/12/2006  . Zoster Recombinat (Shingrix) 09/15/2016, 12/20/2016   Last Pap smear: hysterectomy Last mammogram: 7/21 Last colonoscopy: 2005- last one Last DEXA: 7/19 and ostepeonia  Dentist: once a year Ophtho: Dr. Katy Fitch Exercise: everday  Other doctors caring for patient include: Dr. Caryl Comes- Cardiology Dr. Delman Cheadle- Dermatology Dr. Katy Fitch- Eye Dr. Lenore Cordia- Dentist Dr. Jacqualyn Posey- [facility]   Depression screen:  See questionnaire below.   Depression screen [facility] 2/9 01/26/2020 07/24/2019 01/14/2019 01/03/2018 09/14/2016  Decreased Interest 0 0 0 0 0  Down, Depressed, Hopeless 0 0 0 0 0  PHQ - 2 Score 0 0 0 0 0    Fall Risk Screen: see questionnaire below. Fall Risk  01/26/2020 07/24/2019 01/14/2019 01/03/2018 09/14/2016  Falls in the past year? 1 1 0 No No  Number falls in past yr: 1 1 0 - -  Injury with Fall? 1 1 0 - -    ADL screen:  See questionnaire below Functional Status Survey: Is the patient deaf or have difficulty hearing?: No Does the patient have difficulty seeing, even when wearing glasses/contacts?: No Does the patient have difficulty concentrating, remembering, or making decisions?: No Does the patient have difficulty walking or climbing stairs?: No Does the patient have difficulty dressing or bathing?: No Does the patient have difficulty doing errands alone such as visiting a doctor's office or shopping?: No   End of Life Discussion:  Patient does not have a living will and medical power of attorney. States her daughter will be her HCPOA and she will get this legalized. MOST form discussed and filled out.     Review of Systems Constitutional: -fever, -chills, -sweats, -unexpected weight change, -anorexia, -fatigue Allergy: -sneezing, -itching, -congestion Dermatology: denies changing moles, rash, lumps, new worrisome lesions ENT: -runny nose, -ear pain, -sore throat, -hoarseness, -sinus pain, -teeth pain, -tinnitus, + chronic hearing loss, -epistaxis Cardiology:  -chest pain, -palpitations, -edema, -orthopnea, -paroxysmal nocturnal dyspnea Respiratory: -cough, -shortness of breath, -dyspnea on exertion, -wheezing, -hemoptysis Gastroenterology: -abdominal pain, -nausea, -vomiting, -diarrhea, + intermittent constipation, -blood in stool, -changes in bowel movement, -dysphagia Hematology: -bleeding or bruising problems Musculoskeletal: +arthralgias, -myalgias, -joint swelling, -back pain, -neck pain, -  cramping, -gait  changes Ophthalmology: -vision changes, -eye redness, -itching, -discharge Urology: -dysuria, -difficulty urinating, -hematuria, -urinary frequency, -urgency, -incontinence Neurology: -headache, -weakness, -tingling, -numbness, -speech abnormality, -memory loss, +falls, -dizziness Psychology:  -depressed mood, -agitation, -sleep problems    PHYSICAL EXAM:  BP 122/70   Pulse (!) 59   Ht 5\' 5"  (1.651 m)   Wt 122 lb 12.8 oz (55.7 kg)   BMI 20.43 kg/m   General Appearance: Alert, cooperative, no distress, appears stated age Head: Normocephalic, without obvious abnormality, atraumatic Eyes: PERRL, conjunctiva/corneas clear, EOM's intact Ears: Normal TM's and with mild amount of cerumen near canal opening. Hearing aids removed for eval Nose: mask on  Throat: mask on Neck: Supple, no lymphadenopathy; thyroid: no enlargement/tenderness/nodules; no carotid bruit or JVD Back: Spine nontender Lungs: Clear to auscultation bilaterally without wheezes, rales or ronchi; respirations unlabored Chest Wall: No tenderness or deformity Heart: Regular rate and rhythm Breast Exam: declines  Abdomen: Soft, non-tender, nondistended, normoactive bowel sounds, no masses, no hepatosplenomegaly Genitalia: declines  Extremities: No clubbing, cyanosis or edema Pulses: 2+ and symmetric all extremities Skin: Skin color, texture normal  Lymph nodes: round, firm, non tender left sided cervical mass, supraclavicular nodes normal.  Neurologic: CNII-XII intact, normal strength, sensation, gait is antalgic Psych: Normal mood, affect, hygiene and grooming.  ASSESSMENT/PLAN: Medicare annual wellness visit, subsequent -She is here for Medicare wellness visit.  Reviewed medications and providers.  Recent mechanical fall with right wrist injury which has basically returned to normal.  Denies any issues with memory, ADLs or mood.  In-depth counseling on advanced directives.  MOST form filled out.  Routine general  medical examination at a health care facility - Plan: CBC with Differential/Platelet, Comprehensive metabolic panel, TSH, T4, free, Lipid panel -Preventive health care reviewed.  She appears to be taking good care of herself with a healthy diet physical activity.  Immunizations reviewed  History of CVA (cerebrovascular accident) without residual deficits -Continue on Eliquis  Essential hypertension -Blood pressure controlled.  Hypothyroidism, unspecified type - Plan: TSH, T4, free -Check thyroid function and adjust dose as appropriate  Vitamin D deficiency - Plan: VITAMIN D 25 Hydroxy (Vit-D Deficiency, Fractures) -Currently taking a supplement.  Check vitamin D level and follow-up  Statin declined  Dyslipidemia -  Mass in neck -She will keep an eye on this and follow-up in 4 weeks  Needs flu shot - Plan: Flu Vaccine QUAD High Dose(Fluad)  Elevated LDL cholesterol level - Plan: Lipid panel -Discussed that her DL is in a good range.  She has declined statin in past  Advance directive discussed with patient -In-depth counseling done.  Paperwork provided.  Aortic atherosclerosis ([place]) -Per CT scan.     Discussed monthly self breast exams and yearly mammograms; at least 30 minutes of aerobic activity at least 5 days/week and weight-bearing exercise 2x/week; proper sunscreen use reviewed; healthy diet, including goals of calcium and vitamin D intake and alcohol recommendations (less than or equal to 1 drink/day) reviewed; regular seatbelt use; changing batteries in smoke detectors.  Immunization recommendations discussed.  Colonoscopy recommendations reviewed   Medicare Attestation I have personally reviewed: The patient's medical and social history Their use of alcohol, tobacco or illicit drugs Their current medications and supplements The patient's functional ability including ADLs,fall risks, home safety risks, cognitive, and hearing and visual impairment Diet and physical  activities Evidence for depression or mood disorders  The patient's weight, height, and BMI have been recorded in the chart.  I have made referrals, counseling,  and provided education to the patient based on review of the above and I have provided the patient with a written personalized care plan for preventive services.     Harland Dingwall, NP-C   01/26/2020

## 2020-01-26 ENCOUNTER — Other Ambulatory Visit: Payer: Self-pay

## 2020-01-26 ENCOUNTER — Encounter: Payer: Self-pay | Admitting: Family Medicine

## 2020-01-26 ENCOUNTER — Ambulatory Visit: Payer: Medicare PPO | Admitting: Family Medicine

## 2020-01-26 VITALS — BP 122/70 | HR 59 | Ht 65.0 in | Wt 122.8 lb

## 2020-01-26 DIAGNOSIS — I10 Essential (primary) hypertension: Secondary | ICD-10-CM | POA: Diagnosis not present

## 2020-01-26 DIAGNOSIS — Z0000 Encounter for general adult medical examination without abnormal findings: Secondary | ICD-10-CM | POA: Diagnosis not present

## 2020-01-26 DIAGNOSIS — Z23 Encounter for immunization: Secondary | ICD-10-CM

## 2020-01-26 DIAGNOSIS — E559 Vitamin D deficiency, unspecified: Secondary | ICD-10-CM | POA: Diagnosis not present

## 2020-01-26 DIAGNOSIS — Z86018 Personal history of other benign neoplasm: Secondary | ICD-10-CM | POA: Diagnosis not present

## 2020-01-26 DIAGNOSIS — Z5320 Procedure and treatment not carried out because of patient's decision for unspecified reasons: Secondary | ICD-10-CM | POA: Diagnosis not present

## 2020-01-26 DIAGNOSIS — D225 Melanocytic nevi of trunk: Secondary | ICD-10-CM | POA: Diagnosis not present

## 2020-01-26 DIAGNOSIS — E7800 Pure hypercholesterolemia, unspecified: Secondary | ICD-10-CM

## 2020-01-26 DIAGNOSIS — C44629 Squamous cell carcinoma of skin of left upper limb, including shoulder: Secondary | ICD-10-CM | POA: Diagnosis not present

## 2020-01-26 DIAGNOSIS — Z87898 Personal history of other specified conditions: Secondary | ICD-10-CM | POA: Diagnosis not present

## 2020-01-26 DIAGNOSIS — D2371 Other benign neoplasm of skin of right lower limb, including hip: Secondary | ICD-10-CM | POA: Diagnosis not present

## 2020-01-26 DIAGNOSIS — Z808 Family history of malignant neoplasm of other organs or systems: Secondary | ICD-10-CM | POA: Diagnosis not present

## 2020-01-26 DIAGNOSIS — E039 Hypothyroidism, unspecified: Secondary | ICD-10-CM

## 2020-01-26 DIAGNOSIS — I700 Atherosclerosis of aorta: Secondary | ICD-10-CM

## 2020-01-26 DIAGNOSIS — R221 Localized swelling, mass and lump, neck: Secondary | ICD-10-CM | POA: Insufficient documentation

## 2020-01-26 DIAGNOSIS — Z7189 Other specified counseling: Secondary | ICD-10-CM | POA: Diagnosis not present

## 2020-01-26 DIAGNOSIS — Z85828 Personal history of other malignant neoplasm of skin: Secondary | ICD-10-CM | POA: Diagnosis not present

## 2020-01-26 DIAGNOSIS — Z8673 Personal history of transient ischemic attack (TIA), and cerebral infarction without residual deficits: Secondary | ICD-10-CM | POA: Diagnosis not present

## 2020-01-26 DIAGNOSIS — L570 Actinic keratosis: Secondary | ICD-10-CM | POA: Diagnosis not present

## 2020-01-26 DIAGNOSIS — D224 Melanocytic nevi of scalp and neck: Secondary | ICD-10-CM | POA: Diagnosis not present

## 2020-01-26 DIAGNOSIS — S80861A Insect bite (nonvenomous), right lower leg, initial encounter: Secondary | ICD-10-CM | POA: Diagnosis not present

## 2020-01-26 DIAGNOSIS — E785 Hyperlipidemia, unspecified: Secondary | ICD-10-CM

## 2020-01-26 HISTORY — DX: Pure hypercholesterolemia, unspecified: E78.00

## 2020-01-26 NOTE — Patient Instructions (Addendum)
°  Ms. Rocca , Thank you for taking time to come for your Medicare Wellness Visit. I appreciate your ongoing commitment to your health goals. Please review the following plan we discussed and let me know if I can assist you in the future.   These are the goals we discussed:  Continue taking good care of yourself. Follow up with your specialists as recommended.   Keep an eye on the nodule on the side of your neck and let me know if it is changing or not back to normal in the next few weeks.   I will be in touch with your results from today.   Bring in the advance directives at your convenience if you decide to fill these out.     This is a list of the screening recommended for you and due dates:  Health Maintenance  Topic Date Due   Flu Shot  12/07/2019   Tetanus Vaccine  08/25/2020   DEXA scan (bone density measurement)  Completed   COVID-19 Vaccine  Completed   Pneumonia vaccines  Completed

## 2020-01-27 LAB — LIPID PANEL
Chol/HDL Ratio: 2.7 ratio (ref 0.0–4.4)
Cholesterol, Total: 205 mg/dL — ABNORMAL HIGH (ref 100–199)
HDL: 76 mg/dL (ref >39)
LDL Chol Calc (NIH): 118 mg/dL — ABNORMAL HIGH (ref 0–99)
Triglycerides: 62 mg/dL (ref 0–149)
VLDL Cholesterol Cal: 11 mg/dL (ref 5–40)

## 2020-01-27 LAB — CBC WITH DIFFERENTIAL/PLATELET
Basophils Absolute: 0.1 10*3/uL (ref 0.0–0.2)
Basos: 1 %
EOS (ABSOLUTE): 0.1 10*3/uL (ref 0.0–0.4)
Eos: 1 %
Hematocrit: 34.8 % (ref 34.0–46.6)
Hemoglobin: 11.5 g/dL (ref 11.1–15.9)
Immature Grans (Abs): 0 10*3/uL (ref 0.0–0.1)
Immature Granulocytes: 0 %
Lymphocytes Absolute: 1.5 10*3/uL (ref 0.7–3.1)
Lymphs: 20 %
MCH: 31.3 pg (ref 26.6–33.0)
MCHC: 33 g/dL (ref 31.5–35.7)
MCV: 95 fL (ref 79–97)
Monocytes Absolute: 0.9 10*3/uL (ref 0.1–0.9)
Monocytes: 12 %
Neutrophils Absolute: 5.1 10*3/uL (ref 1.4–7.0)
Neutrophils: 66 %
Platelets: 221 10*3/uL (ref 150–450)
RBC: 3.68 x10E6/uL — ABNORMAL LOW (ref 3.77–5.28)
RDW: 13.9 % (ref 11.7–15.4)
WBC: 7.8 10*3/uL (ref 3.4–10.8)

## 2020-01-27 LAB — COMPREHENSIVE METABOLIC PANEL
ALT: 9 IU/L (ref 0–32)
AST: 15 IU/L (ref 0–40)
Albumin/Globulin Ratio: 2 (ref 1.2–2.2)
Albumin: 4.5 g/dL (ref 3.6–4.6)
Alkaline Phosphatase: 76 IU/L (ref 44–121)
BUN/Creatinine Ratio: 16 (ref 12–28)
BUN: 10 mg/dL (ref 8–27)
Bilirubin Total: 0.4 mg/dL (ref 0.0–1.2)
CO2: 26 mmol/L (ref 20–29)
Calcium: 9.7 mg/dL (ref 8.7–10.3)
Chloride: 100 mmol/L (ref 96–106)
Creatinine, Ser: 0.63 mg/dL (ref 0.57–1.00)
GFR calc Af Amer: 94 mL/min/{1.73_m2} (ref >59)
GFR calc non Af Amer: 81 mL/min/{1.73_m2} (ref >59)
Globulin, Total: 2.3 g/dL (ref 1.5–4.5)
Glucose: 94 mg/dL (ref 65–99)
Potassium: 4.6 mmol/L (ref 3.5–5.2)
Sodium: 138 mmol/L (ref 134–144)
Total Protein: 6.8 g/dL (ref 6.0–8.5)

## 2020-01-27 LAB — TSH: TSH: 5.08 u[IU]/mL — ABNORMAL HIGH (ref 0.450–4.500)

## 2020-01-27 LAB — VITAMIN D 25 HYDROXY (VIT D DEFICIENCY, FRACTURES): Vit D, 25-Hydroxy: 51 ng/mL (ref 30.0–100.0)

## 2020-01-27 LAB — T4, FREE: Free T4: 1.31 ng/dL (ref 0.82–1.77)

## 2020-01-29 ENCOUNTER — Encounter: Payer: Self-pay | Admitting: Family Medicine

## 2020-02-02 ENCOUNTER — Ambulatory Visit (INDEPENDENT_AMBULATORY_CARE_PROVIDER_SITE_OTHER): Payer: Medicare PPO | Admitting: Podiatry

## 2020-02-02 ENCOUNTER — Other Ambulatory Visit: Payer: Self-pay

## 2020-02-02 ENCOUNTER — Encounter: Payer: Self-pay | Admitting: Family Medicine

## 2020-02-02 DIAGNOSIS — M79672 Pain in left foot: Secondary | ICD-10-CM

## 2020-02-02 DIAGNOSIS — M79671 Pain in right foot: Secondary | ICD-10-CM | POA: Diagnosis not present

## 2020-02-02 DIAGNOSIS — L989 Disorder of the skin and subcutaneous tissue, unspecified: Secondary | ICD-10-CM | POA: Diagnosis not present

## 2020-02-02 NOTE — Patient Instructions (Signed)
I have ordered a medication for you that will come from [facility] in [place]. They should be calling you to verify insurance and will mail the medication to you. If you live close by then you can go by their pharmacy to pick up the medication. Their phone number is [phone]. If you do not hear from them in the next few days, please give us a call at [phone].   

## 2020-02-03 ENCOUNTER — Other Ambulatory Visit: Payer: Self-pay

## 2020-02-03 MED ORDER — NONFORMULARY OR COMPOUNDED ITEM
Status: DC
Start: 2020-02-03 — End: 2020-02-26

## 2020-02-03 NOTE — Progress Notes (Signed)
Subjective:   Patient ID: [name], female   DOB: 86 y.o.   MRN: [id number]   HPI 86 year old female presents the office today for concerns of thick calluses, skin lesions to both of her feet with the left side worse than the right.  This is been ongoing for some time now.  She tried over-the-counter Dr. Felicie Morn wart remover.  No open sores and denies any swelling or redness.  She has no other concerns today.   Review of Systems  All other systems reviewed and are negative.  Past Medical History:  Diagnosis Date  . Anemia    hx of with surgery   . Arthritis 07-27-11   osteoarthritis-hips/s/p bil. THA, arthritis right hand   . Blepharitis of left eye   . Blood transfusion feb 2014   post surgery some time ago  . Cancer ([place]) 1989   Breast cancer -s/p lt. mastectomy, some squamous cell lesions  . Cold hands   . Complication of anesthesia    epinephrine - screams uncontrollably / pt does not want general anesthesia or narcotics  . Constipation   . Fuchs' corneal dystrophy    both eyes  . Hard of hearing    both ears mild loss  . Hyperkalemia 03/07/2018  . Hypertension   . Hypothyroidism 3-21- 13   tx. levothyroxine  . Lichen sclerosus et atrophicus of the vulva    pt reported.   . Osteopenia   . Stroke [facility])    a. s/p MDT LINQ 04/2014    Past Surgical History:  Procedure Laterality Date  . ABDOMINAL HYSTERECTOMY     ovaries removed  . ANKLE SURGERY  2005   right ankle tendon repair  . basal cell removed from face     3-4 times  . BREAST SURGERY  07-1987   lt.breast cancer intraductal cancer, mastectomy  . CATARACT EXTRACTION, BILATERAL  few yrs ago   Bilateral  . HARDWARE REMOVAL  04/05/2012   Procedure: HARDWARE REMOVAL;  Surgeon: Gearlean Alf, MD;  Location: [facility];  Service: Orthopedics;  Laterality: Right;  Removal of Hardware Right Femur   . HARDWARE REMOVAL Left 06/30/2013   Procedure: LEFT HIP HARDWARE REMOVAL OF CABLES;  Surgeon: Gearlean Alf, MD;   Location: [facility];  Service: Orthopedics;  Laterality: Left;  . HARDWARE REMOVAL Right 08/21/2013   Procedure: RIGHT HIP HARDWARE REMOVAL;  Surgeon: Gearlean Alf, MD;  Location: [facility];  Service: Orthopedics;  Laterality: Right;  . JOINT REPLACEMENT  07-27-11   Bilateral: Right THA - 04/2000, Left THA 06/2001  . left hip replacement Left feb 2003  . LOOP RECORDER IMPLANT  04-08-2014   MDT LINQ implanted by Dr Lovena Le for cryptogenic stroke  . LOOP RECORDER REMOVAL N/A 04/26/2017   Procedure: LOOP RECORDER REMOVAL;  Surgeon: Evans Lance, MD;  Location: [facility];  Service: Cardiovascular;  Laterality: N/A;  . MASTECTOMY  1989   left - Pt states has no restrictions on use of L arm for BP or blood draw  . melanoma removed Left    x 1  . ORIF FEMUR FRACTURE  07/31/2011   Procedure: OPEN REDUCTION INTERNAL FIXATION (ORIF) DISTAL FEMUR FRACTURE;  Surgeon: Gearlean Alf, MD;  Location: [facility];  Service: Orthopedics;  Laterality: Right;  right femur  (c-arm)   . SQUAMOUS CELL CARCINOMA EXCISION  07-27-11   x2 left shoulder  . TOTAL HIP REVISION  04/10/2012   Procedure: TOTAL HIP REVISION;  Surgeon: Gearlean Alf, MD;  Location: [facility];  Service: Orthopedics;  Laterality: Right;  . TOTAL HIP REVISION Left 06/27/2012   Procedure: LEFT TOTAL HIP REVISION;  Surgeon: Gearlean Alf, MD;  Location: [facility];  Service: Orthopedics;  Laterality: Left;     Current Outpatient Medications:  .  amLODipine (NORVASC) 5 MG tablet, Take 1 tablet (5 mg total) by mouth daily. TAKE 1 TABLET(5 MG) BY MOUTH EVERY MORNING, Disp: 90 tablet, Rfl: 1 .  apixaban (ELIQUIS) 2.5 MG TABS tablet, Take 1 tablet (2.5 mg total) by mouth 2 (two) times daily., Disp: 180 tablet, Rfl: 1 .  Coenzyme Q10 (COQ-10 PO), Take 1 tablet by mouth daily., Disp: , Rfl:  .  docusate sodium (COLACE) 100 MG capsule, Take 100 mg by mouth daily., Disp: , Rfl:  .  fluorouracil (EFUDEX) 5 % cream, , Disp: , Rfl:  .  levothyroxine  (SYNTHROID) 100 MCG tablet, Take 1 tablet (100 mcg total) by mouth daily., Disp: 90 tablet, Rfl: 1 .  Multiple Vitamins-Iron (MULTIVITAMINS WITH IRON) TABS tablet, Take 1 tablet by mouth daily. , Disp: , Rfl:  .  Omega-3 Fatty Acids (FISH OIL PO), Take 2 capsules by mouth. , Disp: , Rfl:  .  Propylene Glycol (SYSTANE BALANCE OP), Apply 1 drop to eye in the morning and at bedtime. , Disp: , Rfl:  .  Vitamin D, Cholecalciferol, 1000 UNITS TABS, Take 1,000 Units by mouth daily. , Disp: , Rfl:   Allergies  Allergen Reactions  . Lactose Intolerance (Gi)     Bloating, gas  . Epinephrine Other (See Comments)     Reaction:  Caused pt to scream uncontrollably.         Objective:  Physical Exam  General: AAO x3, NAD  Dermatological: Thick annular hyperkeratotic tissue present on the second interspace plantarly on the left foot as well as multiple small lesions present in the same area on the right foot.  Upon debridement there is no ongoing ulceration drainage or signs of infection.  No evidence of verruca.  Vascular: Dorsalis Pedis artery and Posterior Tibial artery pedal pulses are 2/4 bilateral with immedate capillary fill time.  There is no pain with calf compression, swelling, warmth, erythema.   Neruologic: Grossly intact via light touch bilateral.   Musculoskeletal: Tenderness palpation mostly on the left skin lesion.  Other debridement significant discomfort identified.  Muscular strength 5/5 in all groups tested bilateral.  Gait: Unassisted, Nonantalgic.       Assessment:   Skin lesions bilaterally     Plan:  -Treatment options discussed including all alternatives, risks, and complications -Etiology of symptoms were discussed -Sharply debrided lesions without any complications or bleeding.  I ordered a compound cream to the, apothecary to include salicylic acid, urea. -Offloading pads dispensed  Trula Slade DPM

## 2020-02-06 ENCOUNTER — Encounter: Payer: Self-pay | Admitting: Podiatry

## 2020-02-19 ENCOUNTER — Encounter: Payer: Self-pay | Admitting: Family Medicine

## 2020-02-20 ENCOUNTER — Encounter: Payer: Self-pay | Admitting: Family Medicine

## 2020-02-26 ENCOUNTER — Other Ambulatory Visit: Payer: Self-pay

## 2020-02-26 ENCOUNTER — Ambulatory Visit: Payer: Medicare PPO | Admitting: Family Medicine

## 2020-02-26 ENCOUNTER — Encounter: Payer: Self-pay | Admitting: Family Medicine

## 2020-02-26 VITALS — BP 120/70 | HR 65 | Wt 125.8 lb

## 2020-02-26 DIAGNOSIS — M6281 Muscle weakness (generalized): Secondary | ICD-10-CM | POA: Diagnosis not present

## 2020-02-26 DIAGNOSIS — E039 Hypothyroidism, unspecified: Secondary | ICD-10-CM

## 2020-02-26 DIAGNOSIS — M6289 Other specified disorders of muscle: Secondary | ICD-10-CM

## 2020-02-26 DIAGNOSIS — M25512 Pain in left shoulder: Secondary | ICD-10-CM | POA: Diagnosis not present

## 2020-02-26 NOTE — Progress Notes (Signed)
° °  Subjective:    Patient ID: [name], female    DOB: April 15, 1932, 86 y.o.   MRN: [id number]  HPI Chief Complaint  Patient presents with   follow-up    follow-up- shoulder pain. has PT on 11/1. having issues with arms, legs hurt   She is here for follow up on mass found on exam on the left side of her neck. She reports it is not bothersome and she does not feel it.   Complains of worsening muscle stiffness, especially in the morning, and joint pain. Left shoulder is becoming more painful.  States left shoulder is not moving as she wants it to.   She is having trouble getting up and bending over to pick things up off the floor.   We have been monitoring her thyroid function and she is taking levothyroxine.   Denies fever, chills, dizziness, chest pain, palpitations, shortness of breath, abdominal pain, N/V/D, urinary symptoms, LE edema.   Reviewed allergies, medications, past medical, surgical, family, and social history.    Review of Systems Pertinent positives and negatives in the history of present illness.     Objective:   Physical Exam BP 120/70    Pulse 65    Wt 125 lb 12.8 oz (57.1 kg)    BMI 20.93 kg/m   Ares of concern on the left side of her neck is her carotid notch which is easily palpated due to her thin body habitus. She is slow to rise from the chair and her gait is quite slow today.        Assessment & Plan:  Muscle stiffness - Plan: C-reactive protein, Sedimentation rate, CK, TSH, T4, free, CBC with Differential/Platelet, Comprehensive metabolic panel -check labs to rule out PMR. She has PT scheduled soon.   Decreased muscle strength - Plan: C-reactive protein, Sedimentation rate, CK, TSH, T4, free, CBC with Differential/Platelet, Comprehensive metabolic panel -this is generalized. Check labs and follow up. PT scheduled.   Pain in joint of left shoulder - Plan: C-reactive protein, Sedimentation rate, CK -labs and PT  Hypothyroidism, unspecified  type - Plan: TSH, T4, free -check thyroid function and consider dose change as appropriate.

## 2020-02-26 NOTE — Patient Instructions (Signed)
Try taking Tylenol 1,000 mg every 12 hours for the next couple of weeks and see how you feel.   You can use topical pain medications such as Salon Pas, Biofreeze, etc over the counter.   I will be in touch with your lab results.

## 2020-02-27 ENCOUNTER — Other Ambulatory Visit: Payer: Self-pay | Admitting: Family Medicine

## 2020-02-27 ENCOUNTER — Encounter: Payer: Self-pay | Admitting: Family Medicine

## 2020-02-27 ENCOUNTER — Other Ambulatory Visit: Payer: Self-pay | Admitting: Internal Medicine

## 2020-02-27 DIAGNOSIS — E039 Hypothyroidism, unspecified: Secondary | ICD-10-CM

## 2020-02-27 DIAGNOSIS — M791 Myalgia, unspecified site: Secondary | ICD-10-CM

## 2020-02-27 DIAGNOSIS — R7982 Elevated C-reactive protein (CRP): Secondary | ICD-10-CM

## 2020-02-27 DIAGNOSIS — M2550 Pain in unspecified joint: Secondary | ICD-10-CM

## 2020-02-27 LAB — COMPREHENSIVE METABOLIC PANEL
ALT: 14 IU/L (ref 0–32)
AST: 16 IU/L (ref 0–40)
Albumin/Globulin Ratio: 1.5 (ref 1.2–2.2)
Albumin: 3.9 g/dL (ref 3.6–4.6)
Alkaline Phosphatase: 71 IU/L (ref 44–121)
BUN/Creatinine Ratio: 19 (ref 12–28)
BUN: 11 mg/dL (ref 8–27)
Bilirubin Total: 0.3 mg/dL (ref 0.0–1.2)
CO2: 25 mmol/L (ref 20–29)
Calcium: 9 mg/dL (ref 8.7–10.3)
Chloride: 97 mmol/L (ref 96–106)
Creatinine, Ser: 0.58 mg/dL (ref 0.57–1.00)
GFR calc Af Amer: 97 mL/min/{1.73_m2} (ref >59)
GFR calc non Af Amer: 84 mL/min/{1.73_m2} (ref >59)
Globulin, Total: 2.6 g/dL (ref 1.5–4.5)
Glucose: 95 mg/dL (ref 65–99)
Potassium: 4.4 mmol/L (ref 3.5–5.2)
Sodium: 134 mmol/L (ref 134–144)
Total Protein: 6.5 g/dL (ref 6.0–8.5)

## 2020-02-27 LAB — CBC WITH DIFFERENTIAL/PLATELET
Basophils Absolute: 0.1 10*3/uL (ref 0.0–0.2)
Basos: 1 %
EOS (ABSOLUTE): 0.1 10*3/uL (ref 0.0–0.4)
Eos: 1 %
Hematocrit: 31.2 % — ABNORMAL LOW (ref 34.0–46.6)
Hemoglobin: 10.2 g/dL — ABNORMAL LOW (ref 11.1–15.9)
Immature Grans (Abs): 0 10*3/uL (ref 0.0–0.1)
Immature Granulocytes: 0 %
Lymphocytes Absolute: 1.3 10*3/uL (ref 0.7–3.1)
Lymphs: 17 %
MCH: 30.1 pg (ref 26.6–33.0)
MCHC: 32.7 g/dL (ref 31.5–35.7)
MCV: 92 fL (ref 79–97)
Monocytes Absolute: 1 10*3/uL — ABNORMAL HIGH (ref 0.1–0.9)
Monocytes: 14 %
Neutrophils Absolute: 5.1 10*3/uL (ref 1.4–7.0)
Neutrophils: 67 %
Platelets: 275 10*3/uL (ref 150–450)
RBC: 3.39 x10E6/uL — ABNORMAL LOW (ref 3.77–5.28)
RDW: 13.7 % (ref 11.7–15.4)
WBC: 7.7 10*3/uL (ref 3.4–10.8)

## 2020-02-27 LAB — T4, FREE: Free T4: 1.11 ng/dL (ref 0.82–1.77)

## 2020-02-27 LAB — CK: Total CK: 103 U/L (ref 26–161)

## 2020-02-27 LAB — SEDIMENTATION RATE: Sed Rate: 72 mm/hr — ABNORMAL HIGH (ref 0–40)

## 2020-02-27 LAB — TSH: TSH: 8.51 u[IU]/mL — ABNORMAL HIGH (ref 0.450–4.500)

## 2020-02-27 LAB — C-REACTIVE PROTEIN: CRP: 54 mg/L — ABNORMAL HIGH (ref 0–10)

## 2020-02-27 MED ORDER — LEVOTHYROXINE SODIUM 112 MCG PO TABS: 112.0000 ug | ORAL_TABLET | Freq: Every day | ORAL | 2 refills | Status: DC

## 2020-02-27 NOTE — Progress Notes (Signed)
Please put in a referral to rheumatology for elevated CRP and arthralgias and myalgias

## 2020-02-28 ENCOUNTER — Encounter: Payer: Self-pay | Admitting: Family Medicine

## 2020-03-04 ENCOUNTER — Encounter: Payer: Self-pay | Admitting: Family Medicine

## 2020-03-08 DIAGNOSIS — M25611 Stiffness of right shoulder, not elsewhere classified: Secondary | ICD-10-CM | POA: Diagnosis not present

## 2020-03-08 DIAGNOSIS — M25651 Stiffness of right hip, not elsewhere classified: Secondary | ICD-10-CM | POA: Diagnosis not present

## 2020-03-08 DIAGNOSIS — M25652 Stiffness of left hip, not elsewhere classified: Secondary | ICD-10-CM | POA: Diagnosis not present

## 2020-03-08 DIAGNOSIS — M25662 Stiffness of left knee, not elsewhere classified: Secondary | ICD-10-CM | POA: Diagnosis not present

## 2020-03-08 DIAGNOSIS — M25612 Stiffness of left shoulder, not elsewhere classified: Secondary | ICD-10-CM | POA: Diagnosis not present

## 2020-03-08 DIAGNOSIS — M25661 Stiffness of right knee, not elsewhere classified: Secondary | ICD-10-CM | POA: Diagnosis not present

## 2020-03-08 DIAGNOSIS — M5459 Other low back pain: Secondary | ICD-10-CM | POA: Diagnosis not present

## 2020-03-09 ENCOUNTER — Encounter: Payer: Self-pay | Admitting: Family Medicine

## 2020-03-16 ENCOUNTER — Encounter: Payer: Self-pay | Admitting: Family Medicine

## 2020-03-17 ENCOUNTER — Other Ambulatory Visit: Payer: Self-pay | Admitting: Family Medicine

## 2020-03-17 ENCOUNTER — Encounter: Payer: Self-pay | Admitting: Family Medicine

## 2020-03-17 DIAGNOSIS — M2550 Pain in unspecified joint: Secondary | ICD-10-CM

## 2020-03-17 DIAGNOSIS — M25512 Pain in left shoulder: Secondary | ICD-10-CM

## 2020-03-17 DIAGNOSIS — R700 Elevated erythrocyte sedimentation rate: Secondary | ICD-10-CM

## 2020-03-17 MED ORDER — PREDNISONE 10 MG PO TABS: 10.0000 mg | ORAL_TABLET | Freq: Every day | ORAL | 0 refills | Status: DC

## 2020-03-24 ENCOUNTER — Encounter: Payer: Self-pay | Admitting: Internal Medicine

## 2020-03-24 ENCOUNTER — Encounter: Payer: Self-pay | Admitting: Family Medicine

## 2020-03-24 DIAGNOSIS — M8580 Other specified disorders of bone density and structure, unspecified site: Secondary | ICD-10-CM | POA: Insufficient documentation

## 2020-03-25 DIAGNOSIS — C44629 Squamous cell carcinoma of skin of left upper limb, including shoulder: Secondary | ICD-10-CM | POA: Diagnosis not present

## 2020-03-25 DIAGNOSIS — L905 Scar conditions and fibrosis of skin: Secondary | ICD-10-CM | POA: Diagnosis not present

## 2020-04-05 DIAGNOSIS — M2550 Pain in unspecified joint: Secondary | ICD-10-CM | POA: Diagnosis not present

## 2020-04-05 DIAGNOSIS — M353 Polymyalgia rheumatica: Secondary | ICD-10-CM | POA: Diagnosis not present

## 2020-04-05 DIAGNOSIS — Z6820 Body mass index (BMI) 20.0-20.9, adult: Secondary | ICD-10-CM | POA: Diagnosis not present

## 2020-04-06 ENCOUNTER — Encounter: Payer: Self-pay | Admitting: Family Medicine

## 2020-04-07 ENCOUNTER — Encounter: Payer: Self-pay | Admitting: Family Medicine

## 2020-04-13 ENCOUNTER — Encounter: Payer: Self-pay | Admitting: Family Medicine

## 2020-04-13 DIAGNOSIS — E039 Hypothyroidism, unspecified: Secondary | ICD-10-CM

## 2020-04-13 MED ORDER — LEVOTHYROXINE SODIUM 112 MCG PO TABS: 112.0000 ug | ORAL_TABLET | Freq: Every day | ORAL | 0 refills | Status: DC

## 2020-05-24 ENCOUNTER — Encounter: Payer: Self-pay | Admitting: Family Medicine

## 2020-05-24 DIAGNOSIS — E039 Hypothyroidism, unspecified: Secondary | ICD-10-CM

## 2020-05-24 MED ORDER — LEVOTHYROXINE SODIUM 112 MCG PO TABS: 112.0000 ug | ORAL_TABLET | Freq: Every day | ORAL | 0 refills | Status: DC

## 2020-05-24 NOTE — Telephone Encounter (Signed)
Please advise 

## 2020-06-07 DIAGNOSIS — M353 Polymyalgia rheumatica: Secondary | ICD-10-CM | POA: Diagnosis not present

## 2020-06-07 DIAGNOSIS — Z6820 Body mass index (BMI) 20.0-20.9, adult: Secondary | ICD-10-CM | POA: Diagnosis not present

## 2020-06-07 DIAGNOSIS — R8290 Unspecified abnormal findings in urine: Secondary | ICD-10-CM | POA: Diagnosis not present

## 2020-06-07 DIAGNOSIS — M2550 Pain in unspecified joint: Secondary | ICD-10-CM | POA: Diagnosis not present

## 2020-06-17 ENCOUNTER — Encounter: Payer: Self-pay | Admitting: Family Medicine

## 2020-06-28 ENCOUNTER — Encounter: Payer: Self-pay | Admitting: Family Medicine

## 2020-06-29 ENCOUNTER — Other Ambulatory Visit: Payer: Self-pay

## 2020-06-29 ENCOUNTER — Telehealth (INDEPENDENT_AMBULATORY_CARE_PROVIDER_SITE_OTHER): Payer: Medicare PPO | Admitting: Internal Medicine

## 2020-06-29 VITALS — BP 133/69 | HR 60 | Ht 65.0 in | Wt 123.0 lb

## 2020-06-29 DIAGNOSIS — I480 Paroxysmal atrial fibrillation: Secondary | ICD-10-CM

## 2020-06-29 DIAGNOSIS — Z9889 Other specified postprocedural states: Secondary | ICD-10-CM

## 2020-06-29 DIAGNOSIS — I639 Cerebral infarction, unspecified: Secondary | ICD-10-CM

## 2020-06-29 MED ORDER — FLECAINIDE ACETATE 100 MG PO TABS: 300.0000 mg | ORAL_TABLET | ORAL | 0 refills | Status: DC | PRN

## 2020-06-29 NOTE — Patient Instructions (Addendum)
  Medication Instructions:  Your physician has recommended you make the following change in your medication:   Begin Flecainide 100mg  tablets - Take 3 tablets(300mg ) as needed for Afib episode.  Prescription has been sent to [facility].    *If you need a refill on your cardiac medications before your next appointment, please call your pharmacy*   Lab Work: None ordered.  If you have labs (blood work) drawn today and your tests are completely normal, you will receive your results only by: Marland Kitchen MyChart Message (if you have MyChart) OR . A paper copy in the mail If you have any lab test that is abnormal or we need to change your treatment, we will call you to review the results.   Testing/Procedures:  Someone will call you to schedule testing. Your physician has requested that you have an echocardiogram. Echocardiography is a painless test that uses sound waves to create images of your heart. It provides your doctor with information about the size and shape of your heart and how well your heart's chambers and valves are working. This procedure takes approximately one hour. There are no restrictions for this procedure.     Follow-Up: At [facility], you and your health needs are our priority.  As part of our continuing mission to provide you with exceptional heart care, we have created designated Provider Care Teams.  These Care Teams include your primary Cardiologist (physician) and Advanced Practice Providers (APPs -  Physician Assistants and Nurse Practitioners) who all work together to provide you with the care you need, when you need it.  We recommend signing up for the patient portal called "MyChart".  Sign up information is provided on this After Visit Summary.  MyChart is used to connect with patients for Virtual Visits (Telemedicine).  Patients are able to view lab/test results, encounter notes, upcoming appointments, etc.  Non-urgent messages can be sent to your provider as  well.   To learn more about what you can do with MyChart, go to [email or web address].    Your next appointment:   6 month(s)  The format for your next appointment:   In Person  Provider:   Virl Axe, MD

## 2020-06-29 NOTE — Progress Notes (Unsigned)
Electrophysiology TeleHealth Note   Due to national recommendations of social distancing due to COVID 19, an audio/video telehealth visit is felt to be most appropriate for this patient at this time.  See MyChart message from today for the patient's consent to telehealth for [facility].   Date:  06/29/2020   ID:  [name], DOB 06-02-1931, MRN [id number]  Location: patient's home  Provider location: [address] [place]  Evaluation Performed: Follow-up visit   PCP:  Girtha Rm, NP-C  Cardiologist:    Electrophysiologist:  SK   Chief Complaint:  I am 1/2 hr late and that has cancelled this appointment   History of Present Illness:    [name] is a 87 y.o. female who presents via audio/video conferencing for a telehealth visit today.  Since last being seen in our clinic for Atrial fib - paroxysmal with asymptomatic post termination pauses and prior hx of Cryptogenic Stroke with implanted LINQ   the patient reports recurrent atrial fibrillation  Last episode 8/20 ( she thinks)  Symptomatic  July 2021 RMSF >> doxy>> developed PMR>>steroids  Rheumatology>>maintained at 10 mg 12/21 >> Afib assoc with LH (lost   7hrs   2/22>> recurrent AFib  About 8 hrs Episodes are assoc with profound weakness and fatigue, some LH and dyspnea   Now fear of Damocles   Date Cr K Hgb  3/20 0.75 6.1>>4.8 12.4  3/21 0.75 4.6 12.5   Elevated K was attributed to lisinopril ( thought we had stopped it last year 2/2 cough)  HCTZ started and repeat K As above    Echo 11/15 > normal LV function with mild LAE/ Pulm HTN   The patient denies symptoms of fevers, chills, cough, or new SOB worrisome for COVID 19.   Past Medical History:  Diagnosis Date  . Anemia    hx of with surgery   . Arthritis 07-27-11   osteoarthritis-hips/s/p bil. THA, arthritis right hand   . Blepharitis of left eye   . Blood transfusion feb 2014   post surgery some time ago  . Cancer ([place])  1989   Breast cancer -s/p lt. mastectomy, some squamous cell lesions  . Cold hands   . Complication of anesthesia    epinephrine - screams uncontrollably / pt does not want general anesthesia or narcotics  . Constipation   . Fuchs' corneal dystrophy    both eyes  . Hard of hearing    both ears mild loss  . Hyperkalemia 03/07/2018  . Hypertension   . Hypothyroidism 3-21- 13   tx. levothyroxine  . Lichen sclerosus et atrophicus of the vulva    pt reported.   . Osteopenia   . Stroke [facility])    a. s/p MDT LINQ 04/2014    Past Surgical History:  Procedure Laterality Date  . ABDOMINAL HYSTERECTOMY     ovaries removed  . ANKLE SURGERY  2005   right ankle tendon repair  . basal cell removed from face     3-4 times  . BREAST SURGERY  07-1987   lt.breast cancer intraductal cancer, mastectomy  . CATARACT EXTRACTION, BILATERAL  few yrs ago   Bilateral  . HARDWARE REMOVAL  04/05/2012   Procedure: HARDWARE REMOVAL;  Surgeon: Gearlean Alf, MD;  Location: [facility];  Service: Orthopedics;  Laterality: Right;  Removal of Hardware Right Femur   . HARDWARE REMOVAL Left 06/30/2013   Procedure: LEFT HIP HARDWARE REMOVAL OF CABLES;  Surgeon: Gearlean Alf, MD;  [facility]: [facility];  Service: Orthopedics;  Laterality: Left;  . HARDWARE REMOVAL Right 08/21/2013   Procedure: RIGHT HIP HARDWARE REMOVAL;  Surgeon: Gearlean Alf, MD;  [facility];  Service: Orthopedics;  Laterality: Right;  . JOINT REPLACEMENT  07-27-11   Bilateral: Right THA - 04/2000, Left THA 06/2001  . left hip replacement Left feb 2003  . LOOP RECORDER IMPLANT  04-08-2014   MDT LINQ implanted by Dr Lovena Le for cryptogenic stroke  . LOOP RECORDER REMOVAL N/A 04/26/2017   Procedure: LOOP RECORDER REMOVAL;  Surgeon: Evans Lance, MD;  Location: [place] [facility];  Service: Cardiovascular;  Laterality: N/A;  . MASTECTOMY  1989   left - Pt states has no restrictions on use of L arm for BP or blood draw  . melanoma removed Left     x 1  . ORIF FEMUR FRACTURE  07/31/2011   Procedure: OPEN REDUCTION INTERNAL FIXATION (ORIF) DISTAL FEMUR FRACTURE;  Surgeon: Gearlean Alf, MD;  [facility];  Service: Orthopedics;  Laterality: Right;  right femur  (c-arm)   . SQUAMOUS CELL CARCINOMA EXCISION  07-27-11   x2 left shoulder  . TOTAL HIP REVISION  04/10/2012   Procedure: TOTAL HIP REVISION;  Surgeon: Gearlean Alf, MD;  [facility];  Service: Orthopedics;  Laterality: Right;  . TOTAL HIP REVISION Left 06/27/2012   Procedure: LEFT TOTAL HIP REVISION;  Surgeon: Gearlean Alf, MD;  [facility];  Service: Orthopedics;  Laterality: Left;    Current Outpatient Medications  Medication Sig Dispense Refill  . amLODipine (NORVASC) 5 MG tablet Take 1 tablet (5 mg total) by mouth daily. TAKE 1 TABLET(5 MG) BY MOUTH EVERY MORNING 90 tablet 1  . apixaban (ELIQUIS) 2.5 MG TABS tablet Take 1 tablet (2.5 mg total) by mouth 2 (two) times daily. 180 tablet 1  . B COMPLEX-C PO Take by mouth.    . Coenzyme Q10 (COQ-10 PO) Take 1 tablet by mouth daily.    Marland Kitchen levothyroxine (SYNTHROID) 112 MCG tablet Take 1 tablet (112 mcg total) by mouth daily. 90 tablet 0  . Multiple Vitamins-Iron (MULTIVITAMINS WITH IRON) TABS tablet Take 1 tablet by mouth daily.     . Omega-3 Fatty Acids (FISH OIL PO) Take 2 capsules by mouth.     . predniSONE (DELTASONE) 10 MG tablet Take 1 tablet (10 mg total) by mouth daily with breakfast. 30 tablet 0  . Propylene Glycol (SYSTANE BALANCE OP) Apply 1 drop to eye in the morning and at bedtime.     . Vitamin D, Cholecalciferol, 1000 UNITS TABS Take 1,000 Units by mouth daily.     Marland Kitchen docusate sodium (COLACE) 100 MG capsule Take 100 mg by mouth daily. (Patient not taking: Reported on 06/29/2020)     No current facility-administered medications for this visit.    Allergies:   Lactose intolerance (gi) and Epinephrine   Social History:  The patient  reports that she quit smoking about 57 years ago. Her smoking use  included cigarettes. She has a 10.00 pack-year smoking history. She has never used smokeless tobacco. She reports current alcohol use of about 1.0 standard drink of alcohol per week. She reports that she does not use drugs.   Family History:  The patient's   family history includes Arthritis in her sister; Cancer in her brother, father, and mother; Celiac disease in her mother; Heart disease in her paternal grandfather and paternal grandmother; Hodgkin's lymphoma  in her mother; Melanoma in her father; Ulcerative colitis in her sister.   ROS:  Please see the history of present illness.   All other systems are personally reviewed and negative.    Exam:    Vital Signs:  BP 133/69   Pulse 60   Ht 5\' 5"  (1.651 m)   Wt 123 lb (55.8 kg)   BMI 20.47 kg/m      Labs/Other Tests and Data Reviewed:    Recent Labs: 11/30/2019: Magnesium 1.9 02/26/2020: ALT 14; BUN 11; Creatinine, Ser 0.58; Hemoglobin 10.2; Platelets 275; Potassium 4.4; Sodium 134; TSH 8.510   Wt Readings from Last 3 Encounters:  06/29/20 123 lb (55.8 kg)  02/26/20 125 lb 12.8 oz (57.1 kg)  01/26/20 122 lb 12.8 oz (55.7 kg)     Other studies personally reviewed: Additional studies/ records that were reviewed today include:As above     ASSESSMENT & PLAN:    Cryptogenic Stroke  Atrial fibrillation    Post termination pauses  asym  Linq Monitor   Fatigue  Hypertension  Renal insufficiency grade 3  Increasing episodes of atrial fib, 2 in the last 2 Months.  Not sure if this will translate into a changed trajectory perhaps related to volume shifts assoc with steroids for PMR or not] Discussed that DCCV which had been useful for a friend of hers would not be helpful as her issue now is recurrent afib events-- Offered prn flecainide as cocktail and discussed that normally, nonCOVID, the first dose is done in hospital.  Now however that is impractical both for reasons of exposure risk as well as the very long wait  times.  Hence will presume low risk.   Last echo was unrevealing albeit 7 yrs ago, so will reorder  Advised that if she had worsening of symptoms following its initiation , then she should call 911    COVID 19 screen The patient denies symptoms of COVID 19 at this time.  The importance of social distancing was discussed today.  Follow-up:  48m    Current medicines are reviewed at length with the patient today.   The patient does not have concerns regarding her medicines.  The following changes were made today:  Flecainide 300 po as needed for episodes  Disp 9  Labs/ tests ordered today include:echo  No orders of the defined types were placed in this encounter.   Future tests ( post COVID )     Patient Risk:  after full review of this patients clinical status, I feel that they are at moderate  risk at this time.  Today, I have spent 19 minutes with the patient with telehealth technology discussing the above.   Signed, Virl Axe, MD  06/29/2020 3:32 PM     [address] [place] [address] [phone] (office) [phone] (fax)

## 2020-07-15 ENCOUNTER — Telehealth: Payer: Medicare PPO | Admitting: Internal Medicine

## 2020-07-17 ENCOUNTER — Other Ambulatory Visit: Payer: Self-pay | Admitting: Family Medicine

## 2020-07-19 NOTE — Telephone Encounter (Signed)
Is this okay to refill? 

## 2020-07-29 ENCOUNTER — Ambulatory Visit (HOSPITAL_COMMUNITY): Payer: Medicare PPO | Attending: Cardiovascular Disease

## 2020-07-29 ENCOUNTER — Other Ambulatory Visit: Payer: Self-pay

## 2020-07-29 DIAGNOSIS — I480 Paroxysmal atrial fibrillation: Secondary | ICD-10-CM | POA: Diagnosis not present

## 2020-07-29 LAB — ECHOCARDIOGRAM COMPLETE
Area-P 1/2: 2.91 cm2
S' Lateral: 2.6 cm

## 2020-08-02 DIAGNOSIS — M353 Polymyalgia rheumatica: Secondary | ICD-10-CM | POA: Diagnosis not present

## 2020-08-02 DIAGNOSIS — Z6820 Body mass index (BMI) 20.0-20.9, adult: Secondary | ICD-10-CM | POA: Diagnosis not present

## 2020-08-02 DIAGNOSIS — M2550 Pain in unspecified joint: Secondary | ICD-10-CM | POA: Diagnosis not present

## 2020-08-16 ENCOUNTER — Encounter: Payer: Self-pay | Admitting: Family Medicine

## 2020-08-16 NOTE — Telephone Encounter (Signed)
Attempted phone call to pt.  Left voicemail message to contact RN at [phone]. 

## 2020-08-17 NOTE — Telephone Encounter (Signed)
Spoke with pt who reports an episode of Afib over the weekend which lead to fainting.  Pt states she did not hit her head and is feeling fine now with the exception of a scraped elbow.  Pt reports she took 300mg  of Flecainide when Afib started and she believes it dropped her heart rate too low.  B/P and HR WNL per pt.  Pt would like to know if Flecainide dosage needs decreasing should she have an Afib episode again.    Reviewed ED precautions with pt.  Will forward information to Dr Caryl Comes for review and recommendation.  Pt verbalizes understanding and agrees with current plan.

## 2020-08-17 NOTE — Telephone Encounter (Signed)
Patient is returning call.  °

## 2020-08-19 NOTE — Telephone Encounter (Signed)
Ladies, was wondering if I might be able to get some help as I had out of town for few weeks.  Clearly the flecainide as needed strategy is not can work for her.  I am not quite sure why the faint although in the context of volume loss both with diarrhea as well as potentially from the onset of A. fib she had a vagally mediated reaction that persisted for some time.  In any case, with increasing burden of atrial fibrillation I think an antiarrhythmic drug for control of the atrial fibrillation as opposed to in response to the atrial fibrillation would be appropriate.  This will require a conversation.  Thanks very much

## 2020-08-24 ENCOUNTER — Encounter: Payer: Self-pay | Admitting: Internal Medicine

## 2020-08-25 NOTE — Telephone Encounter (Signed)
Attempted phone call to pt.  Left voicemail message to contact RN at [phone]. 

## 2020-08-26 ENCOUNTER — Telehealth: Payer: Self-pay | Admitting: Internal Medicine

## 2020-08-26 ENCOUNTER — Encounter (HOSPITAL_COMMUNITY): Payer: Self-pay | Admitting: Physician Assistant

## 2020-08-26 ENCOUNTER — Other Ambulatory Visit: Payer: Self-pay

## 2020-08-26 ENCOUNTER — Ambulatory Visit (HOSPITAL_COMMUNITY)
Admission: RE | Admit: 2020-08-26 | Discharge: 2020-08-26 | Disposition: A | Discharge status: Home / Self Care | Payer: Medicare PPO | Source: Ambulatory Visit | Attending: Physician Assistant | Admitting: Physician Assistant

## 2020-08-26 VITALS — BP 142/68 | HR 71 | Ht 65.0 in | Wt 121.6 lb

## 2020-08-26 DIAGNOSIS — Z87891 Personal history of nicotine dependence: Secondary | ICD-10-CM | POA: Diagnosis not present

## 2020-08-26 DIAGNOSIS — Z79899 Other long term (current) drug therapy: Secondary | ICD-10-CM | POA: Insufficient documentation

## 2020-08-26 DIAGNOSIS — Z7901 Long term (current) use of anticoagulants: Secondary | ICD-10-CM | POA: Diagnosis not present

## 2020-08-26 DIAGNOSIS — I083 Combined rheumatic disorders of mitral, aortic and tricuspid valves: Secondary | ICD-10-CM | POA: Diagnosis not present

## 2020-08-26 DIAGNOSIS — D6869 Other thrombophilia: Secondary | ICD-10-CM | POA: Diagnosis not present

## 2020-08-26 DIAGNOSIS — I10 Essential (primary) hypertension: Secondary | ICD-10-CM | POA: Diagnosis not present

## 2020-08-26 DIAGNOSIS — I480 Paroxysmal atrial fibrillation: Secondary | ICD-10-CM | POA: Diagnosis not present

## 2020-08-26 MED ORDER — FLECAINIDE ACETATE 50 MG PO TABS
50.0000 mg | ORAL_TABLET | Freq: Two times a day (BID) | ORAL | 3 refills | Status: DC
Start: 2020-08-26 — End: 2020-09-06

## 2020-08-26 MED ORDER — DILTIAZEM HCL ER COATED BEADS 120 MG PO CP24: 120.0000 mg | ORAL_CAPSULE | Freq: Every day | ORAL | 3 refills | Status: DC

## 2020-08-26 NOTE — Progress Notes (Signed)
Primary Care Physician: Girtha Rm, NP-C Primary Electrophysiologist: Dr Caryl Comes Referring Physician: Dr Marcene Duos is a 87 y.o. female with a history of prior CVA, HTN, hypothyroidism, atrial fibrillation who presents for follow up in the [facility]. The patient was initially diagnosed with atrial fibrillation on an ILR after a cryptogenic stroke. Patient is on Eliquis for a CHADS2VASC score of 6. She was recently started on PRN flecainide which she took on 08/14/20. This did help her restore SR but she also had a fainting spell. She does have a h/o post termination pauses.   Today, she denies symptoms of palpitations, chest pain, shortness of breath, orthopnea, PND, lower extremity edema, dizziness, presyncope, syncope, snoring, daytime somnolence, bleeding, or neurologic sequela. The patient is tolerating medications without difficulties and is otherwise without complaint today.    Atrial Fibrillation Risk Factors:  she does not have symptoms or diagnosis of sleep apnea. she does not have a history of rheumatic fever.   she has a BMI of Body mass index is 20.24 kg/m.Marland Kitchen Filed Weights   08/26/20 1525  Weight: 55.2 kg    Family History  Problem Relation Age of Onset  . Hodgkin's lymphoma Mother   . Celiac disease Mother   . Cancer Mother   . Melanoma Father   . Cancer Father   . Cancer Brother   . Arthritis Sister   . Ulcerative colitis Sister   . Heart disease Paternal Grandmother   . Heart disease Paternal Grandfather      Atrial Fibrillation Management history:  Previous antiarrhythmic drugs: flecainide  Previous cardioversions: none Previous ablations: none CHADS2VASC score: 6 Anticoagulation history: Eliquis   Past Medical History:  Diagnosis Date  . Anemia    hx of with surgery   . Arthritis 07-27-11   osteoarthritis-hips/s/p bil. THA, arthritis right hand   . Blepharitis of left eye   . Blood transfusion feb  2014   post surgery some time ago  . Cancer ([place]) 1989   Breast cancer -s/p lt. mastectomy, some squamous cell lesions  . Cold hands   . Complication of anesthesia    epinephrine - screams uncontrollably / pt does not want general anesthesia or narcotics  . Constipation   . Fuchs' corneal dystrophy    both eyes  . Hard of hearing    both ears mild loss  . Hyperkalemia 03/07/2018  . Hypertension   . Hypothyroidism 3-21- 13   tx. levothyroxine  . Lichen sclerosus et atrophicus of the vulva    pt reported.   . Osteopenia   . Stroke [facility])    a. s/p MDT LINQ 04/2014   Past Surgical History:  Procedure Laterality Date  . ABDOMINAL HYSTERECTOMY     ovaries removed  . ANKLE SURGERY  2005   right ankle tendon repair  . basal cell removed from face     3-4 times  . BREAST SURGERY  07-1987   lt.breast cancer intraductal cancer, mastectomy  . CATARACT EXTRACTION, BILATERAL  few yrs ago   Bilateral  . HARDWARE REMOVAL  04/05/2012   Procedure: HARDWARE REMOVAL;  Surgeon: Gearlean Alf, MD;  [facility];  Service: Orthopedics;  Laterality: Right;  Removal of Hardware Right Femur   . HARDWARE REMOVAL Left 06/30/2013   Procedure: LEFT HIP HARDWARE REMOVAL OF CABLES;  Surgeon: Gearlean Alf, MD;  [facility];  Service: Orthopedics;  Laterality: Left;  . HARDWARE REMOVAL Right  08/21/2013   Procedure: RIGHT HIP HARDWARE REMOVAL;  Surgeon: Gearlean Alf, MD;  [facility];  Service: Orthopedics;  Laterality: Right;  . JOINT REPLACEMENT  07-27-11   Bilateral: Right THA - 04/2000, Left THA 06/2001  . left hip replacement Left feb 2003  . LOOP RECORDER IMPLANT  04-08-2014   MDT LINQ implanted by Dr Lovena Le for cryptogenic stroke  . LOOP RECORDER REMOVAL N/A 04/26/2017   Procedure: LOOP RECORDER REMOVAL;  Surgeon: Evans Lance, MD;  Location: [place] [facility];  Service: Cardiovascular;  Laterality: N/A;  . MASTECTOMY  1989   left - Pt states has no restrictions on use of L  arm for BP or blood draw  . melanoma removed Left    x 1  . ORIF FEMUR FRACTURE  07/31/2011   Procedure: OPEN REDUCTION INTERNAL FIXATION (ORIF) DISTAL FEMUR FRACTURE;  Surgeon: Gearlean Alf, MD;  [facility];  Service: Orthopedics;  Laterality: Right;  right femur  (c-arm)   . SQUAMOUS CELL CARCINOMA EXCISION  07-27-11   x2 left shoulder  . TOTAL HIP REVISION  04/10/2012   Procedure: TOTAL HIP REVISION;  Surgeon: Gearlean Alf, MD;  [facility];  Service: Orthopedics;  Laterality: Right;  . TOTAL HIP REVISION Left 06/27/2012   Procedure: LEFT TOTAL HIP REVISION;  Surgeon: Gearlean Alf, MD;  [facility];  Service: Orthopedics;  Laterality: Left;    Current Outpatient Medications  Medication Sig Dispense Refill  . acetaminophen (TYLENOL) 500 MG tablet Take 500 mg by mouth every 6 (six) hours as needed for mild pain or moderate pain.    . Coenzyme Q10 (COQ-10 PO) Take 1 tablet by mouth daily.    Marland Kitchen diltiazem (CARDIZEM CD) 120 MG 24 hr capsule Take 1 capsule (120 mg total) by mouth daily. 30 capsule 3  . ELIQUIS 2.5 MG TABS tablet TAKE 1 TABLET(2.5 MG) BY MOUTH TWICE DAILY 180 tablet 1  . levothyroxine (SYNTHROID) 112 MCG tablet Take 1 tablet (112 mcg total) by mouth daily. 90 tablet 0  . Multiple Vitamins-Iron (MULTIVITAMINS WITH IRON) TABS tablet Take 1 tablet by mouth daily.     . Omega-3 Fatty Acids (FISH OIL PO) Take 2 capsules by mouth.     . predniSONE (DELTASONE) 10 MG tablet Take 1 tablet (10 mg total) by mouth daily with breakfast. (Patient taking differently: Take 8 mg by mouth daily with breakfast.) 30 tablet 0  . Propylene Glycol (SYSTANE BALANCE OP) Apply 1 drop to eye in the morning and at bedtime.     . Vitamin D, Cholecalciferol, 1000 UNITS TABS Take 1,000 Units by mouth daily.     . flecainide (TAMBOCOR) 50 MG tablet Take 1 tablet (50 mg total) by mouth 2 (two) times daily. 60 tablet 3   No current facility-administered medications for this encounter.     Allergies  Allergen Reactions  . Lactose Intolerance (Gi)     Bloating, gas  . Epinephrine Other (See Comments)     Reaction:  Caused pt to scream uncontrollably.    Social History   Socioeconomic History  . Marital status: Single    Spouse name: Not on file  . Number of children: 1  . Years of education: Not on file  . Highest education level: Not on file  Occupational History  . Occupation: Retired  Tobacco Use  . Smoking status: Former Smoker    Packs/day: 1.00    Years: 10.00    Pack years:  10.00    Types: Cigarettes    Quit date: 05/09/1963    Years since quitting: 57.3  . Smokeless tobacco: Never Used  Vaping Use  . Vaping Use: Never used  Substance and Sexual Activity  . Alcohol use: Yes    Alcohol/week: 1.0 standard drink    Types: 1 Glasses of wine per week    Comment: very little  . Drug use: No  . Sexual activity: Never  Other Topics Concern  . Not on file  Social History Narrative   Lives alone.   Social Determinants of Health   Financial Resource Strain: Not on file  Food Insecurity: Not on file  Transportation Needs: Not on file  Physical Activity: Not on file  Stress: Not on file  Social Connections: Not on file  Intimate Partner Violence: Not on file     ROS- All systems are reviewed and negative except as per the HPI above.  Physical Exam: Vitals:   08/26/20 1525  BP: (!) 142/68  Pulse: 71  Weight: 55.2 kg  Height: 5\' 5"  (1.651 m)    GEN- The patient is a well appearing elderly female, alert and oriented x 3 today.   Head- normocephalic, atraumatic Eyes-  Sclera clear, conjunctiva pink Ears- hearing intact Oropharynx- clear Neck- supple  Lungs- Clear to ausculation bilaterally, normal work of breathing Heart- Regular rate and rhythm, no murmurs, rubs or gallops  GI- soft, NT, ND, + BS Extremities- no clubbing, cyanosis, or edema MS- no significant deformity or atrophy Skin- no rash or lesion Psych- euthymic mood, full  affect Neuro- strength and sensation are intact  Wt Readings from Last 3 Encounters:  08/26/20 55.2 kg  06/29/20 55.8 kg  02/26/20 57.1 kg    EKG today demonstrates  SR, LVH Vent. rate 71 BPM PR interval 136 ms QRS duration 86 ms QT/QTcB 392/425 ms  Echo 07/29/20 demonstrated  1. Left ventricular ejection fraction, by estimation, is 60 to 65%. The  left ventricle has normal function. The left ventricle has no regional  wall motion abnormalities. The average left ventricular global  longitudinal strain is -17.1 % (appears  underestimated). The global longitudinal strain is probably low normal.  Left ventricular diastolic parameters are consistent with Grade II  diastolic dysfunction (pseudonormalization). Elevated left atrial  pressure.  2. Right ventricular systolic function is normal. The right ventricular  size is normal. There is mildly elevated pulmonary artery systolic  pressure. The estimated right ventricular systolic pressure is 18.5 mmHg.  3. Left atrial size was moderately dilated.  4. The mitral valve is myxomatous. Mild mitral valve regurgitation. No  evidence of mitral stenosis.  5. The aortic valve is tricuspid. Aortic valve regurgitation is mild. No  aortic stenosis is present.  6. The inferior vena cava is dilated in size with >50% respiratory  variability, suggesting right atrial pressure of 8 mmHg.  7. Tricuspid valve regurgitation is mild to moderate.  8. Right atrial size was mildly dilated.   Comparison(s): Prior images unable to be directly viewed, comparison made  by report only. 03/10/14 EF 60-65%. PA pressure 63mmHg.  Epic records are reviewed at length today  CHA2DS2-VASc Score = 6  The patient's score is based upon: CHF History: No HTN History: Yes Diabetes History: No Stroke History: Yes Vascular Disease History: No Age Score: 2 Gender Score: 1      ASSESSMENT AND PLAN: 1. Paroxysmal Atrial Fibrillation (ICD10:  I48.0) The  patient's CHA2DS2-VASc score is 6, indicating a 9.7% annual  risk of stroke.   Patient is having increasing frequency of afib episodes. We discussed therapeutic options today including a lower maintenance dose of flecainide vs prn diltiazem vs dofetilide.  Patient would like to avoid a hospitalization if possible.  Will stop amlodipine and start diltiazem 120 mg daily. She has a BP machine and will watch her heart rates closely over the weekend. If she tolerates this without significant bradycardia, will then start flecainide 50 mg BID.  Continue Eliquis 2.5 mg BID  2. Secondary Hypercoagulable State (ICD10:  D68.69) The patient is at significant risk for stroke/thromboembolism based upon her CHA2DS2-VASc Score of 6.  Continue Apixaban (Eliquis).   3. HTN Stable, no changes today.  4. Post termination pauses Patient has declined PPM.   Follow up in the AF clinic in one week.    [facility] [address] [place], [place] [address] [phone] 08/26/2020 4:08 PM

## 2020-08-26 NOTE — Telephone Encounter (Signed)
Pt was asked the questions

## 2020-08-26 NOTE — Patient Instructions (Signed)
Stop amlodipine  Start Cardizem (Diltiazem) 120mg  once a day --- call Eden Rho on Monday with update of how you tolerated.    I've sent to Flecainide 50mg  twice a day - do not start until after we speak on Monday.

## 2020-08-27 NOTE — Telephone Encounter (Signed)
Pt has been scheduled to see Afib clinic 09/02/2020.

## 2020-08-29 ENCOUNTER — Encounter: Payer: Self-pay | Admitting: Family Medicine

## 2020-08-29 DIAGNOSIS — E039 Hypothyroidism, unspecified: Secondary | ICD-10-CM

## 2020-08-30 MED ORDER — LEVOTHYROXINE SODIUM 112 MCG PO TABS: 112.0000 ug | ORAL_TABLET | Freq: Every day | ORAL | 0 refills | Status: DC

## 2020-09-01 NOTE — Progress Notes (Addendum)
Primary Care Physician: Avanell Shackleton, NP-C Primary Electrophysiologist: Dr Graciela Husbands Referring Physician: Dr Wendie Simmer is a 87 y.o. female with a history of prior CVA, HTN, hypothyroidism, atrial fibrillation who presents for follow up in the [facility]. The patient was initially diagnosed with atrial fibrillation on an ILR after a cryptogenic stroke. Patient is on Eliquis for a CHADS2VASC score of 6. She was recently started on PRN flecainide which she took on 08/14/20. This did help her restore SR but she also had a fainting spell. She does have a h/o post termination pauses.   On follow up today, patient reports she has done reasonably well since her last visit. She denies dizziness or presyncope. She states her heart rates are in the 50s at home which is her baseline. She has not had any afib symptoms. She denies any bleeding issues on anticoagulation.   Today, she denies symptoms of palpitations, chest pain, shortness of breath, orthopnea, PND, lower extremity edema, dizziness, presyncope, syncope, snoring, daytime somnolence, bleeding, or neurologic sequela. The patient is tolerating medications without difficulties and is otherwise without complaint today.    Atrial Fibrillation Risk Factors:  she does not have symptoms or diagnosis of sleep apnea. she does not have a history of rheumatic fever.   she has a BMI of Body mass index is 20.47 kg/m.Marland Kitchen Filed Weights   09/02/20 0959  Weight: 55.8 kg    Family History  Problem Relation Age of Onset  . Hodgkin's lymphoma Mother   . Celiac disease Mother   . Cancer Mother   . Melanoma Father   . Cancer Father   . Cancer Brother   . Arthritis Sister   . Ulcerative colitis Sister   . Heart disease Paternal Grandmother   . Heart disease Paternal Grandfather      Atrial Fibrillation Management history:  Previous antiarrhythmic drugs: flecainide  Previous cardioversions: none Previous  ablations: none CHADS2VASC score: 6 Anticoagulation history: Eliquis   Past Medical History:  Diagnosis Date  . Anemia    hx of with surgery   . Arthritis 07-27-11   osteoarthritis-hips/s/p bil. THA, arthritis right hand   . Blepharitis of left eye   . Blood transfusion feb 2014   post surgery some time ago  . Cancer (HCC) 1989   Breast cancer -s/p lt. mastectomy, some squamous cell lesions  . Cold hands   . Complication of anesthesia    epinephrine - screams uncontrollably / pt does not want general anesthesia or narcotics  . Constipation   . Fuchs' corneal dystrophy    both eyes  . Hard of hearing    both ears mild loss  . Hyperkalemia 03/07/2018  . Hypertension   . Hypothyroidism 3-21- 13   tx. levothyroxine  . Lichen sclerosus et atrophicus of the vulva    pt reported.   . Osteopenia   . Stroke [facility])    a. s/p MDT LINQ 04/2014   Past Surgical History:  Procedure Laterality Date  . ABDOMINAL HYSTERECTOMY     ovaries removed  . ANKLE SURGERY  2005   right ankle tendon repair  . basal cell removed from face     3-4 times  . BREAST SURGERY  07-1987   lt.breast cancer intraductal cancer, mastectomy  . CATARACT EXTRACTION, BILATERAL  few yrs ago   Bilateral  . HARDWARE REMOVAL  04/05/2012   Procedure: HARDWARE REMOVAL;  Surgeon: Loanne Drilling, MD;  Location:  [facility];  Service: Orthopedics;  Laterality: Right;  Removal of Hardware Right Femur   . HARDWARE REMOVAL Left 06/30/2013   Procedure: LEFT HIP HARDWARE REMOVAL OF CABLES;  Surgeon: Gearlean Alf, MD;  Location: [facility];  Service: Orthopedics;  Laterality: Left;  . HARDWARE REMOVAL Right 08/21/2013   Procedure: RIGHT HIP HARDWARE REMOVAL;  Surgeon: Gearlean Alf, MD;  Location: [facility];  Service: Orthopedics;  Laterality: Right;  . JOINT REPLACEMENT  07-27-11   Bilateral: Right THA - 04/2000, Left THA 06/2001  . left hip replacement Left feb 2003  . LOOP RECORDER IMPLANT  04-08-2014   MDT LINQ implanted by Dr  Lovena Le for cryptogenic stroke  . LOOP RECORDER REMOVAL N/A 04/26/2017   Procedure: LOOP RECORDER REMOVAL;  Surgeon: Evans Lance, MD;  Location: [facility];  Service: Cardiovascular;  Laterality: N/A;  . MASTECTOMY  1989   left - Pt states has no restrictions on use of L arm for BP or blood draw  . melanoma removed Left    x 1  . ORIF FEMUR FRACTURE  07/31/2011   Procedure: OPEN REDUCTION INTERNAL FIXATION (ORIF) DISTAL FEMUR FRACTURE;  Surgeon: Gearlean Alf, MD;  Location: [facility];  Service: Orthopedics;  Laterality: Right;  right femur  (c-arm)   . SQUAMOUS CELL CARCINOMA EXCISION  07-27-11   x2 left shoulder  . TOTAL HIP REVISION  04/10/2012   Procedure: TOTAL HIP REVISION;  Surgeon: Gearlean Alf, MD;  Location: [facility];  Service: Orthopedics;  Laterality: Right;  . TOTAL HIP REVISION Left 06/27/2012   Procedure: LEFT TOTAL HIP REVISION;  Surgeon: Gearlean Alf, MD;  Location: [facility];  Service: Orthopedics;  Laterality: Left;    Current Outpatient Medications  Medication Sig Dispense Refill  . acetaminophen (TYLENOL) 500 MG tablet Take 500 mg by mouth every 6 (six) hours as needed for mild pain or moderate pain.    . Coenzyme Q10 (COQ-10 PO) Take 1 tablet by mouth daily.    Marland Kitchen diltiazem (CARDIZEM CD) 120 MG 24 hr capsule Take 1 capsule (120 mg total) by mouth daily. 30 capsule 3  . ELIQUIS 2.5 MG TABS tablet TAKE 1 TABLET(2.5 MG) BY MOUTH TWICE DAILY 180 tablet 1  . flecainide (TAMBOCOR) 50 MG tablet Take 1 tablet (50 mg total) by mouth 2 (two) times daily. 60 tablet 3  . levothyroxine (SYNTHROID) 112 MCG tablet Take 1 tablet (112 mcg total) by mouth daily. 90 tablet 0  . Multiple Vitamins-Iron (MULTIVITAMINS WITH IRON) TABS tablet Take 1 tablet by mouth daily.     . Omega-3 Fatty Acids (FISH OIL PO) Take 2 capsules by mouth.     . predniSONE (DELTASONE) 10 MG tablet Take 1 tablet (10 mg total) by mouth daily with breakfast. (Patient taking differently: Take 8 mg by mouth  daily with breakfast.) 30 tablet 0  . Propylene Glycol (SYSTANE BALANCE OP) Apply 1 drop to eye in the morning and at bedtime.     . Vitamin D, Cholecalciferol, 1000 UNITS TABS Take 1,000 Units by mouth daily.      No current facility-administered medications for this encounter.    Allergies  Allergen Reactions  . Lactose Intolerance (Gi)     Bloating, gas  . Epinephrine Other (See Comments)     Reaction:  Caused pt to scream uncontrollably.    Social History   Socioeconomic History  . Marital status: Single    Spouse name: Not on file  .  Number of children: 1  . Years of education: Not on file  . Highest education level: Not on file  Occupational History  . Occupation: Retired  Tobacco Use  . Smoking status: Former Smoker    Packs/day: 1.00    Years: 10.00    Pack years: 10.00    Types: Cigarettes    Quit date: 05/09/1963    Years since quitting: 57.3  . Smokeless tobacco: Never Used  Vaping Use  . Vaping Use: Never used  Substance and Sexual Activity  . Alcohol use: Yes    Alcohol/week: 1.0 standard drink    Types: 1 Glasses of wine per week    Comment: very little  . Drug use: No  . Sexual activity: Never  Other Topics Concern  . Not on file  Social History Narrative   Lives alone.   Social Determinants of Health   Financial Resource Strain: Not on file  Food Insecurity: Not on file  Transportation Needs: Not on file  Physical Activity: Not on file  Stress: Not on file  Social Connections: Not on file  Intimate Partner Violence: Not on file     ROS- All systems are reviewed and negative except as per the HPI above.  Physical Exam: Vitals:   09/02/20 0959  BP: (!) 150/68  Pulse: (!) 45  Weight: 55.8 kg  Height: 5\' 5"  (1.651 m)    GEN- The patient is a well appearing elderly female, alert and oriented x 3 today.   HEENT-head normocephalic, atraumatic, sclera clear, conjunctiva pink, hearing intact, trachea midline. Lungs- Clear to ausculation  bilaterally, normal work of breathing Heart- Regular rate and rhythm, bradycardia, no murmurs, rubs or gallops  GI- soft, NT, ND, + BS Extremities- no clubbing, cyanosis, or edema MS- no significant deformity or atrophy Skin- no rash or lesion Psych- euthymic mood, full affect Neuro- strength and sensation are intact   Wt Readings from Last 3 Encounters:  09/02/20 55.8 kg  08/26/20 55.2 kg  06/29/20 55.8 kg    EKG today demonstrates  SB Vent. rate 45 BPM PR interval 172 ms QRS duration 86 ms QT/QTcB 460/397 ms  Echo 07/29/20 demonstrated  1. Left ventricular ejection fraction, by estimation, is 60 to 65%. The  left ventricle has normal function. The left ventricle has no regional  wall motion abnormalities. The average left ventricular global  longitudinal strain is -17.1 % (appears  underestimated). The global longitudinal strain is probably low normal.  Left ventricular diastolic parameters are consistent with Grade II  diastolic dysfunction (pseudonormalization). Elevated left atrial  pressure.  2. Right ventricular systolic function is normal. The right ventricular  size is normal. There is mildly elevated pulmonary artery systolic  pressure. The estimated right ventricular systolic pressure is 48.5 mmHg.  3. Left atrial size was moderately dilated.  4. The mitral valve is myxomatous. Mild mitral valve regurgitation. No  evidence of mitral stenosis.  5. The aortic valve is tricuspid. Aortic valve regurgitation is mild. No  aortic stenosis is present.  6. The inferior vena cava is dilated in size with >50% respiratory  variability, suggesting right atrial pressure of 8 mmHg.  7. Tricuspid valve regurgitation is mild to moderate.  8. Right atrial size was mildly dilated.   Comparison(s): Prior images unable to be directly viewed, comparison made  by report only. 03/10/14 EF 60-65%. PA pressure 9mmHg.  Epic records are reviewed at length today  CHA2DS2-VASc  Score = 6  The patient's score is based upon:  CHF History: No HTN History: Yes Diabetes History: No Stroke History: Yes Vascular Disease History: No Age Score: 2 Gender Score: 1      ASSESSMENT AND PLAN: 1. Paroxysmal Atrial Fibrillation (ICD10:  I48.0) The patient's CHA2DS2-VASc score is 6, indicating a 9.7% annual risk of stroke.   Patient appears to be maintaining SR. If flecainide fails or she has significant issues with bradycardia, could consider dofetilide. Patient to monitor HR with home BP machine.  Continue flecainide 50 mg BID Continue diltiazem 120 mg daily Continue Eliquis 2.5 mg BID  2. Secondary Hypercoagulable State (ICD10:  D68.69) The patient is at significant risk for stroke/thromboembolism based upon her CHA2DS2-VASc Score of 6.  Continue Apixaban (Eliquis).   3. HTN Stable, no changes today.  4. Post termination pauses Patient has declined PPM.  5. Bradycardia Asymptomatic.   Follow up with Dr Caryl Comes as scheduled.    [facility] [address] [place], [place] [address] [phone] 09/02/2020 10:02 AM

## 2020-09-02 ENCOUNTER — Other Ambulatory Visit: Payer: Self-pay

## 2020-09-02 ENCOUNTER — Ambulatory Visit (HOSPITAL_COMMUNITY)
Admission: RE | Admit: 2020-09-02 | Discharge: 2020-09-02 | Disposition: A | Discharge status: Home / Self Care | Payer: Medicare PPO | Source: Ambulatory Visit | Attending: Physician Assistant | Admitting: Physician Assistant

## 2020-09-02 ENCOUNTER — Encounter (HOSPITAL_COMMUNITY): Payer: Self-pay | Admitting: Physician Assistant

## 2020-09-02 VITALS — BP 150/68 | HR 45 | Ht 65.0 in | Wt 123.0 lb

## 2020-09-02 DIAGNOSIS — Z79899 Other long term (current) drug therapy: Secondary | ICD-10-CM | POA: Insufficient documentation

## 2020-09-02 DIAGNOSIS — D6869 Other thrombophilia: Secondary | ICD-10-CM | POA: Diagnosis not present

## 2020-09-02 DIAGNOSIS — Z7901 Long term (current) use of anticoagulants: Secondary | ICD-10-CM | POA: Insufficient documentation

## 2020-09-02 DIAGNOSIS — Z87891 Personal history of nicotine dependence: Secondary | ICD-10-CM | POA: Diagnosis not present

## 2020-09-02 DIAGNOSIS — R001 Bradycardia, unspecified: Secondary | ICD-10-CM | POA: Insufficient documentation

## 2020-09-02 DIAGNOSIS — I10 Essential (primary) hypertension: Secondary | ICD-10-CM | POA: Diagnosis not present

## 2020-09-02 DIAGNOSIS — I480 Paroxysmal atrial fibrillation: Secondary | ICD-10-CM | POA: Diagnosis not present

## 2020-09-06 ENCOUNTER — Telehealth (HOSPITAL_COMMUNITY): Payer: Self-pay | Admitting: *Deleted

## 2020-09-06 MED ORDER — DILTIAZEM HCL 30 MG PO TABS: ORAL_TABLET | ORAL | 1 refills | Status: DC

## 2020-09-06 MED ORDER — AMLODIPINE BESYLATE 5 MG PO TABS: 5.0000 mg | ORAL_TABLET | Freq: Every day | ORAL | 11 refills | Status: DC

## 2020-09-06 NOTE — Telephone Encounter (Signed)
Patient called in stating she is not tolerating the Diltiazem 120mg  a day and wishes to stop the daily medication.  Discussed with Adline Peals PA will stop daily diltiazem and flecainide.  Pt will use PRN cardizem 30mg  for breakthrough afib. Pt originally wanted to proceed with dofetilide admission then decided she would prefer to try the PRN cardizem for her afib episodes since they are infrequent if episodes increase she may reconsider at that point.  Pt will call if issues arise.

## 2020-09-07 ENCOUNTER — Other Ambulatory Visit: Payer: Self-pay | Admitting: Family Medicine

## 2020-09-07 NOTE — Telephone Encounter (Signed)
This needs to come from cardiology

## 2020-09-08 ENCOUNTER — Other Ambulatory Visit (HOSPITAL_COMMUNITY): Payer: Self-pay | Admitting: *Deleted

## 2020-09-08 ENCOUNTER — Other Ambulatory Visit: Payer: Self-pay | Admitting: Family Medicine

## 2020-09-08 MED ORDER — AMLODIPINE BESYLATE 5 MG PO TABS: 5.0000 mg | ORAL_TABLET | Freq: Every day | ORAL | 6 refills | Status: DC

## 2020-09-24 ENCOUNTER — Telehealth (HOSPITAL_COMMUNITY): Payer: Self-pay | Admitting: Physician Assistant

## 2020-09-24 NOTE — Telephone Encounter (Signed)
Patient calls reporting that she woke with heart racing this morning with heart rates around 120 bpm. She took a PRN diltiazem 30 mg and her heart rate is now in the 80s, BP 129/80. She feels well. Advised her NOT to take PRN flecainide given her syncopal event last time she took a PRN dose. She is now interested in dofetilide admission. Will make arrangements.

## 2020-09-25 ENCOUNTER — Other Ambulatory Visit: Payer: Self-pay | Admitting: Family Medicine

## 2020-09-29 ENCOUNTER — Telehealth (HOSPITAL_COMMUNITY): Payer: Self-pay | Admitting: *Deleted

## 2020-09-29 MED ORDER — AMLODIPINE BESYLATE 5 MG PO TABS: 7.5000 mg | ORAL_TABLET | Freq: Every day | ORAL | 6 refills | Status: DC

## 2020-09-29 NOTE — Telephone Encounter (Signed)
Patient called in today stating her BP since yesterday has been elevated in the 201-212/93-95 range. Denies change in diet or medications.  Discussed with Adline Peals PA will increase amlodipine to 7.5mg  once a day follow up with readings on Friday. Pt in agreement.

## 2020-09-30 ENCOUNTER — Encounter: Payer: Self-pay | Admitting: Family Medicine

## 2020-10-07 ENCOUNTER — Telehealth: Payer: Medicare PPO | Admitting: Family Medicine

## 2020-10-07 ENCOUNTER — Other Ambulatory Visit: Payer: Self-pay

## 2020-10-07 ENCOUNTER — Encounter: Payer: Self-pay | Admitting: Family Medicine

## 2020-10-07 VITALS — BP 122/67 | HR 64 | Wt 125.0 lb

## 2020-10-07 DIAGNOSIS — I4891 Unspecified atrial fibrillation: Secondary | ICD-10-CM | POA: Diagnosis not present

## 2020-10-07 DIAGNOSIS — Z6820 Body mass index (BMI) 20.0-20.9, adult: Secondary | ICD-10-CM | POA: Diagnosis not present

## 2020-10-07 DIAGNOSIS — I10 Essential (primary) hypertension: Secondary | ICD-10-CM

## 2020-10-07 DIAGNOSIS — Z8673 Personal history of transient ischemic attack (TIA), and cerebral infarction without residual deficits: Secondary | ICD-10-CM | POA: Diagnosis not present

## 2020-10-07 DIAGNOSIS — M2550 Pain in unspecified joint: Secondary | ICD-10-CM | POA: Diagnosis not present

## 2020-10-07 DIAGNOSIS — M353 Polymyalgia rheumatica: Secondary | ICD-10-CM | POA: Diagnosis not present

## 2020-10-07 DIAGNOSIS — F418 Other specified anxiety disorders: Secondary | ICD-10-CM

## 2020-10-07 DIAGNOSIS — F410 Panic disorder [episodic paroxysmal anxiety] without agoraphobia: Secondary | ICD-10-CM

## 2020-10-07 MED ORDER — ALPRAZOLAM 0.25 MG PO TABS: ORAL_TABLET | ORAL | 0 refills | Status: DC

## 2020-10-07 NOTE — Progress Notes (Signed)
   Subjective:  Documentation for virtual audio and video telecommunications through [place] encounter:  The patient was located at home. 2 patient identifiers used.  The provider was located in the office. The patient did consent to this visit and is aware of possible charges through their insurance for this visit.  The other persons participating in this telemedicine service were none. Time spent on call was 16 minutes and in review of previous records 20 minutes total.  This virtual service is not related to other E/M service within previous 7 days.   Patient ID: [name], female    DOB: 1931-05-21, 87 y.o.   MRN: [id number]  HPI Chief Complaint  Patient presents with  . aniexty     Discuss aniexty. Wants to something for when she has an episode and not everyday medication   Complains of anxiety and attacks since 2015, only 2 episodes that coincide with major life events. States her first anxiety attack led to her stroke due her BP being so elevated.  States she recently had another issue where she thought she misplaced her debit card and again, her BP and pulse were elevated which was frightening.  States she would like to have medication to take if another situation like this occurred but not to take daily. Overall her mood and thought process is quite good.   States she is flying to [place] and then onto [place] soon. Concerned that she could have issues with her travel that may be upsetting.   No history of addiction. No alcohol use.   Denies fever, chills, dizziness, chest pain, shortness of breath, abdominal pain, N/V/D.   Reviewed allergies, medications, past medical, surgical, family, and social history.     Review of Systems Pertinent positives and negatives in the history of present illness.     Objective:   Physical Exam BP 122/67   Pulse 64   Wt 125 lb (56.7 kg)   BMI 20.80 kg/m   Alert and oriented and in no acute distress.  Normal facial movements.   Respirations unlabored.  She is speaking in complete sentences without difficulty.  Normal speech, mood and memory.      Assessment & Plan:  Anxiety attack - Plan: ALPRAZolam (XANAX) 0.25 MG tablet  Situational anxiety  Atrial fibrillation, unspecified type (HCC)  Essential hypertension  History of CVA (cerebrovascular accident) without residual deficits  We discussed treatment options for episodic anxiety.  I will prescribe alprazolam for her to take as needed.  In-depth conversation regarding potential side effects to this medication.  Discussed that this will be more of a safety net for her and that she may not actually need the medication at all.  She is pleased with this plan of care.  Follow-up as scheduled or sooner if needed.

## 2020-10-14 ENCOUNTER — Other Ambulatory Visit: Payer: Self-pay

## 2020-10-15 ENCOUNTER — Other Ambulatory Visit: Payer: Self-pay

## 2020-10-15 ENCOUNTER — Other Ambulatory Visit (HOSPITAL_COMMUNITY)
Admission: RE | Admit: 2020-10-15 | Discharge: 2020-10-15 | Disposition: A | Discharge status: Home / Self Care | Payer: Medicare PPO | Source: Ambulatory Visit | Attending: Physician Assistant | Admitting: Physician Assistant

## 2020-10-15 DIAGNOSIS — H9190 Unspecified hearing loss, unspecified ear: Secondary | ICD-10-CM | POA: Diagnosis present

## 2020-10-15 DIAGNOSIS — Z01812 Encounter for preprocedural laboratory examination: Secondary | ICD-10-CM | POA: Insufficient documentation

## 2020-10-15 DIAGNOSIS — Z20822 Contact with and (suspected) exposure to covid-19: Secondary | ICD-10-CM | POA: Diagnosis present

## 2020-10-15 DIAGNOSIS — Z79890 Hormone replacement therapy (postmenopausal): Secondary | ICD-10-CM | POA: Diagnosis not present

## 2020-10-15 DIAGNOSIS — Z7901 Long term (current) use of anticoagulants: Secondary | ICD-10-CM | POA: Diagnosis not present

## 2020-10-15 DIAGNOSIS — Z8261 Family history of arthritis: Secondary | ICD-10-CM | POA: Diagnosis not present

## 2020-10-15 DIAGNOSIS — R9431 Abnormal electrocardiogram [ECG] [EKG]: Secondary | ICD-10-CM | POA: Diagnosis not present

## 2020-10-15 DIAGNOSIS — Z90710 Acquired absence of both cervix and uterus: Secondary | ICD-10-CM | POA: Diagnosis not present

## 2020-10-15 DIAGNOSIS — Z807 Family history of other malignant neoplasms of lymphoid, hematopoietic and related tissues: Secondary | ICD-10-CM | POA: Diagnosis not present

## 2020-10-15 DIAGNOSIS — Z9012 Acquired absence of left breast and nipple: Secondary | ICD-10-CM | POA: Diagnosis not present

## 2020-10-15 DIAGNOSIS — Z8673 Personal history of transient ischemic attack (TIA), and cerebral infarction without residual deficits: Secondary | ICD-10-CM | POA: Diagnosis not present

## 2020-10-15 DIAGNOSIS — I480 Paroxysmal atrial fibrillation: Secondary | ICD-10-CM | POA: Diagnosis present

## 2020-10-15 DIAGNOSIS — Z8379 Family history of other diseases of the digestive system: Secondary | ICD-10-CM | POA: Diagnosis not present

## 2020-10-15 DIAGNOSIS — M8580 Other specified disorders of bone density and structure, unspecified site: Secondary | ICD-10-CM | POA: Diagnosis present

## 2020-10-15 DIAGNOSIS — E039 Hypothyroidism, unspecified: Secondary | ICD-10-CM | POA: Diagnosis present

## 2020-10-15 DIAGNOSIS — E738 Other lactose intolerance: Secondary | ICD-10-CM | POA: Diagnosis present

## 2020-10-15 DIAGNOSIS — I10 Essential (primary) hypertension: Secondary | ICD-10-CM | POA: Diagnosis present

## 2020-10-15 DIAGNOSIS — Z8249 Family history of ischemic heart disease and other diseases of the circulatory system: Secondary | ICD-10-CM | POA: Diagnosis not present

## 2020-10-15 DIAGNOSIS — Z7952 Long term (current) use of systemic steroids: Secondary | ICD-10-CM | POA: Diagnosis not present

## 2020-10-15 DIAGNOSIS — Z888 Allergy status to other drugs, medicaments and biological substances status: Secondary | ICD-10-CM | POA: Diagnosis not present

## 2020-10-15 DIAGNOSIS — Z808 Family history of malignant neoplasm of other organs or systems: Secondary | ICD-10-CM | POA: Diagnosis not present

## 2020-10-15 DIAGNOSIS — Z853 Personal history of malignant neoplasm of breast: Secondary | ICD-10-CM | POA: Diagnosis not present

## 2020-10-15 DIAGNOSIS — N289 Disorder of kidney and ureter, unspecified: Secondary | ICD-10-CM | POA: Diagnosis present

## 2020-10-15 DIAGNOSIS — Z79899 Other long term (current) drug therapy: Secondary | ICD-10-CM | POA: Diagnosis not present

## 2020-10-15 DIAGNOSIS — D6869 Other thrombophilia: Secondary | ICD-10-CM | POA: Diagnosis present

## 2020-10-15 DIAGNOSIS — Z96642 Presence of left artificial hip joint: Secondary | ICD-10-CM | POA: Diagnosis present

## 2020-10-15 LAB — SARS CORONAVIRUS 2 (TAT 6-24 HRS): SARS Coronavirus 2: NEGATIVE

## 2020-10-18 ENCOUNTER — Inpatient Hospital Stay (HOSPITAL_COMMUNITY)
Admission: RE | Admit: 2020-10-18 | Discharge: 2020-10-21 | DRG: 309 | Disposition: A | Discharge status: Home / Self Care | Payer: Medicare PPO | Source: Ambulatory Visit | Attending: Internal Medicine | Admitting: Internal Medicine

## 2020-10-18 ENCOUNTER — Telehealth: Payer: Self-pay | Admitting: Pharmacist

## 2020-10-18 ENCOUNTER — Other Ambulatory Visit: Payer: Self-pay

## 2020-10-18 ENCOUNTER — Ambulatory Visit (HOSPITAL_COMMUNITY)
Admission: RE | Admit: 2020-10-18 | Discharge: 2020-10-18 | Disposition: A | Discharge status: Home / Self Care | Payer: Medicare PPO | Source: Ambulatory Visit | Attending: Physician Assistant | Admitting: Physician Assistant

## 2020-10-18 VITALS — BP 154/74 | HR 76 | Ht 65.0 in | Wt 119.4 lb

## 2020-10-18 DIAGNOSIS — Z853 Personal history of malignant neoplasm of breast: Secondary | ICD-10-CM | POA: Diagnosis not present

## 2020-10-18 DIAGNOSIS — N289 Disorder of kidney and ureter, unspecified: Secondary | ICD-10-CM | POA: Diagnosis not present

## 2020-10-18 DIAGNOSIS — H9190 Unspecified hearing loss, unspecified ear: Secondary | ICD-10-CM | POA: Diagnosis present

## 2020-10-18 DIAGNOSIS — Z8261 Family history of arthritis: Secondary | ICD-10-CM | POA: Diagnosis not present

## 2020-10-18 DIAGNOSIS — I480 Paroxysmal atrial fibrillation: Secondary | ICD-10-CM

## 2020-10-18 DIAGNOSIS — Z808 Family history of malignant neoplasm of other organs or systems: Secondary | ICD-10-CM | POA: Diagnosis not present

## 2020-10-18 DIAGNOSIS — D6869 Other thrombophilia: Secondary | ICD-10-CM | POA: Diagnosis not present

## 2020-10-18 DIAGNOSIS — Z90710 Acquired absence of both cervix and uterus: Secondary | ICD-10-CM | POA: Diagnosis not present

## 2020-10-18 DIAGNOSIS — Z9012 Acquired absence of left breast and nipple: Secondary | ICD-10-CM | POA: Diagnosis not present

## 2020-10-18 DIAGNOSIS — E039 Hypothyroidism, unspecified: Secondary | ICD-10-CM | POA: Diagnosis not present

## 2020-10-18 DIAGNOSIS — Z96642 Presence of left artificial hip joint: Secondary | ICD-10-CM | POA: Diagnosis present

## 2020-10-18 DIAGNOSIS — I10 Essential (primary) hypertension: Secondary | ICD-10-CM | POA: Diagnosis present

## 2020-10-18 DIAGNOSIS — E738 Other lactose intolerance: Secondary | ICD-10-CM | POA: Diagnosis present

## 2020-10-18 DIAGNOSIS — Z7952 Long term (current) use of systemic steroids: Secondary | ICD-10-CM

## 2020-10-18 DIAGNOSIS — F17210 Nicotine dependence, cigarettes, uncomplicated: Secondary | ICD-10-CM | POA: Diagnosis present

## 2020-10-18 DIAGNOSIS — M8580 Other specified disorders of bone density and structure, unspecified site: Secondary | ICD-10-CM | POA: Diagnosis present

## 2020-10-18 DIAGNOSIS — Z8379 Family history of other diseases of the digestive system: Secondary | ICD-10-CM

## 2020-10-18 DIAGNOSIS — Z8249 Family history of ischemic heart disease and other diseases of the circulatory system: Secondary | ICD-10-CM

## 2020-10-18 DIAGNOSIS — Z20822 Contact with and (suspected) exposure to covid-19: Secondary | ICD-10-CM | POA: Diagnosis present

## 2020-10-18 DIAGNOSIS — Z7901 Long term (current) use of anticoagulants: Secondary | ICD-10-CM

## 2020-10-18 DIAGNOSIS — Z8673 Personal history of transient ischemic attack (TIA), and cerebral infarction without residual deficits: Secondary | ICD-10-CM | POA: Diagnosis not present

## 2020-10-18 DIAGNOSIS — Z807 Family history of other malignant neoplasms of lymphoid, hematopoietic and related tissues: Secondary | ICD-10-CM | POA: Diagnosis not present

## 2020-10-18 DIAGNOSIS — Z79890 Hormone replacement therapy (postmenopausal): Secondary | ICD-10-CM | POA: Diagnosis not present

## 2020-10-18 DIAGNOSIS — Z79899 Other long term (current) drug therapy: Secondary | ICD-10-CM | POA: Diagnosis not present

## 2020-10-18 DIAGNOSIS — M19041 Primary osteoarthritis, right hand: Secondary | ICD-10-CM | POA: Diagnosis present

## 2020-10-18 DIAGNOSIS — Z888 Allergy status to other drugs, medicaments and biological substances status: Secondary | ICD-10-CM | POA: Diagnosis not present

## 2020-10-18 DIAGNOSIS — R9431 Abnormal electrocardiogram [ECG] [EKG]: Secondary | ICD-10-CM | POA: Diagnosis not present

## 2020-10-18 LAB — BASIC METABOLIC PANEL
Anion gap: 8 (ref 5–15)
BUN: 15 mg/dL (ref 8–23)
CO2: 28 mmol/L (ref 22–32)
Calcium: 9.5 mg/dL (ref 8.9–10.3)
Chloride: 100 mmol/L (ref 98–111)
Creatinine, Ser: 0.76 mg/dL (ref 0.44–1.00)
GFR, Estimated: >60 mL/min (ref >60)
Glucose, Bld: 175 mg/dL — ABNORMAL HIGH (ref 70–99)
Potassium: 4.5 mmol/L (ref 3.5–5.1)
Sodium: 136 mmol/L (ref 135–145)

## 2020-10-18 LAB — MAGNESIUM: Magnesium: 1.9 mg/dL (ref 1.7–2.4)

## 2020-10-18 MED ORDER — LEVOTHYROXINE SODIUM 112 MCG PO TABS
112.0000 ug | ORAL_TABLET | Freq: Every day | ORAL | Status: DC
  Administered 2020-10-19 – 2020-10-21 (×3): 112 ug via ORAL
  Filled 2020-10-18 (×3): qty 1

## 2020-10-18 MED ORDER — PREDNISONE 5 MG PO TABS
6.0000 mg | ORAL_TABLET | Freq: Every day | ORAL | Status: DC
  Administered 2020-10-19 – 2020-10-21 (×3): 6 mg via ORAL
  Filled 2020-10-18 (×3): qty 1

## 2020-10-18 MED ORDER — APIXABAN 2.5 MG PO TABS
2.5000 mg | ORAL_TABLET | Freq: Two times a day (BID) | ORAL | Status: DC
  Administered 2020-10-18 – 2020-10-21 (×6): 2.5 mg via ORAL
  Filled 2020-10-18 (×6): qty 1

## 2020-10-18 MED ORDER — ADULT MULTIVITAMIN W/MINERALS CH
1.0000 | ORAL_TABLET | Freq: Every day | ORAL | Status: DC
  Administered 2020-10-19 – 2020-10-21 (×3): 1 via ORAL
  Filled 2020-10-18 (×3): qty 1

## 2020-10-18 MED ORDER — ACETAMINOPHEN 500 MG PO TABS
1000.0000 mg | ORAL_TABLET | Freq: Every day | ORAL | Status: DC
  Administered 2020-10-19 – 2020-10-20 (×2): 1000 mg via ORAL
  Filled 2020-10-18 (×2): qty 2

## 2020-10-18 MED ORDER — MAGNESIUM SULFATE 2 GM/50ML IV SOLN
2.0000 g | Freq: Once | INTRAVENOUS | Status: AC
  Administered 2020-10-18: 2 g via INTRAVENOUS
  Filled 2020-10-18: qty 50

## 2020-10-18 MED ORDER — ALPRAZOLAM 0.25 MG PO TABS: 0.2500 mg | ORAL_TABLET | Freq: Every day | ORAL | Status: DC | PRN

## 2020-10-18 MED ORDER — POLYVINYL ALCOHOL 1.4 % OP SOLN
1.0000 [drp] | Freq: Every day | OPHTHALMIC | Status: DC
  Administered 2020-10-18 – 2020-10-19 (×2): 1 [drp] via OPHTHALMIC
  Filled 2020-10-18: qty 15

## 2020-10-18 MED ORDER — OMEGA-3-ACID ETHYL ESTERS 1 G PO CAPS
1.0000 g | ORAL_CAPSULE | Freq: Every day | ORAL | Status: DC
  Administered 2020-10-18 – 2020-10-21 (×4): 1 g via ORAL
  Filled 2020-10-18 (×4): qty 1

## 2020-10-18 MED ORDER — SODIUM CHLORIDE 0.9% FLUSH: 3.0000 mL | INTRAVENOUS | Status: DC | PRN

## 2020-10-18 MED ORDER — TAB-A-VITE/IRON PO TABS: 1.0000 | ORAL_TABLET | Freq: Every day | ORAL | Status: DC

## 2020-10-18 MED ORDER — PROPYLENE GLYCOL 0.6 % OP SOLN: Freq: Every day | OPHTHALMIC | Status: DC

## 2020-10-18 MED ORDER — ACETAMINOPHEN 500 MG PO TABS: 500.0000 mg | ORAL_TABLET | Freq: Two times a day (BID) | ORAL | Status: DC | PRN

## 2020-10-18 MED ORDER — SODIUM CHLORIDE 0.9 % IV SOLN: 250.0000 mL | INTRAVENOUS | Status: DC | PRN

## 2020-10-18 MED ORDER — DOFETILIDE 250 MCG PO CAPS
250.0000 ug | ORAL_CAPSULE | Freq: Two times a day (BID) | ORAL | Status: DC
  Administered 2020-10-18 – 2020-10-21 (×6): 250 ug via ORAL
  Filled 2020-10-18 (×6): qty 1

## 2020-10-18 MED ORDER — SODIUM CHLORIDE 0.9% FLUSH
3.0000 mL | Freq: Two times a day (BID) | INTRAVENOUS | Status: DC
  Administered 2020-10-18 – 2020-10-21 (×6): 3 mL via INTRAVENOUS

## 2020-10-18 MED ORDER — ACETAMINOPHEN 500 MG PO TABS: 500.0000 mg | ORAL_TABLET | Freq: Four times a day (QID) | ORAL | Status: DC | PRN

## 2020-10-18 MED ORDER — VITAMIN D 25 MCG (1000 UNIT) PO TABS
1000.0000 [IU] | ORAL_TABLET | Freq: Every day | ORAL | Status: DC
  Administered 2020-10-19 – 2020-10-21 (×3): 1000 [IU] via ORAL
  Filled 2020-10-18 (×3): qty 1

## 2020-10-18 MED ORDER — AMLODIPINE BESYLATE 5 MG PO TABS
5.0000 mg | ORAL_TABLET | Freq: Every day | ORAL | Status: DC
  Administered 2020-10-19 – 2020-10-21 (×3): 5 mg via ORAL
  Filled 2020-10-18 (×3): qty 1

## 2020-10-18 MED ORDER — COQ-10 30 MG PO CAPS: ORAL_CAPSULE | Freq: Every day | ORAL | Status: DC

## 2020-10-18 MED ORDER — ACETAMINOPHEN 500 MG PO TABS: 1000.0000 mg | ORAL_TABLET | Freq: Every day | ORAL | Status: DC

## 2020-10-18 NOTE — H&P (Addendum)
See Afib clinic note for details  Pt seen and examined PE as noted previously  ECG demonstrates sinus rhythm 76 15/07/37 (QTc 418) ST seg depression   Assessment and plan:  Atrial fibrillation-paroxysmal  Syncope question mechanism  Hypertension without left ventricular hypertrophy  Cryptogenic stroke (remote ILR DOI 2015)  Renal insufficiency Estimated Creatinine Clearance: 42.5 mL/min (by C-G formula based on SCr of 0.76 mg/dL).  Posttermination pauses  ST segment depression variable   The patient is admitted for dofetilide.  Failed flecainide.  Amiodarone might be of some value posttermination pauses raise concerns, especially with her history of syncope.  I reviewed with her the risks and the benefits of dofetilide; we will begin at 250 mcg twice daily based on renal function.  Blood pressure has been elevated at home.  We will need to monitor.  Currently on 5 mg of amlodipine; previous blood pressures at home been over 200.  ECG suggestive of LVH with repolarization abnormality seen previously.  Surprisingly her echo does not show significant LVH.  No symptoms to suggest ischemia.  Will check troponin and undertake myoview while she is here

## 2020-10-18 NOTE — Progress Notes (Signed)
Primary Care Physician: Girtha Rm, NP-C Primary Electrophysiologist: Dr Caryl Comes Referring Physician: Dr Marcene Duos is a 87 y.o. female with a history of prior CVA, HTN, hypothyroidism, atrial fibrillation who presents for follow up in the [facility]. The patient was initially diagnosed with atrial fibrillation on an ILR after a cryptogenic stroke. Patient is on Eliquis for a CHADS2VASC score of 6. She was recently started on PRN flecainide which she took on 08/14/20. This did help her restore SR but she also had a fainting spell. She does have a h/o post termination pauses.   On follow up today, patient presents for dofetilide admission. She did not tolerate daily rate control or flecainide. She denies any missed doses of anticoagulation in the last 3 weeks.   Today, she denies symptoms of palpitations, chest pain, shortness of breath, orthopnea, PND, lower extremity edema, dizziness, presyncope, syncope, snoring, daytime somnolence, bleeding, or neurologic sequela. The patient is tolerating medications without difficulties and is otherwise without complaint today.    Atrial Fibrillation Risk Factors:  she does not have symptoms or diagnosis of sleep apnea. she does not have a history of rheumatic fever.   she has a BMI of Body mass index is 19.87 kg/m.Marland Kitchen Filed Weights   10/18/20 1131  Weight: 54.2 kg     Family History  Problem Relation Age of Onset   Hodgkin's lymphoma Mother    Celiac disease Mother    Cancer Mother    Melanoma Father    Cancer Father    Cancer Brother    Arthritis Sister    Ulcerative colitis Sister    Heart disease Paternal Grandmother    Heart disease Paternal Grandfather      Atrial Fibrillation Management history:  Previous antiarrhythmic drugs: flecainide  Previous cardioversions: none Previous ablations: none CHADS2VASC score: 6 Anticoagulation history: Eliquis   Past Medical History:   Diagnosis Date   Anemia    hx of with surgery    Arthritis 07-27-11   osteoarthritis-hips/s/p bil. THA, arthritis right hand    Blepharitis of left eye    Blood transfusion feb 2014   post surgery some time ago   Cancer [facility]) 1989   Breast cancer -s/p lt. mastectomy, some squamous cell lesions   Cold hands    Complication of anesthesia    epinephrine - screams uncontrollably / pt does not want general anesthesia or narcotics   Constipation    Fuchs' corneal dystrophy    both eyes   Hard of hearing    both ears mild loss   Hyperkalemia 03/07/2018   Hypertension    Hypothyroidism 3-21- 13   tx. levothyroxine   Lichen sclerosus et atrophicus of the vulva    pt reported.    Osteopenia    Stroke [facility])    a. s/p MDT LINQ 04/2014   Past Surgical History:  Procedure Laterality Date   ABDOMINAL HYSTERECTOMY     ovaries removed   ANKLE SURGERY  2005   right ankle tendon repair   basal cell removed from face     3-4 times   BREAST SURGERY  07-1987   lt.breast cancer intraductal cancer, mastectomy   CATARACT EXTRACTION, BILATERAL  few yrs ago   Bilateral   HARDWARE REMOVAL  04/05/2012   Procedure: HARDWARE REMOVAL;  Surgeon: Gearlean Alf, MD;  Location: [facility];  Service: Orthopedics;  Laterality: Right;  Removal of Hardware Right Femur    HARDWARE  REMOVAL Left 06/30/2013   Procedure: LEFT HIP HARDWARE REMOVAL OF CABLES;  Surgeon: Gearlean Alf, MD;  Location: [facility];  Service: Orthopedics;  Laterality: Left;   HARDWARE REMOVAL Right 08/21/2013   Procedure: RIGHT HIP HARDWARE REMOVAL;  Surgeon: Gearlean Alf, MD;  Location: [facility];  Service: Orthopedics;  Laterality: Right;   JOINT REPLACEMENT  07-27-11   Bilateral: Right THA - 04/2000, Left THA 06/2001   left hip replacement Left feb 2003   LOOP RECORDER IMPLANT  04-08-2014   MDT LINQ implanted by Dr Lovena Le for cryptogenic stroke   LOOP RECORDER REMOVAL N/A 04/26/2017   Procedure: LOOP RECORDER REMOVAL;  Surgeon: Evans Lance, MD;  Location: [place] [facility];  Service: Cardiovascular;  Laterality: N/A;   MASTECTOMY  1989   left - Pt states has no restrictions on use of L arm for BP or blood draw   melanoma removed Left    x 1   ORIF FEMUR FRACTURE  07/31/2011   Procedure: OPEN REDUCTION INTERNAL FIXATION (ORIF) DISTAL FEMUR FRACTURE;  Surgeon: Gearlean Alf, MD;  Location: [facility];  Service: Orthopedics;  Laterality: Right;  right femur  (c-arm)    SQUAMOUS CELL CARCINOMA EXCISION  07-27-11   x2 left shoulder   TOTAL HIP REVISION  04/10/2012   Procedure: TOTAL HIP REVISION;  Surgeon: Gearlean Alf, MD;  Location: [facility];  Service: Orthopedics;  Laterality: Right;   TOTAL HIP REVISION Left 06/27/2012   Procedure: LEFT TOTAL HIP REVISION;  Surgeon: Gearlean Alf, MD;  Location: [facility];  Service: Orthopedics;  Laterality: Left;    Current Outpatient Medications  Medication Sig Dispense Refill   acetaminophen (TYLENOL) 500 MG tablet Take 500 mg by mouth every 6 (six) hours as needed for mild pain or moderate pain.     ALPRAZolam (XANAX) 0.25 MG tablet Take 1/2 to 1 tablet once daily as needed for panic attacks. 6 tablet 0   amLODipine (NORVASC) 5 MG tablet Take 1.5 tablets (7.5 mg total) by mouth daily. (Patient taking differently: Take 5 mg by mouth daily.) 30 tablet 6   Coenzyme Q10 (COQ-10 PO) Take 1 tablet by mouth daily.     ELIQUIS 2.5 MG TABS tablet TAKE 1 TABLET(2.5 MG) BY MOUTH TWICE DAILY 180 tablet 1   levothyroxine (SYNTHROID) 112 MCG tablet Take 1 tablet (112 mcg total) by mouth daily. 90 tablet 0   Multiple Vitamins-Iron (MULTIVITAMINS WITH IRON) TABS tablet Take 1 tablet by mouth daily.      Omega-3 Fatty Acids (FISH OIL PO) Take 2 capsules by mouth.      predniSONE (DELTASONE) 10 MG tablet Take 1 tablet (10 mg total) by mouth daily with breakfast. (Patient taking differently: Take 6 mg by mouth daily with breakfast.) 30 tablet 0   Propylene Glycol (SYSTANE BALANCE OP) Apply 1 drop to eye  in the morning and at bedtime.      Vitamin D, Cholecalciferol, 1000 UNITS TABS Take 1,000 Units by mouth daily.      No current facility-administered medications for this encounter.    Allergies  Allergen Reactions   Lactose Intolerance (Gi)     Bloating, gas   Epinephrine Other (See Comments)     Reaction:  Caused pt to scream uncontrollably.    Social History   Socioeconomic History   Marital status: Single    Spouse name: Not on file   Number of children: 1   Years of education: Not on file  Highest education level: Not on file  Occupational History   Occupation: Retired  Tobacco Use   Smoking status: Former    Packs/day: 1.00    Years: 10.00    Pack years: 10.00    Types: Cigarettes    Quit date: 05/09/1963    Years since quitting: 57.4   Smokeless tobacco: Never  Vaping Use   Vaping Use: Never used  Substance and Sexual Activity   Alcohol use: Yes    Alcohol/week: 1.0 standard drink    Types: 1 Glasses of wine per week    Comment: very little   Drug use: No   Sexual activity: Never  Other Topics Concern   Not on file  Social History Narrative   Lives alone.   Social Determinants of Health   Financial Resource Strain: Not on file  Food Insecurity: Not on file  Transportation Needs: Not on file  Physical Activity: Not on file  Stress: Not on file  Social Connections: Not on file  Intimate Partner Violence: Not on file     ROS- All systems are reviewed and negative except as per the HPI above.  Physical Exam: Vitals:   10/18/20 1131  BP: (!) 154/74  Pulse: 76  Weight: 54.2 kg  Height: 5\' 5"  (1.651 m)     GEN- The patient is a well appearing elderly female, alert and oriented x 3 today.   HEENT-head normocephalic, atraumatic, sclera clear, conjunctiva pink, hearing intact, trachea midline. Lungs- Clear to ausculation bilaterally, normal work of breathing Heart- Regular rate and rhythm, no murmurs, rubs or gallops  GI- soft, NT, ND, +  BS Extremities- no clubbing, cyanosis, or edema MS- no significant deformity or atrophy Skin- no rash or lesion Psych- euthymic mood, full affect Neuro- strength and sensation are intact   Wt Readings from Last 3 Encounters:  10/18/20 54.2 kg  10/07/20 56.7 kg  09/02/20 55.8 kg    EKG today demonstrates  SR, ST-T changes (baseline) Vent. rate 76 BPM PR interval 148 ms QRS duration 72 ms QT/QTcB 372/418 ms  Echo 07/29/20 demonstrated  1. Left ventricular ejection fraction, by estimation, is 60 to 65%. The  left ventricle has normal function. The left ventricle has no regional  wall motion abnormalities. The average left ventricular global  longitudinal strain is -17.1 % (appears  underestimated). The global longitudinal strain is probably low normal.  Left ventricular diastolic parameters are consistent with Grade II  diastolic dysfunction (pseudonormalization). Elevated left atrial  pressure.   2. Right ventricular systolic function is normal. The right ventricular  size is normal. There is mildly elevated pulmonary artery systolic  pressure. The estimated right ventricular systolic pressure is 49.1 mmHg.   3. Left atrial size was moderately dilated.   4. The mitral valve is myxomatous. Mild mitral valve regurgitation. No  evidence of mitral stenosis.   5. The aortic valve is tricuspid. Aortic valve regurgitation is mild. No  aortic stenosis is present.   6. The inferior vena cava is dilated in size with >50% respiratory  variability, suggesting right atrial pressure of 8 mmHg.   7. Tricuspid valve regurgitation is mild to moderate.   8. Right atrial size was mildly dilated.   Comparison(s): Prior images unable to be directly viewed, comparison made  by report only. 03/10/14 EF 60-65%. PA pressure 66mmHg.  Epic records are reviewed at length today  CHA2DS2-VASc Score = 6  The patient's score is based upon: CHF History: No HTN History: Yes Diabetes History:  No Stroke  History: Yes Vascular Disease History: No Age Score: 2 Gender Score: 1      ASSESSMENT AND PLAN: 1. Paroxysmal Atrial Fibrillation (ICD10:  I48.0) The patient's CHA2DS2-VASc score is 6, indicating a 9.7% annual risk of stroke.   Patient presents for dofetilide admission. Patient aware of price of dofetilide. Continue Eliquis 2.5 mg BID, states no missed doses in the last 3 weeks. No recent benadryl use QTc in SR 418 ms Labs today show creatinine at 0.76, K+ 4.5 and mag 1.9, CrCl calculated at 44 mL/min  2. Secondary Hypercoagulable State (ICD10:  D68.69) The patient is at significant risk for stroke/thromboembolism based upon her CHA2DS2-VASc Score of 6.  Continue Apixaban (Eliquis).   3. HTN Mildly elevated today. She did not increase amlodipine. This may need to be titrated.   4. Post termination pauses Patient has declined PPM.   To be admitted later today once a bed becomes available.    [facility] [address] [place], [place] [address] [phone] 10/18/2020 11:56 AM

## 2020-10-18 NOTE — Telephone Encounter (Signed)
Medication list reviewed in anticipation of upcoming Tikosyn initiation. Patient is not taking any contraindicated or QTc prolonging medications.   Patient is anticoagulated on Eliquis on the appropriate dose. Please ensure that patient has not missed any anticoagulation doses in the 3 weeks prior to Tikosyn initiation.   Patient will need to be counseled to avoid use of Benadryl while on Tikosyn and in the 2-3 days prior to Tikosyn initiation.  

## 2020-10-18 NOTE — Progress Notes (Signed)
Pharmacy: Dofetilide (Tikosyn) - Initial Consult Assessment and Electrolyte Replacement  Pharmacy consulted to assist in monitoring and replacing electrolytes in this 87 y.o. female admitted on 10/18/2020 undergoing dofetilide initiation. First dofetilide dose: 10/18/20  Assessment:  Patient Exclusion Criteria: If any screening criteria checked as "Yes", then  patient  should NOT receive dofetilide until criteria item is corrected.  If "Yes" please indicate correction plan.  YES  NO Patient  Exclusion Criteria Correction Plan   []   [x]   Baseline QTc interval is greater than or equal to 440 msec. IF above YES box checked dofetilide contraindicated unless patient has ICD; then may proceed if QTc 500-550 msec or with known ventricular conduction abnormalities may proceed with QTc 550-600 msec. QTc =  418    []   [x]   Patient is known or suspected to have a digoxin level greater than 2 ng/ml: No results found for: DIGOXIN     []   [x]   Creatinine clearance less than 20 ml/min (calculated using Cockcroft-Gault, actual body weight and serum creatinine): Estimated Creatinine Clearance: 42.5 mL/min (by C-G formula based on SCr of 0.76 mg/dL).     []   [x]  Patient has received drugs known to prolong the QT intervals within the last 48 hours (phenothiazines, tricyclics or tetracyclic antidepressants, erythromycin, H-1 antihistamines, cisapride, fluoroquinolones, azithromycin, ondansetron).   Updated information on QT prolonging agents is available to be searched on the following database:QT prolonging agents     []   [x]   Patient received a dose of hydrochlorothiazide (Oretic) alone or in any combination including triamterene (Dyazide, Maxzide) in the last 48 hours.    []   [x]  Patient received a medication known to increase dofetilide plasma concentrations prior to initial dofetilide dose:  Trimethoprim (Primsol, Proloprim) in the last 36 hours Verapamil (Calan, Verelan) in the last 36  hours or a sustained release dose in the last 72 hours Megestrol (Megace) in the last 5 days  Cimetidine (Tagamet) in the last 6 hours Ketoconazole (Nizoral) in the last 24 hours Itraconazole (Sporanox) in the last 48 hours  Prochlorperazine (Compazine) in the last 36 hours     []   [x]   Patient is known to have a history of torsades de pointes; congenital or acquired long QT syndromes.    []   [x]   Patient has received a Class 1 antiarrhythmic with less than 2 half-lives since last dose. (Disopyramide, Quinidine, Procainamide, Lidocaine, Mexiletine, Flecainide, Propafenone)    []   [x]   Patient has received amiodarone therapy in the past 3 months or amiodarone level is greater than 0.3 ng/ml.    Patient has been appropriately anticoagulated with apixaban 2.5mg  BID..  Labs:    Component Value Date/Time   K 4.5 10/18/2020 1132   MG 1.9 10/18/2020 1132     Plan: Potassium: K >/= 4: Appropriate to initiate Tikosyn, no replacement needed    Magnesium: Mg 1.8-2: Give Mg 2 gm IV x1 to prevent Mg from dropping below 1.8 - do not need to recheck Mg. Appropriate to initiate Tikosyn   Thank you for allowing pharmacy to participate in this patient's care   Arrie Senate, PharmD, BCPS, [facility] Clinical Pharmacist [phone] Please check AMION for all [place] numbers 10/18/2020

## 2020-10-18 NOTE — TOC Benefit Eligibility Note (Signed)
Transition of Care (TOC) Benefit Eligibility Note    Patient Details  Name: [name] MRN: [id number] Date of Birth: 09/10/1931   Medication/Dose: TIKOSYN  125 MCG BID  CO-PAY- $64.00     TIKOSYN 250 MCG BID  CO-PAY-$64.00      TIKOSYN 500 MCG BID  CO-PAY- $64.00  Covered?: Yes  Tier: 3 Drug  Prescription Coverage Preferred Pharmacy: [facility]  Spoke with Person/Company/Phone Number:: ART  @  [employer] # [phone]  Co-Pay: $64.00  Prior Approval: No  Deductible: Met  Additional Notes: DOFETILIDE 125 MCG BID  CO-PAY- $10.00    DOFETILIDE  250 MCG BID  CO-PAY- $10.00   DOFETILIE  500 MCG BID CO-PAY $10.00     COVER- YES  TIER- 1 DRUG  P/A-NO    ,  Phone Number: 10/18/2020, 5:10 PM     

## 2020-10-18 NOTE — Care Management (Addendum)
4707 10-18-20 Benefits check submitted for Tikosyn. Case Manager will follow for cost and pharmacy of choice.   1735 10-18-20 Case Manager discussed cost with the patient and she states the cost is affordable. Patient would like to get her first fill via [facility] and the Rx refills to be sent to [place]. No further needs identified at this time.

## 2020-10-19 ENCOUNTER — Inpatient Hospital Stay (HOSPITAL_COMMUNITY): Payer: Medicare PPO

## 2020-10-19 ENCOUNTER — Encounter (HOSPITAL_COMMUNITY): Payer: Self-pay | Admitting: Internal Medicine

## 2020-10-19 DIAGNOSIS — I10 Essential (primary) hypertension: Secondary | ICD-10-CM

## 2020-10-19 DIAGNOSIS — R9431 Abnormal electrocardiogram [ECG] [EKG]: Secondary | ICD-10-CM

## 2020-10-19 LAB — BASIC METABOLIC PANEL
Anion gap: 5 (ref 5–15)
BUN: 13 mg/dL (ref 8–23)
CO2: 28 mmol/L (ref 22–32)
Calcium: 8.6 mg/dL — ABNORMAL LOW (ref 8.9–10.3)
Chloride: 103 mmol/L (ref 98–111)
Creatinine, Ser: 0.71 mg/dL (ref 0.44–1.00)
GFR, Estimated: >60 mL/min (ref >60)
Glucose, Bld: 87 mg/dL (ref 70–99)
Potassium: 3.8 mmol/L (ref 3.5–5.1)
Sodium: 136 mmol/L (ref 135–145)

## 2020-10-19 LAB — NM MYOCAR MULTI W/SPECT W/WALL MOTION / EF
Estimated workload: 1 METS
Exercise duration (min): 5 min
Exercise duration (sec): 16 s
Peak HR: 86 {beats}/min
Rest HR: 54 {beats}/min

## 2020-10-19 LAB — MAGNESIUM: Magnesium: 2.2 mg/dL (ref 1.7–2.4)

## 2020-10-19 MED ORDER — TECHNETIUM TC 99M TETROFOSMIN IV KIT
32.0000 | PACK | Freq: Once | INTRAVENOUS | Status: AC | PRN
  Administered 2020-10-19: 32 via INTRAVENOUS

## 2020-10-19 MED ORDER — REGADENOSON 0.4 MG/5ML IV SOLN
INTRAVENOUS | Status: AC
  Filled 2020-10-19: qty 5

## 2020-10-19 MED ORDER — POTASSIUM CHLORIDE CRYS ER 20 MEQ PO TBCR
40.0000 meq | EXTENDED_RELEASE_TABLET | Freq: Once | ORAL | Status: AC
  Administered 2020-10-19: 40 meq via ORAL
  Filled 2020-10-19: qty 2

## 2020-10-19 MED ORDER — REGADENOSON 0.4 MG/5ML IV SOLN
0.4000 mg | Freq: Once | INTRAVENOUS | Status: AC
  Administered 2020-10-19: 0.4 mg via INTRAVENOUS
  Filled 2020-10-19: qty 5

## 2020-10-19 MED ORDER — TECHNETIUM TC 99M TETROFOSMIN IV KIT
10.9000 | PACK | Freq: Once | INTRAVENOUS | Status: AC | PRN
  Administered 2020-10-19: 10.9 via INTRAVENOUS

## 2020-10-19 NOTE — Progress Notes (Signed)
Pharmacy: Dofetilide (Tikosyn) - Follow Up Assessment and Electrolyte Replacement  Pharmacy consulted to assist in monitoring and replacing electrolytes in this 87 y.o. female admitted on 10/18/2020 undergoing dofetilide initiation. First dofetilide dose: 10/18/20   Labs:    Component Value Date/Time   K 3.8 10/19/2020 0211   MG 2.2 10/19/2020 0211     Plan: Potassium: K 3.8-3.9:  Give KCl 40 mEq po x1   Magnesium: Mg > 2: No additional supplementation needed    Thank you for allowing pharmacy to participate in this patient's care   Arrie Senate, PharmD, BCPS, [facility] Clinical Pharmacist [phone] Please check AMION for all [place] numbers 10/19/2020

## 2020-10-19 NOTE — Progress Notes (Signed)
Notified by CCMD at 0300, pt had 3 beat run NSVT.  Patient resting without c/o.  Will continue to monitor pt closely.

## 2020-10-19 NOTE — Progress Notes (Addendum)
Progress Note  Patient Name: [name] Date of Encounter: 10/19/2020  [facility] Cardiologist: Dr. Caryl Comes  Subjective   Feeling well, exercising in her room as we enter  Inpatient Medications    Scheduled Meds:  acetaminophen  1,000 mg Oral QHS   amLODipine  5 mg Oral Daily   apixaban  2.5 mg Oral BID   cholecalciferol  1,000 Units Oral Daily   dofetilide  250 mcg Oral BID   levothyroxine  112 mcg Oral Q0600   multivitamin with minerals  1 tablet Oral Daily   omega-3 acid ethyl esters  1 g Oral Daily   polyvinyl alcohol  1 drop Both Eyes QHS   potassium chloride  40 mEq Oral Once   predniSONE  6 mg Oral Q breakfast   sodium chloride flush  3 mL Intravenous Q12H   Continuous Infusions:  sodium chloride     PRN Meds: sodium chloride, acetaminophen, ALPRAZolam, sodium chloride flush   Vital Signs    Vitals:   10/18/20 1845 10/18/20 2026 10/18/20 2200 10/19/20 0606  BP: (!) 170/86 (!) 173/71 (!) 154/76 (!) 157/74  Pulse:  (!) 58  (!) 52  Resp: 18 17  16   Temp: 98.4 F (36.9 C) (!) 97.5 F (36.4 C)  98.4 F (36.9 C)  TempSrc:  Oral  Oral  SpO2: 100% 100%  100%  Weight:      Height:        Intake/Output Summary (Last 24 hours) at 10/19/2020 0825 Last data filed at 10/18/2020 2135 Gross per 24 hour  Intake 293 ml  Output --  Net 293 ml   Last 3 Weights 10/18/2020 10/18/2020 10/07/2020  Weight (lbs) 119 lb 12.8 oz 119 lb 6.4 oz 125 lb  Weight (kg) 54.341 kg 54.159 kg 56.7 kg      Telemetry    SB 40s-50's,  one 3beat NSVT - Personally Reviewed with dr. Caryl Comes  ECG    SB 48, QT 460, QTc 431ms, ST changes better - Personally Reviewed  Physical Exam   Examined by Dr. Caryl Comes GEN: No acute distress.   Neck: No JVD Cardiac: RRR, no murmurs, rubs, or gallops.  Respiratory: CTA b/l. GI: Soft, nontender, non-distended  MS: No edema; No deformity. Neuro:  Nonfocal  Psych: Normal affect   Labs    High Sensitivity Troponin:  No results for input(s):  TROPONINIHS in the last 720 hours.    Chemistry Recent Labs  Lab 10/18/20 1132 10/19/20 0211  NA 136 136  K 4.5 3.8  CL 100 103  CO2 28 28  GLUCOSE 175* 87  BUN 15 13  CREATININE 0.76 0.71  CALCIUM 9.5 8.6*  GFRNONAA >60 >60  ANIONGAP 8 5     HematologyNo results for input(s): WBC, RBC, HGB, HCT, MCV, MCH, MCHC, RDW, PLT in the last 168 hours.  BNPNo results for input(s): BNP, PROBNP in the last 168 hours.   DDimer No results for input(s): DDIMER in the last 168 hours.   Radiology    No results found.  Cardiac Studies   Echo 07/29/20 demonstrated 1. Left ventricular ejection fraction, by estimation, is 60 to 65%. The  left ventricle has normal function. The left ventricle has no regional  wall motion abnormalities. The average left ventricular global  longitudinal strain is -17.1 % (appears  underestimated). The global longitudinal strain is probably low normal.  Left ventricular diastolic parameters are consistent with Grade II  diastolic dysfunction (pseudonormalization). Elevated left atrial  pressure.  2. Right ventricular systolic function is normal. The right ventricular  size is normal. There is mildly elevated pulmonary artery systolic  pressure. The estimated right ventricular systolic pressure is 76.1 mmHg.   3. Left atrial size was moderately dilated.   4. The mitral valve is myxomatous. Mild mitral valve regurgitation. No  evidence of mitral stenosis.   5. The aortic valve is tricuspid. Aortic valve regurgitation is mild. No  aortic stenosis is present.   6. The inferior vena cava is dilated in size with >50% respiratory  variability, suggesting right atrial pressure of 8 mmHg.   7. Tricuspid valve regurgitation is mild to moderate.   8. Right atrial size was mildly dilated.   Comparison(s): Prior images unable to be directly viewed, comparison made  by report only. 03/10/14 EF 60-65%. PA pressure 35mmHg.  Patient Profile     87 y.o. female w/PMHx  of stroke, HTN, hypothyroidism, AFib admitted for Tikosyn initiation  Assessment & Plan    Paroxysmal AFib CHA2DS2Vasc is 6, on eliquis, appropriately dosed Tikosyn load is in progress K+ 3.8 replaced Mag 2.2 Creat 0.71 QTc stable  2. Abnormal EKG No anginal complaints ST better on last EKG Planned for stress test today  3. HTN Home meds  4. Sinus bradycardia Known to have po termination pauses, she has declined pacing Asymptomatic Do not hold tikosyn for bradycardia  For questions or updates, please contact [place] Please consult [email or web address] for contact info under        Signed, Baldwin [place], PA-C  10/19/2020, 8:25 AM    As above  Tolerating dofetilide, ECG  QTc < 450 msec Continue current dose ECG dynamic ST changes resolved myoview today

## 2020-10-19 NOTE — Progress Notes (Signed)
[name] presented for a nuclear stress test today.  No immediate complications.  Stress imaging is pending at this time.   Preliminary EKG findings may be listed in the chart, but the stress test result will not be finalized until perfusion imaging is complete.   [place], [place]  10/19/2020 1:04 PM

## 2020-10-20 LAB — BASIC METABOLIC PANEL
Anion gap: 7 (ref 5–15)
BUN: 24 mg/dL — ABNORMAL HIGH (ref 8–23)
CO2: 26 mmol/L (ref 22–32)
Calcium: 9.4 mg/dL (ref 8.9–10.3)
Chloride: 105 mmol/L (ref 98–111)
Creatinine, Ser: 0.69 mg/dL (ref 0.44–1.00)
GFR, Estimated: >60 mL/min (ref >60)
Glucose, Bld: 96 mg/dL (ref 70–99)
Potassium: 4.5 mmol/L (ref 3.5–5.1)
Sodium: 138 mmol/L (ref 135–145)

## 2020-10-20 LAB — MAGNESIUM: Magnesium: 2.1 mg/dL (ref 1.7–2.4)

## 2020-10-20 NOTE — Progress Notes (Signed)
Pharmacy: Dofetilide (Tikosyn) - Follow Up Assessment and Electrolyte Replacement  Pharmacy consulted to assist in monitoring and replacing electrolytes in this 87 y.o. female admitted on 10/18/2020 undergoing dofetilide initiation. First dofetilide dose: 6/13 @ 2000 pm.  250 mcg BID.  Labs:    Component Value Date/Time   K 4.5 10/20/2020 0231   MG 2.1 10/20/2020 0231     Plan: Potassium: K >/= 4: No additional supplementation needed  Magnesium: Mg > 2: No additional supplementation needed   As patient has required on average 0-40 mEq of potassium replacement every day, recommend discharging patient with prescription for:  Potassium chloride 10-20 mEq  daily  Thank you for allowing pharmacy to participate in this patient's care    [place] Crane, Roylene Reason, [facility] Clinical Pharmacist  10/20/2020 1:49 PM   [facility] pharmacy phone numbers are listed on [email or web address]

## 2020-10-20 NOTE — Progress Notes (Signed)
Progress Note  Patient Name: [name] Date of Encounter: 10/20/2020  [facility] Cardiologist: Dr. Caryl Comes  Subjective   Without complaint  Inpatient Medications    Scheduled Meds:  acetaminophen  1,000 mg Oral QHS   amLODipine  5 mg Oral Daily   apixaban  2.5 mg Oral BID   cholecalciferol  1,000 Units Oral Daily   dofetilide  250 mcg Oral BID   levothyroxine  112 mcg Oral Q0600   multivitamin with minerals  1 tablet Oral Daily   omega-3 acid ethyl esters  1 g Oral Daily   polyvinyl alcohol  1 drop Both Eyes QHS   predniSONE  6 mg Oral Q breakfast   sodium chloride flush  3 mL Intravenous Q12H   Continuous Infusions:  sodium chloride     PRN Meds: sodium chloride, acetaminophen, ALPRAZolam, sodium chloride flush   Vital Signs    Vitals:   10/19/20 1259 10/19/20 1414 10/19/20 2111 10/20/20 0403  BP: (!) 163/73 (!) 168/69 140/71 140/73  Pulse:  63 60 (!) 52  Resp:  16 19 19   Temp:  (!) 97.3 F (36.3 C) 97.9 F (36.6 C) 98.4 F (36.9 C)  TempSrc:  Oral Oral Oral  SpO2:  100% 98% 95%  Weight:      Height:        Intake/Output Summary (Last 24 hours) at 10/20/2020 1046 Last data filed at 10/20/2020 0906 Gross per 24 hour  Intake 240 ml  Output --  Net 240 ml    Last 3 Weights 10/18/2020 10/18/2020 10/07/2020  Weight (lbs) 119 lb 12.8 oz 119 lb 6.4 oz 125 lb  Weight (kg) 54.341 kg 54.159 kg 56.7 kg      Telemetry  Sinus brady   ECG  Sinus brady  QTc < 450 msexc ST changes resolved - Personally Reviewed  Physical Exam   Well developed and nourished in no acute distress HENT normal Neck supple with JVP-  flat  Clear Regular rate and rhythm, no murmurs or gallops Abd-soft with active BS No Clubbing cyanosis edema Skin-warm and dry A & Oriented  Grossly normal sensory and motor function    Labs    High Sensitivity Troponin:  No results for input(s): TROPONINIHS in the last 720 hours.    Chemistry Recent Labs  Lab 10/18/20 1132  10/19/20 0211 10/20/20 0231  NA 136 136 138  K 4.5 3.8 4.5  CL 100 103 105  CO2 28 28 26   GLUCOSE 175* 87 96  BUN 15 13 24*  CREATININE 0.76 0.71 0.69  CALCIUM 9.5 8.6* 9.4  GFRNONAA >60 >60 >60  ANIONGAP 8 5 7       HematologyNo results for input(s): WBC, RBC, HGB, HCT, MCV, MCH, MCHC, RDW, PLT in the last 168 hours.  BNPNo results for input(s): BNP, PROBNP in the last 168 hours.   DDimer No results for input(s): DDIMER in the last 168 hours.   Radiology    NM Myocar Multi W/Spect W/Wall Motion / EF  Result Date: 10/19/2020 CLINICAL DATA:  Abnormal EKG.  History of CVA.  Hypertension. EXAM: MYOCARDIAL IMAGING WITH SPECT (REST AND PHARMACOLOGIC-STRESS) GATED LEFT VENTRICULAR WALL MOTION STUDY LEFT VENTRICULAR EJECTION FRACTION TECHNIQUE: Standard myocardial SPECT imaging was performed after resting intravenous injection of 10.9 mCi Tc-71m tetrofosmin. Subsequently, intravenous infusion of Lexiscan was performed under the supervision of the Cardiology staff. At peak effect of the drug, 32 mCi Tc-73m tetrofosmin was injected intravenously and standard myocardial SPECT imaging was  performed. Quantitative gated imaging was also performed to evaluate left ventricular wall motion, and estimate left ventricular ejection fraction. COMPARISON:  11/28/2019 chest radiograph. FINDINGS: Perfusion: No decreased activity in the left ventricle on stress imaging to suggest reversible ischemia or infarction. Wall Motion: Normal left ventricular wall motion. No left ventricular dilation. Left Ventricular Ejection Fraction: 73 % End diastolic volume 78 ml End systolic volume 21 ml IMPRESSION: 1. No reversible ischemia or infarction. 2. Normal left ventricular wall motion. 3. Left ventricular ejection fraction 73% 4. Non invasive risk stratification*: Low *2012 Appropriate Use Criteria for Coronary Revascularization Focused Update: J Am Coll Cardiol. 0454;09(8):119-147.  http://content.airportbarriers.com.aspx?articleid=[email or web address] Electronically Signed   By: Abigail Miyamoto M.D.   On: 10/19/2020 14:43    Cardiac Studies   Echo 07/29/20 demonstrated 1. Left ventricular ejection fraction, by estimation, is 60 to 65%. The  left ventricle has normal function. The left ventricle has no regional  wall motion abnormalities. The average left ventricular global  longitudinal strain is -17.1 % (appears  underestimated). The global longitudinal strain is probably low normal.  Left ventricular diastolic parameters are consistent with Grade II  diastolic dysfunction (pseudonormalization). Elevated left atrial  pressure.   2. Right ventricular systolic function is normal. The right ventricular  size is normal. There is mildly elevated pulmonary artery systolic  pressure. The estimated right ventricular systolic pressure is 82.9 mmHg.   3. Left atrial size was moderately dilated.   4. The mitral valve is myxomatous. Mild mitral valve regurgitation. No  evidence of mitral stenosis.   5. The aortic valve is tricuspid. Aortic valve regurgitation is mild. No  aortic stenosis is present.   6. The inferior vena cava is dilated in size with >50% respiratory  variability, suggesting right atrial pressure of 8 mmHg.   7. Tricuspid valve regurgitation is mild to moderate.   8. Right atrial size was mildly dilated.   Comparison(s): Prior images unable to be directly viewed, comparison made  by report only. 03/10/14 EF 60-65%. PA pressure 8mmHg.  Patient Profile     87 y.o. female w/PMHx of stroke, HTN, hypothyroidism, AFib admitted for Tikosyn initiation Abnormal eECG >> myoview neg  Assessment & Plan    Paroxysmal AFib    2. Abnormal EKG    3. HTN     4. Sinus bradycardia    For discharge on dofetilide after 3 days     Signed, Virl Axe, MD  10/20/2020, 10:46 AM

## 2020-10-21 ENCOUNTER — Other Ambulatory Visit (HOSPITAL_COMMUNITY): Payer: Self-pay

## 2020-10-21 LAB — BASIC METABOLIC PANEL
Anion gap: 5 (ref 5–15)
BUN: 18 mg/dL (ref 8–23)
CO2: 29 mmol/L (ref 22–32)
Calcium: 9.1 mg/dL (ref 8.9–10.3)
Chloride: 103 mmol/L (ref 98–111)
Creatinine, Ser: 0.77 mg/dL (ref 0.44–1.00)
GFR, Estimated: >60 mL/min (ref >60)
Glucose, Bld: 89 mg/dL (ref 70–99)
Potassium: 4.3 mmol/L (ref 3.5–5.1)
Sodium: 137 mmol/L (ref 135–145)

## 2020-10-21 LAB — MAGNESIUM: Magnesium: 1.8 mg/dL (ref 1.7–2.4)

## 2020-10-21 MED ORDER — DOFETILIDE 250 MCG PO CAPS
250.0000 ug | ORAL_CAPSULE | Freq: Two times a day (BID) | ORAL | 0 refills | Status: DC
  Filled 2020-10-21: qty 180, 90d supply, fill #0

## 2020-10-21 MED ORDER — MAGNESIUM SULFATE 50 % IJ SOLN
3.0000 g | Freq: Once | INTRAVENOUS | Status: AC
  Administered 2020-10-21: 3 g via INTRAVENOUS
  Filled 2020-10-21: qty 6

## 2020-10-21 MED ORDER — DOFETILIDE 250 MCG PO CAPS: 250.0000 ug | ORAL_CAPSULE | Freq: Two times a day (BID) | ORAL | 4 refills | Status: DC

## 2020-10-21 MED ORDER — MAGNESIUM SULFATE IN D5W 1-5 GM/100ML-% IV SOLN
1.0000 g | Freq: Once | INTRAVENOUS | Status: DC
  Filled 2020-10-21: qty 100

## 2020-10-21 MED ORDER — DOFETILIDE 250 MCG PO CAPS
250.0000 ug | ORAL_CAPSULE | Freq: Two times a day (BID) | ORAL | 0 refills | Status: DC
  Filled 2020-10-21: qty 60, 30d supply, fill #0

## 2020-10-21 MED ORDER — MAGNESIUM OXIDE -MG SUPPLEMENT 200 MG PO TABS: 200.0000 mg | ORAL_TABLET | Freq: Every day | ORAL | 5 refills | Status: DC

## 2020-10-21 NOTE — Progress Notes (Signed)
Pt is alert and oriented. Discharge instructions/ AVS given to pt. 

## 2020-10-21 NOTE — Care Management Important Message (Signed)
Important Message  Patient Details  Name: BESSIE BOYTE MRN: [id number] Date of Birth: 07/17/1931   Medicare Important Message Given:  Yes  Patient left prior to IM delivery will mail to the home address.    Emanuele Mcwhirter 10/21/2020, 3:09 PM

## 2020-10-21 NOTE — Discharge Summary (Signed)
ELECTROPHYSIOLOGY PROCEDURE DISCHARGE SUMMARY    Patient ID: [name],  MRN: [id number], DOB/AGE: 12-12-1931 87 y.o.  Admit date: 10/18/2020 Discharge date: 10/21/2020  Primary Care Physician: Girtha Rm, NP-C  Primary Cardiologist/Electrophysiologist: Dr. Caryl Comes  Primary Discharge Diagnosis:  1.  Paroxysmal atrial fibrillation status post Tikosyn loading this admission      CHA2DS2Vasc is 6, on Eliquis, appropriately dosed 2. Abnormal EKG without symotoms  Secondary Discharge Diagnosis:  HTN Sinus bradycardia asymptomatic   Allergies  Allergen Reactions   Lactose Intolerance (Gi) Other (See Comments)    Bloating, gas   Epinephrine Other (See Comments)     Reaction:  Caused pt to scream uncontrollably.     Procedures This Admission:  1.  Tikosyn loading    Brief HPI: [name] is a 87 y.o. female with a past medical history as noted above.  They were referred to EP in the outpatient setting for treatment options of atrial fibrillation.  Risks, benefits, and alternatives to Tikosyn were reviewed with the patient who wished to proceed.    Hospital Course:  The patient was admitted and Tikosyn was initiated.  Renal function and electrolytes were followed during the hospitalization.  The patient was noted to have abnormal EKG on her pre-dose EKG without symptoms of angina, a stress test was persued and negative for ischemia/infarct.  The patient's QTc remained stable.  She arrived and maintained sinus and did not require DCCV.  She was monitored until discharge on telemetry which demonstrated SB/SR.  On the day of discharge, the patient feels well, was examined by Dr Caryl Comes who considered the patient stable for discharge to home.  Follow-up has been arranged with the AFib clinic in 1 week and with Dr Caryl Comes in 4 weeks.   Tikosyn teaching was completed electrolyte replacement for home, add mag ox 200mg  daily   Physical Exam: Vitals:   10/20/20 2016 10/21/20  0640 10/21/20 0739 10/21/20 0838  BP: 138/73 (!) 155/80 (!) 152/74 127/70  Pulse: 70 (!) 58    Resp: 16 16 16    Temp: 97.9 F (36.6 C) (!) 97.5 F (36.4 C) (!) 97.4 F (36.3 C)   TempSrc: Oral Oral Oral   SpO2: 97% 99%    Weight:      Height:         GEN- The patient is well appearing, alert and oriented x 3 today.   HEENT: normocephalic, atraumatic; sclera clear, conjunctiva pink; hearing intact; oropharynx clear; neck supple, no JVP Lymph- no cervical lymphadenopathy Lungs- CTA b/l, normal work of breathing.  No wheezes, rales, rhonchi Heart- RRR, no murmurs, rubs or gallops, PMI not laterally displaced GI- soft, non-tender, non-distended Extremities- no clubbing, cyanosis, or edema MS- no significant deformity or atrophy Skin- warm and dry, no rash or lesion Psych- euthymic mood, full affect Neuro- strength and sensation are intact   Labs:   Lab Results  Component Value Date   WBC 7.7 02/26/2020   HGB 10.2 (L) 02/26/2020   HCT 31.2 (L) 02/26/2020   MCV 92 02/26/2020   PLT 275 02/26/2020    Recent Labs  Lab 10/21/20 0154  NA 137  K 4.3  CL 103  CO2 29  BUN 18  CREATININE 0.77  CALCIUM 9.1  GLUCOSE 89     Discharge Medications:  Allergies as of 10/21/2020       Reactions   Lactose Intolerance (gi) Other (See Comments)   Bloating, gas   Epinephrine Other (See  Comments)    Reaction:  Caused pt to scream uncontrollably.        Medication List     TAKE these medications    acetaminophen 500 MG tablet Commonly known as: TYLENOL Take 500-1,000 mg by mouth See admin instructions. Take 2 tablets (1000 mg) by mouth daily at bedtime, may also take 1 tablet (500 mg) in the morning as needed for pain   ALPRAZolam 0.25 MG tablet Commonly known as: XANAX Take 1/2 to 1 tablet once daily as needed for panic attacks. What changed:  how much to take how to take this when to take this reasons to take this additional instructions   amLODipine 5 MG  tablet Commonly known as: NORVASC Take 1.5 tablets (7.5 mg total) by mouth daily. What changed:  how much to take when to take this   COQ-10 PO Take 1 capsule by mouth daily with breakfast.   dofetilide 250 MCG capsule Commonly known as: TIKOSYN Take 1 capsule (250 mcg total) by mouth 2 (two) times daily.   Eliquis 2.5 MG Tabs tablet Generic drug: apixaban TAKE 1 TABLET(2.5 MG) BY MOUTH TWICE DAILY What changed: See the new instructions.   FISH OIL PO Take 1 capsule by mouth in the morning and at bedtime.   levothyroxine 112 MCG tablet Commonly known as: SYNTHROID Take 1 tablet (112 mcg total) by mouth daily. What changed: when to take this   Magnesium Oxide 200 MG Tabs Take 1 tablet (200 mg total) by mouth daily.   multivitamins with iron Tabs tablet Take 1 tablet by mouth daily with breakfast.   predniSONE 1 MG tablet Commonly known as: DELTASONE Take 1 mg by mouth daily with breakfast. Take with a 5 mg tablet for a total dose of 6 mg   predniSONE 5 MG tablet Commonly known as: DELTASONE Take 5 mg by mouth daily. Take with a 1 mg tablet for a total dose of 6 mg   SYSTANE BALANCE OP Place 1 drop into both eyes in the morning and at bedtime.   Vitamin D (Cholecalciferol) 25 MCG (1000 UT) Tabs Take 1,000 Units by mouth daily with breakfast.       ASK your doctor about these medications    predniSONE 10 MG tablet Commonly known as: DELTASONE Take 1 tablet (10 mg total) by mouth daily with breakfast.        Disposition: Home Discharge Instructions     Diet - low sodium heart healthy   Complete by: As directed    Increase activity slowly   Complete by: As directed        Follow-up Information     [facility].   Specialty: Cardiology Why: 10/28/20 @ 1:30PM with R. Marlene Lard, PA-C Contact information: [address] [id number] mc [place] [phone]        Deboraha Sprang, MD  Follow up.   Specialty: Cardiology Why: 11/24/20 @ 2:30PM Contact information: [address] [address] [phone]                 Duration of Discharge Encounter: Greater than 30 minutes including physician time.  Venetia Night, PA-C 10/21/2020 11:46 AM

## 2020-10-21 NOTE — Progress Notes (Signed)
Pharmacy: Dofetilide (Tikosyn) - Follow Up Assessment and Electrolyte Replacement  Pharmacy consulted to assist in monitoring and replacing electrolytes in this 87 y.o. female admitted on 10/18/2020 undergoing dofetilide initiation. First dofetilide dose: 6/13 @ 2000 pm.  250 mcg BID.  Labs:    Component Value Date/Time   K 4.3 10/21/2020 0154   MG 1.8 10/21/2020 0154     Plan: Potassium: K >/= 4: No additional supplementation needed  Magnesium: Mg 1.8-2: Give Mg 3 gm IV x1 (will dose a little aggressively to prepare for discharge)   Recommend discharging on MgOx 200 mg daily to keep Mg stores up.  Thank you for allowing pharmacy to participate in this patient's care   [place] Crane, Roylene Reason, [facility] Clinical Pharmacist  10/21/2020 7:42 AM   [facility] pharmacy phone numbers are listed on [email or web address]

## 2020-10-28 ENCOUNTER — Ambulatory Visit (HOSPITAL_COMMUNITY): Payer: Medicare PPO | Admitting: Physician Assistant

## 2020-11-01 ENCOUNTER — Ambulatory Visit (HOSPITAL_COMMUNITY)
Admission: RE | Admit: 2020-11-01 | Discharge: 2020-11-01 | Disposition: A | Discharge status: Home / Self Care | Payer: Medicare PPO | Source: Ambulatory Visit | Attending: Physician Assistant | Admitting: Physician Assistant

## 2020-11-01 ENCOUNTER — Other Ambulatory Visit: Payer: Self-pay

## 2020-11-01 VITALS — BP 164/72 | HR 52 | Ht 65.0 in | Wt 121.2 lb

## 2020-11-01 DIAGNOSIS — Z79899 Other long term (current) drug therapy: Secondary | ICD-10-CM | POA: Diagnosis not present

## 2020-11-01 DIAGNOSIS — Z87891 Personal history of nicotine dependence: Secondary | ICD-10-CM | POA: Diagnosis not present

## 2020-11-01 DIAGNOSIS — Z7901 Long term (current) use of anticoagulants: Secondary | ICD-10-CM | POA: Diagnosis not present

## 2020-11-01 DIAGNOSIS — Z888 Allergy status to other drugs, medicaments and biological substances status: Secondary | ICD-10-CM | POA: Diagnosis not present

## 2020-11-01 DIAGNOSIS — D6869 Other thrombophilia: Secondary | ICD-10-CM | POA: Diagnosis not present

## 2020-11-01 DIAGNOSIS — E039 Hypothyroidism, unspecified: Secondary | ICD-10-CM | POA: Diagnosis not present

## 2020-11-01 DIAGNOSIS — Z79890 Hormone replacement therapy (postmenopausal): Secondary | ICD-10-CM | POA: Insufficient documentation

## 2020-11-01 DIAGNOSIS — Z8249 Family history of ischemic heart disease and other diseases of the circulatory system: Secondary | ICD-10-CM | POA: Insufficient documentation

## 2020-11-01 DIAGNOSIS — Z8673 Personal history of transient ischemic attack (TIA), and cerebral infarction without residual deficits: Secondary | ICD-10-CM | POA: Insufficient documentation

## 2020-11-01 DIAGNOSIS — I480 Paroxysmal atrial fibrillation: Secondary | ICD-10-CM | POA: Insufficient documentation

## 2020-11-01 DIAGNOSIS — I10 Essential (primary) hypertension: Secondary | ICD-10-CM | POA: Diagnosis not present

## 2020-11-01 LAB — BASIC METABOLIC PANEL
Anion gap: 6 (ref 5–15)
BUN: 17 mg/dL (ref 8–23)
CO2: 29 mmol/L (ref 22–32)
Calcium: 9.4 mg/dL (ref 8.9–10.3)
Chloride: 102 mmol/L (ref 98–111)
Creatinine, Ser: 0.71 mg/dL (ref 0.44–1.00)
GFR, Estimated: >60 mL/min (ref >60)
Glucose, Bld: 93 mg/dL (ref 70–99)
Potassium: 4.3 mmol/L (ref 3.5–5.1)
Sodium: 137 mmol/L (ref 135–145)

## 2020-11-01 LAB — MAGNESIUM: Magnesium: 2.2 mg/dL (ref 1.7–2.4)

## 2020-11-01 NOTE — Progress Notes (Signed)
Primary Care Physician: Girtha Rm, NP-C Primary Electrophysiologist: Dr Caryl Comes Referring Physician: Dr Marcene Duos is a 87 y.o. female with a history of prior CVA, HTN, hypothyroidism, atrial fibrillation who presents for follow up in the [facility]. The patient was initially diagnosed with atrial fibrillation on an ILR after a cryptogenic stroke. Patient is on Eliquis for a CHADS2VASC score of 6. She was recently started on PRN flecainide which she took on 08/14/20. This did help her restore SR but she also had a fainting spell. She does have a h/o post termination pauses.   On follow up today, patient is s/p dofetilide admission 6/13-6/16/22. Patient reports that she has done well since the hospitalization. She denies any heart racing or palpitations. She did have a mild headache when she started dofetilide but this has resolved.   Today, she denies symptoms of palpitations, chest pain, shortness of breath, orthopnea, PND, lower extremity edema, dizziness, presyncope, syncope, snoring, daytime somnolence, bleeding, or neurologic sequela. The patient is tolerating medications without difficulties and is otherwise without complaint today.    Atrial Fibrillation Risk Factors:  she does not have symptoms or diagnosis of sleep apnea. she does not have a history of rheumatic fever.   she has a BMI of Body mass index is 20.17 kg/m.Marland Kitchen Filed Weights   11/01/20 1530  Weight: 55 kg      Family History  Problem Relation Age of Onset   Hodgkin's lymphoma Mother    Celiac disease Mother    Cancer Mother    Melanoma Father    Cancer Father    Cancer Brother    Arthritis Sister    Ulcerative colitis Sister    Heart disease Paternal Grandmother    Heart disease Paternal Grandfather      Atrial Fibrillation Management history:  Previous antiarrhythmic drugs: flecainide, dofetilide  Previous cardioversions: none Previous ablations:  none CHADS2VASC score: 6 Anticoagulation history: Eliquis   Past Medical History:  Diagnosis Date   Anemia    hx of with surgery    Arthritis 07-27-11   osteoarthritis-hips/s/p bil. THA, arthritis right hand    Blepharitis of left eye    Blood transfusion feb 2014   post surgery some time ago   Cancer [facility]) 1989   Breast cancer -s/p lt. mastectomy, some squamous cell lesions   Cold hands    Complication of anesthesia    epinephrine - screams uncontrollably / pt does not want general anesthesia or narcotics   Constipation    Fuchs' corneal dystrophy    both eyes   Hard of hearing    both ears mild loss   Hyperkalemia 03/07/2018   Hypertension    Hypothyroidism 3-21- 13   tx. levothyroxine   Lichen sclerosus et atrophicus of the vulva    pt reported.    Osteopenia    Stroke [facility])    a. s/p MDT LINQ 04/2014   Past Surgical History:  Procedure Laterality Date   ABDOMINAL HYSTERECTOMY     ovaries removed   ANKLE SURGERY  2005   right ankle tendon repair   basal cell removed from face     3-4 times   BREAST SURGERY  07-1987   lt.breast cancer intraductal cancer, mastectomy   CATARACT EXTRACTION, BILATERAL  few yrs ago   Bilateral   HARDWARE REMOVAL  04/05/2012   Procedure: HARDWARE REMOVAL;  Surgeon: Gearlean Alf, MD;  Location: [facility];  Service: Orthopedics;  Laterality: Right;  Removal of Hardware Right Femur    HARDWARE REMOVAL Left 06/30/2013   Procedure: LEFT HIP HARDWARE REMOVAL OF CABLES;  Surgeon: Gearlean Alf, MD;  Location: [facility];  Service: Orthopedics;  Laterality: Left;   HARDWARE REMOVAL Right 08/21/2013   Procedure: RIGHT HIP HARDWARE REMOVAL;  Surgeon: Gearlean Alf, MD;  Location: [facility];  Service: Orthopedics;  Laterality: Right;   JOINT REPLACEMENT  07-27-11   Bilateral: Right THA - 04/2000, Left THA 06/2001   left hip replacement Left feb 2003   LOOP RECORDER IMPLANT  04-08-2014   MDT LINQ implanted by Dr Lovena Le for cryptogenic stroke   LOOP  RECORDER REMOVAL N/A 04/26/2017   Procedure: LOOP RECORDER REMOVAL;  Surgeon: Evans Lance, MD;  Location: [place] [facility];  Service: Cardiovascular;  Laterality: N/A;   MASTECTOMY  1989   left - Pt states has no restrictions on use of L arm for BP or blood draw   melanoma removed Left    x 1   ORIF FEMUR FRACTURE  07/31/2011   Procedure: OPEN REDUCTION INTERNAL FIXATION (ORIF) DISTAL FEMUR FRACTURE;  Surgeon: Gearlean Alf, MD;  Location: [facility];  Service: Orthopedics;  Laterality: Right;  right femur  (c-arm)    SQUAMOUS CELL CARCINOMA EXCISION  07-27-11   x2 left shoulder   TOTAL HIP REVISION  04/10/2012   Procedure: TOTAL HIP REVISION;  Surgeon: Gearlean Alf, MD;  Location: [facility];  Service: Orthopedics;  Laterality: Right;   TOTAL HIP REVISION Left 06/27/2012   Procedure: LEFT TOTAL HIP REVISION;  Surgeon: Gearlean Alf, MD;  Location: [facility];  Service: Orthopedics;  Laterality: Left;    Current Outpatient Medications  Medication Sig Dispense Refill   acetaminophen (TYLENOL) 500 MG tablet Take 500-1,000 mg by mouth See admin instructions. Take 2 tablets (1000 mg) by mouth daily at bedtime, may also take 1 tablet (500 mg) in the morning as needed for pain     ALPRAZolam (XANAX) 0.25 MG tablet Take 1/2 to 1 tablet once daily as needed for panic attacks. 6 tablet 0   amLODipine (NORVASC) 5 MG tablet Take 1.5 tablets (7.5 mg total) by mouth daily. 30 tablet 6   Coenzyme Q10 (COQ-10 PO) Take 1 capsule by mouth daily with breakfast.     dofetilide (TIKOSYN) 250 MCG capsule Take 1 capsule (250 mcg total) by mouth 2 (two) times daily. 180 capsule 0   ELIQUIS 2.5 MG TABS tablet TAKE 1 TABLET(2.5 MG) BY MOUTH TWICE DAILY 180 tablet 1   levothyroxine (SYNTHROID) 112 MCG tablet Take 1 tablet (112 mcg total) by mouth daily. 90 tablet 0   Magnesium Oxide 200 MG TABS Take 1 tablet (200 mg total) by mouth daily. 30 tablet 5   Multiple Vitamins-Iron (MULTIVITAMINS WITH IRON) TABS tablet Take 1  tablet by mouth daily with breakfast.     Omega-3 Fatty Acids (FISH OIL PO) Take 1 capsule by mouth in the morning and at bedtime.     predniSONE (DELTASONE) 1 MG tablet Take 1 mg by mouth daily with breakfast. Take with a 5 mg tablet for a total dose of 6 mg     predniSONE (DELTASONE) 10 MG tablet Take 1 tablet (10 mg total) by mouth daily with breakfast. 30 tablet 0   predniSONE (DELTASONE) 5 MG tablet Take 5 mg by mouth daily. Take with a 1 mg tablet for a total dose of 6 mg     Propylene Glycol (  SYSTANE BALANCE OP) Place 1 drop into both eyes in the morning and at bedtime.     Vitamin D, Cholecalciferol, 1000 UNITS TABS Take 1,000 Units by mouth daily with breakfast.     No current facility-administered medications for this encounter.    Allergies  Allergen Reactions   Lactose Intolerance (Gi) Other (See Comments)    Bloating, gas   Epinephrine Other (See Comments)     Reaction:  Caused pt to scream uncontrollably.    Social History   Socioeconomic History   Marital status: Single    Spouse name: Not on file   Number of children: 1   Years of education: Not on file   Highest education level: Not on file  Occupational History   Occupation: Retired  Tobacco Use   Smoking status: Former    Packs/day: 1.00    Years: 10.00    Pack years: 10.00    Types: Cigarettes    Quit date: 05/09/1963    Years since quitting: 57.5   Smokeless tobacco: Never  Vaping Use   Vaping Use: Never used  Substance and Sexual Activity   Alcohol use: Yes    Alcohol/week: 1.0 standard drink    Types: 1 Glasses of wine per week    Comment: very little   Drug use: No   Sexual activity: Never  Other Topics Concern   Not on file  Social History Narrative   Lives alone.   Social Determinants of Health   Financial Resource Strain: Not on file  Food Insecurity: Not on file  Transportation Needs: Not on file  Physical Activity: Not on file  Stress: Not on file  Social Connections: Not on file   Intimate Partner Violence: Not on file     ROS- All systems are reviewed and negative except as per the HPI above.  Physical Exam: Vitals:   11/01/20 1530  BP: (!) 164/72  Pulse: (!) 52  Weight: 55 kg  Height: 5\' 5"  (1.651 m)    GEN- The patient is a well appearing elderly female, alert and oriented x 3 today.   HEENT-head normocephalic, atraumatic, sclera clear, conjunctiva pink, hearing intact, trachea midline. Lungs- Clear to ausculation bilaterally, normal work of breathing Heart- Regular rate and rhythm, no murmurs, rubs or gallops  GI- soft, NT, ND, + BS Extremities- no clubbing, cyanosis, or edema MS- no significant deformity or atrophy Skin- no rash or lesion Psych- euthymic mood, full affect Neuro- strength and sensation are intact   Wt Readings from Last 3 Encounters:  11/01/20 55 kg  10/18/20 54.3 kg  10/18/20 54.2 kg    EKG today demonstrates  SB, NST Vent. rate 52 BPM PR interval 154 ms QRS duration 76 ms QT/QTcB 452/420 ms  Echo 07/29/20 demonstrated  1. Left ventricular ejection fraction, by estimation, is 60 to 65%. The  left ventricle has normal function. The left ventricle has no regional  wall motion abnormalities. The average left ventricular global  longitudinal strain is -17.1 % (appears  underestimated). The global longitudinal strain is probably low normal.  Left ventricular diastolic parameters are consistent with Grade II  diastolic dysfunction (pseudonormalization). Elevated left atrial  pressure.   2. Right ventricular systolic function is normal. The right ventricular  size is normal. There is mildly elevated pulmonary artery systolic  pressure. The estimated right ventricular systolic pressure is 06.2 mmHg.   3. Left atrial size was moderately dilated.   4. The mitral valve is myxomatous. Mild mitral valve regurgitation.  No  evidence of mitral stenosis.   5. The aortic valve is tricuspid. Aortic valve regurgitation is mild. No   aortic stenosis is present.   6. The inferior vena cava is dilated in size with >50% respiratory  variability, suggesting right atrial pressure of 8 mmHg.   7. Tricuspid valve regurgitation is mild to moderate.   8. Right atrial size was mildly dilated.   Comparison(s): Prior images unable to be directly viewed, comparison made  by report only. 03/10/14 EF 60-65%. PA pressure 12mmHg.  Epic records are reviewed at length today  CHA2DS2-VASc Score = 6  The patient's score is based upon: CHF History: No HTN History: Yes Diabetes History: No Stroke History: Yes Vascular Disease History: No Age Score: 2 Gender Score: 1      ASSESSMENT AND PLAN: 1. Paroxysmal Atrial Fibrillation (ICD10:  I48.0) The patient's CHA2DS2-VASc score is 6, indicating a 9.7% annual risk of stroke.   S/p dofetilide admission 6/13-6/16/22 Patient appears to be maintaining SR. Continue dofetilide 250 mcg BID. QT stable.  Continue Eliquis 2.5 mg BID Check bmet/mag today.  2. Secondary Hypercoagulable State (ICD10:  D68.69) The patient is at significant risk for stroke/thromboembolism based upon her CHA2DS2-VASc Score of 6.  Continue Apixaban (Eliquis).   3. HTN Borderline elevated today. Readings at home are better. No change today.  4. Post termination pauses Patient has declined PPM.   Follow up with Dr Caryl Comes as scheduled.    [facility] [address] [place], [place] [address] [phone] 11/01/2020 4:36 PM

## 2020-11-24 ENCOUNTER — Ambulatory Visit: Payer: Medicare PPO | Admitting: Internal Medicine

## 2020-12-06 DIAGNOSIS — Z1231 Encounter for screening mammogram for malignant neoplasm of breast: Secondary | ICD-10-CM | POA: Diagnosis not present

## 2020-12-06 LAB — HM MAMMOGRAPHY

## 2020-12-08 ENCOUNTER — Telehealth: Payer: Self-pay | Admitting: Family Medicine

## 2020-12-08 DIAGNOSIS — E039 Hypothyroidism, unspecified: Secondary | ICD-10-CM

## 2020-12-08 MED ORDER — LEVOTHYROXINE SODIUM 112 MCG PO TABS: 112.0000 ug | ORAL_TABLET | Freq: Every day | ORAL | 0 refills | Status: DC

## 2020-12-08 NOTE — Telephone Encounter (Signed)
refilled 

## 2020-12-08 NOTE — Telephone Encounter (Signed)
Fax from [facility]  Levothyroxine .112 mcg  #90 Last filled 08/30/20

## 2020-12-10 ENCOUNTER — Encounter: Payer: Self-pay | Admitting: Internal Medicine

## 2020-12-28 ENCOUNTER — Other Ambulatory Visit: Payer: Self-pay | Admitting: *Deleted

## 2020-12-28 ENCOUNTER — Ambulatory Visit: Payer: Medicare PPO | Admitting: Internal Medicine

## 2020-12-28 MED ORDER — DOFETILIDE 250 MCG PO CAPS: 250.0000 ug | ORAL_CAPSULE | Freq: Two times a day (BID) | ORAL | 1 refills | Status: DC

## 2021-01-04 DIAGNOSIS — H26493 Other secondary cataract, bilateral: Secondary | ICD-10-CM | POA: Diagnosis not present

## 2021-01-04 DIAGNOSIS — H04123 Dry eye syndrome of bilateral lacrimal glands: Secondary | ICD-10-CM | POA: Diagnosis not present

## 2021-01-04 DIAGNOSIS — Z961 Presence of intraocular lens: Secondary | ICD-10-CM | POA: Diagnosis not present

## 2021-01-04 DIAGNOSIS — H18513 Endothelial corneal dystrophy, bilateral: Secondary | ICD-10-CM | POA: Diagnosis not present

## 2021-01-04 DIAGNOSIS — H02834 Dermatochalasis of left upper eyelid: Secondary | ICD-10-CM | POA: Diagnosis not present

## 2021-01-04 DIAGNOSIS — H02831 Dermatochalasis of right upper eyelid: Secondary | ICD-10-CM | POA: Diagnosis not present

## 2021-01-04 DIAGNOSIS — H43813 Vitreous degeneration, bilateral: Secondary | ICD-10-CM | POA: Diagnosis not present

## 2021-01-13 DIAGNOSIS — Z6820 Body mass index (BMI) 20.0-20.9, adult: Secondary | ICD-10-CM | POA: Diagnosis not present

## 2021-01-13 DIAGNOSIS — M353 Polymyalgia rheumatica: Secondary | ICD-10-CM | POA: Diagnosis not present

## 2021-01-13 DIAGNOSIS — R5382 Chronic fatigue, unspecified: Secondary | ICD-10-CM | POA: Diagnosis not present

## 2021-01-13 DIAGNOSIS — M2550 Pain in unspecified joint: Secondary | ICD-10-CM | POA: Diagnosis not present

## 2021-01-17 ENCOUNTER — Encounter: Payer: Self-pay | Admitting: Family Medicine

## 2021-01-25 DIAGNOSIS — D224 Melanocytic nevi of scalp and neck: Secondary | ICD-10-CM | POA: Diagnosis not present

## 2021-01-25 DIAGNOSIS — L570 Actinic keratosis: Secondary | ICD-10-CM | POA: Diagnosis not present

## 2021-01-25 DIAGNOSIS — Z85828 Personal history of other malignant neoplasm of skin: Secondary | ICD-10-CM | POA: Diagnosis not present

## 2021-01-25 DIAGNOSIS — Z808 Family history of malignant neoplasm of other organs or systems: Secondary | ICD-10-CM | POA: Diagnosis not present

## 2021-01-25 DIAGNOSIS — L578 Other skin changes due to chronic exposure to nonionizing radiation: Secondary | ICD-10-CM | POA: Diagnosis not present

## 2021-01-25 DIAGNOSIS — Z87898 Personal history of other specified conditions: Secondary | ICD-10-CM | POA: Diagnosis not present

## 2021-01-25 DIAGNOSIS — Z86018 Personal history of other benign neoplasm: Secondary | ICD-10-CM | POA: Diagnosis not present

## 2021-01-25 DIAGNOSIS — D2371 Other benign neoplasm of skin of right lower limb, including hip: Secondary | ICD-10-CM | POA: Diagnosis not present

## 2021-01-25 DIAGNOSIS — D225 Melanocytic nevi of trunk: Secondary | ICD-10-CM | POA: Diagnosis not present

## 2021-01-26 NOTE — Progress Notes (Signed)
[name] is a 87 y.o. female who presents for annual wellness visit and follow-up on chronic medical conditions.  She has the following concerns:  Left hand tremor for over a year and worsening. Only with action and not at rest. No tremor on right, legs or face.  States her toes feel like she has socks on them but she doesn't.   Bilateral leg cramps at night. She stands up or shifts her position and they resolve.   Left ankle with mild intermittent swelling.   Feels hot at times. This is new since starting on Tikosyn.  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 Cannot take NSAIDs since on Eliquis.   Feels like she is losing weight.  Eating well. 3 meals.   She is on Eliquis due to Afib and hx of CVA, no deficits.   Seeing rheumatologist for PMR. Weaning off steroids, doing much better.   Hypothyroidism- taking levothyroxine   HTN- taking 7.5 mg of amlodipine. No concerns with BP.   HL- declines statin, is taking fish oil, healthy fats in her diet, regular exercise.   Osteopenia- taking vitamin D, weight bearing exercises, calcium in diet    Immunization History  Administered Date(s) Administered   Fluad Quad(high Dose 65+) 01/26/2020   Gamma Globulin 10/09/1983, 12/18/1984, 07/24/1989   Hepatitis A 10/19/1995   Hepatitis B 06/08/2009   IPV 12/07/1969, 12/19/1983, 11/25/1984, 01/08/1986   Influenza, High Dose Seasonal PF 02/07/2017, 01/28/2018, 12/27/2018   Influenza, Seasonal, Injecte, Preservative Fre 01/19/2014   Influenza-Unspecified 01/19/2015, 12/30/2018, 01/05/2021   OPV 10/19/1995   PFIZER(Purple Top)SARS-COV-2 Vaccination 06/14/2019, 07/09/2019, 08/11/2020, 01/14/2021   Pneumococcal Conjugate-13 02/09/2015   Pneumococcal Polysaccharide-23 07/06/2001, 01/14/2019   Td 11/13/1978, 01/04/1989, 10/19/1995, 06/08/2009   Tdap 08/26/2010   Typhoid Inactivated 10/09/1983, 11/01/1989, 11/11/2002   Typhoid Live 12/09/1995, 06/08/2009   Yellow Fever 08/26/2010    Zoster Recombinat (Shingrix) 09/15/2016, 12/20/2016   Zoster, Live 09/12/2006   Last Pap smear: Hysterectomy Last mammogram: 12/06/20 Last colonoscopy: 07/02/2003 Last DEXA: 11/06/2017 at [place], overdue  Dentist: 01/04/21 Ophtho: Dr.Groat  Exercise: Everyday  Other doctors caring for patient include: Dr. Delman Cheadle- Dermatology Dr. Lenore Cordia - Dentist Dr. Caryl Comes - Cardiology Leafy Kindle PA - Dermatology   Depression screen:  See questionnaire below.  Depression screen [facility] 2/9 01/27/2021 10/07/2020 08/26/2020 01/26/2020 07/24/2019  Decreased Interest 0 0 0 0 0  Down, Depressed, Hopeless 0 0 0 0 0  PHQ - 2 Score 0 0 0 0 0  Some recent data might be hidden    Fall Risk Screen: see questionnaire below. Fall Risk  01/27/2021 10/07/2020 08/26/2020 01/26/2020 07/24/2019  Falls in the past year? 1 1 1 1 1   Number falls in past yr: 0 1 0 1 1  Comment 1 - - - -  Injury with Fall? 1 1 1 1 1   Comment bruised arm - - - -  Risk for fall due to : History of fall(s);Impaired vision;Impaired mobility Medication side effect Other (Comment) - -  Risk for fall due to: Comment - - A-fib episode - -  Follow up Falls evaluation completed Falls evaluation completed Falls evaluation completed - -    ADL screen:  See questionnaire below Functional Status Survey: Is the patient deaf or have difficulty hearing?: Yes Does the patient have difficulty seeing, even when wearing glasses/contacts?: No Does the patient have difficulty concentrating, remembering, or making decisions?: Yes Does the patient have difficulty walking or climbing stairs?: Yes Does the patient have difficulty dressing  or bathing?: No Does the patient have difficulty doing errands alone such as visiting a doctor's office or shopping?: No   End of Life Discussion:  Patient does not have a living will and medical power of attorney. Paperwork discussed and provided. MOST form reviewed and signed. She is a full code.   Review of Systems Constitutional:  -fever, -chills, -sweats, -unexpected weight change, -anorexia, -fatigue Allergy: -sneezing, -itching, -congestion Dermatology: denies changing moles, rash, lumps, new worrisome lesions ENT: -runny nose, -ear pain, -sore throat, -hoarseness, -sinus pain, -teeth pain, -tinnitus, -hearing loss, -epistaxis Cardiology:  -chest pain, -palpitations, -edema, -orthopnea, -paroxysmal nocturnal dyspnea Respiratory: -cough, -shortness of breath, -dyspnea on exertion, -wheezing, -hemoptysis Gastroenterology: -abdominal pain, -nausea, -vomiting, -diarrhea, -constipation, -blood in stool, -changes in bowel movement, -dysphagia Hematology: -bleeding or bruising problems Musculoskeletal: +arthralgias, -myalgias, -joint swelling, -back pain, -neck pain, +cramping, -gait changes Ophthalmology: -vision changes, -eye redness, -itching, -discharge Urology: -dysuria, -difficulty urinating, -hematuria, -urinary frequency, -urgency, -incontinence Neurology: -headache, -weakness, -tingling, -numbness, -speech abnormality, -memory loss, -falls, -dizziness Psychology:  -depressed mood, -agitation, -sleep problems    PHYSICAL EXAM:  BP 108/60   Pulse 72   Ht 5\' 4"  (1.626 m)   Wt 119 lb 12.8 oz (54.3 kg)   BMI 20.56 kg/m   General Appearance: Alert, cooperative, no distress, appears stated age Head: Normocephalic, without obvious abnormality, atraumatic  Extremities: No clubbing, cyanosis or edema Action tremor left hand.  Psych: Normal mood, affect, hygiene and grooming.  ASSESSMENT/PLAN: Medicare annual wellness visit, subsequent -Denies concerns with memory, ADLs, mood and no falls.  Advanced directive counseling done.  Immunizations reviewed.  Tremor of left hand - Plan: Ambulatory referral to Neurology, CBC with Differential/Platelet, Comprehensive metabolic panel, TSH, T4, free -worsening tremor. Referral to neurologist   Neuropathy - Plan: Ambulatory referral to Neurology, Vitamin B12 -referral to  neurology  Osteopenia, unspecified location -will send order for DEXA to [facility] since she has had them there in the past.  Continue calcium in diet, vitamin D supplement and exercise  Estrogen deficiency  Vitamin D deficiency - Plan: VITAMIN D 25 Hydroxy (Vit-D Deficiency, Fractures) -continue supplement.   Primary hypertension - Plan: CBC with Differential/Platelet, Comprehensive metabolic panel, POCT URINALYSIS DIP (CLINITEK) -continue amlodipine   Hypothyroidism, unspecified type - Plan: TSH, T4, free -follow up pending thyroid results. Continue levothyroxine   Aortic atherosclerosis (HCC) - Plan: Lipid panel -declines statin. Continue with healthy fats in diet and fish oil   Dyslipidemia - Plan: Lipid panel  Statin declined  Chronic anticoagulation -no sign of bleeding. Continue Eliquis   Left wrist pain - Plan: diclofenac Sodium (VOLTAREN) 1 % GEL -use PRN, she cannot take oral NSAIDs    Discussed monthly self breast exams and yearly mammograms; at least 30 minutes of aerobic activity at least 5 days/week and weight-bearing exercise 2x/week; proper sunscreen use reviewed; healthy diet, including goals of calcium and vitamin D intake and alcohol recommendations (less than or equal to 1 drink/day) reviewed; regular seatbelt use; changing batteries in smoke detectors.  Immunization recommendations discussed.  Colonoscopy recommendations reviewed   Medicare Attestation I have personally reviewed: The patient's medical and social history Their use of alcohol, tobacco or illicit drugs Their current medications and supplements The patient's functional ability including ADLs,fall risks, home safety risks, cognitive, and hearing and visual impairment Diet and physical activities Evidence for depression or mood disorders  The patient's weight, height, and BMI have been recorded in the chart.  I have made referrals, counseling, and provided education  to the patient based on  review of the above and I have provided the patient with a written personalized care plan for preventive services.     Harland Dingwall, NP-C   01/27/2021

## 2021-01-27 ENCOUNTER — Other Ambulatory Visit: Payer: Self-pay

## 2021-01-27 ENCOUNTER — Ambulatory Visit: Payer: Medicare PPO | Admitting: Family Medicine

## 2021-01-27 ENCOUNTER — Telehealth: Payer: Self-pay

## 2021-01-27 ENCOUNTER — Encounter: Payer: Self-pay | Admitting: Family Medicine

## 2021-01-27 VITALS — BP 108/60 | HR 72 | Ht 64.0 in | Wt 119.8 lb

## 2021-01-27 DIAGNOSIS — E039 Hypothyroidism, unspecified: Secondary | ICD-10-CM

## 2021-01-27 DIAGNOSIS — E785 Hyperlipidemia, unspecified: Secondary | ICD-10-CM | POA: Diagnosis not present

## 2021-01-27 DIAGNOSIS — E559 Vitamin D deficiency, unspecified: Secondary | ICD-10-CM

## 2021-01-27 DIAGNOSIS — M8580 Other specified disorders of bone density and structure, unspecified site: Secondary | ICD-10-CM | POA: Diagnosis not present

## 2021-01-27 DIAGNOSIS — Z7901 Long term (current) use of anticoagulants: Secondary | ICD-10-CM | POA: Insufficient documentation

## 2021-01-27 DIAGNOSIS — G629 Polyneuropathy, unspecified: Secondary | ICD-10-CM | POA: Diagnosis not present

## 2021-01-27 DIAGNOSIS — Z0000 Encounter for general adult medical examination without abnormal findings: Secondary | ICD-10-CM | POA: Diagnosis not present

## 2021-01-27 DIAGNOSIS — R251 Tremor, unspecified: Secondary | ICD-10-CM | POA: Diagnosis not present

## 2021-01-27 DIAGNOSIS — I10 Essential (primary) hypertension: Secondary | ICD-10-CM

## 2021-01-27 DIAGNOSIS — I700 Atherosclerosis of aorta: Secondary | ICD-10-CM

## 2021-01-27 DIAGNOSIS — E2839 Other primary ovarian failure: Secondary | ICD-10-CM | POA: Diagnosis not present

## 2021-01-27 DIAGNOSIS — Z5320 Procedure and treatment not carried out because of patient's decision for unspecified reasons: Secondary | ICD-10-CM

## 2021-01-27 DIAGNOSIS — M25532 Pain in left wrist: Secondary | ICD-10-CM

## 2021-01-27 LAB — POCT URINALYSIS DIP (CLINITEK)
Bilirubin, UA: NEGATIVE
Blood, UA: NEGATIVE
Glucose, UA: NEGATIVE mg/dL
Ketones, POC UA: NEGATIVE mg/dL
Leukocytes, UA: NEGATIVE
Nitrite, UA: NEGATIVE
POC PROTEIN,UA: NEGATIVE
Spec Grav, UA: 1.01 (ref 1.010–1.025)
Urobilinogen, UA: 0.2 E.U./dL
pH, UA: 7.5 (ref 5.0–8.0)

## 2021-01-27 MED ORDER — DICLOFENAC SODIUM 1 % EX GEL
2.0000 g | Freq: Four times a day (QID) | CUTANEOUS | 1 refills | Status: DC
Start: 2021-01-27 — End: 2021-08-04

## 2021-01-27 MED ORDER — APIXABAN 2.5 MG PO TABS: ORAL_TABLET | ORAL | 1 refills | Status: DC

## 2021-01-27 NOTE — Telephone Encounter (Signed)
Eliquis refilled per my chart message

## 2021-01-27 NOTE — Patient Instructions (Addendum)
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   I am referring you to [facility] neurology and they will call you to schedule.   Check with pharmacy regarding Tdap (tetanus, diptheria and pertussis)   [name] , Thank you for taking time to come for your Medicare Wellness Visit. I appreciate your ongoing commitment to your health goals. Please review the following plan we discussed and let me know if I can assist you in the future.    This is a list of the screening recommended for you and due dates:  Health Maintenance  Topic Date Due   Tetanus Vaccine  08/25/2020   COVID-19 Vaccine (5 - Booster for Pfizer series) 05/16/2021   Flu Shot  Completed   DEXA scan (bone density measurement)  Completed   Zoster (Shingles) Vaccine  Completed   HPV Vaccine  Aged Out

## 2021-01-28 ENCOUNTER — Encounter: Payer: Self-pay | Admitting: Family Medicine

## 2021-01-28 ENCOUNTER — Other Ambulatory Visit: Payer: Self-pay

## 2021-01-28 ENCOUNTER — Other Ambulatory Visit: Payer: Self-pay | Admitting: Family Medicine

## 2021-01-28 DIAGNOSIS — E039 Hypothyroidism, unspecified: Secondary | ICD-10-CM

## 2021-01-28 LAB — VITAMIN D 25 HYDROXY (VIT D DEFICIENCY, FRACTURES): Vit D, 25-Hydroxy: 57.3 ng/mL (ref 30.0–100.0)

## 2021-01-28 LAB — COMPREHENSIVE METABOLIC PANEL
ALT: 14 IU/L (ref 0–32)
AST: 17 IU/L (ref 0–40)
Albumin/Globulin Ratio: 1.8 (ref 1.2–2.2)
Albumin: 4.6 g/dL (ref 3.6–4.6)
Alkaline Phosphatase: 72 IU/L (ref 44–121)
BUN/Creatinine Ratio: 19 (ref 12–28)
BUN: 13 mg/dL (ref 8–27)
Bilirubin Total: 0.6 mg/dL (ref 0.0–1.2)
CO2: 25 mmol/L (ref 20–29)
Calcium: 9.5 mg/dL (ref 8.7–10.3)
Chloride: 100 mmol/L (ref 96–106)
Creatinine, Ser: 0.68 mg/dL (ref 0.57–1.00)
Globulin, Total: 2.5 g/dL (ref 1.5–4.5)
Glucose: 90 mg/dL (ref 65–99)
Potassium: 4.9 mmol/L (ref 3.5–5.2)
Sodium: 139 mmol/L (ref 134–144)
Total Protein: 7.1 g/dL (ref 6.0–8.5)
eGFR: 84 mL/min/{1.73_m2} (ref >59)

## 2021-01-28 LAB — CBC WITH DIFFERENTIAL/PLATELET
Basophils Absolute: 0.1 10*3/uL (ref 0.0–0.2)
Basos: 1 %
EOS (ABSOLUTE): 0 10*3/uL (ref 0.0–0.4)
Eos: 0 %
Hematocrit: 38.8 % (ref 34.0–46.6)
Hemoglobin: 12.7 g/dL (ref 11.1–15.9)
Immature Grans (Abs): 0 10*3/uL (ref 0.0–0.1)
Immature Granulocytes: 0 %
Lymphocytes Absolute: 1.5 10*3/uL (ref 0.7–3.1)
Lymphs: 15 %
MCH: 30.9 pg (ref 26.6–33.0)
MCHC: 32.7 g/dL (ref 31.5–35.7)
MCV: 94 fL (ref 79–97)
Monocytes Absolute: 0.7 10*3/uL (ref 0.1–0.9)
Monocytes: 7 %
Neutrophils Absolute: 7.7 10*3/uL — ABNORMAL HIGH (ref 1.4–7.0)
Neutrophils: 77 %
Platelets: 220 10*3/uL (ref 150–450)
RBC: 4.11 x10E6/uL (ref 3.77–5.28)
RDW: 13.8 % (ref 11.7–15.4)
WBC: 10.1 10*3/uL (ref 3.4–10.8)

## 2021-01-28 LAB — LIPID PANEL
Chol/HDL Ratio: 2.6 ratio (ref 0.0–4.4)
Cholesterol, Total: 220 mg/dL — ABNORMAL HIGH (ref 100–199)
HDL: 86 mg/dL (ref >39)
LDL Chol Calc (NIH): 122 mg/dL — ABNORMAL HIGH (ref 0–99)
Triglycerides: 70 mg/dL (ref 0–149)
VLDL Cholesterol Cal: 12 mg/dL (ref 5–40)

## 2021-01-28 LAB — VITAMIN B12: Vitamin B-12: 722 pg/mL (ref 232–1245)

## 2021-01-28 LAB — T4, FREE: Free T4: 1.8 ng/dL — ABNORMAL HIGH (ref 0.82–1.77)

## 2021-01-28 LAB — TSH: TSH: 0.447 u[IU]/mL — ABNORMAL LOW (ref 0.450–4.500)

## 2021-01-28 MED ORDER — LEVOTHYROXINE SODIUM 100 MCG PO TABS: 100.0000 ug | ORAL_TABLET | Freq: Every day | ORAL | 3 refills | Status: DC

## 2021-01-28 NOTE — Progress Notes (Signed)
Future TSH ordered for 1 month to recheck value per [facility]

## 2021-01-28 NOTE — Telephone Encounter (Signed)
Lab appointment scheduled for 02/24/21

## 2021-01-28 NOTE — Progress Notes (Signed)
Needs a 4 week lab visit to recheck TSH after lowering her levothyroxine. I sent the new dose to her pharmacy and sent her a mychart message.

## 2021-02-04 DIAGNOSIS — Z780 Asymptomatic menopausal state: Secondary | ICD-10-CM | POA: Diagnosis not present

## 2021-02-04 DIAGNOSIS — M8589 Other specified disorders of bone density and structure, multiple sites: Secondary | ICD-10-CM | POA: Diagnosis not present

## 2021-02-16 ENCOUNTER — Telehealth: Payer: Self-pay

## 2021-02-16 MED ORDER — AMLODIPINE BESYLATE 5 MG PO TABS: 7.5000 mg | ORAL_TABLET | Freq: Every day | ORAL | 1 refills | Status: DC

## 2021-02-16 NOTE — Telephone Encounter (Signed)
Pt left message that she takes amlodipine 7.5 not 5mg  and last refill that was sent in was sent in wrong and needs this sent in correctly to [facility] for 90 days

## 2021-02-24 ENCOUNTER — Other Ambulatory Visit: Payer: Medicare PPO

## 2021-02-24 ENCOUNTER — Other Ambulatory Visit: Payer: Self-pay

## 2021-02-24 DIAGNOSIS — E039 Hypothyroidism, unspecified: Secondary | ICD-10-CM

## 2021-02-25 LAB — TSH: TSH: 1.77 u[IU]/mL (ref 0.450–4.500)

## 2021-04-21 ENCOUNTER — Ambulatory Visit: Payer: Medicare PPO | Admitting: Neurology

## 2021-04-21 ENCOUNTER — Encounter: Payer: Self-pay | Admitting: Neurology

## 2021-04-21 VITALS — BP 160/76 | HR 53 | Ht 64.0 in | Wt 119.0 lb

## 2021-04-21 DIAGNOSIS — G3184 Mild cognitive impairment, so stated: Secondary | ICD-10-CM

## 2021-04-21 DIAGNOSIS — R413 Other amnesia: Secondary | ICD-10-CM

## 2021-04-21 DIAGNOSIS — R251 Tremor, unspecified: Secondary | ICD-10-CM

## 2021-04-21 NOTE — Patient Instructions (Signed)
I had a long discussion with patient with regards to her mild left upper extremity action tremor which likely represents benign essential tremor given her family history of the same.  The tremor does not appear to be functionally disabling at the present time hence we will hold off on medications like Mysoline, Inderal or Topamax at the present time.  I recommend further evaluation with checking MRI scan of the brain to look for interval new strokes as well as check memory panel labs.  He is also discussed memory compensation strategies and encouraged her to increase participation in cognitively challenging activities like solving crossword puzzles, playing bridge and sodoku.  Continue Eliquis for secondary stroke prevention given history of A. fib and maintain aggressive risk factor modification with strict control of hypertension with blood pressure goal below 130/90, lipids with LDL cholesterol goal below 70 mg percent and diabetes with hemoglobin A1c goal below 6.5%.  She will return for follow-up in the future in 3 months or call earlier if necessary. Memory Compensation Strategies  Use "WARM" strategy.  W= write it down  A= associate it  R= repeat it  M= make a mental note  2.   You can keep a Social worker.  Use a 3-ring notebook with sections for the following: calendar, important names and phone numbers,  medications, doctors' names/phone numbers, lists/reminders, and a section to journal what you did  each day.   3.    Use a calendar to write appointments down.  4.    Write yourself a schedule for the day.  This can be placed on the calendar or in a separate section of the Memory Notebook.  Keeping a  regular schedule can help memory.  5.    Use medication organizer with sections for each day or morning/evening pills.  You may need help loading it  6.    Keep a basket, or pegboard by the door.  Place items that you need to take out with you in the basket or on the pegboard.  You may  also want to  include a message board for reminders.  7.    Use sticky notes.  Place sticky notes with reminders in a place where the task is performed.  For example: " turn off the  stove" placed by the stove, "lock the door" placed on the door at eye level, " take your medications" on  the bathroom mirror or by the place where you normally take your medications.  8.    Use alarms/timers.  Use while cooking to remind yourself to check on food or as a reminder to take your medicine, or as a  reminder to make a call, or as a reminder to perform another task, etc.

## 2021-04-21 NOTE — Progress Notes (Signed)
[facility] [address] [place]. [place] [address] [phone]       OFFICE CONSULT NOTE  [name] Date of Birth:  [birth year] Medical Record Number:  [id number]   Referring MD: Raynald Blend, PA-C  Reason for Referral: Tremor  HPI: [name] is 87 year old pleasant Caucasian lady seen today for office consultation visit for tremor.  History is obtained from the patient and review of electronic medical records and I am reviewed available pertinent imaging films in PACS.  She has past medical history of cryptogenic large left basal ganglia infarct and December 2015 for which she underwent extensive work-up including loop recorder and subsequently years later she was found to have A. fib long-term anticoagulation.  She was seen by me for follow-up last time in 2016 and lost to follow-up.  Patient states that for the last 2 years or so she has noticed some intermittent tremor in the left hand.  Tremor is absent at rest present mostly when she is putting some weight on the hand and using it for some activities.  This is noticeable and annoying but not incapacitating.  She correctly states she does not want to take medications for this but just wants to find out possible etiology.  The tremor is noticeable when she is holding a book or a cup in her hand but she does not spill coffee or water.  She has not noticed a tremor on the right side or in the legs or voice or head.  She does admit that her brother had a similar tremor at her age.  He was never formally diagnosed with with benign essential tremor.  The patient does not drink alcohol and is unable to tell me if that has any effect on the tremor.  She is also concerned about her short-term memory which seems to be getting worse over the years.  She has trouble at times remembering recent information as well as the names of family and friends she may remember them later.  He is quite independent and manages all her  affairs.  She denies any significant headaches, or other strokelike symptoms.  She had a stroke in 2015 from which she has recovered fairly well.  He has no lasting residual deficits from a stroke.  She denies other symptoms like drooling of saliva, bradykinesia, balance problems difficulty with gait initiation or freezing of legs, stooped posture.  Family history of Parkinson's disease.  No family history of dementia either.  ROS:   14 system review of systems is positive for tremor, memory difficulties, word finding difficulties, leg pain, walking difficulty all other systems negative  PMH:  Past Medical History:  Diagnosis Date   Anemia    hx of with surgery    Arthritis 07-27-11   osteoarthritis-hips/s/p bil. THA, arthritis right hand    Blepharitis of left eye    Blood transfusion feb 2014   post surgery some time ago   Cancer [facility]) 1989   Breast cancer -s/p lt. mastectomy, some squamous cell lesions   Cold hands    Complication of anesthesia    epinephrine - screams uncontrollably / pt does not want general anesthesia or narcotics   Constipation    Fuchs' corneal dystrophy    both eyes   Hard of hearing    both ears mild loss   Hyperkalemia 03/07/2018   Hypertension    Hypothyroidism 3-21- 13   tx. levothyroxine   Lichen sclerosus et atrophicus of the vulva  pt reported.    Osteopenia    Stroke [facility])    a. s/p MDT LINQ 04/2014    Social History:  Social History   Socioeconomic History   Marital status: Single    Spouse name: Not on file   Number of children: 1   Years of education: Not on file   Highest education level: Not on file  Occupational History   Occupation: Retired  Tobacco Use   Smoking status: Former    Packs/day: 1.00    Years: 10.00    Pack years: 10.00    Types: Cigarettes    Quit date: 05/09/1963    Years since quitting: 57.9   Smokeless tobacco: Never  Vaping Use   Vaping Use: Never used  Substance and Sexual Activity   Alcohol use: Yes     Alcohol/week: 1.0 standard drink    Types: 1 Glasses of wine per week    Comment: very little   Drug use: No   Sexual activity: Never  Other Topics Concern   Not on file  Social History Narrative   Lives alone.   Social Determinants of Health   Financial Resource Strain: Not on file  Food Insecurity: Not on file  Transportation Needs: Not on file  Physical Activity: Not on file  Stress: Not on file  Social Connections: Not on file  Intimate Partner Violence: Not on file    Medications:   Current Outpatient Medications on File Prior to Visit  Medication Sig Dispense Refill   acetaminophen (TYLENOL) 500 MG tablet Take 500-1,000 mg by mouth See admin instructions. Take 2 tablets (1000 mg) by mouth daily at bedtime, may also take 1 tablet (500 mg) in the morning as needed for pain     amLODipine (NORVASC) 5 MG tablet Take 1.5 tablets (7.5 mg total) by mouth daily. 135 tablet 1   apixaban (ELIQUIS) 2.5 MG TABS tablet TAKE 1 TABLET(2.5 MG) BY MOUTH TWICE DAILY 180 tablet 1   Coenzyme Q10 (COQ-10 PO) Take 1 capsule by mouth daily with breakfast.     diclofenac Sodium (VOLTAREN) 1 % GEL Apply 2 g topically 4 (four) times daily. 100 g 1   dofetilide (TIKOSYN) 250 MCG capsule Take 1 capsule (250 mcg total) by mouth 2 (two) times daily. 180 capsule 1   levothyroxine (SYNTHROID) 100 MCG tablet Take 1 tablet (100 mcg total) by mouth daily. 30 tablet 3   Magnesium Oxide 200 MG TABS Take 1 tablet (200 mg total) by mouth daily. 30 tablet 5   Multiple Vitamins-Iron (MULTIVITAMINS WITH IRON) TABS tablet Take 1 tablet by mouth daily with breakfast.     Omega-3 Fatty Acids (FISH OIL PO) Take 1 capsule by mouth in the morning and at bedtime.     Propylene Glycol (SYSTANE BALANCE OP) Place 1 drop into both eyes in the morning and at bedtime.     Vitamin D, Cholecalciferol, 1000 UNITS TABS Take 1,000 Units by mouth daily with breakfast.     No current facility-administered medications on file prior  to visit.    Allergies:   Allergies  Allergen Reactions   Lactose Intolerance (Gi) Other (See Comments)    Bloating, gas   Epinephrine Other (See Comments)     Reaction:  Caused pt to scream uncontrollably.    Physical Exam General: Frail elderly Caucasian lady., seated, in no evident distress Head: head normocephalic and atraumatic.   Neck: supple with no carotid or supraclavicular bruits Cardiovascular: regular rate and rhythm, no  murmurs Musculoskeletal: Mild kyphoscoliosis. Skin:  no rash/petichiae Vascular:  Normal pulses all extremities  Neurologic Exam Mental Status: Awake and fully alert. Oriented to place and time. Recent and remote memory intact. Attention span, concentration and fund of knowledge appropriate. Mood and affect appropriate.  Recall 3/3.  Able to name 13 animals which can walk on 4 legs.  Clock drawing 4/4.  Able to copy intersecting pentagons well. Cranial Nerves: Fundoscopic exam reveals sharp disc margins. Pupils equal, briskly reactive to light. Extraocular movements full without nystagmus. Visual fields full to confrontation. Hearing slightly diminished bilaterally.. Facial sensation intact. Face, tongue, palate moves normally and symmetrically.  No diminished facial expression.  Glabellar tap is negative. Motor: Normal bulk and tone. Normal strength in all tested extremity muscles except slight diminished fine finger movements on the left and orbits right over left upper extremity..  Absent resting tremor.  Mild action tremor of the left greater than right upper extremity which worsens slightly with intention.  No cogwheel rigidity at rest.  Able to get up from the chair with arms folded across.  No diminished arm swing when she walks. Sensory.: intact to touch , pinprick , position and vibratory sensation.  Coordination: Rapid alternating movements normal in all extremities. Finger-to-nose and heel-to-shin performed accurately bilaterally. Gait and Station:  Arises from chair without difficulty. Stance is normal. Gait demonstrates normal stride length and balance . Able to heel, toe and tandem walk without difficulty.  Posture balance is good.  No festination or retropulsion. Reflexes: 1+ and symmetric. Toes downgoing.       ASSESSMENT: 87 year old Caucasian lady with 2-year history of left upper extremity mild action tremor possibly benign essential .  She also has mild memory and cognitive difficulties likely related to age-appropriate mild cognitive impairment.  Remote history of large left basal ganglia infarct in November 2015 of cryptogenic etiology.  She subsequently was found to have atrial fibrillation few years later and is now on long-term anticoagulation with Eliquis.     PLAN:I had a long discussion with patient with regards to her mild left upper extremity action tremor which likely represents benign essential tremor given her family history of the same.  The tremor does not appear to be functionally disabling at the present time hence we will hold off on medications like Mysoline, Inderal or Topamax at the present time.  I recommend further evaluation with checking MRI scan of the brain to look for interval new strokes as well as check memory panel labs.  He is also discussed memory compensation strategies and encouraged her to increase participation in cognitively challenging activities like solving crossword puzzles, playing bridge and sodoku.  Continue Eliquis for secondary stroke prevention given history of A. fib and maintain aggressive risk factor modification with strict control of hypertension with blood pressure goal below 130/90, lipids with LDL cholesterol goal below 70 mg percent and diabetes with hemoglobin A1c goal below 6.5%.  She will return for follow-up in the future in 3 months or call earlier if necessary.  Greater than 50% time during this 45-minute consultation visit was spent on counseling and coordination of care and  discussion about her tremor and memory loss and remote stroke and answering questions Antony Contras, MD  Note: This document was prepared with digital dictation and possible smart phrase technology. Any transcriptional errors that result from this process are unintentional.

## 2021-04-22 LAB — DEMENTIA PANEL
Homocysteine: 8.7 umol/L (ref 0.0–21.3)
RPR Ser Ql: NONREACTIVE
TSH: 3.05 u[IU]/mL (ref 0.450–4.500)
Vitamin B-12: 752 pg/mL (ref 232–1245)

## 2021-04-25 ENCOUNTER — Encounter: Payer: Self-pay | Admitting: *Deleted

## 2021-06-23 ENCOUNTER — Ambulatory Visit: Payer: Medicare PPO | Admitting: Internal Medicine

## 2021-06-23 ENCOUNTER — Other Ambulatory Visit: Payer: Self-pay

## 2021-06-23 ENCOUNTER — Encounter: Payer: Self-pay | Admitting: Internal Medicine

## 2021-06-23 VITALS — BP 152/78 | HR 56 | Ht 64.0 in | Wt 124.2 lb

## 2021-06-23 DIAGNOSIS — M79642 Pain in left hand: Secondary | ICD-10-CM

## 2021-06-23 DIAGNOSIS — I639 Cerebral infarction, unspecified: Secondary | ICD-10-CM | POA: Diagnosis not present

## 2021-06-23 DIAGNOSIS — Z9889 Other specified postprocedural states: Secondary | ICD-10-CM | POA: Diagnosis not present

## 2021-06-23 DIAGNOSIS — I480 Paroxysmal atrial fibrillation: Secondary | ICD-10-CM | POA: Diagnosis not present

## 2021-06-23 DIAGNOSIS — R609 Edema, unspecified: Secondary | ICD-10-CM

## 2021-06-23 DIAGNOSIS — M79641 Pain in right hand: Secondary | ICD-10-CM

## 2021-06-23 NOTE — Patient Instructions (Addendum)
Medication Instructions:  Your physician recommends that you continue on your current medications as directed. Please refer to the Current Medication list given to you today.  *If you need a refill on your cardiac medications before your next appointment, please call your pharmacy*   Lab Work:  ** CBC, BMET, CRP and Sed rate today If you have labs (blood work) drawn today and your tests are completely normal, you will receive your results only by: [place] (if you have MyChart) OR A paper copy in the mail If you have any lab test that is abnormal or we need to change your treatment, we will call you to review the results.   Testing/Procedures: None ordered.    Follow-Up: At [facility], you and your health needs are our priority.  As part of our continuing mission to provide you with exceptional heart care, we have created designated Provider Care Teams.  These Care Teams include your primary Cardiologist (physician) and Advanced Practice Providers (APPs -  Physician Assistants and Nurse Practitioners) who all work together to provide you with the care you need, when you need it.  We recommend signing up for the patient portal called "MyChart".  Sign up information is provided on this After Visit Summary.  MyChart is used to connect with patients for Virtual Visits (Telemedicine).  Patients are able to view lab/test results, encounter notes, upcoming appointments, etc.  Non-urgent messages can be sent to your provider as well.   To learn more about what you can do with MyChart, go to [email or web address].    Your next appointment:   6 months with Dr Olin Pia PA

## 2021-06-23 NOTE — Progress Notes (Signed)
Patient Care Team: [name] as PCP - General (Physician Assistant)   HPI  [name] is a 88 y.o. female Seen in follow-up for atrial fibrillation with asymptomatic termination pauses.  She has a history of cryptogenic stroke.  Anticoagulated with apixoban without bleeding    Admitted 6/22 for dofetilide.  No interval atrial fibrillation of which she is aware has had some scant palpitations.  No chest pain or shortness of breath.  Continues with unilateral edema.  She reminds me that she has had multiple orthopedic surgeries on both legs.  Was diagnosed with PMR about a year ago.  Has been weaned off for the last couple of months.  Has had problems with her hands with stiffness and pain.  Date Cr K Mg so [name] TSH  3/20 0.75 6.1>>4.8  12.4   3/21 0.75 4.6  12.5   9/22 0.68 4.9 2.2 (6/22) 12.7 3.05 (12/22) 2    Elevated K was attributed to lisinopril ( thought we had stopped it last year 2/2 cough)  HCTZ started and repeat K As above    Echo 11/15 > normal LV function with mild LAE/ Pulm HTN   DATE TEST EF   3/22 Echo   60-65 % PA press 39  6/22 Myoview     % No perfusion defects               Records and Results Reviewed0.6  Past Medical History:  Diagnosis Date   Anemia    hx of with surgery    Arthritis 07-27-11   osteoarthritis-hips/s/p bil. THA, arthritis right hand    Blepharitis of left eye    Blood transfusion feb 2014   post surgery some time ago   Cancer [facility]) 1989   Breast cancer -s/p lt. mastectomy, some squamous cell lesions   Cold hands    Complication of anesthesia    epinephrine - screams uncontrollably / pt does not want general anesthesia or narcotics   Constipation    Fuchs' corneal dystrophy    both eyes   Hard of hearing    both ears mild loss   Hyperkalemia 03/07/2018   Hypertension    Hypothyroidism 3-21- 13   tx. levothyroxine   Lichen sclerosus et atrophicus of the vulva    pt reported.    Osteopenia     Stroke [facility])    a. s/p MDT LINQ 04/2014    Past Surgical History:  Procedure Laterality Date   ABDOMINAL HYSTERECTOMY     ovaries removed   ANKLE SURGERY  2005   right ankle tendon repair   basal cell removed from face     3-4 times   BREAST SURGERY  07-1987   lt.breast cancer intraductal cancer, mastectomy   CATARACT EXTRACTION, BILATERAL  few yrs ago   Bilateral   HARDWARE REMOVAL  04/05/2012   Procedure: HARDWARE REMOVAL;  Surgeon: Gearlean Alf, MD;  Location: [facility];  Service: Orthopedics;  Laterality: Right;  Removal of Hardware Right Femur    HARDWARE REMOVAL Left 06/30/2013   Procedure: LEFT HIP HARDWARE REMOVAL OF CABLES;  Surgeon: Gearlean Alf, MD;  Location: [facility];  Service: Orthopedics;  Laterality: Left;   HARDWARE REMOVAL Right 08/21/2013   Procedure: RIGHT HIP HARDWARE REMOVAL;  Surgeon: Gearlean Alf, MD;  Location: [facility];  Service: Orthopedics;  Laterality: Right;   JOINT REPLACEMENT  07-27-11   Bilateral: Right THA - 04/2000, Left THA 06/2001  left hip replacement Left feb 2003   LOOP RECORDER IMPLANT  04-08-2014   MDT LINQ implanted by Dr Lovena Le for cryptogenic stroke   LOOP RECORDER REMOVAL N/A 04/26/2017   Procedure: LOOP RECORDER REMOVAL;  Surgeon: Evans Lance, MD;  Location: [place] [facility];  Service: Cardiovascular;  Laterality: N/A;   MASTECTOMY  1989   left - Pt states has no restrictions on use of L arm for BP or blood draw   melanoma removed Left    x 1   ORIF FEMUR FRACTURE  07/31/2011   Procedure: OPEN REDUCTION INTERNAL FIXATION (ORIF) DISTAL FEMUR FRACTURE;  Surgeon: Gearlean Alf, MD;  Location: [facility];  Service: Orthopedics;  Laterality: Right;  right femur  (c-arm)    SQUAMOUS CELL CARCINOMA EXCISION  07-27-11   x2 left shoulder   TOTAL HIP REVISION  04/10/2012   Procedure: TOTAL HIP REVISION;  Surgeon: Gearlean Alf, MD;  Location: [facility];  Service: Orthopedics;  Laterality: Right;   TOTAL HIP REVISION Left 06/27/2012    Procedure: LEFT TOTAL HIP REVISION;  Surgeon: Gearlean Alf, MD;  Location: [facility];  Service: Orthopedics;  Laterality: Left;    Current Meds  Medication Sig   acetaminophen (TYLENOL) 500 MG tablet Take 500-1,000 mg by mouth See admin instructions. Take 2 tablets (1000 mg) by mouth daily at bedtime, may also take 1 tablet (500 mg) in the morning as needed for pain   amLODipine (NORVASC) 5 MG tablet Take 1.5 tablets (7.5 mg total) by mouth daily.   apixaban (ELIQUIS) 2.5 MG TABS tablet TAKE 1 TABLET(2.5 MG) BY MOUTH TWICE DAILY   Coenzyme Q10 (COQ-10 PO) Take 1 capsule by mouth daily with breakfast.   diclofenac Sodium (VOLTAREN) 1 % GEL Apply 2 g topically 4 (four) times daily.   dofetilide (TIKOSYN) 250 MCG capsule Take 1 capsule (250 mcg total) by mouth 2 (two) times daily.   levothyroxine (SYNTHROID) 100 MCG tablet Take 1 tablet (100 mcg total) by mouth daily.   Magnesium Oxide 200 MG TABS Take 1 tablet (200 mg total) by mouth daily.   Multiple Vitamins-Iron (MULTIVITAMINS WITH IRON) TABS tablet Take 1 tablet by mouth daily with breakfast.   NON FORMULARY Place 1 drop into both eyes every morning. Sodium Chloride eye drops   Omega-3 Fatty Acids (FISH OIL PO) Take 1 capsule by mouth in the morning and at bedtime.   Propylene Glycol (SYSTANE BALANCE OP) Place 1 drop into both eyes in the morning and at bedtime.   Vitamin D, Cholecalciferol, 1000 UNITS TABS Take 1,000 Units by mouth daily with breakfast.    Allergies  Allergen Reactions   Lactose Intolerance (Gi) Other (See Comments)    Bloating, gas   Epinephrine Other (See Comments)     Reaction:  Caused pt to scream uncontrollably.      Review of Systems negative except from HPI and PMH  Physical Exam BP (!) 152/78    Pulse (!) 56    Ht 5\' 4"  (1.626 m)    Wt 124 lb 3.2 oz (56.3 kg)    SpO2 97%    BMI 21.32 kg/m  Well developed and well nourished in no acute distress HENT normal E scleral and icterus clear Neck Supple JVP  flat; carotids brisk and full Clear to ausculation Regular rate and rhythm, no murmurs gallops or rub Soft with active bowel sounds No clubbing cyanosis L+ Edema extending up into her mid thigh Alert and oriented, grossly normal  motor and sensory function Skin Warm and Dry  ECG sinus @ 56 16/08/44  CrCl cannot be calculated (Patient's most recent lab result is older than the maximum 21 days allowed.).   Assessment and  Plan Atrial fibrillation-persistent now on dofetilide  High risk medication surveillance.  Hypertension  Polymyalgia rheumatica   The patient is holding sinus rhythm on dofetilide.  QTc is within range.  Need surveillance laboratories today with magnesium and potassium.  No bleeding.  Continue on Eliquis 2.5 dosed for age and weight.  Blood pressure is reasonably controlled.  At home blood pressures are 130 or so.  We will continue on amlodipine for 7.5.   Current medicines are reviewed at length with the patient today .  The patient does not  have concerns regarding medicines.

## 2021-06-24 LAB — CBC
Hematocrit: 35.6 % (ref 34.0–46.6)
Hemoglobin: 11.9 g/dL (ref 11.1–15.9)
MCH: 31.5 pg (ref 26.6–33.0)
MCHC: 33.4 g/dL (ref 31.5–35.7)
MCV: 94 fL (ref 79–97)
Platelets: 249 10*3/uL (ref 150–450)
RBC: 3.78 x10E6/uL (ref 3.77–5.28)
RDW: 13.4 % (ref 11.7–15.4)
WBC: 6.6 10*3/uL (ref 3.4–10.8)

## 2021-06-24 LAB — BASIC METABOLIC PANEL
BUN/Creatinine Ratio: 26 (ref 12–28)
BUN: 16 mg/dL (ref 8–27)
CO2: 23 mmol/L (ref 20–29)
Calcium: 9.6 mg/dL (ref 8.7–10.3)
Chloride: 102 mmol/L (ref 96–106)
Creatinine, Ser: 0.62 mg/dL (ref 0.57–1.00)
Glucose: 99 mg/dL (ref 70–99)
Potassium: 5 mmol/L (ref 3.5–5.2)
Sodium: 140 mmol/L (ref 134–144)
eGFR: 86 mL/min/{1.73_m2} (ref >59)

## 2021-06-24 LAB — SEDIMENTATION RATE: Sed Rate: 13 mm/hr (ref 0–40)

## 2021-06-24 LAB — HIGH SENSITIVITY CRP: CRP, High Sensitivity: 2.17 mg/L (ref 0.00–3.00)

## 2021-06-26 ENCOUNTER — Encounter: Payer: Self-pay | Admitting: Internal Medicine

## 2021-07-18 ENCOUNTER — Other Ambulatory Visit: Payer: Self-pay

## 2021-07-18 MED ORDER — LEVOTHYROXINE SODIUM 100 MCG PO TABS: 100.0000 ug | ORAL_TABLET | Freq: Every day | ORAL | 3 refills | Status: DC

## 2021-07-18 MED ORDER — APIXABAN 2.5 MG PO TABS: ORAL_TABLET | ORAL | 1 refills | Status: DC

## 2021-08-02 ENCOUNTER — Encounter: Payer: Medicare PPO | Admitting: Family Medicine

## 2021-08-03 ENCOUNTER — Encounter: Payer: Self-pay | Admitting: Physician Assistant

## 2021-08-03 DIAGNOSIS — E782 Mixed hyperlipidemia: Secondary | ICD-10-CM | POA: Insufficient documentation

## 2021-08-03 NOTE — Progress Notes (Signed)
? ?Established Patient Office Visit ? ?Subjective:  ?Patient ID: [name], female    DOB: 18-Feb-1932  Age: 88 y.o. MRN: [id number] ? ?CC:  ?Chief Complaint  ?Patient presents with  ? Medication Refill  ?  Med check- left ankle swelling, arthritis in hands and shoulders, would like a PT referral. Pt also has bladder leakage.  ? ? ?HPI ?[name] presents for multiple concerns. ? ?[name] reports left ankle swelling for a while; has a history of having several leg surgeries; patient reports that her Cardiologist tells he that her valves cause the ankle swelling and [name] has been managing it with compression stockings. ? ?Reports OA in both hands and neck and requests PT for hands and neck to be shown some exercises to strengthen her muscles.  ? ?Reports urinary leakage for a while; wears panty liners for it.  ? ? ? ?Outpatient Medications Prior to Visit  ?Medication Sig Dispense Refill  ? acetaminophen (TYLENOL) 500 MG tablet Take 500-1,000 mg by mouth See admin instructions. Take 2 tablets (1000 mg) by mouth daily at bedtime, may also take 1 tablet (500 mg) in the morning as needed for pain    ? apixaban (ELIQUIS) 2.5 MG TABS tablet TAKE 1 TABLET(2.5 MG) BY MOUTH TWICE DAILY 180 tablet 1  ? Coenzyme Q10 (COQ-10 PO) Take 1 capsule by mouth daily with breakfast.    ? dofetilide (TIKOSYN) 250 MCG capsule Take 1 capsule (250 mcg total) by mouth 2 (two) times daily. 180 capsule 1  ? levothyroxine (SYNTHROID) 100 MCG tablet Take 1 tablet (100 mcg total) by mouth daily. 30 tablet 3  ? Magnesium Oxide 200 MG TABS Take 1 tablet (200 mg total) by mouth daily. 30 tablet 5  ? Multiple Vitamins-Iron (MULTIVITAMINS WITH IRON) TABS tablet Take 1 tablet by mouth daily with breakfast.    ? NON FORMULARY Place 1 drop into both eyes every morning. Sodium Chloride eye drops    ? Omega-3 Fatty Acids (FISH OIL PO) Take 1 capsule by mouth in the morning and at bedtime.    ? Propylene Glycol (SYSTANE BALANCE OP) Place 1 drop into both  eyes in the morning and at bedtime.    ? Vitamin D, Cholecalciferol, 1000 UNITS TABS Take 1,000 Units by mouth daily with breakfast.    ? diclofenac Sodium (VOLTAREN) 1 % GEL Apply 2 g topically 4 (four) times daily. 100 g 1  ? amLODipine (NORVASC) 5 MG tablet Take 1.5 tablets (7.5 mg total) by mouth daily. 135 tablet 1  ? ?No facility-administered medications prior to visit.  ? ? ?Allergies  ?Allergen Reactions  ? Lactose Intolerance (Gi) Other (See Comments)  ?  Bloating, gas  ? Epinephrine Other (See Comments)  ?   Reaction:  Caused pt to scream uncontrollably.  ? ? ?ROS ?Review of Systems  ?Constitutional:  Negative for activity change and chills.  ?HENT:  Negative for congestion and voice change.   ?Eyes:  Negative for pain and redness.  ?Respiratory:  Negative for cough and wheezing.   ?Cardiovascular:  Negative for chest pain.  ?Gastrointestinal:  Negative for constipation, diarrhea, nausea and vomiting.  ?Endocrine: Negative for polyuria.  ?Genitourinary:  Positive for frequency and urgency. Negative for dysuria.  ?Musculoskeletal:  Positive for arthralgias.  ?Skin:  Negative for color change and rash.  ?Allergic/Immunologic: Negative for immunocompromised state.  ?Neurological:  Negative for dizziness.  ?Psychiatric/Behavioral:  Negative for agitation.   ? ?  ?Objective:  ?  ?Physical Exam ?  Vitals and nursing note reviewed.  ?Constitutional:   ?   General: [name] is not in acute distress. ?   Appearance: [name] is normal weight. [name] is not ill-appearing.  ?HENT:  ?   Head: Normocephalic and atraumatic.  ?   Right Ear: External ear normal.  ?   Left Ear: External ear normal.  ?Eyes:  ?   Extraocular Movements: Extraocular movements intact.  ?   Conjunctiva/sclera: Conjunctivae normal.  ?   Pupils: Pupils are equal, round, and reactive to light.  ?Cardiovascular:  ?   Rate and Rhythm: Normal rate and regular rhythm.  ?Pulmonary:  ?   Effort: Pulmonary effort is normal.  ?   Breath sounds: Normal breath sounds. No  wheezing.  ?Abdominal:  ?   General: Bowel sounds are normal.  ?   Palpations: Abdomen is soft.  ?Musculoskeletal:  ?   Right hand: No swelling or deformity. Normal range of motion. Normal pulse.  ?   Left hand: No swelling or deformity. Normal range of motion. Normal pulse.  ?   Cervical back: No swelling, edema or erythema. Pain with movement present. Decreased range of motion.  ?Skin: ?   General: Skin is warm and dry.  ?Neurological:  ?   Mental Status: [name] is alert and oriented to person, place, and time.  ?Psychiatric:     ?   Mood and Affect: Mood normal.  ? ? ?BP 130/70   Pulse (!) 49   Wt 122 lb 9.6 oz (55.6 kg)   SpO2 97%   BMI 21.04 kg/m?  ? ?Wt Readings from Last 3 Encounters:  ?08/04/21 122 lb 9.6 oz (55.6 kg)  ?06/23/21 124 lb 3.2 oz (56.3 kg)  ?04/21/21 119 lb (54 kg)  ? ? ?Results for orders placed or performed in visit on 06/23/21  ?Basic metabolic panel  ?Result Value Ref Range  ? Glucose 99 70 - 99 mg/dL  ? BUN 16 8 - 27 mg/dL  ? Creatinine, Ser 0.62 0.57 - 1.00 mg/dL  ? eGFR 86 >59 mL/min/1.73  ? BUN/Creatinine Ratio 26 12 - 28  ? Sodium 140 134 - 144 mmol/L  ? Potassium 5.0 3.5 - 5.2 mmol/L  ? Chloride 102 96 - 106 mmol/L  ? CO2 23 20 - 29 mmol/L  ? Calcium 9.6 8.7 - 10.3 mg/dL  ?CBC  ?Result Value Ref Range  ? WBC 6.6 3.4 - 10.8 x10E3/uL  ? RBC 3.78 3.77 - 5.28 x10E6/uL  ? Hemoglobin 11.9 11.1 - 15.9 g/dL  ? Hematocrit 35.6 34.0 - 46.6 %  ? MCV 94 79 - 97 fL  ? MCH 31.5 26.6 - 33.0 pg  ? MCHC 33.4 31.5 - 35.7 g/dL  ? RDW 13.4 11.7 - 15.4 %  ? Platelets 249 150 - 450 x10E3/uL  ?Sedimentation rate  ?Result Value Ref Range  ? Sed Rate 13 0 - 40 mm/hr  ?CRP High sensitivity  ?Result Value Ref Range  ? CRP, High Sensitivity 2.17 0.00 - 3.00 mg/L  ?  ? ? ? ?The ASCVD Risk score (Arnett DK, et al., 2019) failed to calculate for the following reasons: ?  The 2019 ASCVD risk score is only valid for ages 60 to 66 ?  The patient has a prior MI or stroke diagnosis ? ?  ?Assessment & Plan:  ? ?Problem  List Items Addressed This Visit   ? ?  ? Cardiovascular and Mediastinum  ? Essential hypertension - Primary  ? Relevant Medications  ?  amLODipine (NORVASC) 5 MG tablet  ?  ? Musculoskeletal and Integument  ? Osteoarthritis of both hands  ? Relevant Medications  ? diclofenac Sodium (VOLTAREN) 1 % GEL  ? Other Relevant Orders  ? Ambulatory referral to Physical Therapy  ?  ? Genitourinary  ? Overactive bladder  ? Relevant Medications  ? tolterodine (DETROL) 2 MG tablet  ?  ? Other  ? Mixed hyperlipidemia  ? Relevant Medications  ? amLODipine (NORVASC) 5 MG tablet  ? ?Other Visit Diagnoses   ? ? Neck pain      ? Relevant Orders  ? Ambulatory referral to Physical Therapy  ? ?  ? ? ?Meds ordered this encounter  ?Medications  ? amLODipine (NORVASC) 5 MG tablet  ?  Sig: Take 1.5 tablets (7.5 mg total) by mouth daily.  ?  Dispense:  135 tablet  ?  Refill:  1  ?  Order Specific Question:   Supervising Provider  ?  Answer:   Denita Lung [[id number]]  ? diclofenac Sodium (VOLTAREN) 1 % GEL  ?  Sig: Apply 2 g topically 4 (four) times daily.  ?  Dispense:  100 g  ?  Refill:  1  ?  Order Specific Question:   Supervising Provider  ?  Answer:   Denita Lung [[id number]]  ? tolterodine (DETROL) 2 MG tablet  ?  Sig: Take 1 tablet (2 mg total) by mouth 2 (two) times daily.  ?  Dispense:  60 tablet  ?  Refill:  4  ?  Order Specific Question:   Supervising Provider  ?  Answer:   Denita Lung [[id number]]  ? ? ?Follow-up: Return in about 6 months (around 01/30/2022) for Return for Annual Exam.  ? ? ?Irene Pap, PA-C ?

## 2021-08-04 ENCOUNTER — Encounter: Payer: Self-pay | Admitting: Physician Assistant

## 2021-08-04 ENCOUNTER — Encounter: Payer: Self-pay | Admitting: Internal Medicine

## 2021-08-04 ENCOUNTER — Ambulatory Visit: Payer: Medicare PPO | Admitting: Physician Assistant

## 2021-08-04 VITALS — BP 130/70 | HR 49 | Ht 64.0 in | Wt 122.6 lb

## 2021-08-04 DIAGNOSIS — E782 Mixed hyperlipidemia: Secondary | ICD-10-CM | POA: Diagnosis not present

## 2021-08-04 DIAGNOSIS — N3281 Overactive bladder: Secondary | ICD-10-CM | POA: Insufficient documentation

## 2021-08-04 DIAGNOSIS — M19041 Primary osteoarthritis, right hand: Secondary | ICD-10-CM | POA: Diagnosis not present

## 2021-08-04 DIAGNOSIS — I10 Essential (primary) hypertension: Secondary | ICD-10-CM | POA: Diagnosis not present

## 2021-08-04 DIAGNOSIS — M19042 Primary osteoarthritis, left hand: Secondary | ICD-10-CM

## 2021-08-04 DIAGNOSIS — M542 Cervicalgia: Secondary | ICD-10-CM

## 2021-08-04 MED ORDER — DICLOFENAC SODIUM 1 % EX GEL: 2.0000 g | Freq: Four times a day (QID) | CUTANEOUS | 1 refills | Status: DC

## 2021-08-04 MED ORDER — TOLTERODINE TARTRATE 2 MG PO TABS: 2.0000 mg | ORAL_TABLET | Freq: Two times a day (BID) | ORAL | 4 refills | Status: DC

## 2021-08-04 MED ORDER — AMLODIPINE BESYLATE 5 MG PO TABS: 7.5000 mg | ORAL_TABLET | Freq: Every day | ORAL | 1 refills | Status: DC

## 2021-08-04 NOTE — Assessment & Plan Note (Signed)
Diet controlled, eat a low fat diet, increase fiber intake (Benefiber or Metamucil, Cherrios,  oatmeal, beans, nuts, fruits and vegetables), limit saturated fats (in fried foods, red meat), can add OTC fish oil supplement, eat fish with Omega-3 fatty acids like salmon and tuna, exercise for 30 minutes 3 - 5 times a week, drink 8 - 10 glasses of water a day ? ? ?

## 2021-08-04 NOTE — Assessment & Plan Note (Addendum)
Stable, will refer to PT for exercises to help, continue Voltaren gel as needed, may also use on neck pain ?

## 2021-08-04 NOTE — Assessment & Plan Note (Signed)
Stable, patient will try Detrol 2 mg bid ?

## 2021-08-04 NOTE — Assessment & Plan Note (Signed)
controlled, continue amlodipine 5 mg qd, eat a low salt diet, do not add any salt to food when cooking, avoid processed foods, avoid fried foods ? ?

## 2021-08-04 NOTE — Patient Instructions (Addendum)
You will get a call to schedule an appointment with physical therapy.  ? ? ?

## 2021-08-05 ENCOUNTER — Encounter: Payer: Self-pay | Admitting: Physician Assistant

## 2021-08-09 ENCOUNTER — Encounter: Payer: Self-pay | Admitting: Physician Assistant

## 2021-09-05 ENCOUNTER — Encounter: Payer: Self-pay | Admitting: Internal Medicine

## 2021-09-12 ENCOUNTER — Telehealth: Payer: Self-pay | Admitting: Neurology

## 2021-09-12 NOTE — Telephone Encounter (Signed)
LVM and sent mychart message informing pt of r/s needed for 5/17 appt- Dr. Leonie Man out. ?

## 2021-09-13 ENCOUNTER — Encounter: Payer: Self-pay | Admitting: Internal Medicine

## 2021-09-16 ENCOUNTER — Other Ambulatory Visit: Payer: Self-pay | Admitting: Physician Assistant

## 2021-09-16 ENCOUNTER — Telehealth: Payer: Self-pay

## 2021-09-16 DIAGNOSIS — M542 Cervicalgia: Secondary | ICD-10-CM

## 2021-09-16 DIAGNOSIS — M19041 Primary osteoarthritis, right hand: Secondary | ICD-10-CM

## 2021-09-16 NOTE — Telephone Encounter (Signed)
Pt. Called wanting to know if she could get a new referral to a different PT. She did not like the [facility]. She would like a new referral put in to [facility].  ?

## 2021-09-16 NOTE — Telephone Encounter (Signed)
Physical therapy for neck pain and osteoarthritis of both hands ordered for [facility].

## 2021-09-19 ENCOUNTER — Encounter: Payer: Self-pay | Admitting: Physician Assistant

## 2021-09-21 ENCOUNTER — Ambulatory Visit: Payer: Medicare PPO | Admitting: Neurology

## 2021-10-10 DIAGNOSIS — M79606 Pain in leg, unspecified: Secondary | ICD-10-CM | POA: Diagnosis not present

## 2021-10-10 DIAGNOSIS — M25519 Pain in unspecified shoulder: Secondary | ICD-10-CM | POA: Diagnosis not present

## 2021-10-17 ENCOUNTER — Other Ambulatory Visit: Payer: Self-pay

## 2021-10-17 MED ORDER — DOFETILIDE 250 MCG PO CAPS: 250.0000 ug | ORAL_CAPSULE | Freq: Two times a day (BID) | ORAL | 2 refills | Status: DC

## 2021-10-21 ENCOUNTER — Other Ambulatory Visit: Payer: Self-pay

## 2021-10-21 MED ORDER — DOFETILIDE 250 MCG PO CAPS: 250.0000 ug | ORAL_CAPSULE | Freq: Two times a day (BID) | ORAL | 2 refills | Status: DC

## 2021-11-04 ENCOUNTER — Encounter: Payer: Self-pay | Admitting: Internal Medicine

## 2021-11-05 ENCOUNTER — Other Ambulatory Visit: Payer: Self-pay | Admitting: Physician Assistant

## 2021-11-07 DIAGNOSIS — L03011 Cellulitis of right finger: Secondary | ICD-10-CM | POA: Diagnosis not present

## 2021-11-08 ENCOUNTER — Emergency Department (HOSPITAL_COMMUNITY)
Admission: EM | Admit: 2021-11-08 | Discharge: 2021-11-08 | Disposition: A | Discharge status: Home / Self Care | Payer: Medicare PPO | Attending: Emergency Medicine | Admitting: Emergency Medicine

## 2021-11-08 ENCOUNTER — Other Ambulatory Visit: Payer: Self-pay

## 2021-11-08 ENCOUNTER — Encounter (HOSPITAL_COMMUNITY): Payer: Self-pay

## 2021-11-08 DIAGNOSIS — Z7901 Long term (current) use of anticoagulants: Secondary | ICD-10-CM | POA: Diagnosis not present

## 2021-11-08 DIAGNOSIS — S61052A Open bite of left thumb without damage to nail, initial encounter: Secondary | ICD-10-CM | POA: Diagnosis not present

## 2021-11-08 DIAGNOSIS — S61051A Open bite of right thumb without damage to nail, initial encounter: Secondary | ICD-10-CM | POA: Diagnosis not present

## 2021-11-08 DIAGNOSIS — Z203 Contact with and (suspected) exposure to rabies: Secondary | ICD-10-CM | POA: Diagnosis not present

## 2021-11-08 DIAGNOSIS — T148XXA Other injury of unspecified body region, initial encounter: Secondary | ICD-10-CM

## 2021-11-08 DIAGNOSIS — W5501XA Bitten by cat, initial encounter: Secondary | ICD-10-CM | POA: Insufficient documentation

## 2021-11-08 MED ORDER — RABIES VACCINE, PCEC IM SUSR
1.0000 mL | Freq: Once | INTRAMUSCULAR | Status: AC
  Administered 2021-11-08: 1 mL via INTRAMUSCULAR
  Filled 2021-11-08: qty 1

## 2021-11-08 NOTE — ED Triage Notes (Signed)
Pt was bitten by a ferrel cat in her right thumbx2 days ago. Pt went to [facility] yesterday where they gave her atx. The dr at [facility] told her that the [facility] suggested that pt come to the ED to get rabies injections.

## 2021-11-08 NOTE — Discharge Instructions (Signed)
Return for any problem.  You will require additional doses of rabies vaccine to complete the series.  Today July, 4th is day 0.  Additional doses of vaccine need to be administered on days 3 (7/7), day 7 (7/11), and day 14 (7/18).   Complete Augmentin as previously prescribed.

## 2021-11-08 NOTE — ED Provider Notes (Signed)
[facility] EM[facility] DEPARTMENT Provider Note   CSN: [id number] Arrival date & time: 11/08/21  1029     History  Chief Complaint  Patient presents with   rabies shots    [name] is a 88 y.o. female.  88 year old female with prior medical history as detailed below presents for evaluation.  Patient reports that she attempted to pet a stray cat in her neighborhood.  The cat bit her right thumb 3 days ago.  She went to urgent care yesterday and was started on a Augmentin.  She was referred to the ED for rabies treatment.  She denies other symptoms.  She reports that her thumb is healing well.  She denies problems or side effects from the Augmentin.  She is agreeable to rabies vaccine series initiation.  The history is provided by the patient and medical records.  Illness Location:  Animal bite to right thumb, possible rabies exposure Severity:  Mild Onset quality:  Sudden Duration:  3 days Timing:  Rare Progression:  Unchanged Chronicity:  New      Home Medications Prior to Admission medications   Medication Sig Start Date End Date Taking? Authorizing Provider  acetaminophen (TYLENOL) 500 MG tablet Take 500-1,000 mg by mouth See admin instructions. Take 2 tablets (1000 mg) by mouth daily at bedtime, may also take 1 tablet (500 mg) in the morning as needed for pain    [provider]  amLODipine (NORVASC) 5 MG tablet Take 1.5 tablets (7.5 mg total) by mouth daily. 08/04/21 08/04/22  Francis Gaines B, PA-C  apixaban (ELIQUIS) 2.5 MG TABS tablet TAKE 1 TABLET(2.5 MG) BY MOUTH TWICE DAILY 07/18/21   Irene Pap, PA-C  Coenzyme Q10 (COQ-10 PO) Take 1 capsule by mouth daily with breakfast.    [provider]  diclofenac Sodium (VOLTAREN) 1 % GEL Apply 2 g topically 4 (four) times daily. 08/04/21   Irene Pap, PA-C  dofetilide (TIKOSYN) 250 MCG capsule Take 1 capsule (250 mcg total) by mouth 2 (two) times daily. 10/21/21   Baldwin Jamaica, PA-C  levothyroxine (SYNTHROID) 100 MCG tablet TAKE 1 TABLET(100 MCG) BY MOUTH DAILY 11/07/21   Irene Pap, PA-C  Magnesium Oxide 200 MG TABS Take 1 tablet (200 mg total) by mouth daily. 10/21/20   Baldwin Jamaica, PA-C  Multiple Vitamins-Iron (MULTIVITAMINS WITH IRON) TABS tablet Take 1 tablet by mouth daily with breakfast.    [provider]  NON FORMULARY Place 1 drop into both eyes every morning. Sodium Chloride eye drops    [provider]  Omega-3 Fatty Acids (FISH OIL PO) Take 1 capsule by mouth in the morning and at bedtime.    [provider]  Propylene Glycol (SYSTANE BALANCE OP) Place 1 drop into both eyes in the morning and at bedtime.    [provider]  tolterodine (DETROL) 2 MG tablet Take 1 tablet (2 mg total) by mouth 2 (two) times daily. 08/04/21   Irene Pap, PA-C  Vitamin D, Cholecalciferol, 1000 UNITS TABS Take 1,000 Units by mouth daily with breakfast.    [provider]      Allergies    Lactose intolerance (gi) and Epinephrine    Review of Systems   Review of Systems  All other systems reviewed and are negative.   Physical Exam Updated Vital Signs BP (!) 172/92 (BP Location: Right Arm)   Pulse 62   Temp 98.4 F (36.9 C) (Oral)   Resp  16   Ht '5\' 4"'$  (1.626 m)   Wt 56 kg   SpO2 100%   BMI 21.19 kg/m  Physical Exam Vitals and nursing note reviewed.  Constitutional:      General: She is not in acute distress.    Appearance: Normal appearance. She is well-developed.  HENT:     Head: Normocephalic and atraumatic.  Eyes:     Conjunctiva/sclera: Conjunctivae normal.     Pupils: Pupils are equal, round, and reactive to light.  Pulmonary:     Effort: Pulmonary effort is normal. No respiratory distress.  Abdominal:     General: There is no distension.     Tenderness: There is no abdominal tenderness.  Musculoskeletal:        General: No deformity. Normal range of motion.     Cervical back:  Normal range of motion.     Comments: Small punctate healing cat bite to right thumb.  No surrounding erythema.  Full active range of motion.  No evidence of significant infection.  Skin:    General: Skin is warm.  Neurological:     General: No focal deficit present.     Mental Status: She is alert and oriented to person, place, and time.     ED Results / Procedures / Treatments   Labs (all labs ordered are listed, but only abnormal results are displayed) Labs Reviewed - No data to display  EKG None  Radiology No results found.  Procedures Procedures    Medications Ordered in ED Medications  rabies vaccine (RABAVERT) injection 1 mL (has no administration in time range)    ED Course/ Medical Decision Making/ A&P                           Medical Decision Making Risk Prescription drug management.    Medical Screen Complete  This patient presented to the ED with complaint of animal bite.  This complaint involves an extensive number of treatment options. The initial differential diagnosis includes, but is not limited to, rabies exposure  This presentation is: Chronic, Self-Limited, Previously Undiagnosed, Uncertain Prognosis, Complicated, Systemic Symptoms, and Threat to Life/Bodily Function  88 year old female is presenting after cat bite 2 to 3 days prior.  She is already on Augmentin for treatment of possible bacterial infection.  She is here for rabies vaccination.  Given the length of time between bite and exam there is no indication for rabies Ig.  Patient understands need to complete entire course of rabies vaccine.  Today July 4 is day 0.  She is advised to return for additional vaccination on July 7, July 11, July 18.  She reports that she is traveling to [place] on July 14 and she is unsure whether she will be able to get the last vaccine in the series.  Patient is advised that [place]'s ER's should also stock rabies vaccine.  Additional history  obtained:  External records from outside sources obtained and reviewed including prior ED visits and prior Inpatient records.    Medicines ordered:  I ordered medication including rabies vaccine for rabies prophylaxis  Reevaluation of the patient after these medicines showed that the patient: improved    Problem List / ED Course:  Animal bite, rabies prophylaxis   Reevaluation:  After the interventions noted above, I reevaluated the patient and found that they have: improved  Disposition:  After consideration of the diagnostic results and the patients response to treatment, I feel that the  patent would benefit from outpatient follow-up.          Final Clinical Impression(s) / ED Diagnoses Final diagnoses:  Animal bite    Rx / DC Orders ED Discharge Orders     None         Valarie Merino, MD 11/08/21 1128

## 2021-11-11 ENCOUNTER — Ambulatory Visit (HOSPITAL_COMMUNITY)
Admission: RE | Admit: 2021-11-11 | Discharge: 2021-11-11 | Disposition: A | Discharge status: Home / Self Care | Payer: Medicare PPO | Source: Ambulatory Visit | Attending: Internal Medicine | Admitting: Internal Medicine

## 2021-11-11 DIAGNOSIS — Z203 Contact with and (suspected) exposure to rabies: Secondary | ICD-10-CM | POA: Diagnosis not present

## 2021-11-11 MED ORDER — RABIES VACCINE, PCEC IM SUSR
INTRAMUSCULAR | Status: AC
  Filled 2021-11-11: qty 1

## 2021-11-11 MED ORDER — RABIES VACCINE, PCEC IM SUSR
1.0000 mL | Freq: Once | INTRAMUSCULAR | Status: AC
  Administered 2021-11-11: 1 mL via INTRAMUSCULAR

## 2021-11-11 NOTE — ED Triage Notes (Signed)
Pt presents for 2nd rabies injection. No other complaints.

## 2021-11-17 ENCOUNTER — Ambulatory Visit (HOSPITAL_COMMUNITY)
Admission: RE | Admit: 2021-11-17 | Discharge: 2021-11-17 | Disposition: A | Discharge status: Home / Self Care | Payer: Medicare PPO | Source: Ambulatory Visit | Attending: Internal Medicine | Admitting: Internal Medicine

## 2021-11-17 ENCOUNTER — Encounter (HOSPITAL_COMMUNITY): Payer: Self-pay

## 2021-11-17 DIAGNOSIS — Z203 Contact with and (suspected) exposure to rabies: Secondary | ICD-10-CM

## 2021-11-17 MED ORDER — RABIES VACCINE, PCEC IM SUSR
INTRAMUSCULAR | Status: AC
  Filled 2021-11-17: qty 1

## 2021-11-17 MED ORDER — RABIES VACCINE, PCEC IM SUSR
1.0000 mL | Freq: Once | INTRAMUSCULAR | Status: AC
  Administered 2021-11-17: 1 mL via INTRAMUSCULAR

## 2021-11-17 NOTE — ED Triage Notes (Signed)
Pt presents for third rabies injection. Denies other complaints.

## 2021-12-16 ENCOUNTER — Encounter: Payer: Self-pay | Admitting: Internal Medicine

## 2021-12-26 LAB — HM MAMMOGRAPHY

## 2022-01-11 ENCOUNTER — Ambulatory Visit: Payer: Medicare PPO | Admitting: Neurology

## 2022-01-11 VITALS — BP 168/71 | HR 60 | Ht 64.0 in | Wt 122.0 lb

## 2022-01-11 DIAGNOSIS — G250 Essential tremor: Secondary | ICD-10-CM

## 2022-01-11 DIAGNOSIS — G3184 Mild cognitive impairment, so stated: Secondary | ICD-10-CM | POA: Diagnosis not present

## 2022-01-11 DIAGNOSIS — Z8673 Personal history of transient ischemic attack (TIA), and cerebral infarction without residual deficits: Secondary | ICD-10-CM | POA: Diagnosis not present

## 2022-01-11 NOTE — Patient Instructions (Signed)
I had a long discussion with the patient regarding her essential tremor which appears to be quite mild and not bothersome hands agree with not trying, tremor specific medications at the present time.  Her mild cognitive impairment is also quite stable and encouraged her to continue participation cognitively challenging activities like solving crossword puzzles, playing bridge and sudoku.  We also discussed memory compensation strategies.  Continue Eliquis for stroke prevention for her A-fib factor modification with strict control of hypertension with blood pressure goal below 140/90 and lipids with LDL cholesterol goal below 70 mg and diabetes with the goal below 6.5%.  Plan for follow-up in the future only as needed and no scheduled appointment was made.

## 2022-01-11 NOTE — Progress Notes (Signed)
[facility] [address] [place]. [place] [address] [phone]       OFFICE FOLLOW-UP VISIT NOTE  [name] Date of Birth:  1931/05/29 Medical Record Number:  [id number]   Referring MD: Raynald Blend, PA-C  Reason for Referral: Tremor  HPI: Initial visit 04/21/2021 :[name] is 88 year old pleasant Caucasian lady seen today for office consultation visit for tremor.  History is obtained from the patient and review of electronic medical records and I am reviewed available pertinent imaging films in PACS.  She has past medical history of cryptogenic large left basal ganglia infarct and December 2015 for which she underwent extensive work-up including loop recorder and subsequently years later she was found to have A. fib long-term anticoagulation.  She was seen by me for follow-up last time in 2016 and lost to follow-up.  Patient states that for the last 2 years or so she has noticed some intermittent tremor in the left hand.  Tremor is absent at rest present mostly when she is putting some weight on the hand and using it for some activities.  This is noticeable and annoying but not incapacitating.  She correctly states she does not want to take medications for this but just wants to find out possible etiology.  The tremor is noticeable when she is holding a book or a cup in her hand but she does not spill coffee or water.  She has not noticed a tremor on the right side or in the legs or voice or head.  She does admit that her brother had a similar tremor at her age.  He was never formally diagnosed with with benign essential tremor.  The patient does not drink alcohol and is unable to tell me if that has any effect on the tremor.  She is also concerned about her short-term memory which seems to be getting worse over the years.  She has trouble at times remembering recent information as well as the names of family and friends she may remember them later.  He is quite  independent and manages all her affairs.  She denies any significant headaches, or other strokelike symptoms.  She had a stroke in 2015 from which she has recovered fairly well.  He has no lasting residual deficits from a stroke.  She denies other symptoms like drooling of saliva, bradykinesia, balance problems difficulty with gait initiation or freezing of legs, stooped posture.  Family history of Parkinson's disease.  No family history of dementia either. Update 01/11/2022: She returns for follow-up after last visit nearly 9 months ago.  Patient states she is doing well.  She did have noted stroke or TIA symptoms.  She remains on Eliquis which is tolerating well with minor bruising but no bleeding episodes.  She states her blood pressure is usually under good control though it is elevated today in office at 168/71.  She continues to have mild tremor in the left hand which is slightly bothersome but he still does not want medication for it.  Continues to have mild memory difficulties but she has learnt to compensate and write things down.  Living independently and her daughter who lives next door provides support  She has no new complaints. ROS:   14 system review of systems is positive for tremor, memory difficulties, word finding difficulties, leg pain, walking difficulty all other systems negative  PMH:  Past Medical History:  Diagnosis Date   Acute CVA (cerebrovascular accident) ([place]) 03/10/2014   Anemia  hx of with surgery    Arthritis 07-27-11   osteoarthritis-hips/s/p bil. THA, arthritis right hand    Blepharitis of left eye    Blood transfusion feb 2014   post surgery some time ago   Cancer [facility]) 1989   Breast cancer -s/p lt. mastectomy, some squamous cell lesions   Cold hands    Complication of anesthesia    epinephrine - screams uncontrollably / pt does not want general anesthesia or narcotics   Constipation    Dyslipidemia 04/15/2015   Elevated LDL cholesterol level 01/26/2020    Fuchs' corneal dystrophy    both eyes   Hard of hearing    both ears mild loss   Hyperkalemia 03/07/2018   Hypertension    Hypothyroidism 3-21- 13   tx. levothyroxine   Lichen sclerosus et atrophicus of the vulva    pt reported.    Osteopenia    Stroke [facility])    a. s/p MDT LINQ 04/2014    Social History:  Social History   Socioeconomic History   Marital status: Single    Spouse name: Not on file   Number of children: 1   Years of education: Not on file   Highest education level: Not on file  Occupational History   Occupation: Retired  Tobacco Use   Smoking status: Former    Packs/day: 1.00    Years: 10.00    Total pack years: 10.00    Types: Cigarettes    Quit date: 05/09/1963    Years since quitting: 58.7    Passive exposure: Never   Smokeless tobacco: Never  Vaping Use   Vaping Use: Never used  Substance and Sexual Activity   Alcohol use: Yes    Alcohol/week: 1.0 standard drink of alcohol    Types: 1 Glasses of wine per week    Comment: very little   Drug use: No   Sexual activity: Never  Other Topics Concern   Not on file  Social History Narrative   Lives alone.   Social Determinants of Health   Financial Resource Strain: Not on file  Food Insecurity: Not on file  Transportation Needs: Not on file  Physical Activity: Not on file  Stress: Not on file  Social Connections: Not on file  Intimate Partner Violence: Not on file    Medications:   Current Outpatient Medications on File Prior to Visit  Medication Sig Dispense Refill   acetaminophen (TYLENOL) 500 MG tablet Take 500-1,000 mg by mouth See admin instructions. Take 2 tablets (1000 mg) by mouth daily at bedtime, may also take 1 tablet (500 mg) in the morning as needed for pain     amLODipine (NORVASC) 5 MG tablet Take 1.5 tablets (7.5 mg total) by mouth daily. 135 tablet 1   apixaban (ELIQUIS) 2.5 MG TABS tablet TAKE 1 TABLET(2.5 MG) BY MOUTH TWICE DAILY 180 tablet 1   Coenzyme Q10 (COQ-10 PO) Take  1 capsule by mouth daily with breakfast.     diclofenac Sodium (VOLTAREN) 1 % GEL Apply 2 g topically 4 (four) times daily. 100 g 1   dofetilide (TIKOSYN) 250 MCG capsule Take 1 capsule (250 mcg total) by mouth 2 (two) times daily. 180 capsule 2   levothyroxine (SYNTHROID) 100 MCG tablet TAKE 1 TABLET(100 MCG) BY MOUTH DAILY 30 tablet 3   Magnesium Oxide 200 MG TABS Take 1 tablet (200 mg total) by mouth daily. 30 tablet 5   Multiple Vitamins-Iron (MULTIVITAMINS WITH IRON) TABS tablet Take 1 tablet by  mouth daily with breakfast.     NON FORMULARY Place 1 drop into both eyes every morning. Sodium Chloride eye drops     Omega-3 Fatty Acids (FISH OIL PO) Take 1 capsule by mouth in the morning and at bedtime.     Propylene Glycol (SYSTANE BALANCE OP) Place 1 drop into both eyes in the morning and at bedtime.     Vitamin D, Cholecalciferol, 1000 UNITS TABS Take 1,000 Units by mouth daily with breakfast.     No current facility-administered medications on file prior to visit.    Allergies:   Allergies  Allergen Reactions   Lactose Intolerance (Gi) Other (See Comments)    Bloating, gas   Epinephrine Other (See Comments)     Reaction:  Caused pt to scream uncontrollably.    Physical Exam General: Frail elderly Caucasian lady., seated, in no evident distress Head: head normocephalic and atraumatic.   Neck: supple with no carotid or supraclavicular bruits Cardiovascular: regular rate and rhythm, no murmurs Musculoskeletal: Mild kyphoscoliosis. Skin:  no rash/petichiae Vascular:  Normal pulses all extremities  Neurologic Exam Mental Status: Awake and fully alert. Oriented to place and time. Recent and remote memory intact. Attention span, concentration and fund of knowledge appropriate. Mood and affect appropriate.  Recall 3/3.  Able to name 13 animals which can walk on 4 legs.  Clock drawing 4/4.  Mini-Mental status exam not done l. Cranial Nerves: Fundoscopic exam reveals sharp disc margins.  Pupils equal, briskly reactive to light. Extraocular movements full without nystagmus. Visual fields full to confrontation. Hearing slightly diminished bilaterally.. Facial sensation intact. Face, tongue, palate moves normally and symmetrically.  No diminished facial expression.  Glabellar tap is negative. Motor: Normal bulk and tone. Normal strength in all tested extremity muscles except slight diminished fine finger movements on the left and orbits right over left upper extremity..  Absent resting tremor.  Mild action tremor of the left greater than right upper extremity which worsens slightly with intention.  No cogwheel rigidity at rest.  Able to get up from the chair with arms folded across.  No diminished arm swing when she walks. Sensory.: intact to touch , pinprick , position and vibratory sensation.  Coordination: Rapid alternating movements normal in all extremities. Finger-to-nose and heel-to-shin performed accurately bilaterally. Gait and Station: Arises from chair without difficulty. Stance is normal. Gait demonstrates normal stride length and balance . Able to heel, toe and tandem walk without difficulty.  Posture balance is good.  No festination or retropulsion. Reflexes: 1+ and symmetric. Toes downgoing.       ASSESSMENT: 88 year old Caucasian lady with 2-year history of left upper extremity mild action tremor possibly benign essential .  She also has mild memory and cognitive difficulties likely related to age-appropriate mild cognitive impairment.  Remote history of large left basal ganglia infarct in November 2015 of cryptogenic etiology.  She subsequently was found to have atrial fibrillation few years later and is now on long-term anticoagulation with Eliquis.     PLAN:I had a long discussion with the patient regarding her essential tremor which appears to be quite mild and not bothersome hands agree with not trying, tremor specific medications at the present time.  Her mild  cognitive impairment is also quite stable and encouraged her to continue participation cognitively challenging activities like solving crossword puzzles, playing bridge and sudoku.  We also discussed memory compensation strategies.  Continue Eliquis for stroke prevention for her A-fib factor modification with strict control of hypertension with blood  pressure goal below 140/90 and lipids with LDL cholesterol goal below 70 mg and diabetes with the goal below 6.5%.  Plan for follow-up in the future only as needed and no scheduled appointment was made. Greater than 50% time during this 35-minute  visit was spent on counseling and coordination of care and discussion about her tremor and memory loss and remote stroke and answering questions Tiffany Contras, MD  Note: This document was prepared with digital dictation and possible smart phrase technology. Any transcriptional errors that result from this process are unintentional.

## 2022-01-15 ENCOUNTER — Other Ambulatory Visit: Payer: Self-pay | Admitting: Physician Assistant

## 2022-01-23 ENCOUNTER — Other Ambulatory Visit: Payer: Self-pay | Admitting: Physician Assistant

## 2022-01-23 DIAGNOSIS — I10 Essential (primary) hypertension: Secondary | ICD-10-CM

## 2022-01-27 ENCOUNTER — Telehealth: Payer: Self-pay | Admitting: Addiction (Substance Use Disorder)

## 2022-01-27 DIAGNOSIS — E039 Hypothyroidism, unspecified: Secondary | ICD-10-CM

## 2022-01-27 DIAGNOSIS — E782 Mixed hyperlipidemia: Secondary | ICD-10-CM

## 2022-01-27 DIAGNOSIS — I10 Essential (primary) hypertension: Secondary | ICD-10-CM

## 2022-01-27 NOTE — Telephone Encounter (Signed)
Pt Called and wanted to know if she needs to come in before her appt for labs so that you can have them for the appt on Thursday ?

## 2022-01-27 NOTE — Telephone Encounter (Signed)
She isn't my patient, so generally wouldn't do labs in advance. I did briefly review her chart, and she is due for lipids, so needs to be fasting. If she can come fasting to her appt (11:15), then we can draw labs at visit.  If she doesn't want to fast that long, then we need to enter future orders and can schedule a lab visit (I believe she will need lipid, TSH, CBC, c-met)

## 2022-01-30 NOTE — Telephone Encounter (Signed)
Ive put in future orders as pt is coming in tomorrow for labs

## 2022-01-31 ENCOUNTER — Other Ambulatory Visit: Payer: Medicare PPO

## 2022-01-31 DIAGNOSIS — I10 Essential (primary) hypertension: Secondary | ICD-10-CM

## 2022-01-31 DIAGNOSIS — E039 Hypothyroidism, unspecified: Secondary | ICD-10-CM

## 2022-01-31 DIAGNOSIS — E782 Mixed hyperlipidemia: Secondary | ICD-10-CM

## 2022-02-01 LAB — CBC WITH DIFFERENTIAL/PLATELET
Basophils Absolute: 0.1 10*3/uL (ref 0.0–0.2)
Basos: 2 %
EOS (ABSOLUTE): 0.2 10*3/uL (ref 0.0–0.4)
Eos: 3 %
Hematocrit: 36.3 % (ref 34.0–46.6)
Hemoglobin: 12.2 g/dL (ref 11.1–15.9)
Immature Grans (Abs): 0 10*3/uL (ref 0.0–0.1)
Immature Granulocytes: 0 %
Lymphocytes Absolute: 1.8 10*3/uL (ref 0.7–3.1)
Lymphs: 28 %
MCH: 32.7 pg (ref 26.6–33.0)
MCHC: 33.6 g/dL (ref 31.5–35.7)
MCV: 97 fL (ref 79–97)
Monocytes Absolute: 0.9 10*3/uL (ref 0.1–0.9)
Monocytes: 13 %
Neutrophils Absolute: 3.5 10*3/uL (ref 1.4–7.0)
Neutrophils: 54 %
Platelets: 232 10*3/uL (ref 150–450)
RBC: 3.73 x10E6/uL — ABNORMAL LOW (ref 3.77–5.28)
RDW: 13.3 % (ref 11.7–15.4)
WBC: 6.4 10*3/uL (ref 3.4–10.8)

## 2022-02-01 LAB — COMPREHENSIVE METABOLIC PANEL
ALT: 14 IU/L (ref 0–32)
AST: 21 IU/L (ref 0–40)
Albumin/Globulin Ratio: 1.8 (ref 1.2–2.2)
Albumin: 4.4 g/dL (ref 3.7–4.7)
Alkaline Phosphatase: 72 IU/L (ref 44–121)
BUN/Creatinine Ratio: 22 (ref 12–28)
BUN: 15 mg/dL (ref 8–27)
Bilirubin Total: 0.6 mg/dL (ref 0.0–1.2)
CO2: 25 mmol/L (ref 20–29)
Calcium: 9.8 mg/dL (ref 8.7–10.3)
Chloride: 98 mmol/L (ref 96–106)
Creatinine, Ser: 0.67 mg/dL (ref 0.57–1.00)
Globulin, Total: 2.4 g/dL (ref 1.5–4.5)
Glucose: 86 mg/dL (ref 70–99)
Potassium: 4.6 mmol/L (ref 3.5–5.2)
Sodium: 135 mmol/L (ref 134–144)
Total Protein: 6.8 g/dL (ref 6.0–8.5)
eGFR: 84 mL/min/{1.73_m2} (ref >59)

## 2022-02-01 LAB — LIPID PANEL
Chol/HDL Ratio: 2.8 ratio (ref 0.0–4.4)
Cholesterol, Total: 220 mg/dL — ABNORMAL HIGH (ref 100–199)
HDL: 79 mg/dL (ref >39)
LDL Chol Calc (NIH): 129 mg/dL — ABNORMAL HIGH (ref 0–99)
Triglycerides: 70 mg/dL (ref 0–149)
VLDL Cholesterol Cal: 12 mg/dL (ref 5–40)

## 2022-02-01 LAB — TSH: TSH: 1.77 u[IU]/mL (ref 0.450–4.500)

## 2022-02-01 NOTE — Patient Instructions (Incomplete)
I recommend getting the new RSV vaccine--you will need to get this from the pharmacy. Updated COVID booster is also recommended. Vaccines should be separated from each other by at least 2 weeks.  If you didn't get a tetanus booster in July when you had the bite, then you need to get one. Last one we have on file for you was in 2012. Either check with your insurance to see if they paid for a tetanus booster from the urgent care visit, or see if you can find out from the urgent care if they gave you one.  If they did not, you need one.  Start taking rosuvastatin '10mg'$  once daily. Continue your coenzyme Q10. Please contact us if you develop any side effects or issues from this medication. This is a statin, which is to help lower your cholesterol and prevent cardiovascular disease (due to your known history of stroke and atherosclerosis).  Please check your blood pressure  more regularly over the next month. Record it on the log sheet provided. Bring that sheet with you to your cardiology visit and also give [place] a copy of it within the next month. Always write your blood pressure down and bring the list of values to your visits. If your blood pressure is consistently elevated, then we may need to increase the dose of medication.  You can wait another year for your next bone density test (2 years from the last one, will be 02/2023).  Please cancel the one scheduled for Saturday.  Please stop taking the fish oil--there is an increased bleeding risk and you are on blood thinners already. If you feel the need to continue the fish oil, you should discuss this risk with your cardiologist or a pharmacist.  For your pain in the hip/buttock--use heat for 15-20 minutes at least 3 times per day. Do the stretches as shown after applying heat. You can try topical medications such as Biofreeze, SalonPas with lidocaine or Voltaren gel. You can also use tylenol as needed for pain.

## 2022-02-01 NOTE — Progress Notes (Signed)
Chief Complaint  Patient presents with   Hypertension    Nonfasting med check. Strained muscle Monday lower right side back helping daughter move mattress, looking for any suggestions. She does not recall getting a tetanus at the UC when she got the cat bite, also looked in [place] and did not see one. No longer sees rheum. Has some little white spots she would like you to look at on her hand-saw Dr Delman Cheadle yesterday and she forgot to tell her. Right shoulder pain, thinks it may be arthritis.     88 yo female with h/o CVA, mild cognitive impairment, mild tremor, atrial fib, HTN, PMR, hypothyroidism, hyperlipidemia presents for f/u on chronic problems. She also has acute complaint of back pain. She had labs drawn prior to visit, see below.  9/25 she was straining to hold up a mattress.  She felt pain immediately in her low back.  She continues to have pain with position changes.  No pain when she is still, or walking, just with getting up and down from a chair.   Pain is in the R side, in the upper buttock. NO radiation of the pain.  HTN:  She reports compliance with amlodipine 7.'5mg'$  daily.  Denies missed doses.   She brought in her monitor today to be verified, and it is accurate. BP's are running 112-167/54-88, though they have all been >139 in the last 5 readings.  Prior to these, there is wide variability, pt reports lower values in the morning, higher later in the day. Pulse ranges from 47-61. Her monitor hasn't been verified as accurate.  Denies headaches, dizziness, chest pain, shortness of breath, edema. She notes only occasional swelling in the L ankle. BP Readings from Last 3 Encounters:  02/02/22 (!) 152/80  01/11/22 (!) 168/71  11/08/21 (!) 147/88   H/o CVA and atrial fibrillation.  She takes Eliquis and denies bleeding.  She has appt with Dr. Caryl Comes next month. She saw Dr. Leonie Man earlier this month.    PMR--no longer sees a rheumatologist. This resolved after about a year. She  continues to have a lot of joint pains due to arthritis, but she didn't feel there was any benefit to continue to see a rheumalogist.   Hypothyroidism- reports compliance in taking levothyroxine on an empty stomach, separated from other meds and about 30 minutes prior to eating her breakfast. She denies changes to hair/skin/nails/bowel/moods/energy.   Hyperlipidemia and Aortic atherosclerosis noted on CT 11/2019.  She has previously declined statin.  Goal LDL <70 due to h/o CVA. Pt has declined statin in past. She recalls taking a statin in the past (thinks lipitor), recalls "not liking" it, and so declined it in the future. She is now willing to try a different one. She tries to eat a low cholesterol diet.  Osteopenia- taking vitamin D, weight bearing exercises, calcium in diet. She states that she is scheduled for DEXA on Saturday and is asking for an order for [facility] Review of chart--last DEXA was 02/04/2021.  PMH, PSH. SH reviewed  Outpatient Encounter Medications as of 02/02/2022  Medication Sig Note   acetaminophen (TYLENOL) 500 MG tablet Take 500-1,000 mg by mouth See admin instructions. Take 2 tablets (1000 mg) by mouth daily at bedtime, may also take 1 tablet (500 mg) in the morning as needed for pain 02/02/2022: Takes 2 every night   amLODipine (NORVASC) 5 MG tablet TAKE 1 AND 1/2 TABLETS(7.5 MG) BY MOUTH DAILY    apixaban (ELIQUIS) 2.5 MG TABS tablet TAKE  1 TABLET(2.5 MG) BY MOUTH TWICE DAILY    Coenzyme Q10 (COQ-10 PO) Take 1 capsule by mouth daily with breakfast.    dofetilide (TIKOSYN) 250 MCG capsule Take 1 capsule (250 mcg total) by mouth 2 (two) times daily.    levothyroxine (SYNTHROID) 100 MCG tablet TAKE 1 TABLET(100 MCG) BY MOUTH DAILY    Magnesium Oxide 200 MG TABS Take 1 tablet (200 mg total) by mouth daily.    Multiple Vitamins-Iron (MULTIVITAMINS WITH IRON) TABS tablet Take 1 tablet by mouth daily with breakfast.    NON FORMULARY Place 1 drop into both eyes every morning.  Sodium Chloride eye drops    Omega-3 Fatty Acids (FISH OIL PO) Take 1 capsule by mouth in the morning and at bedtime. 01/27/2021: BID   Vitamin D, Cholecalciferol, 1000 UNITS TABS Take 1,000 Units by mouth daily with breakfast.    [DISCONTINUED] Propylene Glycol (SYSTANE BALANCE OP) Place 1 drop into both eyes in the morning and at bedtime. 01/27/2021: Sodium chloride   diclofenac Sodium (VOLTAREN) 1 % GEL Apply 2 g topically 4 (four) times daily. (Patient not taking: Reported on 02/02/2022) 02/02/2022: rarely   No facility-administered encounter medications on file as of 02/02/2022.   ROS: no fever, chills, URI symptoms, chest pain, shortness of breath, headaches, dizziness.  No GI complaints.  +multiple arthritis complaints.  R LBP per HPI. No numbness, tingling, weakness, leg pain, bowel/bladder dysfunction. See HPI.   PHYSICAL EXAM:  BP (!) 152/80   Pulse (!) 52   Ht '5\' 4"'$  (1.626 m)   Wt 119 lb 12.8 oz (54.3 kg)   BMI 20.56 kg/m   152/70 on repeat by MD  Wt Readings from Last 3 Encounters:  02/02/22 119 lb 12.8 oz (54.3 kg)  01/11/22 122 lb (55.3 kg)  11/08/21 123 lb 7.3 oz (56 kg)   Thin, elderly female, in moderate discomfort when getting up from and into chair.  Comfortable at rest HEENT: conjunctiva and sclera are clear, EOMI. Neck: no lymphadenopathy, thyromegaly or carotid bruit Heart: regular rate and rhythm Lungs: clear bilaterally Back: no CVA tenderness. Spine and SI joints nontender. Pain is in the deep buttock muscles, somewhat laterally, on the right. Some pain with stretching of gluteus medius and pyriformis. Neuro: normal speech, cranial nerves grossly intact, normal gait. DTR's symmetric, normal strength. Psych: normal mood, affect, hygiene and grooming   Lab Results  Component Value Date   TSH 1.770 01/31/2022   Fasting glu 86    Chemistry      Component Value Date/Time   NA 135 01/31/2022 0842   K 4.6 01/31/2022 0842   CL 98 01/31/2022 0842   CO2  25 01/31/2022 0842   BUN 15 01/31/2022 0842   CREATININE 0.67 01/31/2022 0842   CREATININE 0.75 12/05/2019 1411   CREATININE 0.65 03/20/2017 0946      Component Value Date/Time   CALCIUM 9.8 01/31/2022 0842   ALKPHOS 72 01/31/2022 0842   AST 21 01/31/2022 0842   AST 32 12/05/2019 1411   ALT 14 01/31/2022 0842   ALT 41 12/05/2019 1411   BILITOT 0.6 01/31/2022 0842   BILITOT 0.6 12/05/2019 1411     Lab Results  Component Value Date   CHOL 220 (H) 01/31/2022   HDL 79 01/31/2022   LDLCALC 129 (H) 01/31/2022   TRIG 70 01/31/2022   CHOLHDL 2.8 01/31/2022   Lab Results  Component Value Date   WBC 6.4 01/31/2022   HGB 12.2 01/31/2022   HCT 36.3  01/31/2022   MCV 97 01/31/2022   PLT 232 01/31/2022    ASSESSMENT/PLAN:  Essential hypertension - BP's have been running high; pain contrib today. To monitor at home, NV to check monitor; adjust dose if remains above goal  Hypothyroidism, unspecified type - adequately replaced. Cont current dose of levothyroxine. Reviewed proper timing/how to take - Plan: levothyroxine (SYNTHROID) 100 MCG tablet  Atrial fibrillation, unspecified type ([place]) - cont anticoag, f/u with cardiology next month as planned  History of CVA (cerebrovascular accident) without residual deficits - saw neuro earlier this month. Mild cognitive impairment and intention tremor L noted. Start statin to try and get LDL <70 - Plan: rosuvastatin (CRESTOR) 10 MG tablet  Mixed hyperlipidemia - goal LDL <70. Excellent HDL. reviewed low cholesterol diet. Pt willing to try a different statin, to contact us if any SE - Plan: rosuvastatin (CRESTOR) 10 MG tablet  Aortic atherosclerosis ([place]) - noted on CT 11/2019. Start statin today - Plan: rosuvastatin (CRESTOR) 10 MG tablet  Chronic anticoagulation - on eliquis for afib. Denies bleeding. Advised of bleeding risks with fish oil, and no clear indication for her to remain on this--rec d/c omega-3  Need for influenza vaccination -  Plan: Flu Vaccine QUAD High Dose(Fluad)  Acute right-sided low back pain without sciatica - muscle strain.  Shown stretches. Discussed heat, massage, topical medications  Osteopenia, unspecified location - Rec waiting another year to repeat DEXA (q2 yrs); cont Ca, D, weigh-bearing exercise  Vaccine counseling - discussed recs re: flu, COVID, RSV. If no tetanus with recent cat bite, also needs TdaP, past due  Rosuvastatin '10mg'$ --discussed potential SE, and to contact us if any develop so that we can try alternative dosing.  To check with her insurance for claim vs with Urgent Care she went to and see if she had tetanus shot. If she didn't, to get from pharmacy. Timing of all vaccines reviewed--COVID, RSV, TdaP.  HD flu shot given today.  Amlod and eliquis recently refilled Has 1 RF left on thyroid med   Follow up for CPE/AWV with new provider in 3 mos. Will be due for recheck of lipids/LFT's at that time.  I spent 50 minutes dedicated to the care of this patient, including pre-visit review of records, face to face time, post-visit ordering of testing and documentation.

## 2022-02-02 ENCOUNTER — Encounter: Payer: Self-pay | Admitting: Family Medicine

## 2022-02-02 ENCOUNTER — Ambulatory Visit: Payer: Medicare PPO | Admitting: Family Medicine

## 2022-02-02 VITALS — BP 152/70 | HR 52 | Ht 64.0 in | Wt 119.8 lb

## 2022-02-02 DIAGNOSIS — E039 Hypothyroidism, unspecified: Secondary | ICD-10-CM

## 2022-02-02 DIAGNOSIS — I4891 Unspecified atrial fibrillation: Secondary | ICD-10-CM | POA: Diagnosis not present

## 2022-02-02 DIAGNOSIS — Z7901 Long term (current) use of anticoagulants: Secondary | ICD-10-CM

## 2022-02-02 DIAGNOSIS — M8580 Other specified disorders of bone density and structure, unspecified site: Secondary | ICD-10-CM

## 2022-02-02 DIAGNOSIS — Z23 Encounter for immunization: Secondary | ICD-10-CM | POA: Diagnosis not present

## 2022-02-02 DIAGNOSIS — I10 Essential (primary) hypertension: Secondary | ICD-10-CM

## 2022-02-02 DIAGNOSIS — Z8673 Personal history of transient ischemic attack (TIA), and cerebral infarction without residual deficits: Secondary | ICD-10-CM | POA: Diagnosis not present

## 2022-02-02 DIAGNOSIS — E782 Mixed hyperlipidemia: Secondary | ICD-10-CM

## 2022-02-02 DIAGNOSIS — M545 Low back pain, unspecified: Secondary | ICD-10-CM

## 2022-02-02 DIAGNOSIS — Z7185 Encounter for immunization safety counseling: Secondary | ICD-10-CM

## 2022-02-02 DIAGNOSIS — I700 Atherosclerosis of aorta: Secondary | ICD-10-CM

## 2022-02-02 MED ORDER — LEVOTHYROXINE SODIUM 100 MCG PO TABS: ORAL_TABLET | ORAL | 3 refills | Status: DC

## 2022-02-02 MED ORDER — ROSUVASTATIN CALCIUM 10 MG PO TABS: 10.0000 mg | ORAL_TABLET | Freq: Every day | ORAL | 2 refills | Status: DC

## 2022-02-03 ENCOUNTER — Encounter: Payer: Medicare PPO | Admitting: Physician Assistant

## 2022-02-10 ENCOUNTER — Telehealth: Payer: Self-pay | Admitting: *Deleted

## 2022-02-10 NOTE — Telephone Encounter (Signed)
Patient called asking for a referral to be faxed to [facility] PT for right sided LBP. She said she sees Brad there.

## 2022-02-10 NOTE — Telephone Encounter (Signed)
Faxed and patient advised

## 2022-02-10 NOTE — Telephone Encounter (Signed)
Ok fo referral for LBP

## 2022-02-14 ENCOUNTER — Encounter: Payer: Self-pay | Admitting: Internal Medicine

## 2022-02-28 ENCOUNTER — Encounter: Payer: Self-pay | Admitting: Family Medicine

## 2022-03-07 ENCOUNTER — Ambulatory Visit: Payer: Medicare PPO | Attending: Internal Medicine | Admitting: Internal Medicine

## 2022-03-07 ENCOUNTER — Encounter: Payer: Self-pay | Admitting: Internal Medicine

## 2022-03-07 VITALS — BP 132/68 | HR 58 | Ht 64.0 in | Wt 122.8 lb

## 2022-03-07 DIAGNOSIS — I4819 Other persistent atrial fibrillation: Secondary | ICD-10-CM

## 2022-03-07 NOTE — Progress Notes (Signed)
Patient Care Team: [name] as PCP - General (Physician Assistant) Baldwin Jamaica, PA-C as Physician Assistant (Cardiology)   HPI  [name] is a 88 y.o. female Seen in follow-up for atrial fibrillation with asymptomatic termination pauses.  She has a history of cryptogenic stroke.  Anticoagulated with apixoban     Admitted 6/22 for dofetilide.  1 episode of interval atrial fibrillation of which she is aware, but only lasted minutes   Biggest complaint is that she is weaker than she has been.  No shortness of breath or chest pain.  No edema.  Has some muscle weakness but no muscle aches, prior history of PMR       Date Cr K Mg . Hgb TSH  3/20 0.75 6.1>>4.8  12.4   3/21 0.75 4.6  12.5   9/22 0.68 4.9 2.2 (6/22) 12.7 3.05 (12/22) 2  9/23 0.67 4.6  12.2       DATE TEST EF   3/22 Echo   60-65 % PA press 39  6/22 Myoview     % No perfusion defects               Records and Results Reviewed0.6  Past Medical History:  Diagnosis Date   Acute CVA (cerebrovascular accident) ([place]) 03/10/2014   Anemia    hx of with surgery    Arthritis 07-27-11   osteoarthritis-hips/s/p bil. THA, arthritis right hand    Blepharitis of left eye    Blood transfusion feb 2014   post surgery some time ago   Cancer [facility]) 1989   Breast cancer -s/p lt. mastectomy, some squamous cell lesions   Cold hands    Complication of anesthesia    epinephrine - screams uncontrollably / pt does not want general anesthesia or narcotics   Constipation    Dyslipidemia 04/15/2015   Elevated LDL cholesterol level 01/26/2020   Fuchs' corneal dystrophy    both eyes   Hard of hearing    both ears mild loss   Hyperkalemia 03/07/2018   Hypertension    Hypothyroidism 3-21- 13   tx. levothyroxine   Lichen sclerosus et atrophicus of the vulva    pt reported.    Osteopenia    Stroke [facility])    a. s/p MDT LINQ 04/2014    Past Surgical History:  Procedure Laterality Date   ABDOMINAL  HYSTERECTOMY     ovaries removed   ANKLE SURGERY  2005   right ankle tendon repair   basal cell removed from face     3-4 times   BREAST SURGERY  07-1987   lt.breast cancer intraductal cancer, mastectomy   CATARACT EXTRACTION, BILATERAL  few yrs ago   Bilateral   HARDWARE REMOVAL  04/05/2012   Procedure: HARDWARE REMOVAL;  Surgeon: Gearlean Alf, MD;  Location: [facility];  Service: Orthopedics;  Laterality: Right;  Removal of Hardware Right Femur    HARDWARE REMOVAL Left 06/30/2013   Procedure: LEFT HIP HARDWARE REMOVAL OF CABLES;  Surgeon: Gearlean Alf, MD;  Location: [facility];  Service: Orthopedics;  Laterality: Left;   HARDWARE REMOVAL Right 08/21/2013   Procedure: RIGHT HIP HARDWARE REMOVAL;  Surgeon: Gearlean Alf, MD;  Location: [facility];  Service: Orthopedics;  Laterality: Right;   JOINT REPLACEMENT  07-27-11   Bilateral: Right THA - 04/2000, Left THA 06/2001   left hip replacement Left feb 2003   LOOP RECORDER IMPLANT  04-08-2014   MDT LINQ implanted by  Dr Lovena Le for cryptogenic stroke   LOOP RECORDER REMOVAL N/A 04/26/2017   Procedure: LOOP RECORDER REMOVAL;  Surgeon: Evans Lance, MD;  Location: [facility];  Service: Cardiovascular;  Laterality: N/A;   MASTECTOMY  1989   left - Pt states has no restrictions on use of L arm for BP or blood draw   melanoma removed Left    x 1   ORIF FEMUR FRACTURE  07/31/2011   Procedure: OPEN REDUCTION INTERNAL FIXATION (ORIF) DISTAL FEMUR FRACTURE;  Surgeon: Gearlean Alf, MD;  Location: [facility];  Service: Orthopedics;  Laterality: Right;  right femur  (c-arm)    SQUAMOUS CELL CARCINOMA EXCISION  07-27-11   x2 left shoulder   TOTAL HIP REVISION  04/10/2012   Procedure: TOTAL HIP REVISION;  Surgeon: Gearlean Alf, MD;  Location: [facility];  Service: Orthopedics;  Laterality: Right;   TOTAL HIP REVISION Left 06/27/2012   Procedure: LEFT TOTAL HIP REVISION;  Surgeon: Gearlean Alf, MD;  Location: [facility];  Service: Orthopedics;  Laterality:  Left;    Current Meds  Medication Sig   acetaminophen (TYLENOL) 500 MG tablet Take 500-1,000 mg by mouth See admin instructions. Take 2 tablets (1000 mg) by mouth daily at bedtime, may also take 1 tablet (500 mg) in the morning as needed for pain   amLODipine (NORVASC) 5 MG tablet TAKE 1 AND 1/2 TABLETS(7.5 MG) BY MOUTH DAILY   apixaban (ELIQUIS) 2.5 MG TABS tablet TAKE 1 TABLET(2.5 MG) BY MOUTH TWICE DAILY   Coenzyme Q10 (COQ-10 PO) Take 1 capsule by mouth daily with breakfast.   diclofenac Sodium (VOLTAREN) 1 % GEL Apply 2 g topically 4 (four) times daily. (Patient taking differently: Apply 2 g topically as needed.)   dofetilide (TIKOSYN) 250 MCG capsule Take 1 capsule (250 mcg total) by mouth 2 (two) times daily.   levothyroxine (SYNTHROID) 100 MCG tablet TAKE 1 TABLET(100 MCG) BY MOUTH DAILY   Magnesium Oxide 200 MG TABS Take 1 tablet (200 mg total) by mouth daily.   Multiple Vitamins-Iron (MULTIVITAMINS WITH IRON) TABS tablet Take 1 tablet by mouth daily with breakfast.   NON FORMULARY Place 1 drop into both eyes every morning. Sodium Chloride eye drops   rosuvastatin (CRESTOR) 10 MG tablet Take 1 tablet (10 mg total) by mouth daily.   Vitamin D, Cholecalciferol, 1000 UNITS TABS Take 1,000 Units by mouth daily with breakfast.    Allergies  Allergen Reactions   Lactose Intolerance (Gi) Other (See Comments)    Bloating, gas   Epinephrine Other (See Comments)     Reaction:  Caused pt to scream uncontrollably.      Review of Systems negative except from HPI and PMH  Physical Exam BP 132/68   Pulse (!) 58   Ht '5\' 4"'$  (1.626 m)   Wt 122 lb 12.8 oz (55.7 kg)   SpO2 97%   BMI 21.08 kg/m  Well developed and nourished in no acute distress HENT normal Neck supple with JVP-  flat   Clear Regular rate and rhythm, no murmurs or gallops Abd-soft with active BS No Clubbing cyanosis edema Skin-warm and dry A & Oriented  Grossly normal sensory and motor function in fact strong as  than ox  ECG sinus at 58 116/08/44  CrCl cannot be calculated (Patient's most recent lab result is older than the maximum 21 days allowed.).   Assessment and  Plan Atrial fibrillation-persistent now on dofetilide  High risk medication surveillance.  Hypertension  Polymyalgia rheumatica   Holding sinus rhythm on dofetilide.  QTc within range.  Potassium also within range.  Will need magnesium   No bleeding.  Continue Eliquis 2.5 mg twice daily dose for age and weight  Blood pressure varies from 110s to the 170s at home.  We have elected to not treat the systolic blood pressure more aggressively because of concerns about aggravating the low blood pressures.  She is not so interested in continuing to take daily blood pressure measurements and using these data to inform the potential of something like hydralazine, short acting to be used at night   I have asked her to follow-up with her PCP regarding weakness.  As noted above, she is really quite strong but she notes a loss and I am sure it is real   Current medicines are reviewed at length with the patient today .  The patient does not  have concerns regarding medicines.

## 2022-03-07 NOTE — Patient Instructions (Signed)
Medication Instructions:  Your physician recommends that you continue on your current medications as directed. Please refer to the Current Medication list given to you today.  *If you need a refill on your cardiac medications before your next appointment, please call your pharmacy*   Lab Work: None ordered.  If you have labs (blood work) drawn today and your tests are completely normal, you will receive your results only by: MyChart Message (if you have MyChart) OR A paper copy in the mail If you have any lab test that is abnormal or we need to change your treatment, we will call you to review the results.   Testing/Procedures: None ordered.    Follow-Up: At [facility], you and your health needs are our priority.  As part of our continuing mission to provide you with exceptional heart care, we have created designated Provider Care Teams.  These Care Teams include your primary Cardiologist (physician) and Advanced Practice Providers (APPs -  Physician Assistants and Nurse Practitioners) who all work together to provide you with the care you need, when you need it.  We recommend signing up for the patient portal called "MyChart".  Sign up information is provided on this After Visit Summary.  MyChart is used to connect with patients for Virtual Visits (Telemedicine).  Patients are able to view lab/test results, encounter notes, upcoming appointments, etc.  Non-urgent messages can be sent to your provider as well.   To learn more about what you can do with MyChart, go to [email or web address].    Your next appointment:   12 months with Dr Klein  Important Information About Sugar       

## 2022-03-18 ENCOUNTER — Encounter: Payer: Self-pay | Admitting: Family Medicine

## 2022-04-11 ENCOUNTER — Encounter: Payer: Self-pay | Admitting: Nurse Practitioner

## 2022-04-11 ENCOUNTER — Ambulatory Visit: Payer: Medicare PPO | Admitting: Nurse Practitioner

## 2022-04-11 VITALS — BP 118/80 | HR 58 | Temp 98.1°F | Ht 64.25 in | Wt 121.4 lb

## 2022-04-11 DIAGNOSIS — E785 Hyperlipidemia, unspecified: Secondary | ICD-10-CM | POA: Diagnosis not present

## 2022-04-11 DIAGNOSIS — I10 Essential (primary) hypertension: Secondary | ICD-10-CM | POA: Diagnosis not present

## 2022-04-11 DIAGNOSIS — Z7901 Long term (current) use of anticoagulants: Secondary | ICD-10-CM

## 2022-04-11 DIAGNOSIS — S81801A Unspecified open wound, right lower leg, initial encounter: Secondary | ICD-10-CM | POA: Diagnosis not present

## 2022-04-11 DIAGNOSIS — Z0000 Encounter for general adult medical examination without abnormal findings: Secondary | ICD-10-CM

## 2022-04-11 DIAGNOSIS — E039 Hypothyroidism, unspecified: Secondary | ICD-10-CM | POA: Diagnosis not present

## 2022-04-11 DIAGNOSIS — R531 Weakness: Secondary | ICD-10-CM

## 2022-04-11 DIAGNOSIS — R5383 Other fatigue: Secondary | ICD-10-CM

## 2022-04-11 LAB — POCT URINALYSIS DIP (CLINITEK)
Bilirubin, UA: NEGATIVE
Blood, UA: NEGATIVE
Glucose, UA: NEGATIVE mg/dL
Ketones, POC UA: NEGATIVE mg/dL
Nitrite, UA: NEGATIVE
POC PROTEIN,UA: NEGATIVE
Spec Grav, UA: 1.01 (ref 1.010–1.025)
Urobilinogen, UA: 0.2 E.U./dL
pH, UA: 7.5 (ref 5.0–8.0)

## 2022-04-11 MED ORDER — CEFTRIAXONE SODIUM 500 MG IJ SOLR
500.0000 mg | Freq: Once | INTRAMUSCULAR | Status: AC
  Administered 2022-04-11: 500 mg via INTRAMUSCULAR

## 2022-04-11 NOTE — Progress Notes (Signed)
[facility] [place] at [facility] [place], [place]  [address] [address] VISIT  04/11/2022  Subjective:  [name] is a 88 y.o. female patient of Kierston Plasencia, Coralee Pesa, NP who had a Medicare Annual Wellness Visit today. 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 She lives alone. she has 1 children. she reports that she is socially active and does interact with friends/family regularly. she is moderately physically active and enjoys music.  Patient Care Team: Subrina Vecchiarelli, Coralee Pesa, NP as PCP - General (Nurse Practitioner) Charlynn Grimes as Physician Assistant (Cardiology)     04/11/2022    8:21 AM 10/18/2020    1:22 PM 12/05/2019    2:33 PM 11/28/2019    1:25 PM 09/09/2015   10:01 PM 03/14/2014    8:22 PM 03/10/2014    3:40 PM  Advanced Directives  Does Patient Have a Medical Advance Directive? Yes Yes No No Yes No;Yes Yes  Type of Paramedic of [place];Living will [place];Living will   [place];Living will Healthcare Power of [place]  Does patient want to make changes to medical advance directive? Yes (ED - Information included in AVS) No - Patient declined   No - Patient declined  No - Patient declined  Copy of [place] in Chart? Yes - validated most recent copy scanned in chart (See row information) No - copy requested   No - copy requested  No - copy requested  Would patient like information on creating a medical advance directive?   No - Patient declined No - Patient declined       Hospital Utilization Over the Past 12 Months: # of hospitalizations or ER visits: 0 # of surgeries: 0  Review of Systems    Patient reports that her overall health is worse when compared to last year.  Review of Systems: Negative except MSK   Musculoskeletal ROS: muscle weakness  All other systems negative.  Pain Assessment  0/10     Current Medications & Allergies (verified) Allergies as of 04/11/2022       Reactions   Lactose Intolerance (gi) Other (See Comments)   Bloating, gas   Epinephrine Other (See Comments)    Reaction:  Caused pt to scream uncontrollably.        Medication List        Accurate as of April 11, 2022  8:32 PM. If you have any questions, ask your nurse or doctor.          acetaminophen 500 MG tablet Commonly known as: TYLENOL Take 500-1,000 mg by mouth See admin instructions. Take 2 tablets (1000 mg) by mouth daily at bedtime, may also take 1 tablet (500 mg) in the morning as needed for pain   amLODipine 5 MG tablet Commonly known as: NORVASC TAKE 1 AND 1/2 TABLETS(7.5 MG) BY MOUTH DAILY   COQ-10 PO Take 1 capsule by mouth daily with breakfast.   diclofenac Sodium 1 % Gel Commonly known as: Voltaren Apply 2 g topically 4 (four) times daily.   dofetilide 250 MCG capsule Commonly known as: TIKOSYN Take 1 capsule (250 mcg total) by mouth 2 (two) times daily.   Eliquis 2.5 MG Tabs tablet Generic drug: apixaban TAKE 1 TABLET(2.5 MG) BY MOUTH TWICE DAILY   levothyroxine 100 MCG tablet Commonly known as:  SYNTHROID TAKE 1 TABLET(100 MCG) BY MOUTH DAILY   Magnesium Oxide -Mg Supplement 200 MG Tabs Take 1 tablet (200 mg total) by mouth daily.   multivitamins with iron Tabs tablet Take 1 tablet by mouth daily with breakfast.   NON FORMULARY Place 1 drop into both eyes every morning. Sodium Chloride eye drops   rosuvastatin 10 MG tablet Commonly known as: Crestor Take 1 tablet (10 mg total) by mouth daily.   Vitamin D (Cholecalciferol) 25 MCG (1000 UT) Tabs Take 1,000 Units by mouth daily with breakfast.        History (reviewed): Past Medical History:  Diagnosis Date   Acute CVA (cerebrovascular accident) ([place]) 03/10/2014   Anemia    hx of with surgery     Arthritis 07/27/2011   osteoarthritis-hips/s/p bil. THA, arthritis right hand    Blepharitis of left eye    Blood transfusion 06/2012   post surgery some time ago   Cancer [facility]) 1989   Breast cancer -s/p lt. mastectomy, some squamous cell lesions   Cold hands    Complication of anesthesia    epinephrine - screams uncontrollably / pt does not want general anesthesia or narcotics   Constipation    Dyslipidemia 04/15/2015   Elevated LDL cholesterol level 01/26/2020   Fuchs' corneal dystrophy    both eyes   Hard of hearing    both ears mild loss   Hyperkalemia 03/07/2018   Hypertension    Hypothyroidism 3-21- 13   tx. levothyroxine   Lichen sclerosus et atrophicus of the vulva    pt reported.    Neuromuscular disorder ([place]) 01/2022   Osteopenia    Stroke [facility])    a. s/p MDT LINQ 04/2014   Past Surgical History:  Procedure Laterality Date   ABDOMINAL HYSTERECTOMY     ovaries removed   ANKLE SURGERY  05/09/2003   right ankle tendon repair   basal cell removed from face     3-4 times   BREAST SURGERY  07/07/1987   lt.breast cancer intraductal cancer, mastectomy   CATARACT EXTRACTION, BILATERAL  few yrs ago   Bilateral   EYE SURGERY  9-23   [place] REMOVAL  04/05/2012   Procedure: HARDWARE REMOVAL;  Surgeon: Gearlean Alf, MD;  Location: [facility];  Service: Orthopedics;  Laterality: Right;  Removal of Hardware Right Femur    HARDWARE REMOVAL Left 06/30/2013   Procedure: LEFT HIP HARDWARE REMOVAL OF CABLES;  Surgeon: Gearlean Alf, MD;  Location: [facility];  Service: Orthopedics;  Laterality: Left;   HARDWARE REMOVAL Right 08/21/2013   Procedure: RIGHT HIP HARDWARE REMOVAL;  Surgeon: Gearlean Alf, MD;  Location: [facility];  Service: Orthopedics;  Laterality: Right;   JOINT REPLACEMENT  07/27/2011   Bilateral: Right THA - 04/2000, Left THA 06/2001   left hip replacement Left 06/08/2001   LOOP RECORDER IMPLANT  04/08/2014   MDT LINQ implanted by Dr Lovena Le  for cryptogenic stroke   LOOP RECORDER REMOVAL N/A 04/26/2017   Procedure: LOOP RECORDER REMOVAL;  Surgeon: Evans Lance, MD;  Location: [facility];  Service: Cardiovascular;  Laterality: N/A;   MASTECTOMY  05/09/1987   left - Pt states has no restrictions on use of L arm for BP or blood draw   melanoma removed Left    x 1   ORIF FEMUR FRACTURE  07/31/2011   Procedure: OPEN REDUCTION INTERNAL FIXATION (ORIF) DISTAL FEMUR FRACTURE;  Surgeon: Gearlean Alf, MD;  Location: [facility];  Service: Orthopedics;  Laterality: Right;  right femur  (c-arm)    SQUAMOUS CELL CARCINOMA EXCISION  07/27/2011   x2 left shoulder   TOTAL HIP REVISION  04/10/2012   Procedure: TOTAL HIP REVISION;  Surgeon: Gearlean Alf, MD;  Location: [facility];  Service: Orthopedics;  Laterality: Right;   TOTAL HIP REVISION Left 06/27/2012   Procedure: LEFT TOTAL HIP REVISION;  Surgeon: Gearlean Alf, MD;  Location: [facility];  Service: Orthopedics;  Laterality: Left;   Family History  Problem Relation Age of Onset   Hodgkin's lymphoma Mother    Celiac disease Mother    Cancer Mother    63 / [place] Mother    Melanoma Father    Cancer Father    Cancer Brother    Arthritis Sister    Ulcerative colitis Sister    Depression Sister    Hearing loss Sister    Kidney disease Sister    83 / Stillbirths Sister    Heart disease Paternal Grandmother    Heart disease Paternal Grandfather    Vision loss Paternal Grandfather    Hearing loss Maternal Grandmother    Social History   Socioeconomic History   Marital status: Single    Spouse name: Not on file   Number of children: 1   Years of education: Not on file   Highest education level: Not on file  Occupational History   Occupation: Retired  Tobacco Use   Smoking status: Former    Packs/day: 1.00    Years: 10.00    Total pack years: 10.00    Types: Cigarettes    Quit date: 05/09/1963    Years since quitting: 58.9    Passive  exposure: Never   Smokeless tobacco: Never  Vaping Use   Vaping Use: Never used  Substance and Sexual Activity   Alcohol use: Not Currently    Alcohol/week: 1.0 standard drink of alcohol    Types: 1 Glasses of wine per week    Comment: very little   Drug use: No   Sexual activity: Not Currently  Other Topics Concern   Not on file  Social History Narrative   Lives alone.   Social Determinants of Health   Financial Resource Strain: Low Risk  (04/11/2022)   Overall Financial Resource Strain (CARDIA)    Difficulty of Paying Living Expenses: Not hard at all  Food Insecurity: No Food Insecurity (04/11/2022)   Hunger Vital Sign    Worried About Running Out of Food in the Last Year: Never true    Ran Out of Food in the Last Year: Never true  Transportation Needs: No Transportation Needs (04/11/2022)   PRAPARE - Hydrologist (Medical): No    Lack of Transportation (Non-Medical): No  Physical Activity: Sufficiently Active (04/11/2022)   Exercise Vital Sign    Days of Exercise per Week: 7 days    Minutes of Exercise per Session: 30 min  Stress: No Stress Concern Present (04/11/2022)   [place]    Feeling of Stress : Not at all  Social Connections: Moderately Integrated (04/11/2022)   Social Connection and Isolation Panel [NHANES]    Frequency of Communication with Friends and Family: More than three times a week    Frequency of Social Gatherings with Friends and Family: More than three times a week    Attends Religious Services: More than 4 times per year  Active Member of Clubs or Organizations: Yes    Attends Archivist Meetings: More than 4 times per year    Marital Status: Never married    Activities of Daily Living    04/11/2022    8:22 AM  In your present state of health, do you have any difficulty performing the following activities:  Hearing? 0  Vision? 1  Comment has  some floaters trouble seeing small print  Difficulty concentrating or making decisions? 0  Walking or climbing stairs? 1  Dressing or bathing? 0  Doing errands, shopping? 0  Preparing Food and eating ? N  Using the Toilet? N  In the past six months, have you accidently leaked urine? Y  Do you have problems with loss of bowel control? N  Managing your Medications? N  Managing your Finances? N  Housekeeping or managing your Housekeeping? N    Patient Education/Literacy    Exercise Current Exercise Habits: Home exercise routine, Type of exercise: walking;stretching;strength training/weights, Time (Minutes): 30, Frequency (Times/Week): 3, Weekly Exercise (Minutes/Week): 90, Intensity: Mild, Exercise limited by: None identified  Diet Patient reports consuming 3 meals a day and 0-2 snack(s) a day Patient reports that her primary diet is: Regular Patient reports that she does have regular access to food.   Depression Screen    04/11/2022    8:18 AM 08/04/2021   10:04 AM 01/27/2021   10:12 AM 10/07/2020    8:05 AM 08/26/2020    3:18 PM 01/26/2020    8:29 AM 07/24/2019    8:31 AM  PHQ 2/9 Scores  PHQ - 2 Score 0 0 0 0 0 0 0     Fall Risk    04/11/2022    8:18 AM 02/02/2022   11:31 AM 08/04/2021   10:03 AM 01/27/2021   10:11 AM 10/07/2020    8:05 AM  Fall Risk   Falls in the past year? 0 0 '1 1 1  '$ Number falls in past yr: 0 0 0 0 1  Comment    1   Injury with Fall? 0 0 0 1 1  Comment    bruised arm   Risk for fall due to : No Fall Risks No Fall Risks Impaired balance/gait History of fall(s);Impaired vision;Impaired mobility Medication side effect  Follow up Falls evaluation completed Falls evaluation completed Education provided;Falls evaluation completed Falls evaluation completed Falls evaluation completed     Objective:   BP 118/80   Pulse (!) 58   Temp 98.1 F (36.7 C)   Ht 5' 4.25" (1.632 m)   Wt 121 lb 6.4 oz (55.1 kg)   BMI 20.68 kg/m   Last Weight  Most recent  update: 04/11/2022  8:18 AM    Weight  55.1 kg (121 lb 6.4 oz)             Body mass index is 20.68 kg/m.  Hearing/Vision  [name] did not have difficulty with hearing/understanding during the face-to-face interview [name] did not have difficulty with her vision during the face-to-face interview Reports that she has had a formal eye exam by an eye care professional within the past year Reports that she has not had a formal hearing evaluation within the past year  Cognitive Function:     No data to display          Normal Cognitive Function Screening: Yes (Normal:0-7, Significant for Dysfunction: >8)  Immunization & Health Maintenance Record Immunization History  Administered Date(s) Administered   Fluad Quad(high Dose  65+) 01/26/2020, 02/02/2022   Gamma Globulin 10/09/1983, 12/18/1984, 07/24/1989   Hepatitis A 10/19/1995   Hepatitis B 06/08/2009   IPV 12/07/1969, 12/19/1983, 11/25/1984, 01/08/1986   Influenza, High Dose Seasonal PF 02/07/2017, 01/28/2018, 12/27/2018   Influenza, Seasonal, Injecte, Preservative Fre 01/19/2014   Influenza-Unspecified 01/19/2014, 01/19/2015, 12/30/2018, 01/05/2021   OPV 10/19/1995   PFIZER(Purple Top)SARS-COV-2 Vaccination 06/14/2019, 07/09/2019, 08/11/2020, 01/14/2021   Pneumococcal Conjugate-13 02/09/2015   Pneumococcal Polysaccharide-23 07/06/2001, 01/14/2019   [place], IM 11/08/2021, 11/11/2021, 11/17/2021   Td 11/13/1978, 01/04/1989, 10/19/1995, 06/08/2009   Tdap 08/26/2010   Typhoid Inactivated 10/09/1983, 11/01/1989, 11/11/2002   Typhoid Live 12/09/1995, 06/08/2009   Yellow Fever 08/26/2010   Zoster Recombinat (Shingrix) 09/15/2016, 12/20/2016   Zoster, Live 09/12/2006    Health Maintenance  Topic Date Due   DTaP/Tdap/Td (6 - Td or Tdap) 08/25/2020   COVID-19 Vaccine (5 - 2023-24 season) 01/06/2022   Medicare Annual Wellness (AWV)  01/27/2022   DEXA SCAN  02/05/2023   Pneumonia Vaccine 86+ Years old  Completed   INFLUENZA  VACCINE  Completed   Zoster Vaccines- Shingrix  Completed   HPV VACCINES  Aged Out       Assessment  This is a routine wellness examination for [name].  Health Maintenance: Due or Overdue Health Maintenance Due  Topic Date Due   DTaP/Tdap/Td (6 - Td or Tdap) 08/25/2020   COVID-19 Vaccine (5 - 2023-24 season) 01/06/2022   Medicare Annual Wellness (AWV)  01/27/2022    [name] does not need a referral for Community Assistance: Care Management:   no Social Work:    no Prescription Assistance:  no Nutrition/Diabetes Education:  no   Plan:  Personalized Goals  Goals Addressed             This Visit's Progress    Increase My Muscle Strength       Timeframe:  Long-Range Goal Priority:  High                 Follow Up Date 04/13/2023    - start slowly and increase the amount of time every week    Why is this important?   Walking and easy exercises make you stronger. These also give you Petty.  Every little bit helps, at any age.    Notes:        Personalized Health Maintenance & Screening Recommendations  Td vaccine COVID Booster  Lung Cancer Screening Recommended: no (Low Dose CT Chest recommended if Age 29-80 years, 30 pack-year currently smoking OR have quit w/in past 15 years) Hepatitis C Screening recommended: no HIV Screening recommended: no  Advanced Directives: Written information was not given per the patient's request.  Referrals & Orders Orders Placed This Encounter  Procedures   CBC with Differential/Platelet   Comprehensive metabolic panel   Lipid panel   VITAMIN D 25 Hydroxy (Vit-D Deficiency, Fractures)   T4, free   TSH   POCT URINALYSIS DIP (CLINITEK)    Follow-up Plan Follow-up with Daziya Redmond, Coralee Pesa, NP as planned  I have personally reviewed and noted the following in the patient's chart:   Medical and social history Use of alcohol, tobacco or illicit drugs  Current medications and supplements Functional ability and  status Nutritional status Physical activity Advanced directives List of other physicians Hospitalizations, surgeries, and ER visits in previous 12 months Vitals Screenings to include cognitive, depression, and falls Referrals and appointments  In addition, I have reviewed and discussed with patient certain preventive protocols, quality  metrics, and best practice recommendations. A written personalized care plan for preventive services as well as general preventive health recommendations were provided to patient.     Orma Render, DNP, AGNP-c   04/11/2022

## 2022-04-11 NOTE — Patient Instructions (Addendum)
It was a pleasure to meet you today! Thank you for sharing your story with me, you have a beautiful heart and story!  I would like you to stop the rosuvastatin for now and see if the muscle weakness improves.

## 2022-04-12 LAB — COMPREHENSIVE METABOLIC PANEL
ALT: 14 IU/L (ref 0–32)
AST: 20 IU/L (ref 0–40)
Albumin/Globulin Ratio: 1.9 (ref 1.2–2.2)
Albumin: 4.5 g/dL (ref 3.7–4.7)
Alkaline Phosphatase: 74 IU/L (ref 44–121)
BUN/Creatinine Ratio: 21 (ref 12–28)
BUN: 13 mg/dL (ref 8–27)
Bilirubin Total: 0.5 mg/dL (ref 0.0–1.2)
CO2: 23 mmol/L (ref 20–29)
Calcium: 9.3 mg/dL (ref 8.7–10.3)
Chloride: 102 mmol/L (ref 96–106)
Creatinine, Ser: 0.62 mg/dL (ref 0.57–1.00)
Globulin, Total: 2.4 g/dL (ref 1.5–4.5)
Glucose: 84 mg/dL (ref 70–99)
Potassium: 4.1 mmol/L (ref 3.5–5.2)
Sodium: 139 mmol/L (ref 134–144)
Total Protein: 6.9 g/dL (ref 6.0–8.5)
eGFR: 86 mL/min/{1.73_m2} (ref >59)

## 2022-04-12 LAB — CBC WITH DIFFERENTIAL/PLATELET
Basophils Absolute: 0.1 10*3/uL (ref 0.0–0.2)
Basos: 1 %
EOS (ABSOLUTE): 0.1 10*3/uL (ref 0.0–0.4)
Eos: 1 %
Hematocrit: 35.1 % (ref 34.0–46.6)
Hemoglobin: 11.8 g/dL (ref 11.1–15.9)
Immature Grans (Abs): 0 10*3/uL (ref 0.0–0.1)
Immature Granulocytes: 0 %
Lymphocytes Absolute: 1.4 10*3/uL (ref 0.7–3.1)
Lymphs: 21 %
MCH: 32.2 pg (ref 26.6–33.0)
MCHC: 33.6 g/dL (ref 31.5–35.7)
MCV: 96 fL (ref 79–97)
Monocytes Absolute: 1 10*3/uL — ABNORMAL HIGH (ref 0.1–0.9)
Monocytes: 15 %
Neutrophils Absolute: 3.9 10*3/uL (ref 1.4–7.0)
Neutrophils: 62 %
Platelets: 208 10*3/uL (ref 150–450)
RBC: 3.66 x10E6/uL — ABNORMAL LOW (ref 3.77–5.28)
RDW: 13 % (ref 11.7–15.4)
WBC: 6.5 10*3/uL (ref 3.4–10.8)

## 2022-04-12 LAB — LIPID PANEL
Chol/HDL Ratio: 2.2 ratio (ref 0.0–4.4)
Cholesterol, Total: 155 mg/dL (ref 100–199)
HDL: 71 mg/dL (ref >39)
LDL Chol Calc (NIH): 72 mg/dL (ref 0–99)
Triglycerides: 62 mg/dL (ref 0–149)
VLDL Cholesterol Cal: 12 mg/dL (ref 5–40)

## 2022-04-12 LAB — TSH: TSH: 0.862 u[IU]/mL (ref 0.450–4.500)

## 2022-04-12 LAB — VITAMIN D 25 HYDROXY (VIT D DEFICIENCY, FRACTURES): Vit D, 25-Hydroxy: 60.4 ng/mL (ref 30.0–100.0)

## 2022-04-12 LAB — T4, FREE: Free T4: 1.51 ng/dL (ref 0.82–1.77)

## 2022-04-16 ENCOUNTER — Encounter: Payer: Self-pay | Admitting: Nurse Practitioner

## 2022-04-17 ENCOUNTER — Other Ambulatory Visit: Payer: Self-pay | Admitting: Family Medicine

## 2022-04-21 ENCOUNTER — Encounter: Payer: Self-pay | Admitting: Nurse Practitioner

## 2022-04-22 ENCOUNTER — Other Ambulatory Visit: Payer: Self-pay | Admitting: Family Medicine

## 2022-04-22 DIAGNOSIS — I10 Essential (primary) hypertension: Secondary | ICD-10-CM

## 2022-05-15 ENCOUNTER — Other Ambulatory Visit: Payer: Self-pay | Admitting: Family Medicine

## 2022-05-15 DIAGNOSIS — Z8673 Personal history of transient ischemic attack (TIA), and cerebral infarction without residual deficits: Secondary | ICD-10-CM

## 2022-05-15 DIAGNOSIS — E782 Mixed hyperlipidemia: Secondary | ICD-10-CM

## 2022-05-15 DIAGNOSIS — I700 Atherosclerosis of aorta: Secondary | ICD-10-CM

## 2022-05-29 ENCOUNTER — Encounter: Payer: Self-pay | Admitting: Nurse Practitioner

## 2022-07-08 ENCOUNTER — Other Ambulatory Visit: Payer: Self-pay | Admitting: Nurse Practitioner

## 2022-07-08 ENCOUNTER — Other Ambulatory Visit: Payer: Self-pay | Admitting: Physician Assistant

## 2022-07-08 ENCOUNTER — Other Ambulatory Visit: Payer: Self-pay | Admitting: Family Medicine

## 2022-07-08 DIAGNOSIS — I10 Essential (primary) hypertension: Secondary | ICD-10-CM

## 2022-07-08 DIAGNOSIS — E039 Hypothyroidism, unspecified: Secondary | ICD-10-CM

## 2022-07-09 ENCOUNTER — Encounter: Payer: Self-pay | Admitting: Nurse Practitioner

## 2022-07-10 ENCOUNTER — Other Ambulatory Visit: Payer: Self-pay

## 2022-07-10 MED ORDER — DOFETILIDE 250 MCG PO CAPS: 250.0000 ug | ORAL_CAPSULE | Freq: Two times a day (BID) | ORAL | 2 refills | Status: DC

## 2022-07-15 ENCOUNTER — Encounter: Payer: Self-pay | Admitting: Internal Medicine

## 2022-07-21 ENCOUNTER — Other Ambulatory Visit: Payer: Self-pay | Admitting: Medical

## 2022-07-21 DIAGNOSIS — I10 Essential (primary) hypertension: Secondary | ICD-10-CM

## 2022-08-03 ENCOUNTER — Encounter: Payer: Self-pay | Admitting: Nurse Practitioner

## 2022-08-03 ENCOUNTER — Ambulatory Visit (INDEPENDENT_AMBULATORY_CARE_PROVIDER_SITE_OTHER): Payer: Medicare Other | Admitting: Nurse Practitioner

## 2022-08-03 VITALS — BP 126/74 | HR 50 | Wt 121.2 lb

## 2022-08-03 DIAGNOSIS — M353 Polymyalgia rheumatica: Secondary | ICD-10-CM | POA: Diagnosis not present

## 2022-08-03 MED ORDER — METHOTREXATE SODIUM 10 MG PO TABS: 10.0000 mg | ORAL_TABLET | ORAL | 3 refills | Status: DC

## 2022-08-03 NOTE — Progress Notes (Signed)
  Tollie Eth, DNP, AGNP-c [facility] [address] [place], [place] [address] [phone]  Subjective:   [name] is a 89 y.o. female presents to day for evaluation of:  [name] presents today with chief complaints of fatigue, difficulty with mobility including getting up and down, turning over, and getting in and out of the car. She reports experiencing stiffness in her legs and slow movement, as well as stiffness in her hands, occasional numbness, and episodes of temporary paralysis. Additionally, she mentions experiencing brief headaches. [name] has a medical history of polymyalgia rheumatica and expresses concern about its potential recurrence. She is seeking treatment options beyond prednisone. She also recounts an episode of atrial fibrillation that occurred approximately one month ago, which was self-limited and resolved after taking her prescribed dofetilide.   PMH, Medications, and Allergies reviewed and updated in chart as appropriate.   ROS negative except for what is listed in HPI. Objective:  BP 126/74   Pulse (!) 50   Wt 121 lb 3.2 oz (55 kg)   SpO2 97%   BMI 20.64 kg/m  Physical Exam Vitals and nursing note reviewed.  Constitutional:      Appearance: Normal appearance.  HENT:     Head: Normocephalic.  Eyes:     Pupils: Pupils are equal, round, and reactive to light.  Cardiovascular:     Rate and Rhythm: Regular rhythm. Bradycardia present.     Pulses: Normal pulses.     Heart sounds: Normal heart sounds.  Pulmonary:     Effort: Pulmonary effort is normal.     Breath sounds: Normal breath sounds.  Abdominal:     General: Bowel sounds are normal.     Palpations: Abdomen is soft.  Musculoskeletal:        General: Tenderness present.     Cervical back: Normal range of motion.     Comments: Multi-joint tenderness present on examination with widespread decreased ROM and pain.   Skin:    General: Skin is warm and dry.     Capillary Refill:  Capillary refill takes less than 2 seconds.  Neurological:     General: No focal deficit present.     Mental Status: She is alert and oriented to person, place, and time.  Psychiatric:        Mood and Affect: Mood normal.           Assessment & Plan:   Problem List Items Addressed This Visit     PMR (polymyalgia rheumatica) - Primary    The patient is experiencing stiffness, difficulty with mobility, and fatigue, which may indicate a recurrence of polymyalgia rheumatica. The patient's condition today warrants attention. We discussed medication options that may be helpful for management of symptoms outside the realm of steroids. Joint decision made to trial methotrexate to see if this is helpful with management of symptoms. Kidney and liver function evaluated and show normal results.  Plan: - Start methotrexate 10 mg once weekly as an alternative to prednisone. - Monitor symptom improvement and side effects. - Consider rheumatologist referral if no improvement or worsening of symptoms. - Repeat CMP and CBC in 3 months      Relevant Medications   methotrexate (RHEUMATREX) 10 MG tablet      Tollie Eth, DNP, AGNP-c 08/14/2022  7:37 PM   Time: 40 minutes, >50% spent counseling, care coordination, chart review, and documentation.   History, Medications, Surgery, SDOH, and Family History reviewed and updated as appropriate.

## 2022-08-10 ENCOUNTER — Encounter: Payer: Self-pay | Admitting: Internal Medicine

## 2022-08-11 NOTE — Telephone Encounter (Signed)
There are no drug interactions with the methotrexate and her heart medications; will let Dr. Graciela Husbands give any opinion on the other parts of the question

## 2022-08-14 ENCOUNTER — Encounter: Payer: Self-pay | Admitting: Nurse Practitioner

## 2022-08-14 DIAGNOSIS — M353 Polymyalgia rheumatica: Secondary | ICD-10-CM | POA: Insufficient documentation

## 2022-08-14 HISTORY — DX: Polymyalgia rheumatica: M35.3

## 2022-08-14 NOTE — Assessment & Plan Note (Signed)
The patient is experiencing stiffness, difficulty with mobility, and fatigue, which may indicate a recurrence of polymyalgia rheumatica. The patient's condition today warrants attention. We discussed medication options that may be helpful for management of symptoms outside the realm of steroids. Joint decision made to trial methotrexate to see if this is helpful with management of symptoms. Kidney and liver function evaluated and show normal results.  Plan: - Start methotrexate 10 mg once weekly as an alternative to prednisone. - Monitor symptom improvement and side effects. - Consider rheumatologist referral if no improvement or worsening of symptoms. - Repeat CMP and CBC in 3 months

## 2022-08-15 ENCOUNTER — Other Ambulatory Visit: Payer: Self-pay

## 2022-08-15 DIAGNOSIS — M353 Polymyalgia rheumatica: Secondary | ICD-10-CM

## 2022-08-22 ENCOUNTER — Encounter: Payer: Self-pay | Admitting: Internal Medicine

## 2022-08-22 ENCOUNTER — Encounter: Payer: Self-pay | Admitting: Nurse Practitioner

## 2022-08-22 DIAGNOSIS — R5382 Chronic fatigue, unspecified: Secondary | ICD-10-CM | POA: Diagnosis not present

## 2022-08-22 DIAGNOSIS — M353 Polymyalgia rheumatica: Secondary | ICD-10-CM | POA: Diagnosis not present

## 2022-08-23 NOTE — Telephone Encounter (Signed)
I tried to call her but got no answer and left VM

## 2022-09-12 DIAGNOSIS — H43813 Vitreous degeneration, bilateral: Secondary | ICD-10-CM | POA: Diagnosis not present

## 2022-09-12 DIAGNOSIS — H02834 Dermatochalasis of left upper eyelid: Secondary | ICD-10-CM | POA: Diagnosis not present

## 2022-09-12 DIAGNOSIS — Z961 Presence of intraocular lens: Secondary | ICD-10-CM | POA: Diagnosis not present

## 2022-09-12 DIAGNOSIS — H26492 Other secondary cataract, left eye: Secondary | ICD-10-CM | POA: Diagnosis not present

## 2022-09-12 DIAGNOSIS — H02831 Dermatochalasis of right upper eyelid: Secondary | ICD-10-CM | POA: Diagnosis not present

## 2022-09-12 DIAGNOSIS — H04123 Dry eye syndrome of bilateral lacrimal glands: Secondary | ICD-10-CM | POA: Diagnosis not present

## 2022-09-23 ENCOUNTER — Encounter: Payer: Self-pay | Admitting: Nurse Practitioner

## 2022-09-25 DIAGNOSIS — M25519 Pain in unspecified shoulder: Secondary | ICD-10-CM | POA: Diagnosis not present

## 2022-09-25 DIAGNOSIS — M79606 Pain in leg, unspecified: Secondary | ICD-10-CM | POA: Diagnosis not present

## 2022-09-26 ENCOUNTER — Encounter: Payer: Self-pay | Admitting: Medical

## 2022-09-26 ENCOUNTER — Ambulatory Visit (INDEPENDENT_AMBULATORY_CARE_PROVIDER_SITE_OTHER): Payer: Medicare Other | Admitting: Medical

## 2022-09-26 VITALS — BP 128/78 | HR 63 | Temp 98.0°F | Ht 64.0 in | Wt 119.2 lb

## 2022-09-26 DIAGNOSIS — S50862A Insect bite (nonvenomous) of left forearm, initial encounter: Secondary | ICD-10-CM | POA: Diagnosis not present

## 2022-09-26 DIAGNOSIS — W57XXXA Bitten or stung by nonvenomous insect and other nonvenomous arthropods, initial encounter: Secondary | ICD-10-CM

## 2022-09-26 DIAGNOSIS — Z7185 Encounter for immunization safety counseling: Secondary | ICD-10-CM | POA: Diagnosis not present

## 2022-09-26 MED ORDER — DOXYCYCLINE HYCLATE 100 MG PO TABS: 100.0000 mg | ORAL_TABLET | Freq: Once | ORAL | 0 refills | Status: AC

## 2022-09-26 MED ORDER — TRIAMCINOLONE ACETONIDE 0.1 % EX CREA: 1.0000 | TOPICAL_CREAM | Freq: Two times a day (BID) | CUTANEOUS | 0 refills | Status: DC

## 2022-09-26 NOTE — Patient Instructions (Addendum)
Recommendations; Begin Benadryl over the counter for 3-5 days for the redness and reaction on the left forearm. Use Benadryl either 12.5mg  or 25mg  once or twice daily for 3-5 days.   Caution as benadryl can cause sedation/drowsiness Begin Triamcinolone cream to the 2 bites areas for the next week or 2 to help clear up rash and itching Begin oral antibiotic Doxycycline as a prophylactic measure If any new or worse symptoms in the next few weeks, particularly fever, joints aches, joint swelling, extreme fatigue, then recheck Get your Tdap tetanus booster at your pharmacy and have them send [place] documentation

## 2022-09-26 NOTE — Progress Notes (Signed)
Subjective:  [name] is a 89 y.o. female who presents for Chief Complaint  Patient presents with   Tick Bites    Has two tick bites. One in genital area that she is not very worried about. Other is on left forearm and very itchy and she believes she head may still be in her arm.      Here for tick bites.  Found ticks on her body Friday 4 days ago.   But thinks they bit her the day before.  She pulled both off.  One was on left inguinal area, one was on left forearm.  She pulled both off but was concerned about the left arm having some tick parts in the arm still.    She is concerned about itching, swelling of left arm and worried about lyme disease.  Using anti-itch cream.   No fever, no body aches.    She does note hx/o RMSF summer of 2021.  Also has questions about vaccines.  No other aggravating or relieving factors.    No other c/o.  The following portions of the patient's history were reviewed and updated as appropriate: allergies, current medications, past family history, past medical history, past social history, past surgical history and problem list.  ROS Otherwise as in subjective above    Objective: BP 128/78   Pulse 63   Temp 98 F (36.7 C) (Tympanic)   Ht 5\' 4"  (1.626 m)   Wt 119 lb 3.2 oz (54.1 kg)   SpO2 99%   BMI 20.46 kg/m   General appearance: alert, no distress, well developed, well nourished Left forearm anteriorly with a small area approximately 1 cm diameter erythematous slightly raised indurated skin, no warmth, no other redness but there is some puffy swelling approximately 3 cm diameter around this area Arm is nontender, normal range of motion There is a small papular 3 mm raised erythematous area of the left hip anterior just lateral to the inguinal crease without erythema or surrounding swelling or tenderness    Assessment: Encounter Diagnoses  Name Primary?   Tick bite, unspecified site, initial encounter Yes   Vaccine counseling       Plan: Fortunately the ticks were on her body less than 24 hours.  She removed those ticks.  She had the ticks in a bag today.  The tick that was on her arm was a [place] tick, the one on her leg was a deer tick.  We discussed the following recommendations.  We discussed symptoms of tickborne illness that would prompt a recheck  Patient Instructions  Recommendations; Begin Benadryl over the counter for 3-5 days for the redness and reaction on the left forearm. Use Benadryl either 12.5mg  or 25mg  once or twice daily for 3-5 days.   Caution as benadryl can cause sedation/drowsiness Begin Triamcinolone cream to the 2 bites areas for the next week or 2 to help clear up rash and itching Begin oral antibiotic Doxycycline as a prophylactic measure If any new or worse symptoms in the next few weeks, particularly fever, joints aches, joint swelling, extreme fatigue, then recheck Get your Tdap tetanus booster at your pharmacy and have them send [place] documentation     Shirell was seen today for tick bites.  Diagnoses and all orders for this visit:  Tick bite, unspecified site, initial encounter  Vaccine counseling  Other orders -     triamcinolone cream (KENALOG) 0.1 %; Apply 1 Application topically 2 (two) times daily. -  doxycycline (VIBRA-TABS) 100 MG tablet; Take 1 tablet (100 mg total) by mouth once for 1 dose.    Follow up: prn

## 2022-09-28 DIAGNOSIS — M25519 Pain in unspecified shoulder: Secondary | ICD-10-CM | POA: Diagnosis not present

## 2022-09-28 DIAGNOSIS — M79606 Pain in leg, unspecified: Secondary | ICD-10-CM | POA: Diagnosis not present

## 2022-10-05 DIAGNOSIS — M25519 Pain in unspecified shoulder: Secondary | ICD-10-CM | POA: Diagnosis not present

## 2022-10-05 DIAGNOSIS — M79606 Pain in leg, unspecified: Secondary | ICD-10-CM | POA: Diagnosis not present

## 2022-10-09 DIAGNOSIS — M79606 Pain in leg, unspecified: Secondary | ICD-10-CM | POA: Diagnosis not present

## 2022-10-09 DIAGNOSIS — M25519 Pain in unspecified shoulder: Secondary | ICD-10-CM | POA: Diagnosis not present

## 2022-10-11 DIAGNOSIS — M25519 Pain in unspecified shoulder: Secondary | ICD-10-CM | POA: Diagnosis not present

## 2022-10-11 DIAGNOSIS — M79606 Pain in leg, unspecified: Secondary | ICD-10-CM | POA: Diagnosis not present

## 2022-10-17 DIAGNOSIS — M25519 Pain in unspecified shoulder: Secondary | ICD-10-CM | POA: Diagnosis not present

## 2022-10-17 DIAGNOSIS — M79606 Pain in leg, unspecified: Secondary | ICD-10-CM | POA: Diagnosis not present

## 2022-10-19 DIAGNOSIS — M79606 Pain in leg, unspecified: Secondary | ICD-10-CM | POA: Diagnosis not present

## 2022-10-19 DIAGNOSIS — M25519 Pain in unspecified shoulder: Secondary | ICD-10-CM | POA: Diagnosis not present

## 2022-12-07 ENCOUNTER — Other Ambulatory Visit: Payer: Self-pay | Admitting: Medical

## 2022-12-07 ENCOUNTER — Other Ambulatory Visit: Payer: Self-pay | Admitting: Nurse Practitioner

## 2022-12-07 DIAGNOSIS — I10 Essential (primary) hypertension: Secondary | ICD-10-CM

## 2022-12-07 DIAGNOSIS — E039 Hypothyroidism, unspecified: Secondary | ICD-10-CM

## 2022-12-28 LAB — HM MAMMOGRAPHY

## 2023-01-02 ENCOUNTER — Encounter: Payer: Self-pay | Admitting: Nurse Practitioner

## 2023-01-22 ENCOUNTER — Encounter: Payer: Self-pay | Admitting: Nurse Practitioner

## 2023-01-22 DIAGNOSIS — H18519 Endothelial corneal dystrophy, unspecified eye: Secondary | ICD-10-CM | POA: Insufficient documentation

## 2023-01-23 ENCOUNTER — Telehealth: Payer: Self-pay | Admitting: Nurse Practitioner

## 2023-01-31 ENCOUNTER — Ambulatory Visit: Payer: Medicare Other | Admitting: Podiatry

## 2023-01-31 ENCOUNTER — Encounter: Payer: Self-pay | Admitting: Podiatry

## 2023-01-31 ENCOUNTER — Other Ambulatory Visit: Payer: Medicare Other

## 2023-01-31 VITALS — BP 173/71 | HR 54

## 2023-01-31 DIAGNOSIS — R2681 Unsteadiness on feet: Secondary | ICD-10-CM

## 2023-01-31 DIAGNOSIS — M21752 Unequal limb length (acquired), left femur: Secondary | ICD-10-CM | POA: Diagnosis not present

## 2023-02-01 NOTE — Progress Notes (Signed)
Subjective:   Patient ID: [name], female   DOB: 89 y.o.   MRN: [id number]   HPI Patient presents stating that she has pain in both feet trouble walking and a big limb length discrepancy and states that it is gradually become more of an issue for her from an ambulation perspective.  Patient does not smoke and tries to be active   Review of Systems  All other systems reviewed and are negative.       Objective:  Physical Exam Vitals and nursing note reviewed.  Constitutional:      Appearance: She is well-developed.  Pulmonary:     Effort: Pulmonary effort is normal.  Musculoskeletal:        General: Normal range of motion.  Skin:    General: Skin is warm.  Neurological:     Mental Status: She is alert.     Neurovascular status intact muscle strength was found to be mildly reduced range of motion reduced with severe limb length discrepancy with the left leg being 1/2 inch shorter than the right with abnormal gait and generalized pain in her feet with flatfoot like deformity.  Patient did have good digital perfusion well-oriented     Assessment:  Chronic pain secondary to foot structure and severe limb length discrepancy     Plan:  H&P reviewed she is trying to stay active and does have instability and I think would do well with an orthotic to try to rebalance her and take some of the pressure off her feet.  We did make a temporary heel lift but I do think a fixed device would do better and especially would provide for better support during the gait cycle

## 2023-02-08 ENCOUNTER — Ambulatory Visit: Payer: Medicare Other | Admitting: Nurse Practitioner

## 2023-03-13 ENCOUNTER — Other Ambulatory Visit: Payer: Self-pay | Admitting: Internal Medicine

## 2023-03-15 ENCOUNTER — Telehealth: Payer: Self-pay | Admitting: Nurse Practitioner

## 2023-03-15 NOTE — Telephone Encounter (Signed)
Spoke with patient to schedule AWV on 04/17/23.  She wanted to know if she could have labs done this day.  She wanted to have the results when she sees you on 12/12

## 2023-03-16 ENCOUNTER — Other Ambulatory Visit: Payer: Self-pay

## 2023-03-16 DIAGNOSIS — E559 Vitamin D deficiency, unspecified: Secondary | ICD-10-CM

## 2023-03-16 DIAGNOSIS — Z0000 Encounter for general adult medical examination without abnormal findings: Secondary | ICD-10-CM

## 2023-03-16 DIAGNOSIS — E039 Hypothyroidism, unspecified: Secondary | ICD-10-CM

## 2023-03-24 ENCOUNTER — Other Ambulatory Visit: Payer: Self-pay | Admitting: Family Medicine

## 2023-03-24 DIAGNOSIS — E039 Hypothyroidism, unspecified: Secondary | ICD-10-CM

## 2023-03-26 ENCOUNTER — Ambulatory Visit: Payer: Medicare Other

## 2023-03-26 NOTE — Progress Notes (Signed)
Patient was in and scanned for left only orthotic with 1/2" lift attached  Measured patient at 1 1/8- 1 1/4 Left LLD left shorter  Dr believes that left orthotic will help restore symmetry and balance also help provide total contact to MLA reducing plantar pressure and pain  Also showed patient the even up shoe on line as her LLD discrepancy is to much to go inside shoe and she may use Even up shoe to create total lift in conjunction with heel lift  Scanned today and order placed for Left orthotic patient also wanted little to no padding and firm support with CFO Addison Bailey Cped, CFo, CFm

## 2023-04-02 ENCOUNTER — Telehealth: Payer: Self-pay | Admitting: Nurse Practitioner

## 2023-04-02 NOTE — Telephone Encounter (Signed)
Appointment changed to phone patient aware

## 2023-04-02 NOTE — Telephone Encounter (Signed)
[name] called back and would like her 04/17/2023 to be on the phone she will not have a ride on that day

## 2023-04-04 ENCOUNTER — Ambulatory Visit: Payer: Medicare Other | Attending: Internal Medicine | Admitting: Internal Medicine

## 2023-04-04 ENCOUNTER — Encounter: Payer: Self-pay | Admitting: Internal Medicine

## 2023-04-04 ENCOUNTER — Ambulatory Visit: Payer: Medicare Other | Admitting: Internal Medicine

## 2023-04-04 VITALS — BP 154/80 | HR 57 | Ht 64.0 in | Wt 122.2 lb

## 2023-04-04 DIAGNOSIS — I4819 Other persistent atrial fibrillation: Secondary | ICD-10-CM

## 2023-04-04 DIAGNOSIS — Z79899 Other long term (current) drug therapy: Secondary | ICD-10-CM | POA: Diagnosis not present

## 2023-04-04 MED ORDER — LOSARTAN POTASSIUM 50 MG PO TABS: 50.0000 mg | ORAL_TABLET | Freq: Every day | ORAL | 3 refills | Status: DC

## 2023-04-04 NOTE — Progress Notes (Signed)
Patient Care Team: Early, Sung Amabile, NP as PCP - General (Nurse Practitioner) Sheilah Pigeon, PA-C as Physician Assistant (Cardiology)   HPI  [name] is a 89 y.o. female Seen in follow-up for atrial fibrillation with asymptomatic termination pauses.  She has a history of cryptogenic stroke.  Anticoagulated with apixoban  no bleeding   6/22 started on dofetilide. Interval atrial fib 6 hrs   The patient denies chest pain, shortness of breath, nocturnal dyspnea, orthopnea or peripheral edema.  There have been no palpitations, lightheadedness or syncope.      Complains of fatigue, and leg heaviness.  Wonders whether it is related to amlodipine     Date Cr K Mg . Hgb TSH  3/20 0.75 6.1>>4.8  12.4   3/21 0.75 4.6  12.5   9/22 0.68 4.9 2.2 (6/22) 12.7 3.05 (12/22) 2  9/23 0.67 4.6  12.2       DATE TEST EF   3/22 Echo   60-65 % PA press 39  6/22 Myoview     % No perfusion defects               Records and Results Reviewed0.6  Past Medical History:  Diagnosis Date   Acute CVA (cerebrovascular accident) (HCC) 03/10/2014   Anemia    hx of with surgery    Arthritis 07/27/2011   osteoarthritis-hips/s/p bil. THA, arthritis right hand    Blepharitis of left eye    Blood transfusion 06/2012   post surgery some time ago   Cancer [facility]) 1989   Breast cancer -s/p lt. mastectomy, some squamous cell lesions   Cold hands    Complication of anesthesia    epinephrine - screams uncontrollably / pt does not want general anesthesia or narcotics   Constipation    Dyslipidemia 04/15/2015   Elevated LDL cholesterol level 01/26/2020   Fuchs' corneal dystrophy    both eyes   Hard of hearing    both ears mild loss   Hyperkalemia 03/07/2018   Hypertension    Hypothyroidism 3-21- 13   tx. levothyroxine   Lichen sclerosus et atrophicus of the vulva    pt reported.    Neuromuscular disorder (HCC) 01/2022   Osteopenia    PMR (polymyalgia rheumatica) (HCC) 08/14/2022   Stroke  [facility])    a. s/p MDT LINQ 04/2014    Past Surgical History:  Procedure Laterality Date   ABDOMINAL HYSTERECTOMY     ovaries removed   ANKLE SURGERY  05/09/2003   right ankle tendon repair   basal cell removed from face     3-4 times   BREAST SURGERY  07/07/1987   lt.breast cancer intraductal cancer, mastectomy   CATARACT EXTRACTION, BILATERAL  few yrs ago   Bilateral   EYE SURGERY  9-23   FRACTURE SURGERY     HARDWARE REMOVAL  04/05/2012   Procedure: HARDWARE REMOVAL;  Surgeon: Loanne Drilling, MD;  Location: [facility];  Service: Orthopedics;  Laterality: Right;  Removal of Hardware Right Femur    HARDWARE REMOVAL Left 06/30/2013   Procedure: LEFT HIP HARDWARE REMOVAL OF CABLES;  Surgeon: Loanne Drilling, MD;  Location: [facility];  Service: Orthopedics;  Laterality: Left;   HARDWARE REMOVAL Right 08/21/2013   Procedure: RIGHT HIP HARDWARE REMOVAL;  Surgeon: Loanne Drilling, MD;  Location: [facility];  Service: Orthopedics;  Laterality: Right;   JOINT REPLACEMENT  07/27/2011   Bilateral: Right THA - 04/2000, Left THA 06/2001  left hip replacement Left 06/08/2001   LOOP RECORDER IMPLANT  04/08/2014   MDT LINQ implanted by Dr Ladona Ridgel for cryptogenic stroke   LOOP RECORDER REMOVAL N/A 04/26/2017   Procedure: LOOP RECORDER REMOVAL;  Surgeon: Marinus Maw, MD;  Location[facility] CV [facility];  Service: Cardiovascular;  Laterality: N/A;   MASTECTOMY  05/09/1987   left - Pt states has no restrictions on use of L arm for BP or blood draw   melanoma removed Left    x 1   ORIF FEMUR FRACTURE  07/31/2011   Procedure: OPEN REDUCTION INTERNAL FIXATION (ORIF) DISTAL FEMUR FRACTURE;  Surgeon: Loanne Drilling, MD;  Location: [facility];  Service: Orthopedics;  Laterality: Right;  right femur  (c-arm)    SQUAMOUS CELL CARCINOMA EXCISION  07/27/2011   x2 left shoulder   TOTAL HIP REVISION  04/10/2012   Procedure: TOTAL HIP REVISION;  Surgeon: Loanne Drilling, MD;  Location: [facility];  Service: Orthopedics;   Laterality: Right;   TOTAL HIP REVISION Left 06/27/2012   Procedure: LEFT TOTAL HIP REVISION;  Surgeon: Loanne Drilling, MD;  Location: [facility];  Service: Orthopedics;  Laterality: Left;    Current Meds  Medication Sig   amLODipine (NORVASC) 5 MG tablet TAKE 1 AND 1/2 TABLETS(7.5 MG) BY MOUTH DAILY   Coenzyme Q10 (COQ-10 PO) Take 1 capsule by mouth daily with breakfast.   diclofenac Sodium (VOLTAREN) 1 % GEL Apply 2 g topically 4 (four) times daily.   dofetilide (TIKOSYN) 250 MCG capsule Take 1 capsule (250 mcg total) by mouth 2 (two) times daily. Please keep scheduled appointment for future refills. Thank you.   ELIQUIS 2.5 MG TABS tablet TAKE 1 TABLET(2.5 MG) BY MOUTH TWICE DAILY   levothyroxine (SYNTHROID) 100 MCG tablet TAKE 1 TABLET(100 MCG) BY MOUTH DAILY   Magnesium Oxide 200 MG TABS Take 1 tablet (200 mg total) by mouth daily.   Multiple Vitamins-Iron (MULTIVITAMINS WITH IRON) TABS tablet Take 1 tablet by mouth daily with breakfast.   NON FORMULARY Place 1 drop into both eyes every morning. Sodium Chloride eye drops   Pumpkin Seed-Soy Germ (AZO BLADDER CONTROL/GO-LESS) CAPS Take by mouth. 1 daily   triamcinolone cream (KENALOG) 0.1 % APPLY TOPICALLY TO THE AFFECTED AREA TWICE DAILY   Vitamin D, Cholecalciferol, 1000 UNITS TABS Take 1,000 Units by mouth daily with breakfast.    Allergies  Allergen Reactions   Lactose Intolerance (Gi) Other (See Comments)    Bloating, gas   Epinephrine Other (See Comments)     Reaction:  Caused pt to scream uncontrollably.      Review of Systems negative except from HPI and PMH  Physical Exam BP (!) 154/80   Pulse (!) 57   Ht 5\' 4"  (1.626 m)   Wt 122 lb 3.2 oz (55.4 kg)   BMI 20.98 kg/m  Well developed and nourished in no acute distress HENT normal Neck supple with JVP-  flat  Clear Regular rate and rhythm, no murmurs or gallops Abd-soft with active BS No Clubbing cyanosis edema Skin-warm and dry A & Oriented  Grossly normal  sensory and motor function  ECG sinus @ 57 17/08/43   CrCl cannot be calculated (Patient's most recent lab result is older than the maximum 21 days allowed.).   Assessment and  Plan Atrial fibrillation-persistent now on dofetilide  High risk medication surveillance.  Hypertension  Polymyalgia rheumatica  Recurrent atrial fibrillation x 6hr--continue dofetilide  BP poorly controlled--will stop amlodipine concern re leg  fatigue, will begin losartan 50 mg.  Will start at 25 mg x 4 days as the amlodipine begins to washout.  She will be seeing her PCP 12/12.  At that point further antihypertensive therapies can be initiated if necessary.  Continue apixaban.  Will need metabolic profile and CBC.  If they are not been drawn by her PCP we will draw and then in 2 weeks    Current medicines are reviewed at length with the patient today .  The patient does not  have concerns regarding medicines.

## 2023-04-04 NOTE — Patient Instructions (Addendum)
Medication Instructions:  Your physician has recommended you make the following change in your medication:   ** Stop Amlodipine  ** Start Losartan 50mg  - 1 tablet by mouth daily but begin 1/2 (25mg ) tablet by mouth x 4 days  *If you need a refill on your cardiac medications before your next appointment, please call your pharmacy*   Lab Work:  CBC and BMET in 2 weeks  If you have labs (blood work) drawn today and your tests are completely normal, you will receive your results only by: MyChart Message (if you have MyChart) OR A paper copy in the mail If you have any lab test that is abnormal or we need to change your treatment, we will call you to review the results.   Testing/Procedures: None ordered.    Follow-Up: At [facility], you and your health needs are our priority.  As part of our continuing mission to provide you with exceptional heart care, we have created designated Provider Care Teams.  These Care Teams include your primary Cardiologist (physician) and Advanced Practice Providers (APPs -  Physician Assistants and Nurse Practitioners) who all work together to provide you with the care you need, when you need it.  We recommend signing up for the patient portal called "MyChart".  Sign up information is provided on this After Visit Summary.  MyChart is used to connect with patients for Virtual Visits (Telemedicine).  Patients are able to view lab/test results, encounter notes, upcoming appointments, etc.  Non-urgent messages can be sent to your provider as well.   To learn more about what you can do with MyChart, go to [email or web address].    Your next appointment:    6 months with Dr Odessa Fleming PA  12 months with Dr Graciela Husbands

## 2023-04-17 ENCOUNTER — Telehealth: Payer: Self-pay

## 2023-04-17 ENCOUNTER — Ambulatory Visit: Payer: Medicare Other

## 2023-04-17 DIAGNOSIS — Z0000 Encounter for general adult medical examination without abnormal findings: Secondary | ICD-10-CM

## 2023-04-17 NOTE — Progress Notes (Signed)
Subjective:   [name] is a 89 y.o. female who presents for Medicare Annual (Subsequent) preventive examination.  Visit Complete: Virtual I connected with  Ephriam Jenkins on 04/17/23 by a audio enabled telemedicine application and verified that I am speaking with the correct person using two identifiers.  Patient Location: Home  Provider Location: Office/Clinic  I discussed the limitations of evaluation and management by telemedicine. The patient expressed understanding and agreed to proceed.  Vital Signs: Because this visit was a virtual/telehealth visit, some criteria may be missing or patient reported. Any vitals not documented were not able to be obtained and vitals that have been documented are patient reported.  Patient Medicare AWV questionnaire was completed by the patient on 04/15/2023; I have confirmed that all information answered by patient is correct and no changes since this date.  Cardiac Risk Factors include: advanced age (>39men, >39 women);dyslipidemia;hypertension     Objective:    Today's Vitals   04/17/23 0841  PainSc: 2    There is no height or weight on file to calculate BMI.     04/17/2023    8:51 AM 04/11/2022    8:21 AM 10/18/2020    1:22 PM 12/05/2019    2:33 PM 11/28/2019    1:25 PM 09/09/2015   10:01 PM 03/14/2014    8:22 PM  Advanced Directives  Does Patient Have a Medical Advance Directive? Yes Yes Yes No No Yes No;Yes  Type of Estate agent of [place];Living will Healthcare Power of [place];Living will Healthcare Power of [place];Living will   Healthcare Power of [place];Living will Healthcare Power of Attorney  Does patient want to make changes to medical advance directive?  Yes (ED - Information included in AVS) No - Patient declined   No - Patient declined   Copy of Healthcare Power of Attorney in Chart? Yes - validated most recent copy scanned in chart (See row information) Yes - validated most recent copy scanned  in chart (See row information) No - copy requested   No - copy requested   Would patient like information on creating a medical advance directive?    No - Patient declined No - Patient declined      Current Medications (verified) Outpatient Encounter Medications as of 04/17/2023  Medication Sig   calcipotriene (DOVONOX) 0.005 % cream Apply topically 2 (two) times daily.   Coenzyme Q10 (COQ-10 PO) Take 1 capsule by mouth daily with breakfast.   diclofenac Sodium (VOLTAREN) 1 % GEL Apply 2 g topically 4 (four) times daily.   dofetilide (TIKOSYN) 250 MCG capsule Take 1 capsule (250 mcg total) by mouth 2 (two) times daily. Please keep scheduled appointment for future refills. Thank you.   ELIQUIS 2.5 MG TABS tablet TAKE 1 TABLET(2.5 MG) BY MOUTH TWICE DAILY   levothyroxine (SYNTHROID) 100 MCG tablet TAKE 1 TABLET(100 MCG) BY MOUTH DAILY   losartan (COZAAR) 50 MG tablet Take 1 tablet (50 mg total) by mouth daily.   Magnesium Oxide 200 MG TABS Take 1 tablet (200 mg total) by mouth daily.   Multiple Vitamins-Iron (MULTIVITAMINS WITH IRON) TABS tablet Take 1 tablet by mouth daily with breakfast.   NON FORMULARY Place 1 drop into both eyes every morning. Sodium Chloride eye drops   Pumpkin Seed-Soy Germ (AZO BLADDER CONTROL/GO-LESS) CAPS Take by mouth. 1 daily   triamcinolone cream (KENALOG) 0.1 % APPLY TOPICALLY TO THE AFFECTED AREA TWICE DAILY   Vitamin D, Cholecalciferol, 1000 UNITS TABS Take 1,000 Units by  mouth daily with breakfast.   No facility-administered encounter medications on file as of 04/17/2023.    Allergies (verified) Lactose intolerance (gi) and Epinephrine   History: Past Medical History:  Diagnosis Date   Acute CVA (cerebrovascular accident) (HCC) 03/10/2014   Anemia    hx of with surgery    Arthritis 07/27/2011   osteoarthritis-hips/s/p bil. THA, arthritis right hand    Blepharitis of left eye    Blood transfusion 06/2012   post surgery some time ago   Cancer [facility])  1989   Breast cancer -s/p lt. mastectomy, some squamous cell lesions   Cold hands    Complication of anesthesia    epinephrine - screams uncontrollably / pt does not want general anesthesia or narcotics   Constipation    Dyslipidemia 04/15/2015   Elevated LDL cholesterol level 01/26/2020   Fuchs' corneal dystrophy    both eyes   Hard of hearing    both ears mild loss   Hyperkalemia 03/07/2018   Hypertension    Hypothyroidism 3-21- 13   tx. levothyroxine   Lichen sclerosus et atrophicus of the vulva    pt reported.    Neuromuscular disorder (HCC) 01/2022   Osteopenia    PMR (polymyalgia rheumatica) (HCC) 08/14/2022   Stroke [facility])    a. s/p MDT LINQ 04/2014   Past Surgical History:  Procedure Laterality Date   ABDOMINAL HYSTERECTOMY     ovaries removed   ANKLE SURGERY  05/09/2003   right ankle tendon repair   basal cell removed from face     3-4 times   BREAST SURGERY  07/07/1987   lt.breast cancer intraductal cancer, mastectomy   CATARACT EXTRACTION, BILATERAL  few yrs ago   Bilateral   EYE SURGERY  9-23   FRACTURE SURGERY     HARDWARE REMOVAL  04/05/2012   Procedure: HARDWARE REMOVAL;  Surgeon: Loanne Drilling, MD;  Location: [facility];  Service: Orthopedics;  Laterality: Right;  Removal of Hardware Right Femur    HARDWARE REMOVAL Left 06/30/2013   Procedure: LEFT HIP HARDWARE REMOVAL OF CABLES;  Surgeon: Loanne Drilling, MD;  Location: [facility];  Service: Orthopedics;  Laterality: Left;   HARDWARE REMOVAL Right 08/21/2013   Procedure: RIGHT HIP HARDWARE REMOVAL;  Surgeon: Loanne Drilling, MD;  Location: [facility];  Service: Orthopedics;  Laterality: Right;   JOINT REPLACEMENT  07/27/2011   Bilateral: Right THA - 04/2000, Left THA 06/2001   left hip replacement Left 06/08/2001   LOOP RECORDER IMPLANT  04/08/2014   MDT LINQ implanted by Dr Ladona Ridgel for cryptogenic stroke   LOOP RECORDER REMOVAL N/A 04/26/2017   Procedure: LOOP RECORDER REMOVAL;  Surgeon: Marinus Maw, MD;   Location: [facility] INVASIVE CV LAB;  Service: Cardiovascular;  Laterality: N/A;   MASTECTOMY  05/09/1987   left - Pt states has no restrictions on use of L arm for BP or blood draw   melanoma removed Left    x 1   ORIF FEMUR FRACTURE  07/31/2011   Procedure: OPEN REDUCTION INTERNAL FIXATION (ORIF) DISTAL FEMUR FRACTURE;  Surgeon: Loanne Drilling, MD;  Location: [facility];  Service: Orthopedics;  Laterality: Right;  right femur  (c-arm)    SQUAMOUS CELL CARCINOMA EXCISION  07/27/2011   x2 left shoulder   TOTAL HIP REVISION  04/10/2012   Procedure: TOTAL HIP REVISION;  Surgeon: Loanne Drilling, MD;  Location: [facility];  Service: Orthopedics;  Laterality: Right;   TOTAL HIP REVISION Left 06/27/2012   Procedure:  LEFT TOTAL HIP REVISION;  Surgeon: Loanne Drilling, MD;  Location: [facility];  Service: Orthopedics;  Laterality: Left;   Family History  Problem Relation Age of Onset   Hodgkin's lymphoma Mother    Celiac disease Mother    Cancer Mother    Miscarriages / [place] Mother    Melanoma Father    Cancer Father    Cancer Brother    Arthritis Sister    Ulcerative colitis Sister    Depression Sister    Hearing loss Sister    Kidney disease Sister    Miscarriages / Stillbirths Sister    Heart disease Paternal Grandmother    Heart disease Paternal Grandfather    Vision loss Paternal Grandfather    Hearing loss Maternal Grandmother    Social History   Socioeconomic History   Marital status: Single    Spouse name: Not on file   Number of children: 1   Years of education: Not on file   Highest education level: Not on file  Occupational History   Occupation: Retired  Tobacco Use   Smoking status: Former    Current packs/day: 0.00    Average packs/day: 1 pack/day for 10.0 years (10.0 ttl pk-yrs)    Types: Cigarettes    Start date: 05/08/1953    Quit date: 05/09/1963    Years since quitting: 59.9    Passive exposure: Never   Smokeless tobacco: Never  Vaping Use   Vaping status: Never  Used  Substance and Sexual Activity   Alcohol use: Not Currently    Alcohol/week: 1.0 standard drink of alcohol    Types: 1 Glasses of wine per week    Comment: very little   Drug use: No   Sexual activity: Not Currently  Other Topics Concern   Not on file  Social History Narrative   Lives alone.   Social Determinants of Health   Financial Resource Strain: Low Risk  (04/15/2023)   Overall Financial Resource Strain (CARDIA)    Difficulty of Paying Living Expenses: Not hard at all  Food Insecurity: No Food Insecurity (04/15/2023)   Hunger Vital Sign    Worried About Running Out of Food in the Last Year: Never true    Ran Out of Food in the Last Year: Never true  Transportation Needs: No Transportation Needs (04/15/2023)   PRAPARE - Administrator, Civil Service (Medical): No    Lack of Transportation (Non-Medical): No  Physical Activity: Sufficiently Active (04/15/2023)   Exercise Vital Sign    Days of Exercise per Week: 7 days    Minutes of Exercise per Session: 90 min  Stress: No Stress Concern Present (04/15/2023)   [employer] of Occupational Health - Occupational Stress Questionnaire    Feeling of Stress : Not at all  Social Connections: Unknown (04/15/2023)   Social Connection and Isolation Panel [NHANES]    Frequency of Communication with Friends and Family: More than three times a week    Frequency of Social Gatherings with Friends and Family: More than three times a week    Attends Religious Services: Not on Marketing executive or Organizations: Yes    Attends Engineer, structural: More than 4 times per year    Marital Status: Never married    Tobacco Counseling Counseling given: Not Answered   Clinical Intake:  Pre-visit preparation completed: Yes  Pain : 0-10 Pain Score: 2  Pain Type: Chronic pain Pain Location: Generalized Pain Descriptors /  Indicators: Aching Pain Onset: More than a month ago Pain Frequency:  Constant     Nutritional Risks: None Diabetes: No  How often do you need to have someone help you when you read instructions, pamphlets, or other written materials from your doctor or pharmacy?: 1 - Never  Interpreter Needed?: No  Information entered by :: NAllen LPN   Activities of Daily Living    04/15/2023    5:59 PM  In your present state of health, do you have any difficulty performing the following activities:  Hearing? 0  Vision? 1  Comment uses magnifying glass, Folks Distrophy  Difficulty concentrating or making decisions? 0  Walking or climbing stairs? 1  Comment due legs  Dressing or bathing? 0  Doing errands, shopping? 0  Preparing Food and eating ? N  Using the Toilet? N  In the past six months, have you accidently leaked urine? Y  Comment takes azo  Do you have problems with loss of bowel control? N  Managing your Medications? N  Managing your Finances? N  Housekeeping or managing your Housekeeping? N    Patient Care Team: Early, Sung Amabile, NP as PCP - General (Nurse Practitioner) Sheilah Pigeon, PA-C as Physician Assistant (Cardiology)  Indicate any recent Medical Services you may have received from other than [facility] providers in the past year (date may be approximate).     Assessment:   This is a routine wellness examination for [facility].  Hearing/Vision screen Hearing Screening - Comments:: Denies hearing issues Vision Screening - Comments:: Regular eye exams, [facility]   Goals Addressed             This Visit's Progress    Patient Stated       04/17/2023, keep going       Depression Screen    04/17/2023    8:52 AM 04/11/2022    8:18 AM 08/04/2021   10:04 AM 01/27/2021   10:12 AM 10/07/2020    8:05 AM 08/26/2020    3:18 PM 01/26/2020    8:29 AM  PHQ 2/9 Scores  PHQ - 2 Score 0 0 0 0 0 0 0  PHQ- 9 Score 1          Fall Risk    04/15/2023    5:59 PM 09/26/2022   12:32 PM 04/11/2022    8:18 AM 02/02/2022   11:31 AM 08/04/2021    10:03 AM  Fall Risk   Falls in the past year? 0 0 0 0 1  Number falls in past yr: 0 0 0 0 0  Injury with Fall? 0 0 0 0 0  Risk for fall due to : Medication side effect No Fall Risks No Fall Risks No Fall Risks Impaired balance/gait  Follow up Falls prevention discussed;Falls evaluation completed Falls evaluation completed Falls evaluation completed Falls evaluation completed Education provided;Falls evaluation completed    MEDICARE RISK AT HOME: Medicare Risk at Home Any stairs in or around the home?: Yes If so, are there any without handrails?: No Home free of loose throw rugs in walkways, pet beds, electrical cords, etc?: Yes Adequate lighting in your home to reduce risk of falls?: Yes Life alert?: No Use of a cane, walker or w/c?: No Grab bars in the bathroom?: Yes Shower chair or bench in shower?: No Elevated toilet seat or a handicapped toilet?: No  TIMED UP AND GO:  Was the test performed?  No    Cognitive Function:    04/11/2022  8:42 AM 09/09/2015   10:01 PM  MMSE - Mini Mental State Exam  Not completed:  --  Orientation to time 5   Orientation to Place 5   Registration 3   Attention/ Calculation 5   Recall 3   Language- name 2 objects 2   Language- repeat 1   Language- follow 3 step command 3   Language- read & follow direction 1   Write a sentence 1   Copy design 1   Total score 30         04/17/2023    8:52 AM  6CIT Screen  What Year? 0 points  What month? 0 points  What time? 0 points  Count back from 20 0 points  Months in reverse 0 points  Repeat phrase 0 points  Total Score 0 points    Immunizations Immunization History  Administered Date(s) Administered   Fluad Quad(high Dose 65+) 01/26/2020, 02/02/2022   Gamma Globulin 10/09/1983, 12/18/1984, 07/24/1989   Hepatitis A 10/19/1995   Hepatitis B 06/08/2009   IPV 12/07/1969, 12/19/1983, 11/25/1984, 01/08/1986   Influenza, High Dose Seasonal PF 02/07/2017, 01/28/2018, 12/27/2018    Influenza, Seasonal, Injecte, Preservative Fre 01/19/2014   Influenza-Unspecified 01/19/2014, 01/19/2015, 12/30/2018, 01/05/2021, 01/09/2023   OPV 10/19/1995   PFIZER(Purple Top)SARS-COV-2 Vaccination 06/14/2019, 07/09/2019, 08/11/2020, 01/14/2021   Pfizer(Comirnaty)Fall Seasonal Vaccine 12 years and older 03/02/2022, 01/24/2023   Pneumococcal Conjugate-13 02/09/2015   Pneumococcal Polysaccharide-23 07/06/2001, 01/14/2019   Rabies, IM 11/08/2021, 11/11/2021, 11/17/2021   Td 11/13/1978, 01/04/1989, 10/19/1995, 06/08/2009   Tdap 08/26/2010, 02/17/2022   Typhoid Inactivated 10/09/1983, 11/01/1989, 11/11/2002   Typhoid Live 12/09/1995, 06/08/2009   Yellow Fever 08/26/2010   Zoster Recombinant(Shingrix) 09/15/2016, 12/20/2016   Zoster, Live 09/12/2006    TDAP status: Up to date  Flu Vaccine status: Up to date  Pneumococcal vaccine status: Up to date  Covid-19 vaccine status: Completed vaccines  Qualifies for Shingles Vaccine? Yes   Zostavax completed Yes   Shingrix Completed?: Yes  Screening Tests Health Maintenance  Topic Date Due   DEXA SCAN  02/05/2023   COVID-19 Vaccine (7 - 2023-24 season) 03/21/2023   Medicare Annual Wellness (AWV)  04/16/2024   DTaP/Tdap/Td (7 - Td or Tdap) 02/18/2032   Pneumonia Vaccine 72+ Years old  Completed   INFLUENZA VACCINE  Completed   Zoster Vaccines- Shingrix  Completed   HPV VACCINES  Aged Out    Health Maintenance  Health Maintenance Due  Topic Date Due   DEXA SCAN  02/05/2023   COVID-19 Vaccine (7 - 2023-24 season) 03/21/2023    Colorectal cancer screening: No longer required.   Mammogram status: No longer required due to age.  Bone Density status: Completed 02/04/2021.   Lung Cancer Screening: (Low Dose CT Chest recommended if Age 48-80 years, 20 pack-year currently smoking OR have quit w/in 15years.) does not qualify.   Lung Cancer Screening Referral: no  Additional Screening:  Hepatitis C Screening: does not qualify;    Vision Screening: Recommended annual ophthalmology exams for early detection of glaucoma and other disorders of the eye. Is the patient up to date with their annual eye exam?  Yes  Who is the provider or what is the name of the office in which the patient attends annual eye exams? [facility] If pt is not established with a provider, would they like to be referred to a provider to establish care? No .   Dental Screening: Recommended annual dental exams for proper oral hygiene  Diabetic Foot Exam: n/a  Community Resource Referral / Chronic Care Management: CRR required this visit?  No   CCM required this visit?  No     Plan:     I have personally reviewed and noted the following in the patient's chart:   Medical and social history Use of alcohol, tobacco or illicit drugs  Current medications and supplements including opioid prescriptions. Patient is not currently taking opioid prescriptions. Functional ability and status Nutritional status Physical activity Advanced directives List of other physicians Hospitalizations, surgeries, and ER visits in previous 12 months Vitals Screenings to include cognitive, depression, and falls Referrals and appointments  In addition, I have reviewed and discussed with patient certain preventive protocols, quality metrics, and best practice recommendations. A written personalized care plan for preventive services as well as general preventive health recommendations were provided to patient.     Barb Merino, LPN   [id number]   After Visit Summary: (MyChart) Due to this being a telephonic visit, the after visit summary with patients personalized plan was offered to patient via MyChart   Nurse Notes: none

## 2023-04-17 NOTE — Telephone Encounter (Signed)
Called to schedule to pick up orthotics

## 2023-04-17 NOTE — Patient Instructions (Signed)
[name] , Thank you for taking time to come for your Medicare Wellness Visit. I appreciate your ongoing commitment to your health goals. Please review the following plan we discussed and let me know if I can assist you in the future.   Referrals/Orders/Follow-Ups/Clinician Recommendations: none  This is a list of the screening recommended for you and due dates:  Health Maintenance  Topic Date Due   DEXA scan (bone density measurement)  02/05/2023   COVID-19 Vaccine (7 - 2023-24 season) 03/21/2023   Medicare Annual Wellness Visit  04/16/2024   DTaP/Tdap/Td vaccine (7 - Td or Tdap) 02/18/2032   Pneumonia Vaccine  Completed   Flu Shot  Completed   Zoster (Shingles) Vaccine  Completed   HPV Vaccine  Aged Out    Advanced directives: (In Chart) A copy of your advanced directives are scanned into your chart should your provider ever need it.  Next Medicare Annual Wellness Visit scheduled for next year: Yes  Insert Preventive Care attachment Insert FALL PREVENTION attachment if needed

## 2023-04-19 ENCOUNTER — Ambulatory Visit (INDEPENDENT_AMBULATORY_CARE_PROVIDER_SITE_OTHER): Payer: Medicare Other | Admitting: Nurse Practitioner

## 2023-04-19 ENCOUNTER — Encounter: Payer: Self-pay | Admitting: Nurse Practitioner

## 2023-04-19 VITALS — BP 134/80 | HR 52 | Wt 122.4 lb

## 2023-04-19 DIAGNOSIS — I10 Essential (primary) hypertension: Secondary | ICD-10-CM | POA: Diagnosis not present

## 2023-04-19 DIAGNOSIS — Z0000 Encounter for general adult medical examination without abnormal findings: Secondary | ICD-10-CM

## 2023-04-19 DIAGNOSIS — D6869 Other thrombophilia: Secondary | ICD-10-CM

## 2023-04-19 DIAGNOSIS — K449 Diaphragmatic hernia without obstruction or gangrene: Secondary | ICD-10-CM

## 2023-04-19 DIAGNOSIS — E782 Mixed hyperlipidemia: Secondary | ICD-10-CM

## 2023-04-19 DIAGNOSIS — E039 Hypothyroidism, unspecified: Secondary | ICD-10-CM

## 2023-04-19 DIAGNOSIS — E559 Vitamin D deficiency, unspecified: Secondary | ICD-10-CM | POA: Diagnosis not present

## 2023-04-19 DIAGNOSIS — I480 Paroxysmal atrial fibrillation: Secondary | ICD-10-CM

## 2023-04-19 DIAGNOSIS — I700 Atherosclerosis of aorta: Secondary | ICD-10-CM

## 2023-04-19 DIAGNOSIS — I4819 Other persistent atrial fibrillation: Secondary | ICD-10-CM | POA: Diagnosis not present

## 2023-04-19 DIAGNOSIS — E875 Hyperkalemia: Secondary | ICD-10-CM

## 2023-04-19 DIAGNOSIS — E871 Hypo-osmolality and hyponatremia: Secondary | ICD-10-CM

## 2023-04-19 NOTE — Patient Instructions (Signed)
Try using an eye drop with mineral oil (systane or soothe xp) to see if this helps provide relief for the sensation of something in the eye.

## 2023-04-19 NOTE — Progress Notes (Signed)
Shawna Clamp, DNP, AGNP-c [facility] [address] [place], [place] [address] Main Office [phone]  BP 134/80   Pulse (!) 52   Wt 122 lb 6.4 oz (55.5 kg)   BMI 21.01 kg/m    Subjective:    Patient ID: [name], female    DOB: 07-06-1931, 89 y.o.   MRN: [id number]  HPI: [name] is a 89 y.o. female presenting on 04/19/2023 for comprehensive medical examination.   The patient, with a history of atrial fibrillation (AFib), psoriasis, benign essential tremor, and hiatal hernia, presents for a routine wellness visit. The patient reports a recent episode of AFib, which lasted several hours but has since resolved. She also mentions a new rash on her legs, which she believes is a recurrence of her psoriasis. The rash developed after a car door slammed into her leg, causing internal bruising. Despite treatment with vitamin D cream, the rash has not improved, but it does not cause any discomfort.  The patient also reports a tremor in her left hand, which she describes as a nuisance that interferes with her daily activities, such as knitting, weaving, and cooking. She also notes occasional numbness and cramping in her right hand, particularly in the mornings. The patient has a history of multiple surgeries on her legs, including hip replacements and revisions, and a repair for a cracked femur. Despite these surgeries, she maintains an active lifestyle, walking up to 11 miles per week.  The patient has stopped taking turmeric and fish oil supplements, noting a dislike for the taste of the turmeric pill and questioning the necessity of the fish oil. She continues to take pumpkin seed supplements for bladder health. She is also on Tikosyn and Eliquis for AFib management, and magnesium as prescribed by her cardiologist. She reports a hiatal hernia that is becoming more noticeable, particularly when consuming thick soups, but it is not bothersome enough to consider surgery  at this time.  The patient also mentions neuropathy in her feet, which she understands may worsen over time. 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 She reports a sensation of something being stuck in her eye, which causes discomfort. The patient also notes some memory issues, which she manages by keeping her brain active with activities such as playing the violin.  Pertinent items are noted in HPI.  IMMUNIZATIONS:   Flu Vaccine: Flu vaccine completed elsewhere this season. Record updated. Prevnar 13: Prevnar 13 completed, documentation in chart Prevnar 20: Prevnar 20 N/A for this patient Pneumovax 23: Pneumovax completed, documentation in chart Vac Shingrix: Shingrix both doses completed, documentation in chart HPV: N/A or Aged Out Tetanus: Tetanus completed in the last 10 years COVID: Declined today. Information on where to obtain the vaccine was provided.  RSV: No  HEALTH MAINTENANCE: Pap Smear HM Status: N/A Mammogram HM Status: N/A Colon Cancer Screening HM Status: N/A Bone Density HM Status: is reported as completed, but records are needed- completed in August STI Testing HM Status: N/A Lung CT HM Status: N/A  Concerns with vision, hearing, or dentition: No  Most Recent Depression Screen:     04/19/2023   11:12 AM 04/17/2023    8:52 AM 04/11/2022    8:18 AM 08/04/2021   10:04 AM 01/27/2021   10:12 AM  Depression screen PHQ 2/9  Decreased Interest 0 0 0 0 0  Down, Depressed, Hopeless 0 0 0 0 0  PHQ -  2 Score 0 0 0 0 0  Altered sleeping  0     Tired, decreased energy  1     Change in appetite  0     Feeling bad or failure about yourself   0     Trouble concentrating  0     Moving slowly or fidgety/restless  0     Suicidal thoughts  0     PHQ-9 Score  1     Difficult doing work/chores  Not difficult at all      Most Recent Anxiety Screen:      No data to display         Most Recent Fall  Screen:    04/19/2023   11:11 AM 04/15/2023    5:59 PM 09/26/2022   12:32 PM 04/11/2022    8:18 AM 02/02/2022   11:31 AM  Fall Risk   Falls in the past year? 0 0 0 0 0  Number falls in past yr: 0 0 0 0 0  Injury with Fall? 0 0 0 0 0  Risk for fall due to : No Fall Risks Medication side effect No Fall Risks No Fall Risks No Fall Risks  Follow up Falls evaluation completed Falls prevention discussed;Falls evaluation completed Falls evaluation completed Falls evaluation completed Falls evaluation completed    Past medical history, surgical history, medications, allergies, family history and social history reviewed with patient today and changes made to appropriate areas of the chart.  Past Medical History:  Past Medical History:  Diagnosis Date   Acute CVA (cerebrovascular accident) (HCC) 03/10/2014   Anemia    hx of with surgery    Arthritis 07/27/2011   osteoarthritis-hips/s/p bil. THA, arthritis right hand    Blepharitis of left eye    Blood transfusion 06/2012   post surgery some time ago   Cancer [facility]) 1989   Breast cancer -s/p lt. mastectomy, some squamous cell lesions   Cold hands    Complication of anesthesia    epinephrine - screams uncontrollably / pt does not want general anesthesia or narcotics   Constipation    Dyslipidemia 04/15/2015   Elevated LDL cholesterol level 01/26/2020   Fuchs' corneal dystrophy    both eyes   Hard of hearing    both ears mild loss   Hyperkalemia 03/07/2018   Hypertension    Hypothyroidism 3-21- 13   tx. levothyroxine   Lichen sclerosus et atrophicus of the vulva    pt reported.    Neuromuscular disorder (HCC) 01/2022   Osteopenia    PMR (polymyalgia rheumatica) (HCC) 08/14/2022   Stroke [facility])    a. s/p MDT LINQ 04/2014   Medications:  Current Outpatient Medications on File Prior to Visit  Medication Sig   calcipotriene (DOVONOX) 0.005 % cream Apply topically 2 (two) times daily.   clobetasol ointment (TEMOVATE) 0.05 % Apply  topically 2 (two) times daily.   Coenzyme Q10 (COQ-10 PO) Take 1 capsule by mouth daily with breakfast.   dofetilide (TIKOSYN) 250 MCG capsule Take 1 capsule (250 mcg total) by mouth 2 (two) times daily. Please keep scheduled appointment for future refills. Thank you.   ELIQUIS 2.5 MG TABS tablet TAKE 1 TABLET(2.5 MG) BY MOUTH TWICE DAILY   levothyroxine (SYNTHROID) 100 MCG tablet TAKE 1 TABLET(100 MCG) BY MOUTH DAILY   losartan (COZAAR) 50 MG tablet Take 1 tablet (50 mg total) by mouth daily.   Magnesium Oxide 200 MG TABS Take 1 tablet (200 mg total) by  mouth daily.   Multiple Vitamins-Iron (MULTIVITAMINS WITH IRON) TABS tablet Take 1 tablet by mouth daily with breakfast.   NON FORMULARY Place 1 drop into both eyes every morning. Sodium Chloride eye drops   Pumpkin Seed-Soy Germ (AZO BLADDER CONTROL/GO-LESS) CAPS Take by mouth. 1 daily   triamcinolone cream (KENALOG) 0.1 % APPLY TOPICALLY TO THE AFFECTED AREA TWICE DAILY   Vitamin D, Cholecalciferol, 1000 UNITS TABS Take 1,000 Units by mouth daily with breakfast.   acetaminophen (TYLENOL) 325 MG tablet 2 tablets Orally Once a day   amLODipine (NORVASC) 5 MG tablet Take by mouth.   diclofenac Sodium (VOLTAREN) 1 % GEL Apply 2 g topically 4 (four) times daily. (Patient not taking: Reported on 04/19/2023)   No current facility-administered medications on file prior to visit.   Surgical History:  Past Surgical History:  Procedure Laterality Date   ABDOMINAL HYSTERECTOMY     ovaries removed   ANKLE SURGERY  05/09/2003   right ankle tendon repair   basal cell removed from face     3-4 times   BREAST SURGERY  07/07/1987   lt.breast cancer intraductal cancer, mastectomy   CATARACT EXTRACTION, BILATERAL  few yrs ago   Bilateral   EYE SURGERY  9-23   FRACTURE SURGERY     HARDWARE REMOVAL  04/05/2012   Procedure: HARDWARE REMOVAL;  Surgeon: Loanne Drilling, MD;  Location: [facility];  Service: Orthopedics;  Laterality: Right;  Removal of Hardware  Right Femur    HARDWARE REMOVAL Left 06/30/2013   Procedure: LEFT HIP HARDWARE REMOVAL OF CABLES;  Surgeon: Loanne Drilling, MD;  Location: [facility];  Service: Orthopedics;  Laterality: Left;   HARDWARE REMOVAL Right 08/21/2013   Procedure: RIGHT HIP HARDWARE REMOVAL;  Surgeon: Loanne Drilling, MD;  Location: [facility];  Service: Orthopedics;  Laterality: Right;   JOINT REPLACEMENT  07/27/2011   Bilateral: Right THA - 04/2000, Left THA 06/2001   left hip replacement Left 06/08/2001   LOOP RECORDER IMPLANT  04/08/2014   MDT LINQ implanted by Dr Ladona Ridgel for cryptogenic stroke   LOOP RECORDER REMOVAL N/A 04/26/2017   Procedure: LOOP RECORDER REMOVAL;  Surgeon: Marinus Maw, MD;  Location: [facility] CV [facility];  Service: Cardiovascular;  Laterality: N/A;   MASTECTOMY  05/09/1987   left - Pt states has no restrictions on use of L arm for BP or blood draw   melanoma removed Left    x 1   ORIF FEMUR FRACTURE  07/31/2011   Procedure: OPEN REDUCTION INTERNAL FIXATION (ORIF) DISTAL FEMUR FRACTURE;  Surgeon: Loanne Drilling, MD;  Location: [facility];  Service: Orthopedics;  Laterality: Right;  right femur  (c-arm)    SQUAMOUS CELL CARCINOMA EXCISION  07/27/2011   x2 left shoulder   TOTAL HIP REVISION  04/10/2012   Procedure: TOTAL HIP REVISION;  Surgeon: Loanne Drilling, MD;  Location: [facility];  Service: Orthopedics;  Laterality: Right;   TOTAL HIP REVISION Left 06/27/2012   Procedure: LEFT TOTAL HIP REVISION;  Surgeon: Loanne Drilling, MD;  Location: [facility];  Service: Orthopedics;  Laterality: Left;   Allergies:  Allergies  Allergen Reactions   Lactose Intolerance (Gi) Other (See Comments)    Bloating, gas   Epinephrine Other (See Comments)     Reaction:  Caused pt to scream uncontrollably.   Family History:  Family History  Problem Relation Age of Onset   Hodgkin's lymphoma Mother    Celiac disease Mother    Cancer Mother  Miscarriages / Stillbirths Mother    Melanoma Father    Cancer Father     Cancer Brother    Arthritis Sister    Ulcerative colitis Sister    Depression Sister    Hearing loss Sister    Kidney disease Sister    Miscarriages / Stillbirths Sister    Heart disease Paternal Grandmother    Heart disease Paternal Grandfather    Vision loss Paternal Grandfather    Hearing loss Maternal Grandmother        Objective:    BP 134/80   Pulse (!) 52   Wt 122 lb 6.4 oz (55.5 kg)   BMI 21.01 kg/m   Wt Readings from Last 3 Encounters:  04/19/23 122 lb 6.4 oz (55.5 kg)  04/04/23 122 lb 3.2 oz (55.4 kg)  09/26/22 119 lb 3.2 oz (54.1 kg)    Physical Exam Vitals and nursing note reviewed.  Constitutional:      General: She is not in acute distress.    Appearance: Normal appearance.  HENT:     Head: Normocephalic and atraumatic.     Right Ear: Hearing, tympanic membrane, ear canal and external ear normal.     Left Ear: Hearing, tympanic membrane, ear canal and external ear normal.     Nose: Nose normal.     Right Sinus: No maxillary sinus tenderness or frontal sinus tenderness.     Left Sinus: No maxillary sinus tenderness or frontal sinus tenderness.     Mouth/Throat:     Lips: Pink.     Mouth: Mucous membranes are moist.     Pharynx: Oropharynx is clear.  Eyes:     General: Lids are normal. Vision grossly intact.     Extraocular Movements: Extraocular movements intact.     Conjunctiva/sclera: Conjunctivae normal.     Pupils: Pupils are equal, round, and reactive to light.     Funduscopic exam:    Right eye: Red reflex present.        Left eye: Red reflex present.    Visual Fields: Right eye visual fields normal and left eye visual fields normal.  Neck:     Thyroid: No thyromegaly.     Vascular: No carotid bruit.  Cardiovascular:     Rate and Rhythm: Normal rate and regular rhythm.     Chest Wall: PMI is not displaced.     Pulses: Normal pulses.          Dorsalis pedis pulses are 2+ on the right side and 2+ on the left side.       Posterior tibial  pulses are 2+ on the right side and 2+ on the left side.     Heart sounds: Normal heart sounds. No murmur heard. Pulmonary:     Effort: Pulmonary effort is normal. No respiratory distress.     Breath sounds: Normal breath sounds.  Abdominal:     General: Abdomen is flat. Bowel sounds are normal. There is no distension.     Palpations: Abdomen is soft. There is no hepatomegaly, splenomegaly or mass.     Tenderness: There is no abdominal tenderness. There is no right CVA tenderness, left CVA tenderness, guarding or rebound.  Musculoskeletal:        General: Normal range of motion.     Cervical back: Full passive range of motion without pain, normal range of motion and neck supple. No tenderness.     Right lower leg: No edema.     Left lower leg: No  edema.  Feet:     Left foot:     Toenail Condition: Left toenails are normal.  Lymphadenopathy:     Cervical: No cervical adenopathy.     Upper Body:     Right upper body: No supraclavicular adenopathy.     Left upper body: No supraclavicular adenopathy.  Skin:    General: Skin is warm and dry.     Capillary Refill: Capillary refill takes less than 2 seconds.     Nails: There is no clubbing.  Neurological:     General: No focal deficit present.     Mental Status: She is alert and oriented to person, place, and time.     GCS: GCS eye subscore is 4. GCS verbal subscore is 5. GCS motor subscore is 6.     Sensory: Sensation is intact.     Motor: Motor function is intact.     Coordination: Coordination is intact.     Gait: Gait is intact.     Deep Tendon Reflexes: Reflexes are normal and symmetric.  Psychiatric:        Attention and Perception: Attention normal.        Mood and Affect: Mood normal.        Speech: Speech normal.        Behavior: Behavior normal. Behavior is cooperative.        Thought Content: Thought content normal.        Cognition and Memory: Cognition and memory normal.        Judgment: Judgment normal.      Results for orders placed or performed in visit on 01/02/23  HM MAMMOGRAPHY   Collection Time: 12/28/22 12:00 AM  Result Value Ref Range   HM Mammogram 0-4 Bi-Rad 0-4 Bi-Rad, Self Reported Normal       Assessment & Plan:   Assessment & Plan Atrial Fibrillation   Recent episode lasting from 1:30 AM to 7:00 AM. Managed with Tikosyn and Eliquis. Cardiologist recommended magnesium supplementation to reduce recurrence. Discussed magnesium's role in reducing heart irritability and AFib recurrence. Patient prefers current medications despite difficulty swallowing magnesium pills.   - Continue Tikosyn   - Continue Eliquis   - Continue magnesium supplementation   - Check magnesium levels in upcoming labs   - Consider switching to magnesium capsules for easier swallowing    Peripheral Neuropathy   Numbness and cramping in the right hand, possibly related to electrolyte imbalance. History of multiple leg surgeries and hip replacements. Discussed electrolyte imbalance causing muscle cramps and numbness. Labs to determine if supplementation is needed.   - Check electrolytes in upcoming labs    Psoriasis   Recurrent rash on legs, likely psoriasis, previously managed with clobetasol. Currently using vitamin D cream without improvement. Discussed autoimmune nature of psoriasis and potential need for steroids. Patient prefers to avoid steroids unless necessary.   - Continue vitamin D cream   - Consider using clobetasol or triamcinolone if symptoms worsen    Benign Essential Tremor   Tremor in the left hand, interfering with daily activities. No significant progression noted. Discussed that tremor is a nuisance but not worsening. Patient prefers monitoring over medication.   - Monitor for progression   - Check electrolytes in upcoming labs    Hiatal Hernia   Symptoms when eating thick soups, causing hiccups and requiring water to aid swallowing. No current need for surgical intervention.  Patient prefers conservative management unless symptoms worsen significantly.   - Monitor symptoms   -  Consider surgical consultation if symptoms worsen    Fuchs' Dystrophy   Ongoing eye discomfort and sensation of foreign body. Using saline drops and regular eye drops. Discussed using Systane Balance or Soothe XP for additional relief due to their oily base.   - Consider using Systane Balance or Soothe XP for additional relief    General Health Maintenance   Discussion of RSV vaccination, bone density scan, and general wellness. No immediate need for RSV vaccine due to low exposure risk. Bone density scan scheduled for next year. Discussed RSV risk in those over 65 and potential for pneumonia. Patient prefers to defer vaccination due to low exposure risk.   - Continue regular bone density scans   - Consider RSV vaccination if exposure risk increases    Follow-up   - Complete lab work next week   - Review lab results and adjust treatment as necessary.   Follow up plan: No follow-ups on file.  NEXT PREVENTATIVE PHYSICAL DUE IN 1 YEAR.  PATIENT COUNSELING PROVIDED FOR ALL ADULT PATIENTS: A well balanced diet low in saturated fats, cholesterol, and moderation in carbohydrates.  This can be as simple as monitoring portion sizes and cutting back on sugary beverages such as soda and juice to start with.    Daily water consumption of at least 64 ounces.  Physical activity at least 180 minutes per week.  If just starting out, start 10 minutes a day and work your way up.   This can be as simple as taking the stairs instead of the elevator and walking 2-3 laps around the office  purposefully every day.   STD protection, partner selection, and regular testing if high risk.  Limited consumption of alcoholic beverages if alcohol is consumed. For men, I recommend no more than 14 alcoholic beverages per week, spread out throughout the week (max 2 per day). Avoid "binge" drinking or consuming  large quantities of alcohol in one setting.  Please let me know if you feel you may need help with reduction or quitting alcohol consumption.   Avoidance of nicotine, if used. Please let me know if you feel you may need help with reduction or quitting nicotine use.   Daily mental health attention. This can be in the form of 5 minute daily meditation, prayer, journaling, yoga, reflection, etc.  Purposeful attention to your emotions and mental state can significantly improve your overall wellbeing  and  Health.  Please know that I am here to help you with all of your health care goals and am happy to work with you to find a solution that works best for you.  The greatest advice I have received with any changes in life are to take it one step at a time, that even means if all you can focus on is the next 60 seconds, then do that and celebrate your victories.  With any changes in life, you will have set backs, and that is OK. The important thing to remember is, if you have a set back, it is not a failure, it is an opportunity to try again! Screening Testing Mammogram Every 1 -2 years based on history and risk factors Starting at age 51 Pap Smear Ages 21-39 every 3 years Ages 32-65 every 5 years with HPV testing More frequent testing may be required based on results and history Colon Cancer Screening Every 1-10 years based on test performed, risk factors, and history Starting at age 73 Bone Density Screening Every 2-10 years based on history  Starting at age 51 for women Recommendations for men differ based on medication usage, history, and risk factors AAA Screening One time ultrasound Men 35-56 years old who have every smoked Lung Cancer Screening Low Dose Lung CT every 12 months Age 50-80 years with a 30 pack-year smoking history who still smoke or who have quit within the last 15 years   Screening Labs Routine  Labs: Complete Blood Count (CBC), Complete Metabolic Panel (CMP),  Cholesterol (Lipid Panel) Every 6-12 months based on history and medications May be recommended more frequently based on current conditions or previous results Hemoglobin A1c Lab Every 3-12 months based on history and previous results Starting at age 68 or earlier with diagnosis of diabetes, high cholesterol, BMI >26, and/or risk factors Frequent monitoring for patients with diabetes to ensure blood sugar control Thyroid Panel (TSH) Every 6 months based on history, symptoms, and risk factors May be repeated more often if on medication HIV One time testing for all patients 62 and older May be repeated more frequently for patients with increased risk factors or exposure Hepatitis C One time testing for all patients 80 and older May be repeated more frequently for patients with increased risk factors or exposure Gonorrhea, Chlamydia Every 12 months for all sexually active persons 13-24 years Additional monitoring may be recommended for those who are considered high risk or who have symptoms Every 12 months for any woman on birth control, regardless of sexual activity PSA Men 27-73 years old with risk factors Additional screening may be recommended from age 28-69 based on risk factors, symptoms, and history  Vaccine Recommendations Tetanus Booster All adults every 10 years Flu Vaccine All patients 6 months and older every year COVID Vaccine All patients 12 years and older Initial dosing with booster May recommend additional booster based on age and health history HPV Vaccine 2 doses all patients age 53-26 Dosing may be considered for patients over 26 Shingles Vaccine (Shingrix) 2 doses all adults 55 years and older Pneumonia (Pneumovax 54) All adults 65 years and older May recommend earlier dosing based on health history One year apart from Prevnar 28 Pneumonia (Prevnar 12) All adults 65 years and older Dosed 1 year after Pneumovax 23 Pneumonia (Prevnar 20) One time  alternative to the two dosing of 13 and 23 For all adults with initial dose of 23, 20 is recommended 1 year later For all adults with initial dose of 13, 23 is still recommended as second option 1 year later

## 2023-04-27 LAB — B12 AND FOLATE PANEL
Folate: >20 ng/mL (ref >3.0)
Vitamin B-12: 690 pg/mL (ref 232–1245)

## 2023-05-10 ENCOUNTER — Ambulatory Visit: Payer: Medicare Other

## 2023-05-10 DIAGNOSIS — M21752 Unequal limb length (acquired), left femur: Secondary | ICD-10-CM

## 2023-05-10 DIAGNOSIS — M2141 Flat foot [pes planus] (acquired), right foot: Secondary | ICD-10-CM

## 2023-05-10 NOTE — Progress Notes (Signed)
 Patient presents today to pick up custom molded foot orthotics, diagnosed with PF and LLD by Dr. Magdalen.   Orthotics were dispensed and fit was satisfactory. Reviewed instructions for break-in and wear. Written instructions given to patient.  Patient will follow up as needed. Lolita Schultze CPed, CFo, CFm

## 2023-05-19 ENCOUNTER — Encounter: Payer: Self-pay | Admitting: Nurse Practitioner

## 2023-05-19 ENCOUNTER — Encounter: Payer: Self-pay | Admitting: Internal Medicine

## 2023-05-23 ENCOUNTER — Other Ambulatory Visit: Payer: Self-pay | Admitting: Internal Medicine

## 2023-06-18 ENCOUNTER — Telehealth: Payer: Self-pay

## 2023-06-18 NOTE — Telephone Encounter (Signed)
 [name] called from the answering service to notify us  that the pt fell in the woods and is afraid her little finger is broken. [name] does not know if she wants an appt or advice. Please advise.

## 2023-06-19 ENCOUNTER — Other Ambulatory Visit: Payer: Self-pay | Admitting: Nurse Practitioner

## 2023-06-19 DIAGNOSIS — E039 Hypothyroidism, unspecified: Secondary | ICD-10-CM

## 2023-06-20 ENCOUNTER — Telehealth: Payer: Self-pay | Admitting: Nurse Practitioner

## 2023-06-20 DIAGNOSIS — T8484XA Pain due to internal orthopedic prosthetic devices, implants and grafts, initial encounter: Secondary | ICD-10-CM

## 2023-06-20 DIAGNOSIS — M545 Low back pain, unspecified: Secondary | ICD-10-CM

## 2023-06-20 DIAGNOSIS — R531 Weakness: Secondary | ICD-10-CM

## 2023-06-20 DIAGNOSIS — M353 Polymyalgia rheumatica: Secondary | ICD-10-CM

## 2023-06-20 DIAGNOSIS — M542 Cervicalgia: Secondary | ICD-10-CM

## 2023-06-20 DIAGNOSIS — M19041 Primary osteoarthritis, right hand: Secondary | ICD-10-CM

## 2023-06-20 DIAGNOSIS — Z96649 Presence of unspecified artificial hip joint: Secondary | ICD-10-CM

## 2023-06-20 NOTE — Telephone Encounter (Signed)
Pt called and asked if you can place a referral in for PT to [facility]. She states you all have discussed this before several times.

## 2023-06-21 ENCOUNTER — Encounter: Payer: Self-pay | Admitting: Nurse Practitioner

## 2023-06-22 ENCOUNTER — Encounter: Payer: Self-pay | Admitting: Physician Assistant

## 2023-06-22 ENCOUNTER — Other Ambulatory Visit (INDEPENDENT_AMBULATORY_CARE_PROVIDER_SITE_OTHER): Payer: Medicare Other

## 2023-06-22 ENCOUNTER — Other Ambulatory Visit: Payer: Self-pay

## 2023-06-22 ENCOUNTER — Ambulatory Visit: Payer: Medicare Other | Admitting: Physician Assistant

## 2023-06-22 DIAGNOSIS — M79641 Pain in right hand: Secondary | ICD-10-CM

## 2023-06-22 DIAGNOSIS — S6291XA Unspecified fracture of right wrist and hand, initial encounter for closed fracture: Secondary | ICD-10-CM | POA: Diagnosis not present

## 2023-06-22 DIAGNOSIS — M79604 Pain in right leg: Secondary | ICD-10-CM

## 2023-06-22 DIAGNOSIS — R29898 Other symptoms and signs involving the musculoskeletal system: Secondary | ICD-10-CM

## 2023-06-22 DIAGNOSIS — R531 Weakness: Secondary | ICD-10-CM

## 2023-06-22 DIAGNOSIS — S6291XS Unspecified fracture of right wrist and hand, sequela: Secondary | ICD-10-CM | POA: Insufficient documentation

## 2023-06-22 NOTE — Progress Notes (Signed)
Office Visit Note   Patient: [name]           Date of Birth: 1931-12-24           MRN: [id number] Visit Date: 06/22/2023              Requested by: Tollie Eth, NP [address] [place],  [place] [address] PCP: Tollie Eth, NP   Assessment & Plan: Visit Diagnoses:  1. Pain of right hand   2. Right hand fracture, closed, initial encounter     Plan: Patient is a pleasant 89 year old woman who comes in today with a 5-day history of right hand pain and bruising.  She fell onto her hand.  She was seen and evaluated in the urgent care where x-rays demonstrate minimally displaced acute fractures of the fourth and fifth proximal phalanges.  She has been in an ulnar gutter splint.  She really does not have much pain.  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  She understands she is to remain in the splint except for rinsing her hand and not to use any hand for weightbearing activities.  Will follow-up in 2 weeks with new x-rays of her hand.  Follow-Up Instructions: Return in about 2 weeks (around 07/06/2023).   Orders:  Orders Placed This Encounter  Procedures   XR Hand Complete Right   No orders of the defined types were placed in this encounter.     Procedures: No procedures performed   Clinical Data: No additional findings.   Subjective: No chief complaint on file.   HPI Patient is a pleasant 89 year old woman who is approximately 4 days status post falling onto her right hand.  She was [facility] and evaluated [facility].  Here in follow-up.  She is not having very much pain.  Denies previous injury.  Review of Systems  All other systems reviewed and are negative.    Objective: Vital Signs: There were no vitals taken for this visit.  Physical Exam Constitutional:      Appearance: Normal appearance.  Pulmonary:     Effort: Pulmonary effort is normal.  Neurological:     General: No focal deficit present.     Mental Status: She  is alert and oriented to person, place, and time.  Psychiatric:        Mood and Affect: Mood normal.        Behavior: Behavior normal.     Ortho Exam She has a strong radial pulse she has bruising in the fourth fifth and third phalanges radiates down into her hand but swelling is under control compartments are soft and tender there is no open areas of skin fingertips are warm she is able to wiggle her fingers Specialty Comments:  No specialty comments available.  Imaging: XR Hand Complete Right Result Date: 06/22/2023 Radiographs of her right hand were reviewed today she has minimally displaced fractures of the fourth and fifth proximal phalanges.  Remain in good position    PMFS History: Patient Active Problem List   Diagnosis Date Noted   Right hand fracture, closed, initial encounter 06/22/2023   Fuchs' corneal dystrophy 01/22/2023   Osteoarthritis of both hands 08/04/2021   Overactive bladder 08/04/2021   Mixed hyperlipidemia 08/03/2021   Estrogen deficiency 01/27/2021   Paroxysmal A-fib (HCC) 10/18/2020   Secondary hypercoagulable state (HCC) 08/26/2020   Osteopenia 03/24/2020   Aortic atherosclerosis (HCC) 01/26/2020   Hyponatremia 11/29/2019   History of loop recorder  11/20/2019   Vitamin D deficiency 01/14/2019   History of CVA (cerebrovascular accident) without residual deficits 01/14/2019   Nocturnal leg cramps 01/14/2019   Blepharitis of left eye    Malignant melanoma (HCC) 11/21/2018   Hyperkalemia 03/07/2018   Atrial fibrillation (HCC) 04/15/2015   Essential hypertension 03/10/2014   Failed total hip arthroplasty (HCC) 06/27/2012   Painful orthopaedic hardware (HCC) 04/05/2012   Hypothyroidism 07/27/2011   Past Medical History:  Diagnosis Date   Acute CVA (cerebrovascular accident) (HCC) 03/10/2014   Anemia    hx of with surgery    Arthritis 07/27/2011   osteoarthritis-hips/s/p bil. THA, arthritis right hand    Blepharitis of left eye    Blood  transfusion 06/2012   post surgery some time ago   Cancer [facility]) 1989   Breast cancer -s/p lt. mastectomy, some squamous cell lesions   Cold hands    Complication of anesthesia    epinephrine - screams uncontrollably / pt does not want general anesthesia or narcotics   Constipation    Dyslipidemia 04/15/2015   Elevated LDL cholesterol level 01/26/2020   Fuchs' corneal dystrophy    both eyes   Hard of hearing    both ears mild loss   Hyperkalemia 03/07/2018   Hypertension    Hypothyroidism 3-21- 13   tx. levothyroxine   Lichen sclerosus et atrophicus of the vulva    pt reported.    Neuromuscular disorder (HCC) 01/2022   Osteopenia    PMR (polymyalgia rheumatica) (HCC) 08/14/2022   Stroke [facility])    a. s/p MDT LINQ 04/2014    Family History  Problem Relation Age of Onset   Hodgkin's lymphoma Mother    Celiac disease Mother    Cancer Mother    Miscarriages / [place] Mother    Melanoma Father    Cancer Father    Cancer Brother    Arthritis Sister    Ulcerative colitis Sister    Depression Sister    Hearing loss Sister    Kidney disease Sister    Miscarriages / Stillbirths Sister    Heart disease Paternal Grandmother    Heart disease Paternal Grandfather    Vision loss Paternal Grandfather    Hearing loss Maternal Grandmother     Past Surgical History:  Procedure Laterality Date   ABDOMINAL HYSTERECTOMY     ovaries removed   ANKLE SURGERY  05/09/2003   right ankle tendon repair   basal cell removed from face     3-4 times   BREAST SURGERY  07/07/1987   lt.breast cancer intraductal cancer, mastectomy   CATARACT EXTRACTION, BILATERAL  few yrs ago   Bilateral   EYE SURGERY  9-23   FRACTURE SURGERY     HARDWARE REMOVAL  04/05/2012   Procedure: HARDWARE REMOVAL;  Surgeon: Loanne Drilling, MD;  Location: [facility];  Service: Orthopedics;  Laterality: Right;  Removal of Hardware Right Femur    HARDWARE REMOVAL Left 06/30/2013   Procedure: LEFT HIP HARDWARE REMOVAL OF  CABLES;  Surgeon: Loanne Drilling, MD;  Location: [facility];  Service: Orthopedics;  Laterality: Left;   HARDWARE REMOVAL Right 08/21/2013   Procedure: RIGHT HIP HARDWARE REMOVAL;  Surgeon: Loanne Drilling, MD;  Location: [facility];  Service: Orthopedics;  Laterality: Right;   JOINT REPLACEMENT  07/27/2011   Bilateral: Right THA - 04/2000, Left THA 06/2001   left hip replacement Left 06/08/2001   LOOP RECORDER IMPLANT  04/08/2014   MDT LINQ implanted by Dr Ladona Ridgel for  cryptogenic stroke   LOOP RECORDER REMOVAL N/A 04/26/2017   Procedure: LOOP RECORDER REMOVAL;  Surgeon: Marinus Maw, MD;  Location: [facility] INVASIVE CV LAB;  Service: Cardiovascular;  Laterality: N/A;   MASTECTOMY  05/09/1987   left - Pt states has no restrictions on use of L arm for BP or blood draw   melanoma removed Left    x 1   ORIF FEMUR FRACTURE  07/31/2011   Procedure: OPEN REDUCTION INTERNAL FIXATION (ORIF) DISTAL FEMUR FRACTURE;  Surgeon: Loanne Drilling, MD;  Location: [facility];  Service: Orthopedics;  Laterality: Right;  right femur  (c-arm)    SQUAMOUS CELL CARCINOMA EXCISION  07/27/2011   x2 left shoulder   TOTAL HIP REVISION  04/10/2012   Procedure: TOTAL HIP REVISION;  Surgeon: Loanne Drilling, MD;  Location: [facility];  Service: Orthopedics;  Laterality: Right;   TOTAL HIP REVISION Left 06/27/2012   Procedure: LEFT TOTAL HIP REVISION;  Surgeon: Loanne Drilling, MD;  Location: [facility];  Service: Orthopedics;  Laterality: Left;   Social History   Occupational History   Occupation: Retired  Tobacco Use   Smoking status: Former    Current packs/day: 0.00    Average packs/day: 1 pack/day for 10.0 years (10.0 ttl pk-yrs)    Types: Cigarettes    Start date: 05/08/1953    Quit date: 05/09/1963    Years since quitting: 60.1    Passive exposure: Never   Smokeless tobacco: Never  Vaping Use   Vaping status: Never Used  Substance and Sexual Activity   Alcohol use: Not Currently    Alcohol/week: 1.0 standard drink of alcohol     Types: 1 Glasses of wine per week    Comment: very little   Drug use: No   Sexual activity: Not Currently

## 2023-06-25 ENCOUNTER — Encounter: Payer: Self-pay | Admitting: Internal Medicine

## 2023-06-25 DIAGNOSIS — I10 Essential (primary) hypertension: Secondary | ICD-10-CM

## 2023-06-26 MED ORDER — HYDRALAZINE HCL 25 MG PO TABS: 25.0000 mg | ORAL_TABLET | Freq: Three times a day (TID) | ORAL | 3 refills | Status: DC

## 2023-06-26 NOTE — Telephone Encounter (Signed)
ICD-10-CM   1. Primary hypertension  I10 hydrALAZINE (APRESOLINE) 25 MG tablet     No orders of the defined types were placed in this encounter.   Meds ordered this encounter  Medications   hydrALAZINE (APRESOLINE) 25 MG tablet    Sig: Take 1 tablet (25 mg total) by mouth 3 (three) times daily. Take one extra tab if BP > 150 mm Hg    Dispense:  270 tablet    Refill:  3

## 2023-06-28 ENCOUNTER — Encounter: Payer: Self-pay | Admitting: Nurse Practitioner

## 2023-06-29 ENCOUNTER — Other Ambulatory Visit: Payer: Self-pay

## 2023-06-29 DIAGNOSIS — M79605 Pain in left leg: Secondary | ICD-10-CM

## 2023-07-03 ENCOUNTER — Telehealth: Payer: Self-pay | Admitting: Family Medicine

## 2023-07-03 NOTE — Telephone Encounter (Signed)
 Please see MC messages starting date 06/29/2023. [name] was able to determine that she does not need to have her legs measured after speaking directly with [employer] and has updated the patient.

## 2023-07-03 NOTE — Telephone Encounter (Signed)
 Copied from CRM [id number]. Topic: Clinical - Medication Question >> Jul 03, 2023  9:53 AM Antony Haste wrote: Reason for CRM: PT is requesting an "orthotic prescription" to measure the length of her legs in order to go to [employer] to make a built-up shoe. She states she is needing this to measure both of her legs and she has requested this previously but has not received an update yet.  Callback [phone]

## 2023-07-06 ENCOUNTER — Other Ambulatory Visit (INDEPENDENT_AMBULATORY_CARE_PROVIDER_SITE_OTHER): Payer: Self-pay

## 2023-07-06 ENCOUNTER — Ambulatory Visit: Payer: Medicare Other | Admitting: Physician Assistant

## 2023-07-06 ENCOUNTER — Encounter: Payer: Self-pay | Admitting: Physician Assistant

## 2023-07-06 DIAGNOSIS — S6291XA Unspecified fracture of right wrist and hand, initial encounter for closed fracture: Secondary | ICD-10-CM

## 2023-07-06 DIAGNOSIS — M79641 Pain in right hand: Secondary | ICD-10-CM

## 2023-07-06 NOTE — Progress Notes (Signed)
 Office Visit Note   Patient: [name]           Date of Birth: [birth year]           MRN: [id number] Visit Date: 07/06/2023              Requested by: Tollie Eth, NP 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  She comes in today 3 weeks status post fourth and fifth proximal phalanx fractures that have been treated with an ulnar gutter splint.  She does say that it is less painful.  She admits she has been coming out of the splint a little bit but not using her fourth and fifth fingers  Assessment & Plan: Visit Diagnoses:  1. Pain of right hand   2. Right hand fracture, closed, initial encounter     Plan: Reviewed her case with Dr. August Saucer.  She can come out of the splint 3-4 times a day to do half a fist exercises for range of motion she understands if it hurts that she is not to do this.  Her exam is benign today although she is weak with abduction of the short finger.  Concerning for a ligament injury we will reevaluate that this next week.  She understands if she does too much motion she could displace the fractures causing a need for surgery.  She is well aware of this.  I have also arranged for her to see our occupational therapist after her appointment with me next week for possible functional bracing.  Follow-Up Instructions: No follow-ups on file.   Ortho Exam  Patient is alert, oriented, no adenopathy, well-dressed, normal affect, normal respiratory effort. Examination she has brisk capillary refill resolving ecchymosis nontender over the fracture sites she does have some weakness with abduction of the short finger.  No mobility at the fracture sites and no tenderness  Imaging: XR Hand Complete Right Result Date: 07/06/2023 X-rays today demonstrate fractures remain in good position she  does have early callus reviewed with Dr. August Saucer  No images are attached to the encounter.  Labs: Lab Results  Component Value Date   HGBA1C 5.5 03/11/2014   ESRSEDRATE 13 06/23/2021   ESRSEDRATE 72 (H) 02/26/2020   ESRSEDRATE 10 08/16/2017   CRP 54 (H) 02/26/2020   REPTSTATUS 11/29/2019 FINAL 11/28/2019   CULT  11/28/2019    NO GROWTH Performed at [facility], [address] [place], [place] [address]      Lab Results  Component Value Date   ALBUMIN 4.5 04/11/2022   ALBUMIN 4.4 01/31/2022   ALBUMIN 4.6 01/27/2021    Lab Results  Component Value Date   MG 2.2 11/01/2020   MG 1.8 10/21/2020   MG 2.1 10/20/2020   Lab Results  Component Value Date   VD25OH 60.4 04/11/2022   VD25OH 57.3 01/27/2021   VD25OH 51.0 01/26/2020    No results found for: "PREALBUMIN"    Latest Ref Rng & Units 04/11/2022    9:25 AM 01/31/2022    8:42 AM 06/23/2021    3:28 PM  CBC EXTENDED  WBC 3.4 - 10.8 x10E3/uL 6.5  6.4  6.6   RBC 3.77 - 5.28 x10E6/uL 3.66  3.73  3.78   Hemoglobin 11.1 - 15.9 g/dL 11.8  12.2  11.9   HCT 34.0 - 46.6 % 35.1  36.3  35.6   Platelets 150 - 450 x10E3/uL 208  232  249   NEUT# 1.4 - 7.0 x10E3/uL 3.9  3.5    Lymph# 0.7 - 3.1 x10E3/uL 1.4  1.8       There is no height or weight on file to calculate BMI.  Orders:  Orders Placed This Encounter  Procedures   XR Hand Complete Right   Ambulatory referral to Occupational Therapy   No orders of the defined types were placed in this encounter.    Procedures: No procedures performed  Clinical Data: No additional findings.  ROS:  All other systems negative, except as noted in the HPI. Review of Systems  Objective: Vital Signs: There were no vitals taken for this visit.  Specialty Comments:  No specialty comments available.  PMFS History: Patient Active Problem List   Diagnosis Date Noted   Right hand fracture, closed, initial encounter 06/22/2023   Fuchs' corneal dystrophy 01/22/2023    Osteoarthritis of both hands 08/04/2021   Overactive bladder 08/04/2021   Mixed hyperlipidemia 08/03/2021   Estrogen deficiency 01/27/2021   Paroxysmal A-fib (HCC) 10/18/2020   Secondary hypercoagulable state (HCC) 08/26/2020   Osteopenia 03/24/2020   Aortic atherosclerosis (HCC) 01/26/2020   Hyponatremia 11/29/2019   History of loop recorder 11/20/2019   Vitamin D deficiency 01/14/2019   History of CVA (cerebrovascular accident) without residual deficits 01/14/2019   Nocturnal leg cramps 01/14/2019   Blepharitis of left eye    Malignant melanoma (HCC) 11/21/2018   Hyperkalemia 03/07/2018   Atrial fibrillation (HCC) 04/15/2015   Essential hypertension 03/10/2014   Failed total hip arthroplasty (HCC) 06/27/2012   Painful orthopaedic hardware (HCC) 04/05/2012   Hypothyroidism 07/27/2011   Past Medical History:  Diagnosis Date   Acute CVA (cerebrovascular accident) (HCC) 03/10/2014   Anemia    hx of with surgery    Arthritis 07/27/2011   osteoarthritis-hips/s/p bil. THA, arthritis right hand    Blepharitis of left eye    Blood transfusion 06/2012   post surgery some time ago   Cancer [facility]) 1989   Breast cancer -s/p lt. mastectomy, some squamous cell lesions   Cold hands    Complication of anesthesia    epinephrine - screams uncontrollably / pt does not want general anesthesia or narcotics   Constipation    Dyslipidemia 04/15/2015   Elevated LDL cholesterol level 01/26/2020   Fuchs' corneal dystrophy    both eyes   Hard of hearing    both ears mild loss   Hyperkalemia 03/07/2018   Hypertension    Hypothyroidism 3-21- 13   tx. levothyroxine   Lichen sclerosus et atrophicus of the vulva    pt reported.    Neuromuscular disorder (HCC) 01/2022   Osteopenia    PMR (polymyalgia rheumatica) (HCC) 08/14/2022   Stroke [facility])    a. s/p MDT LINQ 04/2014    Family History  Problem Relation Age of Onset   Hodgkin's lymphoma Mother    Celiac disease Mother    Cancer Mother     Miscarriages / [place] Mother    Melanoma Father    Cancer Father    Cancer Brother    Arthritis Sister    Ulcerative colitis Sister    Depression Sister    Hearing loss Sister    Kidney disease Sister    Miscarriages / Stillbirths Sister    Heart disease Paternal Grandmother    Heart disease Paternal  Grandfather    Vision loss Paternal Grandfather    Hearing loss Maternal Grandmother     Past Surgical History:  Procedure Laterality Date   ABDOMINAL HYSTERECTOMY     ovaries removed   ANKLE SURGERY  05/09/2003   right ankle tendon repair   basal cell removed from face     3-4 times   BREAST SURGERY  07/07/1987   lt.breast cancer intraductal cancer, mastectomy   CATARACT EXTRACTION, BILATERAL  few yrs ago   Bilateral   EYE SURGERY  9-23   FRACTURE SURGERY     HARDWARE REMOVAL  04/05/2012   Procedure: HARDWARE REMOVAL;  Surgeon: Loanne Drilling, MD;  Location: [facility];  Service: Orthopedics;  Laterality: Right;  Removal of Hardware Right Femur    HARDWARE REMOVAL Left 06/30/2013   Procedure: LEFT HIP HARDWARE REMOVAL OF CABLES;  Surgeon: Loanne Drilling, MD;  Location: [facility];  Service: Orthopedics;  Laterality: Left;   HARDWARE REMOVAL Right 08/21/2013   Procedure: RIGHT HIP HARDWARE REMOVAL;  Surgeon: Loanne Drilling, MD;  Location: [facility];  Service: Orthopedics;  Laterality: Right;   JOINT REPLACEMENT  07/27/2011   Bilateral: Right THA - 04/2000, Left THA 06/2001   left hip replacement Left 06/08/2001   LOOP RECORDER IMPLANT  04/08/2014   MDT LINQ implanted by Dr Ladona Ridgel for cryptogenic stroke   LOOP RECORDER REMOVAL N/A 04/26/2017   Procedure: LOOP RECORDER REMOVAL;  Surgeon: Marinus Maw, MD;  Location: [facility] INVASIVE CV LAB;  Service: Cardiovascular;  Laterality: N/A;   MASTECTOMY  05/09/1987   left - Pt states has no restrictions on use of L arm for BP or blood draw   melanoma removed Left    x 1   ORIF FEMUR FRACTURE  07/31/2011   Procedure: OPEN REDUCTION  INTERNAL FIXATION (ORIF) DISTAL FEMUR FRACTURE;  Surgeon: Loanne Drilling, MD;  Location: [facility];  Service: Orthopedics;  Laterality: Right;  right femur  (c-arm)    SQUAMOUS CELL CARCINOMA EXCISION  07/27/2011   x2 left shoulder   TOTAL HIP REVISION  04/10/2012   Procedure: TOTAL HIP REVISION;  Surgeon: Loanne Drilling, MD;  Location: [facility];  Service: Orthopedics;  Laterality: Right;   TOTAL HIP REVISION Left 06/27/2012   Procedure: LEFT TOTAL HIP REVISION;  Surgeon: Loanne Drilling, MD;  Location: [facility];  Service: Orthopedics;  Laterality: Left;   Social History   Occupational History   Occupation: Retired  Tobacco Use   Smoking status: Former    Current packs/day: 0.00    Average packs/day: 1 pack/day for 10.0 years (10.0 ttl pk-yrs)    Types: Cigarettes    Start date: 05/08/1953    Quit date: 05/09/1963    Years since quitting: 60.2    Passive exposure: Never   Smokeless tobacco: Never  Vaping Use   Vaping status: Never Used  Substance and Sexual Activity   Alcohol use: Not Currently    Alcohol/week: 1.0 standard drink of alcohol    Types: 1 Glasses of wine per week    Comment: very little   Drug use: No   Sexual activity: Not Currently

## 2023-07-07 ENCOUNTER — Other Ambulatory Visit: Payer: Self-pay | Admitting: Nurse Practitioner

## 2023-07-12 NOTE — Therapy (Signed)
 OUTPATIENT OCCUPATIONAL THERAPY ORTHO EVALUATION  Patient Name: [name] MRN: [id number] DOB:[birth year], 89 y.o., female Today's Date: 07/13/2023  PCP: Bosie Helper NP REFERRING PROVIDER: Persons, [place], [place]   END OF SESSION:  OT End of Session - 07/13/23 1107     Visit Number 1    Number of Visits 6    Date for OT Re-Evaluation 08/24/23    Authorization Type [employer]    OT Start Time 1107    OT Stop Time 1201    OT Time Calculation (min) 54 min    Equipment Utilized During Treatment Orthotic materials    Activity Tolerance Patient tolerated treatment well;No increased pain;Patient limited by pain    Behavior During Therapy [facility] for tasks assessed/performed             Past Medical History:  Diagnosis Date   Acute CVA (cerebrovascular accident) (HCC) 03/10/2014   Anemia    hx of with surgery    Arthritis 07/27/2011   osteoarthritis-hips/s/p bil. THA, arthritis right hand    Blepharitis of left eye    Blood transfusion 06/2012   post surgery some time ago   Cancer [facility]) 1989   Breast cancer -s/p lt. mastectomy, some squamous cell lesions   Cold hands    Complication of anesthesia    epinephrine - screams uncontrollably / pt does not want general anesthesia or narcotics   Constipation    Dyslipidemia 04/15/2015   Elevated LDL cholesterol level 01/26/2020   Fuchs' corneal dystrophy    both eyes   Hard of hearing    both ears mild loss   Hyperkalemia 03/07/2018   Hypertension    Hypothyroidism 3-21- 13   tx. levothyroxine   Lichen sclerosus et atrophicus of the vulva    pt reported.    Neuromuscular disorder (HCC) 01/2022   Osteopenia    PMR (polymyalgia rheumatica) (HCC) 08/14/2022   Stroke [facility])    a. s/p MDT LINQ 04/2014   Past Surgical History:  Procedure Laterality Date   ABDOMINAL HYSTERECTOMY     ovaries removed   ANKLE SURGERY  05/09/2003   right ankle tendon repair   basal cell removed from face     3-4 times   BREAST SURGERY  07/07/1987    lt.breast cancer intraductal cancer, mastectomy   CATARACT EXTRACTION, BILATERAL  few yrs ago   Bilateral   EYE SURGERY  9-23   FRACTURE SURGERY     HARDWARE REMOVAL  04/05/2012   Procedure: HARDWARE REMOVAL;  Surgeon: Loanne Drilling, MD;  Location: [facility];  Service: Orthopedics;  Laterality: Right;  Removal of Hardware Right Femur    HARDWARE REMOVAL Left 06/30/2013   Procedure: LEFT HIP HARDWARE REMOVAL OF CABLES;  Surgeon: Loanne Drilling, MD;  Location: [facility];  Service: Orthopedics;  Laterality: Left;   HARDWARE REMOVAL Right 08/21/2013   Procedure: RIGHT HIP HARDWARE REMOVAL;  Surgeon: Loanne Drilling, MD;  Location: [facility];  Service: Orthopedics;  Laterality: Right;   JOINT REPLACEMENT  07/27/2011   Bilateral: Right THA - 04/2000, Left THA 06/2001   left hip replacement Left 06/08/2001   LOOP RECORDER IMPLANT  04/08/2014   MDT LINQ implanted by Dr Ladona Ridgel for cryptogenic stroke   LOOP RECORDER REMOVAL N/A 04/26/2017   Procedure: LOOP RECORDER REMOVAL;  Surgeon: Marinus Maw, MD;  Location[facility] INVASIVE CV LAB;  Service: Cardiovascular;  Laterality: N/A;   MASTECTOMY  05/09/1987   left - Pt states has no restrictions on  use of L arm for BP or blood draw   melanoma removed Left    x 1   ORIF FEMUR FRACTURE  07/31/2011   Procedure: OPEN REDUCTION INTERNAL FIXATION (ORIF) DISTAL FEMUR FRACTURE;  Surgeon: Loanne Drilling, MD;  Location: [facility];  Service: Orthopedics;  Laterality: Right;  right femur  (c-arm)    SQUAMOUS CELL CARCINOMA EXCISION  07/27/2011   x2 left shoulder   TOTAL HIP REVISION  04/10/2012   Procedure: TOTAL HIP REVISION;  Surgeon: Loanne Drilling, MD;  Location: [facility];  Service: Orthopedics;  Laterality: Right;   TOTAL HIP REVISION Left 06/27/2012   Procedure: LEFT TOTAL HIP REVISION;  Surgeon: Loanne Drilling, MD;  Location: [facility];  Service: Orthopedics;  Laterality: Left;   Patient Active Problem List   Diagnosis Date Noted   Right hand fracture, closed,  initial encounter 06/22/2023   Fuchs' corneal dystrophy 01/22/2023   Osteoarthritis of both hands 08/04/2021   Overactive bladder 08/04/2021   Mixed hyperlipidemia 08/03/2021   Estrogen deficiency 01/27/2021   Paroxysmal A-fib (HCC) 10/18/2020   Secondary hypercoagulable state (HCC) 08/26/2020   Osteopenia 03/24/2020   Aortic atherosclerosis (HCC) 01/26/2020   Hyponatremia 11/29/2019   History of loop recorder 11/20/2019   Vitamin D deficiency 01/14/2019   History of CVA (cerebrovascular accident) without residual deficits 01/14/2019   Nocturnal leg cramps 01/14/2019   Blepharitis of left eye    Malignant melanoma (HCC) 11/21/2018   Hyperkalemia 03/07/2018   Atrial fibrillation (HCC) 04/15/2015   Essential hypertension 03/10/2014   Failed total hip arthroplasty (HCC) 06/27/2012   Painful orthopaedic hardware (HCC) 04/05/2012   Hypothyroidism 07/27/2011    ONSET DATE: DOI 06/17/23    REFERRING DIAG: Z61.09UE (ICD-10-CM) - Right hand fracture, closed, initial encounter   THERAPY DIAG:  Stiffness of right hand, not elsewhere classified - Plan: Ot plan of care cert/re-cert  Localized edema - Plan: Ot plan of care cert/re-cert  Muscle weakness (generalized) - Plan: Ot plan of care cert/re-cert  Pain in right hand - Plan: Ot plan of care cert/re-cert  Rationale for Evaluation and Treatment: Rehabilitation  SUBJECTIVE:   SUBJECTIVE STATEMENT: She states not having any significant pain in her hands today, but also that she is on Tylenol and it masks her pain.  She states that she is eager to use her hand to play the violin and do things around the house.  She is wearing a prefabricated wrist and hand brace today which she states is uncomfortable, loose, not working well for her.  She also does not need to have the wrist immobilized at this point.   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 She comes in today 3 weeks status post fourth and fifth  proximal phalanx fractures that have been treated with an ulnar gutter splint. She does say that it is less painful. She admits she has been coming out of the splint a little bit but not using her fourth and fifth fingers   PRECAUTIONS: None  RED FLAGS: None   WEIGHT BEARING RESTRICTIONS: Yes nonweightbearing in right hand for at least 3-4 more weeks  PAIN:  Are you having pain?  No pain at rest today but she also states being medicated  FALLS: Has patient fallen in last 6 months? Yes. Number of falls 1 (this accident-not considered a significant fall risk)  LIVING ENVIRONMENT: Lives with: lives alone Lives in: House/apartment  PLOF: Independent  PATIENT GOALS: To improve use of right  dominant hand and eventually return to playing violin  NEXT MD VISIT: 07/27/2023  OBJECTIVE: (All objective assessments below are from initial evaluation on: 07/13/2023 unless otherwise specified.)    HAND DOMINANCE: Right   ADLs: Overall ADLs: States decreased ability to play violin, perform basic ADLs, hiking, applying ointments, cooking cleaning, etc., etc.   FUNCTIONAL OUTCOME MEASURES: Eval: Patient Specific Functional Scale: 6.5 (Play violin, performed BADLs, hike, cooking/cleaning)  (Higher Score  =  Better Ability for the Selected Tasks)     UPPER EXTREMITY ROM     Shoulder to Wrist AROM Right eval  Wrist flexion 58  Wrist extension 50  (Blank rows = not tested)   Hand AROM Right eval  Full Fist Ability (or Gap to Distal Palmar Crease) Unable due to stiffness in the small and ring fingers of the right hand  Thumb Opposition  (Kapandji Scale)  Within functional limits  Thumb MCP (0-60)   Thumb IP (0-80)   Thumb Radial Abduction Span   Thumb Palmar Abduction Span   Index MCP (0-90)   Index PIP (0-100)   Index DIP (0-70)    Long MCP (0-90)    Long PIP (0-100)    Long DIP (0-70)    Ring MCP (0-90) 0 -52   Ring PIP (0-100) 0-  90  Ring DIP (0-70) 0-  63  Little MCP (0-90)  (+5) -  40  Little PIP (0-100)  (-10) - 52  Little DIP (0-70)  0-  44  (Blank rows = not tested)   UPPER EXTREMITY MMT:    Eval:  NT at eval due to recent and still healing injuries. Will be tested when appropriate.   MMT Right TBD  Elbow flexion   Elbow extension   Forearm supination   Forearm pronation   Wrist flexion   Wrist extension   Wrist ulnar deviation   Wrist radial deviation   (Blank rows = not tested)  HAND FUNCTION: Eval: Observed weakness in affected right hand.  Details will be tested when safe Grip strength Right: TBD lbs, Left: TBD lbs   COORDINATION: Eval: Observed coordination impairments with affected right hand.  Details will be tested in approximately 2 weeks Box and Blocks Test: Right: TBD blocks today (TBD is [facility])  SENSATION: Eval:  Light touch intact today  EDEMA:   Eval:  Mildly swollen in right hand   COGNITION: Eval: Overall cognitive status: WFL for evaluation today   OBSERVATIONS:   Eval: Dorsum of MCP joint slightly swollen, no apparent pain with light active range of motion, no significant angulation deformities but small finger PIP joint has a bit of flexion contracture forming.  Middle finger is actually somewhat angulated ulnarly.  Healing right hand P1 fractures of digits 4 and 5   TODAY'S TREATMENT:  Post-evaluation treatment:   Custom orthotic fabrication was indicated due to pt's healing right hand fractures and need for safe, functional positioning. OT fabricated custom hand-based safe position orthosis including digits 4 and 5 for pt today to immobilize digits 4 and 5 and protect them. It fit well with no areas of pressure, pt states a comfortable fit. Pt was educated on the wearing schedule (on at all times except for hygiene and exercises), to avoid exposing it to sources of heat, to wipe clean as needed (do not wash, use harsh detergents), to call or come in ASAP if it is causing any irritation or is not achieving desired  function. It will be checked/adjusted in upcoming sessions,  as needed. Pt states understanding all directions.    For self-care/safety she was educated to do no weightbearing or significant functional activities with the right hand now.  If it weighs less than a pound, she can use digits 1 through 3 to pick it up and manipulate it as long as it is not painful.  She can carefully wash and bathe her hand but should not "use her hand" for this task.  She was also given the following home exercise program to perform 4-5 times a day as tolerated.  In about 1 week as tolerated, she can start to try to make a full fist versus a half fist now.  She trials these exercises several times and has no pain or problems with demonstrates understanding directions, leaves without pain.  Exercises - Bend and Pull Back Wrist SLOWLY  - 4-5 x daily - 10 reps - Half Fist AROM  - 4-5 x daily - 15 reps - Hand AROM Reverse Blocking  - 4-5 x daily - 15 reps   PATIENT EDUCATION: Education details: See tx section above for details  Person educated: Patient Education method: Verbal Instruction, Teach back, Handouts  Education comprehension: States and demonstrates understanding, Additional Education required    HOME EXERCISE PROGRAM: Access Code: [email or web address] URL: [email or web address] Date: 07/13/2023 Prepared by: Fannie Knee   GOALS: Goals reviewed with patient? Yes   SHORT TERM GOALS: (STG required if POC>30 days) Target Date: 07/27/2023  Pt will obtain protective, custom orthotic. Goal status: 07/13/2023: MET   2.  Pt will demo/state understanding of initial HEP to improve pain levels and prerequisite motion. Goal status: INITIAL   LONG TERM GOALS: Target Date: 08/24/2023  Pt will improve functional ability by decreased impairment per PSFS assessment from 6.5 to 8.5 or better, for better quality of life. Goal status: INITIAL  2.  Pt will improve grip strength in right hand from unsafe  to test to at least 25 lbs for functional use at home and in IADLs. Goal status: INITIAL  3.  Pt will improve A/ROM in right small finger total active motion from 131 degrees to at least 200 degrees, to have functional motion for tasks like reach and grasp.  Goal status: INITIAL  4.  Pt will improve coordination skills in right hand and arm, as seen by within functional limit score on box and blocks/nine-hole peg testing to have increased functional ability to carry out fine motor tasks (fasteners, etc.) and more complex, coordinated IADLs (meal prep, sports, etc.).  Goal status: INITIAL    ASSESSMENT:  CLINICAL IMPRESSION: Patient is a 89 y.o. female who was seen today for occupational therapy evaluation for fractured right hand digits 4 and 5 and subsequent stiffness, weakness, pain and decreased functional ability for which she will benefit from outpatient occupational therapy to safely progress to increase function and quality of life.   PERFORMANCE DEFICITS: in functional skills including ADLs, IADLs, coordination, dexterity, ROM, strength, pain, flexibility, Fine motor control, body mechanics, endurance, decreased knowledge of precautions, and UE functional use, cognitive skills including problem solving and safety awareness, and psychosocial skills including coping strategies, environmental adaptation, and habits.   IMPAIRMENTS: are limiting patient from ADLs, IADLs, and leisure.   COMORBIDITIES: may have co-morbidities  that affects occupational performance. Patient will benefit from skilled OT to address above impairments and improve overall function.  MODIFICATION OR ASSISTANCE TO COMPLETE EVALUATION: No modification of tasks or assist necessary to complete an evaluation.  OT OCCUPATIONAL PROFILE AND HISTORY: Problem  focused assessment: Including review of records relating to presenting problem.  CLINICAL DECISION MAKING: LOW - limited treatment options, no task modification  necessary  REHAB POTENTIAL: Excellent  EVALUATION COMPLEXITY: Low      PLAN:  OT FREQUENCY: Up to once a week  OT DURATION: 6 weeks through 08/24/2023 and up to 6 total visits as needed  PLANNED INTERVENTIONS: 97168 OT Re-evaluation, 97535 self care/ADL training, 16109 therapeutic exercise, 97530 therapeutic activity, 97112 neuromuscular re-education, 97140 manual therapy, 97035 ultrasound, 97018 paraffin, 60454 fluidotherapy, 97010 moist heat, 97010 cryotherapy, 97760 Orthotics management and training, 09811 Splinting (initial encounter), [id number] Subsequent splinting/medication, coping strategies training, and patient/family education  RECOMMENDED OTHER SERVICES: None now  CONSULTED AND AGREED WITH PLAN OF CARE: Patient  PLAN FOR NEXT SESSION:   The plan is for her to return in 2 weeks to upgrade HEP and follow-up with her provider to ensure adequate healing.  She was told that she can come in sooner if she is having any pain or problems or needs orthotic adjustment.   Fannie Knee, OTR/L, CHT 07/13/2023, 12:41 PM   Date of referral: 07/06/23 Referring provider: Persons, [place], [place]  Referring diagnosis? S62.91XA (ICD-10-CM) - Right hand fracture, closed, initial encounter  Treatment diagnosis? (if different than referring diagnosis) M25.641, M62.81  What was this (referring dx) caused by? Thana Ates of Condition: Initial Onset (within last 3 months)   Laterality: Rt  Current Functional Measure Score: Other patient specific functional scale: 6.3  Objective measurements identify impairments when they are compared to normal values, the uninvolved extremity, and prior level of function.  [x]  Yes  []  No  Objective assessment of functional ability: Minimal functional limitations   Briefly describe symptoms: Stiffness, weakness and safety risks to reinjure her healing fracture, functional problems  How did symptoms start: After breaking her hand from falling  down  Average pain intensity:  Last 24 hours: 0-1/10  Past week: Generally less than 1-2/10  How often does the pt experience symptoms? Intermittently  How much have the symptoms interfered with usual daily activities? A little bit  How has condition changed since care began at this facility? NA - initial visit  In general, how is the patients overall health? Very Good   BACK PAIN (STarT Back Screening Tool) No

## 2023-07-13 ENCOUNTER — Encounter: Payer: Self-pay | Admitting: Physician Assistant

## 2023-07-13 ENCOUNTER — Ambulatory Visit: Payer: Medicare Other | Admitting: Physician Assistant

## 2023-07-13 ENCOUNTER — Ambulatory Visit: Payer: Medicare Other | Admitting: Rehabilitative and Restorative Service Providers"

## 2023-07-13 ENCOUNTER — Ambulatory Visit: Payer: Self-pay

## 2023-07-13 ENCOUNTER — Encounter: Payer: Self-pay | Admitting: Rehabilitative and Restorative Service Providers"

## 2023-07-13 DIAGNOSIS — M25641 Stiffness of right hand, not elsewhere classified: Secondary | ICD-10-CM | POA: Diagnosis not present

## 2023-07-13 DIAGNOSIS — S6291XA Unspecified fracture of right wrist and hand, initial encounter for closed fracture: Secondary | ICD-10-CM

## 2023-07-13 DIAGNOSIS — M79641 Pain in right hand: Secondary | ICD-10-CM | POA: Diagnosis not present

## 2023-07-13 DIAGNOSIS — M6281 Muscle weakness (generalized): Secondary | ICD-10-CM | POA: Diagnosis not present

## 2023-07-13 DIAGNOSIS — R600 Localized edema: Secondary | ICD-10-CM | POA: Diagnosis not present

## 2023-07-13 NOTE — Progress Notes (Signed)
 Office Visit Note   Patient: [name]           Date of Birth: 1932-02-16           MRN: [id number] Visit Date: 07/13/2023              Requested by: Tollie Eth, NP [address] [place],  [place] [address] PCP: Tollie Eth, NP  Chief Complaint  Patient presents with   Right Hand - Follow-up      HPI: Patient is almost 4 weeks status post falling onto her right hand.  She states sustained fractures of the fourth and fifth proximal phalanges.  She has been immobilized in an ulnar gutter splint.  She feels she is doing well.  She has been coming out of the splint to do half a fist exercises.  Does not really have any pain  Assessment & Plan: Visit Diagnoses:  1. Right hand fracture, closed, initial encounter     Plan: She will go and see Nate today from fashion her a smaller functional brace.  Can start working on range of motion.  Fractures are stable.  Will see me back in 2 weeks  Follow-Up Instructions: Return in about 2 weeks (around 07/27/2023).   Ortho Exam  Patient is alert, oriented, no adenopathy, well-dressed, normal affect, normal respiratory effort. Right hand resolving ecchymosis in the ring and pinky finger.  She has ability to easily half grip.  She can extend her fingers.  She has good abductor and abductor strength.  Nontender to palpation brisk capillary refill  Imaging: No results found. No images are attached to the encounter.  Labs: Lab Results  Component Value Date   HGBA1C 5.5 03/11/2014   ESRSEDRATE 13 06/23/2021   ESRSEDRATE 72 (H) 02/26/2020   ESRSEDRATE 10 08/16/2017   CRP 54 (H) 02/26/2020   REPTSTATUS 11/29/2019 FINAL 11/28/2019   CULT  11/28/2019    NO GROWTH Performed at [facility], [address], [place], [place] [address]      Lab Results  Component Value Date   ALBUMIN 4.5 04/11/2022   ALBUMIN 4.4 01/31/2022   ALBUMIN 4.6 01/27/2021    Lab Results  Component Value Date   MG 2.2 11/01/2020   MG  1.8 10/21/2020   MG 2.1 10/20/2020   Lab Results  Component Value Date   VD25OH 60.4 04/11/2022   VD25OH 57.3 01/27/2021   VD25OH 51.0 01/26/2020    No results found for: "PREALBUMIN"    Latest Ref Rng & Units 04/11/2022    9:25 AM 01/31/2022    8:42 AM 06/23/2021    3:28 PM  CBC EXTENDED  WBC 3.4 - 10.8 x10E3/uL 6.5  6.4  6.6   RBC 3.77 - 5.28 x10E6/uL 3.66  3.73  3.78   Hemoglobin 11.1 - 15.9 g/dL 47.4  25.9  56.3   HCT 34.0 - 46.6 % 35.1  36.3  35.6   Platelets 150 - 450 x10E3/uL 208  232  249   NEUT# 1.4 - 7.0 x10E3/uL 3.9  3.5    Lymph# 0.7 - 3.1 x10E3/uL 1.4  1.8       There is no height or weight on file to calculate BMI.  Orders:  Orders Placed This Encounter  Procedures   XR Hand Complete Right   No orders of the defined types were placed in this encounter.    Procedures: No procedures performed  Clinical Data: No additional findings.  ROS:  All  other systems negative, except as noted in the HPI. Review of Systems  Objective: Vital Signs: There were no vitals taken for this visit.  Specialty Comments:  No specialty comments available.  PMFS History: Patient Active Problem List   Diagnosis Date Noted   Right hand fracture, closed, initial encounter 06/22/2023   Fuchs' corneal dystrophy 01/22/2023   Osteoarthritis of both hands 08/04/2021   Overactive bladder 08/04/2021   Mixed hyperlipidemia 08/03/2021   Estrogen deficiency 01/27/2021   Paroxysmal A-fib (HCC) 10/18/2020   Secondary hypercoagulable state (HCC) 08/26/2020   Osteopenia 03/24/2020   Aortic atherosclerosis (HCC) 01/26/2020   Hyponatremia 11/29/2019   History of loop recorder 11/20/2019   Vitamin D deficiency 01/14/2019   History of CVA (cerebrovascular accident) without residual deficits 01/14/2019   Nocturnal leg cramps 01/14/2019   Blepharitis of left eye    Malignant melanoma (HCC) 11/21/2018   Hyperkalemia 03/07/2018   Atrial fibrillation (HCC) 04/15/2015   Essential  hypertension 03/10/2014   Failed total hip arthroplasty (HCC) 06/27/2012   Painful orthopaedic hardware (HCC) 04/05/2012   Hypothyroidism 07/27/2011   Past Medical History:  Diagnosis Date   Acute CVA (cerebrovascular accident) (HCC) 03/10/2014   Anemia    hx of with surgery    Arthritis 07/27/2011   osteoarthritis-hips/s/p bil. THA, arthritis right hand    Blepharitis of left eye    Blood transfusion 06/2012   post surgery some time ago   Cancer [facility]) 1989   Breast cancer -s/p lt. mastectomy, some squamous cell lesions   Cold hands    Complication of anesthesia    epinephrine - screams uncontrollably / pt does not want general anesthesia or narcotics   Constipation    Dyslipidemia 04/15/2015   Elevated LDL cholesterol level 01/26/2020   Fuchs' corneal dystrophy    both eyes   Hard of hearing    both ears mild loss   Hyperkalemia 03/07/2018   Hypertension    Hypothyroidism 3-21- 13   tx. levothyroxine   Lichen sclerosus et atrophicus of the vulva    pt reported.    Neuromuscular disorder (HCC) 01/2022   Osteopenia    PMR (polymyalgia rheumatica) (HCC) 08/14/2022   Stroke [facility])    a. s/p MDT LINQ 04/2014    Family History  Problem Relation Age of Onset   Hodgkin's lymphoma Mother    Celiac disease Mother    Cancer Mother    Miscarriages / [place] Mother    Melanoma Father    Cancer Father    Cancer Brother    Arthritis Sister    Ulcerative colitis Sister    Depression Sister    Hearing loss Sister    Kidney disease Sister    Miscarriages / Stillbirths Sister    Heart disease Paternal Grandmother    Heart disease Paternal Grandfather    Vision loss Paternal Grandfather    Hearing loss Maternal Grandmother     Past Surgical History:  Procedure Laterality Date   ABDOMINAL HYSTERECTOMY     ovaries removed   ANKLE SURGERY  05/09/2003   right ankle tendon repair   basal cell removed from face     3-4 times   BREAST SURGERY  07/07/1987   lt.breast cancer  intraductal cancer, mastectomy   CATARACT EXTRACTION, BILATERAL  few yrs ago   Bilateral   EYE SURGERY  9-23   FRACTURE SURGERY     HARDWARE REMOVAL  04/05/2012   Procedure: HARDWARE REMOVAL;  Surgeon: Loanne Drilling, MD;  Location:  [facility];  Service: Orthopedics;  Laterality: Right;  Removal of Hardware Right Femur    HARDWARE REMOVAL Left 06/30/2013   Procedure: LEFT HIP HARDWARE REMOVAL OF CABLES;  Surgeon: Loanne Drilling, MD;  Location: [facility];  Service: Orthopedics;  Laterality: Left;   HARDWARE REMOVAL Right 08/21/2013   Procedure: RIGHT HIP HARDWARE REMOVAL;  Surgeon: Loanne Drilling, MD;  Location: [facility];  Service: Orthopedics;  Laterality: Right;   JOINT REPLACEMENT  07/27/2011   Bilateral: Right THA - 04/2000, Left THA 06/2001   left hip replacement Left 06/08/2001   LOOP RECORDER IMPLANT  04/08/2014   MDT LINQ implanted by Dr Ladona Ridgel for cryptogenic stroke   LOOP RECORDER REMOVAL N/A 04/26/2017   Procedure: LOOP RECORDER REMOVAL;  Surgeon: Marinus Maw, MD;  Location: [facility] INVASIVE CV LAB;  Service: Cardiovascular;  Laterality: N/A;   MASTECTOMY  05/09/1987   left - Pt states has no restrictions on use of L arm for BP or blood draw   melanoma removed Left    x 1   ORIF FEMUR FRACTURE  07/31/2011   Procedure: OPEN REDUCTION INTERNAL FIXATION (ORIF) DISTAL FEMUR FRACTURE;  Surgeon: Loanne Drilling, MD;  Location: [facility];  Service: Orthopedics;  Laterality: Right;  right femur  (c-arm)    SQUAMOUS CELL CARCINOMA EXCISION  07/27/2011   x2 left shoulder   TOTAL HIP REVISION  04/10/2012   Procedure: TOTAL HIP REVISION;  Surgeon: Loanne Drilling, MD;  Location: [facility];  Service: Orthopedics;  Laterality: Right;   TOTAL HIP REVISION Left 06/27/2012   Procedure: LEFT TOTAL HIP REVISION;  Surgeon: Loanne Drilling, MD;  Location: [facility];  Service: Orthopedics;  Laterality: Left;   Social History   Occupational History   Occupation: Retired  Tobacco Use   Smoking status: Former     Current packs/day: 0.00    Average packs/day: 1 pack/day for 10.0 years (10.0 ttl pk-yrs)    Types: Cigarettes    Start date: 05/08/1953    Quit date: 05/09/1963    Years since quitting: 60.2    Passive exposure: Never   Smokeless tobacco: Never  Vaping Use   Vaping status: Never Used  Substance and Sexual Activity   Alcohol use: Not Currently    Alcohol/week: 1.0 standard drink of alcohol    Types: 1 Glasses of wine per week    Comment: very little   Drug use: No   Sexual activity: Not Currently

## 2023-07-17 ENCOUNTER — Ambulatory Visit (INDEPENDENT_AMBULATORY_CARE_PROVIDER_SITE_OTHER): Admitting: Rehabilitative and Restorative Service Providers"

## 2023-07-17 ENCOUNTER — Encounter: Payer: Self-pay | Admitting: Rehabilitative and Restorative Service Providers"

## 2023-07-17 DIAGNOSIS — M25641 Stiffness of right hand, not elsewhere classified: Secondary | ICD-10-CM | POA: Diagnosis not present

## 2023-07-17 DIAGNOSIS — M79641 Pain in right hand: Secondary | ICD-10-CM

## 2023-07-17 DIAGNOSIS — R600 Localized edema: Secondary | ICD-10-CM

## 2023-07-17 DIAGNOSIS — M6281 Muscle weakness (generalized): Secondary | ICD-10-CM

## 2023-07-17 NOTE — Therapy (Signed)
 OUTPATIENT OCCUPATIONAL THERAPY TREATMENT NOTE  Patient Name: [name] MRN: [id number] DOB:November 29, 1931, 89 y.o., female Today's Date: 07/17/2023  PCP: Bosie Helper NP REFERRING PROVIDER: Persons, [place], [place]   END OF SESSION:  OT End of Session - 07/17/23 1441     Visit Number 2    Number of Visits 6    Date for OT Re-Evaluation 08/24/23    Authorization Type [employer]    OT Start Time 1441    OT Stop Time 1505    OT Time Calculation (min) 24 min    Equipment Utilized During Treatment --    Activity Tolerance Patient tolerated treatment well;No increased pain;Patient limited by pain    Behavior During Therapy [facility] for tasks assessed/performed              Past Medical History:  Diagnosis Date   Acute CVA (cerebrovascular accident) (HCC) 03/10/2014   Anemia    hx of with surgery    Arthritis 07/27/2011   osteoarthritis-hips/s/p bil. THA, arthritis right hand    Blepharitis of left eye    Blood transfusion 06/2012   post surgery some time ago   Cancer [facility]) 1989   Breast cancer -s/p lt. mastectomy, some squamous cell lesions   Cold hands    Complication of anesthesia    epinephrine - screams uncontrollably / pt does not want general anesthesia or narcotics   Constipation    Dyslipidemia 04/15/2015   Elevated LDL cholesterol level 01/26/2020   Fuchs' corneal dystrophy    both eyes   Hard of hearing    both ears mild loss   Hyperkalemia 03/07/2018   Hypertension    Hypothyroidism 3-21- 13   tx. levothyroxine   Lichen sclerosus et atrophicus of the vulva    pt reported.    Neuromuscular disorder (HCC) 01/2022   Osteopenia    PMR (polymyalgia rheumatica) (HCC) 08/14/2022   Stroke [facility])    a. s/p MDT LINQ 04/2014   Past Surgical History:  Procedure Laterality Date   ABDOMINAL HYSTERECTOMY     ovaries removed   ANKLE SURGERY  05/09/2003   right ankle tendon repair   basal cell removed from face     3-4 times   BREAST SURGERY  07/07/1987   lt.breast cancer  intraductal cancer, mastectomy   CATARACT EXTRACTION, BILATERAL  few yrs ago   Bilateral   EYE SURGERY  9-23   FRACTURE SURGERY     HARDWARE REMOVAL  04/05/2012   Procedure: HARDWARE REMOVAL;  Surgeon: Loanne Drilling, MD;  Location: [facility];  Service: Orthopedics;  Laterality: Right;  Removal of Hardware Right Femur    HARDWARE REMOVAL Left 06/30/2013   Procedure: LEFT HIP HARDWARE REMOVAL OF CABLES;  Surgeon: Loanne Drilling, MD;  Location: [facility];  Service: Orthopedics;  Laterality: Left;   HARDWARE REMOVAL Right 08/21/2013   Procedure: RIGHT HIP HARDWARE REMOVAL;  Surgeon: Loanne Drilling, MD;  Location: [facility];  Service: Orthopedics;  Laterality: Right;   JOINT REPLACEMENT  07/27/2011   Bilateral: Right THA - 04/2000, Left THA 06/2001   left hip replacement Left 06/08/2001   LOOP RECORDER IMPLANT  04/08/2014   MDT LINQ implanted by Dr Ladona Ridgel for cryptogenic stroke   LOOP RECORDER REMOVAL N/A 04/26/2017   Procedure: LOOP RECORDER REMOVAL;  Surgeon: Marinus Maw, MD;  Location: [facility] INVASIVE CV LAB;  Service: Cardiovascular;  Laterality: N/A;   MASTECTOMY  05/09/1987   left - Pt states has no restrictions on  use of L arm for BP or blood draw   melanoma removed Left    x 1   ORIF FEMUR FRACTURE  07/31/2011   Procedure: OPEN REDUCTION INTERNAL FIXATION (ORIF) DISTAL FEMUR FRACTURE;  Surgeon: Loanne Drilling, MD;  Location: [facility];  Service: Orthopedics;  Laterality: Right;  right femur  (c-arm)    SQUAMOUS CELL CARCINOMA EXCISION  07/27/2011   x2 left shoulder   TOTAL HIP REVISION  04/10/2012   Procedure: TOTAL HIP REVISION;  Surgeon: Loanne Drilling, MD;  Location: [facility];  Service: Orthopedics;  Laterality: Right;   TOTAL HIP REVISION Left 06/27/2012   Procedure: LEFT TOTAL HIP REVISION;  Surgeon: Loanne Drilling, MD;  Location: [facility];  Service: Orthopedics;  Laterality: Left;   Patient Active Problem List   Diagnosis Date Noted   Right hand fracture, closed, initial encounter  06/22/2023   Fuchs' corneal dystrophy 01/22/2023   Osteoarthritis of both hands 08/04/2021   Overactive bladder 08/04/2021   Mixed hyperlipidemia 08/03/2021   Estrogen deficiency 01/27/2021   Paroxysmal A-fib (HCC) 10/18/2020   Secondary hypercoagulable state (HCC) 08/26/2020   Osteopenia 03/24/2020   Aortic atherosclerosis (HCC) 01/26/2020   Hyponatremia 11/29/2019   History of loop recorder 11/20/2019   Vitamin D deficiency 01/14/2019   History of CVA (cerebrovascular accident) without residual deficits 01/14/2019   Nocturnal leg cramps 01/14/2019   Blepharitis of left eye    Malignant melanoma (HCC) 11/21/2018   Hyperkalemia 03/07/2018   Atrial fibrillation (HCC) 04/15/2015   Essential hypertension 03/10/2014   Failed total hip arthroplasty (HCC) 06/27/2012   Painful orthopaedic hardware (HCC) 04/05/2012   Hypothyroidism 07/27/2011    ONSET DATE: DOI 06/17/23    REFERRING DIAG: M84.13KG (ICD-10-CM) - Right hand fracture, closed, initial encounter   THERAPY DIAG:  Localized edema  Muscle weakness (generalized)  Pain in right hand  Stiffness of right hand, not elsewhere classified  Rationale for Evaluation and Treatment: Rehabilitation  PERTINENT HISTORY: 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 She comes in today 3 weeks status post fourth and fifth proximal phalanx fractures that have been treated with an ulnar gutter splint. She does say that it is less painful. She admits she has been coming out of the splint a little bit but not using her fourth and fifth fingers  She states not having any significant pain in her hands today, but also that she is on Tylenol and it masks her pain.  She states that she is eager to use her hand to play the violin and do things around the house.  She is wearing a prefabricated wrist and hand brace today which she states is uncomfortable, loose, not working well for her.  She also does not need to have the wrist immobilized at  this point.   PRECAUTIONS: None  RED FLAGS: None   WEIGHT BEARING RESTRICTIONS: Yes nonweightbearing in right hand for at least 3-4 more weeks    SUBJECTIVE:   SUBJECTIVE STATEMENT:  Now 4 weeks post fx. She states she is having some possible new swelling but no pain, has been doing exercises 4 times a day and avoiding lifting anything heavy as asked.   PAIN:  Are you having pain?  No pain at rest today but she also states being medicated  FALLS: Has patient fallen in last 6 months? Yes. Number of falls 1 (this accident-not considered a significant fall risk)  LIVING ENVIRONMENT: Lives with: lives alone Lives in: House/apartment  PLOF: Independent  PATIENT GOALS: To improve use of right dominant hand and eventually return to playing violin  NEXT MD VISIT: 07/27/2023  OBJECTIVE: (All objective assessments below are from initial evaluation on: 07/13/2023 unless otherwise specified.)    HAND DOMINANCE: Right   ADLs: Overall ADLs: States decreased ability to play violin, perform basic ADLs, hiking, applying ointments, cooking cleaning, etc., etc.   FUNCTIONAL OUTCOME MEASURES: Eval: Patient Specific Functional Scale: 6.5 (Play violin, performed BADLs, hike, cooking/cleaning)  (Higher Score  =  Better Ability for the Selected Tasks)     UPPER EXTREMITY ROM     Shoulder to Wrist AROM Right eval  Wrist flexion 58  Wrist extension 50  (Blank rows = not tested)   Hand AROM Right eval Rt 07/17/23  Full Fist Ability (or Gap to Distal Palmar Crease) Unable due to stiffness in the small and ring fingers of the right hand   Thumb Opposition  (Kapandji Scale)  Within functional limits   Thumb MCP (0-60)    Thumb IP (0-80)    Thumb Radial Abduction Span    Thumb Palmar Abduction Span    Index MCP (0-90)    Index PIP (0-100)    Index DIP (0-70)     Long MCP (0-90)     Long PIP (0-100)     Long DIP (0-70)     Ring MCP (0-90) 0 -52    Ring PIP (0-100) 0-  90   Ring  DIP (0-70) 0-  63   Little MCP (0-90) (+5) -  40 (+3) - 53  Little PIP (0-100)  (-10) - 52 0- 37  Little DIP (0-70)  0-  44 (-2)- 41  (Blank rows = not tested)   UPPER EXTREMITY MMT:    Eval:  NT at eval due to recent and still healing injuries. Will be tested when appropriate.   MMT Right TBD  Elbow flexion   Elbow extension   Forearm supination   Forearm pronation   Wrist flexion   Wrist extension   Wrist ulnar deviation   Wrist radial deviation   (Blank rows = not tested)  HAND FUNCTION: Eval: Observed weakness in affected right hand.  Details will be tested when safe Grip strength Right: TBD lbs, Left: TBD lbs   COORDINATION: Eval: Observed coordination impairments with affected right hand.  Details will be tested in approximately 2 weeks Box and Blocks Test: Right: TBD blocks today (TBD is [facility])  SENSATION: Eval:  Light touch intact today  EDEMA:   Eval:  Mildly swollen in right hand    OBSERVATIONS:   07/17/2023: Patient's hand actually looks much better today, not as swollen, IP joints are straightening much better now, they are a little stiffer to bend now which is understandable given the new orthosis.  Eval: Dorsum of MCP joint slightly swollen, no apparent pain with light active range of motion, no significant angulation deformities but small finger PIP joint has a bit of flexion contracture forming.  Middle finger is actually somewhat angulated ulnarly.  Healing right hand P1 fractures of digits 4 and 5   TODAY'S TREATMENT:  07/17/23: She performs active range of motion for new measures as well as review of exercises today which does show better extending of the fingers but some stiffness with flexion.  As she has no pain, and is now 4 weeks post injury, we upgraded to full fist as tolerated active range of motion and increase the frequency from 4 times a day to 6  times a day to help manage stiffness.  She was told to back down from this if she does get new pain  or actual new swelling.  Her orthosis did not need adjusted as it is doing his job very well today.  She states understanding all directions including keeping her hand above her heart when doing exercises for edema reduction as well as a gentle retrograde massage.  She leaves in no pain  She was given the following instructions:  "Because you have been feeling more stiff, but not painful- we can increase the frequency of exercises and the intensity somewhat.   -Now try to make a full fist, as you are able.  (Still don't cause pain.)  -Also remove brace 6x day for home exercises, instead of 4x day.      IF you get new or worse pain OR new big swelling = SLOW DOWN again.     Try to do some of your exercises with your hand held higher above your heart.     Also try light "stoking" massage to push swelling out of your hand."    PATIENT EDUCATION: Education details: See tx section above for details  Person educated: Patient Education method: Verbal Instruction, Teach back, Handouts  Education comprehension: States and demonstrates understanding, Additional Education required    HOME EXERCISE PROGRAM: Access Code: [email or web address] URL: [email or web address] Date: 07/13/2023 Prepared by: Fannie Knee   GOALS: Goals reviewed with patient? Yes   SHORT TERM GOALS: (STG required if POC>30 days) Target Date: 07/27/2023  Pt will obtain protective, custom orthotic. Goal status: 07/13/2023: MET   2.  Pt will demo/state understanding of initial HEP to improve pain levels and prerequisite motion. Goal status: INITIAL   LONG TERM GOALS: Target Date: 08/24/2023  Pt will improve functional ability by decreased impairment per PSFS assessment from 6.5 to 8.5 or better, for better quality of life. Goal status: INITIAL  2.  Pt will improve grip strength in right hand from unsafe to test to at least 25 lbs for functional use at home and in IADLs. Goal status: INITIAL  3.  Pt will  improve A/ROM in right small finger total active motion from 131 degrees to at least 200 degrees, to have functional motion for tasks like reach and grasp.  Goal status: INITIAL  4.  Pt will improve coordination skills in right hand and arm, as seen by within functional limit score on box and blocks/nine-hole peg testing to have increased functional ability to carry out fine motor tasks (fasteners, etc.) and more complex, coordinated IADLs (meal prep, sports, etc.).  Goal status: INITIAL    ASSESSMENT:  CLINICAL IMPRESSION: 07/17/23: Her hand actually looks much better today though it does have some admittable stiffness in flexion, we upgraded and changed her home program to help address this.  Continue on see her back next week  Eval: Patient is a 89 y.o. female who was seen today for occupational therapy evaluation for fractured right hand digits 4 and 5 and subsequent stiffness, weakness, pain and decreased functional ability for which she will benefit from outpatient occupational therapy to safely progress to increase function and quality of life.      PLAN:  OT FREQUENCY: Up to once a week  OT DURATION: 6 weeks through 08/24/2023 and up to 6 total visits as needed  PLANNED INTERVENTIONS: 97168 OT Re-evaluation, 97535 self care/ADL training, 01027 therapeutic exercise, 97530 therapeutic activity, 97112 neuromuscular re-education, 97140 manual therapy, 97035 ultrasound, 97018 paraffin, 25366 fluidotherapy,  97010 moist heat, 97010 cryotherapy, 97760 Orthotics management and training, 86578 Splinting (initial encounter), [phone] Subsequent splinting/medication, coping strategies training, and patient/family education  RECOMMENDED OTHER SERVICES: None now  CONSULTED AND AGREED WITH PLAN OF CARE: Patient  PLAN FOR NEXT SESSION:   Upgrade exercises to light PROM as tolerated between 5 and 6 weeks postinjury.   Fannie Knee, OTR/L, CHT 07/17/2023, 3:17 PM

## 2023-07-25 DIAGNOSIS — L409 Psoriasis, unspecified: Secondary | ICD-10-CM | POA: Diagnosis not present

## 2023-07-25 NOTE — Therapy (Signed)
 OUTPATIENT OCCUPATIONAL THERAPY TREATMENT NOTE  Patient Name: [name] MRN: [id number] DOB:02/12/1932, 89 y.o., female Today's Date: 07/27/2023  PCP: Bosie Helper NP REFERRING PROVIDER: Persons, [place], [place]   END OF SESSION:  OT End of Session - 07/27/23 0959     Visit Number 3    Number of Visits 6    Date for OT Re-Evaluation 08/24/23    Authorization Type [employer]    OT Start Time 1004    OT Stop Time 1044    OT Time Calculation (min) 40 min    Equipment Utilized During Treatment buddy strap    Activity Tolerance Patient tolerated treatment well;No increased pain;Patient limited by pain    Behavior During Therapy [facility] for tasks assessed/performed               Past Medical History:  Diagnosis Date   Acute CVA (cerebrovascular accident) (HCC) 03/10/2014   Anemia    hx of with surgery    Arthritis 07/27/2011   osteoarthritis-hips/s/p bil. THA, arthritis right hand    Blepharitis of left eye    Blood transfusion 06/2012   post surgery some time ago   Cancer [facility]) 1989   Breast cancer -s/p lt. mastectomy, some squamous cell lesions   Cold hands    Complication of anesthesia    epinephrine - screams uncontrollably / pt does not want general anesthesia or narcotics   Constipation    Dyslipidemia 04/15/2015   Elevated LDL cholesterol level 01/26/2020   Fuchs' corneal dystrophy    both eyes   Hard of hearing    both ears mild loss   Hyperkalemia 03/07/2018   Hypertension    Hypothyroidism 3-21- 13   tx. levothyroxine   Lichen sclerosus et atrophicus of the vulva    pt reported.    Neuromuscular disorder (HCC) 01/2022   Osteopenia    PMR (polymyalgia rheumatica) (HCC) 08/14/2022   Stroke [facility])    a. s/p MDT LINQ 04/2014   Past Surgical History:  Procedure Laterality Date   ABDOMINAL HYSTERECTOMY     ovaries removed   ANKLE SURGERY  05/09/2003   right ankle tendon repair   basal cell removed from face     3-4 times   BREAST SURGERY  07/07/1987    lt.breast cancer intraductal cancer, mastectomy   CATARACT EXTRACTION, BILATERAL  few yrs ago   Bilateral   EYE SURGERY  9-23   FRACTURE SURGERY     HARDWARE REMOVAL  04/05/2012   Procedure: HARDWARE REMOVAL;  Surgeon: Loanne Drilling, MD;  [facility]: [facility];  Service: Orthopedics;  Laterality: Right;  Removal of Hardware Right Femur    HARDWARE REMOVAL Left 06/30/2013   Procedure: LEFT HIP HARDWARE REMOVAL OF CABLES;  Surgeon: Loanne Drilling, MD;  [facility];  Service: Orthopedics;  Laterality: Left;   HARDWARE REMOVAL Right 08/21/2013   Procedure: RIGHT HIP HARDWARE REMOVAL;  Surgeon: Loanne Drilling, MD;  [facility]: [facility];  Service: Orthopedics;  Laterality: Right;   JOINT REPLACEMENT  07/27/2011   Bilateral: Right THA - 04/2000, Left THA 06/2001   left hip replacement Left 06/08/2001   LOOP RECORDER IMPLANT  04/08/2014   MDT LINQ implanted by Dr [place] Ridgel for cryptogenic stroke   LOOP RECORDER REMOVAL N/A 04/26/2017   Procedure: LOOP RECORDER REMOVAL;  Surgeon: Marinus Maw, MD;  Location: [facility] INVASIVE CV LAB;  Service: Cardiovascular;  Laterality: N/A;   MASTECTOMY  05/09/1987   left - Pt states has no  restrictions on use of L arm for BP or blood draw   melanoma removed Left    x 1   ORIF FEMUR FRACTURE  07/31/2011   Procedure: OPEN REDUCTION INTERNAL FIXATION (ORIF) DISTAL FEMUR FRACTURE;  Surgeon: Loanne Drilling, MD;  [facility];  Service: Orthopedics;  Laterality: Right;  right femur  (c-arm)    SQUAMOUS CELL CARCINOMA EXCISION  07/27/2011   x2 left shoulder   TOTAL HIP REVISION  04/10/2012   Procedure: TOTAL HIP REVISION;  Surgeon: Loanne Drilling, MD;  [facility];  Service: Orthopedics;  Laterality: Right;   TOTAL HIP REVISION Left 06/27/2012   Procedure: LEFT TOTAL HIP REVISION;  Surgeon: Loanne Drilling, MD;  [facility];  Service: Orthopedics;  Laterality: Left;   Patient Active Problem List   Diagnosis Date Noted   Right hand fracture, sequela  06/22/2023   Fuchs' corneal dystrophy 01/22/2023   Osteoarthritis of both hands 08/04/2021   Overactive bladder 08/04/2021   Mixed hyperlipidemia 08/03/2021   Estrogen deficiency 01/27/2021   Paroxysmal A-fib (HCC) 10/18/2020   Secondary hypercoagulable state (HCC) 08/26/2020   Osteopenia 03/24/2020   Aortic atherosclerosis (HCC) 01/26/2020   Hyponatremia 11/29/2019   History of loop recorder 11/20/2019   Vitamin D deficiency 01/14/2019   History of CVA (cerebrovascular accident) without residual deficits 01/14/2019   Nocturnal leg cramps 01/14/2019   Blepharitis of left eye    Malignant melanoma (HCC) 11/21/2018   Hyperkalemia 03/07/2018   Atrial fibrillation (HCC) 04/15/2015   Essential hypertension 03/10/2014   Failed total hip arthroplasty (HCC) 06/27/2012   Painful orthopaedic hardware (HCC) 04/05/2012   Hypothyroidism 07/27/2011    ONSET DATE: DOI 06/17/23    REFERRING DIAG: N02.72ZD (ICD-10-CM) - Right hand fracture, closed, initial encounter   THERAPY DIAG:  Localized edema  Muscle weakness (generalized)  Stiffness of right hand, not elsewhere classified  Pain in right hand  Rationale for Evaluation and Treatment: Rehabilitation  PERTINENT HISTORY: 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 She comes in today 3 weeks status post fourth and fifth proximal phalanx fractures that have been treated with an ulnar gutter splint. She does say that it is less painful. She admits she has been coming out of the splint a little bit but not using her fourth and fifth fingers  She states not having any significant pain in her hands today, but also that she is on Tylenol and it masks her pain.  She states that she is eager to use her hand to play the violin and do things around the house.  She is wearing a prefabricated wrist and hand brace today which she states is uncomfortable, loose, not working well for her.  She also does not need to have the wrist immobilized at  this point.   PRECAUTIONS: None  RED FLAGS: None   WEIGHT BEARING RESTRICTIONS: Yes nonweightbearing in right hand for at least 3-4 more weeks    SUBJECTIVE:   SUBJECTIVE STATEMENT: Now ~6 weeks post fx. She states seeing the orthopedic provider today who states her x-rays look great, her hand is healed, she does not have to wear her orthosis anymore.  She states no pain significantly now on the past week, but her hand is still somewhat tender at the MCP joints of digits 4 and 5.   PAIN:  Are you having pain?   No pain at rest today but she also states being medicated  FALLS: Has patient fallen in last 6  months? Yes. Number of falls 1 (this accident-not considered a significant fall risk)  LIVING ENVIRONMENT: Lives with: lives alone Lives in: House/apartment  PLOF: Independent  PATIENT GOALS: To improve use of right dominant hand and eventually return to playing violin  NEXT MD VISIT: 07/27/2023  OBJECTIVE: (All objective assessments below are from initial evaluation on: 07/13/2023 unless otherwise specified.)    HAND DOMINANCE: Right   ADLs: Overall ADLs: States decreased ability to play violin, perform basic ADLs, hiking, applying ointments, cooking cleaning, etc., etc.   FUNCTIONAL OUTCOME MEASURES: Eval: Patient Specific Functional Scale: 6.5 (Play violin, performed BADLs, hike, cooking/cleaning)  (Higher Score  =  Better Ability for the Selected Tasks)     UPPER EXTREMITY ROM     Shoulder to Wrist AROM Right eval  Wrist flexion 58  Wrist extension 50  (Blank rows = not tested)   Hand AROM Right eval Rt 07/17/23 Rt 07/27/23  Full Fist Ability (or Gap to Distal Palmar Crease) Unable due to stiffness in the small and ring fingers of the right hand    Thumb Opposition  (Kapandji Scale)  Within functional limits    Thumb MCP (0-60)     Thumb IP (0-80)     Thumb Radial Abduction Span     Thumb Palmar Abduction Span     Index MCP (0-90)     Index PIP (0-100)      Index DIP (0-70)      Long MCP (0-90)      Long PIP (0-100)      Long DIP (0-70)      Ring MCP (0-90) 0 -52   0 - 62  Ring PIP (0-100) 0-  90  0 - 75 (80* with buddy strap on)   Ring DIP (0-70) 0-  63  0 - 53  Little MCP (0-90) (+5) -  40 (+3) - 53 0 - 59  Little PIP (0-100)  (-10) - 52 0- 37 0 - 34 (47 with buddy strap on)   Little DIP (0-70)  0-  44 (-2)- 41 0 - 28  (Blank rows = not tested)   UPPER EXTREMITY MMT:    Eval:  NT at eval due to recent and still healing injuries. Will be tested when appropriate.   MMT Right TBD  Elbow flexion   Elbow extension   Forearm supination   Forearm pronation   Wrist flexion   Wrist extension   Wrist ulnar deviation   Wrist radial deviation   (Blank rows = not tested)  HAND FUNCTION: Eval: Observed weakness in affected right hand.  Details will be tested when safe Grip strength Right: TBD lbs, Left: TBD lbs   COORDINATION: Eval: Observed coordination impairments with affected right hand.  Details will be tested in approximately 2 weeks Box and Blocks Test: Right: TBD blocks today (TBD is [facility])  SENSATION: Eval:  Light touch intact today  EDEMA:   Eval:  Mildly swollen in right hand    OBSERVATIONS:   07/17/2023: Patient's hand actually looks much better today, not as swollen, IP joints are straightening much better now, they are a little stiffer to bend now which is understandable given the new orthosis.  Eval: Dorsum of MCP joint slightly swollen, no apparent pain with light active range of motion, no significant angulation deformities but small finger PIP joint has a bit of flexion contracture forming.  Middle finger is actually somewhat angulated ulnarly.  Healing right hand P1 fractures of digits 4 and  5   TODAY'S TREATMENT:  07/27/23: First off, OT does advise her to use caution and safety as she is only less than 6 weeks post fracture.  She was recommended to wear her orthosis still if needed for any tenderness, pain,  or in any instance that she may possibly fall down or lift up anything heavy.  She was recommended to do no strengthening with her right hand yet, lift nothing more than 5 or 10 pounds or anything that is painful.   Next, OT upgrades her home exercise program to the exercises listed below.  These are gentle stretches at all joints of the fingers in flexion, and also in extension stretch as tolerated.  She should then continue with active range of motion to tension points with tendon glides.  Each of these were done with her multiple times, carefully, and she has some difficulty discerning pain from stretches.  MCP joints remain tender, so OT tells her to not stretch the MCP joint if these things are painful to her.  OT also applies her with a buddy strap today to wear during the day with activities to help encourage motion as long as this is not tender or painful.   She makes comments about going hiking and holding hiking poles, OT does advise against this for at least 2-3 more weeks until we can begin hand strengthening per normal healing protocols.    Exercises - BACK KNUCKLE STRETCHES   - 4 x daily - 3-5 reps - 15 sec hold - HOOK Stretch  - 4 x daily - 3-5 reps - 15-20 sec hold - Seated Finger Composite Flexion Stretch  - 4 x daily - 3-5 reps - 15 hold - PUSH KNUCKLES DOWN  - 4 x daily - 3-5 reps - 15 seconds hold - Tendon Glides  - 4-6 x daily - 3-5 reps - 2-3 seconds hold  PATIENT EDUCATION: Education details: See tx section above for details  Person educated: Patient Education method: Engineer, structural, Teach back, Handouts  Education comprehension: States and demonstrates understanding, Additional Education required    HOME EXERCISE PROGRAM: Access Code: [email or web address] URL: [email or web address] Date: 07/13/2023 Prepared by: Fannie Knee   GOALS: Goals reviewed with patient? Yes   SHORT TERM GOALS: (STG required if POC>30 days) Target Date: 07/27/2023  Pt will  obtain protective, custom orthotic. Goal status: 07/13/2023: MET   2.  Pt will demo/state understanding of initial HEP to improve pain levels and prerequisite motion. Goal status: INITIAL   LONG TERM GOALS: Target Date: 08/24/2023  Pt will improve functional ability by decreased impairment per PSFS assessment from 6.5 to 8.5 or better, for better quality of life. Goal status: INITIAL  2.  Pt will improve grip strength in right hand from unsafe to test to at least 25 lbs for functional use at home and in IADLs. Goal status: INITIAL  3.  Pt will improve A/ROM in right small finger total active motion from 131 degrees to at least 200 degrees, to have functional motion for tasks like reach and grasp.  Goal status: INITIAL  4.  Pt will improve coordination skills in right hand and arm, as seen by within functional limit score on box and blocks/nine-hole peg testing to have increased functional ability to carry out fine motor tasks (fasteners, etc.) and more complex, coordinated IADLs (meal prep, sports, etc.).  Goal status: INITIAL    ASSESSMENT:  CLINICAL IMPRESSION: 07/27/23: She is still having a significant amount of tenderness with stretches at  the MCP joints, so she was encouraged to possibly not discharged the orthosis yet, and to be very careful and gentle with stretches, also to do no weightbearing through the right hand yet more than 5 or 10 pounds.  She was also encouraged to probably not do hiking or any high risk activities that she may fall again.  She should definitely have her orthosis on if she is doing anything like that.  07/17/23: Her hand actually looks much better today though it does have some admittable stiffness in flexion, we upgraded and changed her home program to help address this.  Continue on see her back next week  Eval: Patient is a 89 y.o. female who was seen today for occupational therapy evaluation for fractured right hand digits 4 and 5 and subsequent  stiffness, weakness, pain and decreased functional ability for which she will benefit from outpatient occupational therapy to safely progress to increase function and quality of life.      PLAN:  OT FREQUENCY: Up to once a week  OT DURATION: 6 weeks through 08/24/2023 and up to 6 total visits as needed  PLANNED INTERVENTIONS: 97168 OT Re-evaluation, 97535 self care/ADL training, 62952 therapeutic exercise, 97530 therapeutic activity, 97112 neuromuscular re-education, 97140 manual therapy, 97035 ultrasound, 97018 paraffin, 84132 fluidotherapy, 97010 moist heat, 97010 cryotherapy, 97760 Orthotics management and training, 44010 Splinting (initial encounter), [id number] Subsequent splinting/medication, coping strategies training, and patient/family education  CONSULTED AND AGREED WITH PLAN OF CARE: Patient  PLAN FOR NEXT SESSION:   She is scheduled in 2 weeks at which point we could check range of motion and start adding light strengthening for the hand.   Fannie Knee, OTR/L, CHT 07/27/2023, 10:52 AM

## 2023-07-27 ENCOUNTER — Ambulatory Visit: Admitting: Rehabilitative and Restorative Service Providers"

## 2023-07-27 ENCOUNTER — Ambulatory Visit: Admitting: Physician Assistant

## 2023-07-27 ENCOUNTER — Ambulatory Visit (INDEPENDENT_AMBULATORY_CARE_PROVIDER_SITE_OTHER): Payer: Self-pay

## 2023-07-27 ENCOUNTER — Encounter: Payer: Self-pay | Admitting: Rehabilitative and Restorative Service Providers"

## 2023-07-27 ENCOUNTER — Encounter: Payer: Self-pay | Admitting: Physician Assistant

## 2023-07-27 DIAGNOSIS — S6291XA Unspecified fracture of right wrist and hand, initial encounter for closed fracture: Secondary | ICD-10-CM

## 2023-07-27 DIAGNOSIS — M6281 Muscle weakness (generalized): Secondary | ICD-10-CM

## 2023-07-27 DIAGNOSIS — M79641 Pain in right hand: Secondary | ICD-10-CM

## 2023-07-27 DIAGNOSIS — M25641 Stiffness of right hand, not elsewhere classified: Secondary | ICD-10-CM

## 2023-07-27 DIAGNOSIS — R600 Localized edema: Secondary | ICD-10-CM

## 2023-07-27 DIAGNOSIS — S6291XS Unspecified fracture of right wrist and hand, sequela: Secondary | ICD-10-CM

## 2023-07-27 NOTE — Progress Notes (Signed)
 Office Visit Note   Patient: [name]           Date of Birth: 1931/10/15           MRN: [id number] Visit Date: 07/27/2023              Requested by: Tollie Eth, NP [address] [place],  [place] [address] PCP: Tollie Eth, NP  Chief Complaint  Patient presents with   Right Hand - Follow-up    Follow up right hand she is doing well and only complaint she is unable to make a fist at this time       HPI: [name] is here today 6 weeks status post fourth and fifth proximal phalange fractures.  She has been working with occupational therapy denies any pain but does have some difficulty closing her hand.  Mostly with the ring finger and to a lesser extent the short finger.  Is working with occupational therapy  Assessment & Plan: Visit Diagnoses:  Follow-up right fourth and fifth proximal phalanx fractures  Plan: Reviewed plan with Dr. August Saucer.  She will continue to work with Nate she understands whether she has surgical or nonsurgical treatment may have some stiffness in the hand but still too early to tell what this might be.  She can discontinue the brace.  Will follow-up in 4 weeks nice  Follow-Up Instructions: Return in about 4 weeks (around 08/24/2023).   Ortho Exam  Patient is alert, oriented, no adenopathy, well-dressed, normal affect, normal respiratory effort. Examination of her right hand she still has some swelling and ecchymosis resolving in the ring finger and short finger.  Pulses intact the hand is warm she has decreased flexion of the  more than ring more than the short finger.  Sensation is intact no tenderness over the fractures  Imaging: XR Hand Complete Right Result Date: 07/27/2023 Radiographs of her right hand demonstrate fractures do have good callus and healed.  Reviewed with Dr. August Saucer.  No images are attached to the encounter.  Labs: Lab Results  Component Value Date   HGBA1C 5.5 03/11/2014   ESRSEDRATE 13 06/23/2021   ESRSEDRATE 72 (H)  02/26/2020   ESRSEDRATE 10 08/16/2017   CRP 54 (H) 02/26/2020   REPTSTATUS 11/29/2019 FINAL 11/28/2019   CULT  11/28/2019    NO GROWTH Performed at [facility], [address], [place], [place] [address]      Lab Results  Component Value Date   ALBUMIN 4.5 04/11/2022   ALBUMIN 4.4 01/31/2022   ALBUMIN 4.6 01/27/2021    Lab Results  Component Value Date   MG 2.2 11/01/2020   MG 1.8 10/21/2020   MG 2.1 10/20/2020   Lab Results  Component Value Date   VD25OH 60.4 04/11/2022   VD25OH 57.3 01/27/2021   VD25OH 51.0 01/26/2020    No results found for: "PREALBUMIN"    Latest Ref Rng & Units 04/11/2022    9:25 AM 01/31/2022    8:42 AM 06/23/2021    3:28 PM  CBC EXTENDED  WBC 3.4 - 10.8 x10E3/uL 6.5  6.4  6.6   RBC 3.77 - 5.28 x10E6/uL 3.66  3.73  3.78   Hemoglobin 11.1 - 15.9 g/dL 30.8  65.7  84.6   HCT 34.0 - 46.6 % 35.1  36.3  35.6   Platelets 150 - 450 x10E3/uL 208  232  249   NEUT# 1.4 - 7.0 x10E3/uL 3.9  3.5    Lymph# 0.7 - 3.1  x10E3/uL 1.4  1.8       There is no height or weight on file to calculate BMI.  Orders:  Orders Placed This Encounter  Procedures   XR Hand Complete Right   No orders of the defined types were placed in this encounter.    Procedures: No procedures performed  Clinical Data: No additional findings.  ROS:  All other systems negative, except as noted in the HPI. Review of Systems  Objective: Vital Signs: There were no vitals taken for this visit.  Specialty Comments:  No specialty comments available.  PMFS History: Patient Active Problem List   Diagnosis Date Noted   Right hand fracture, sequela 06/22/2023   Fuchs' corneal dystrophy 01/22/2023   Osteoarthritis of both hands 08/04/2021   Overactive bladder 08/04/2021   Mixed hyperlipidemia 08/03/2021   Estrogen deficiency 01/27/2021   Paroxysmal A-fib (HCC) 10/18/2020   Secondary hypercoagulable state (HCC) 08/26/2020   Osteopenia 03/24/2020   Aortic  atherosclerosis (HCC) 01/26/2020   Hyponatremia 11/29/2019   History of loop recorder 11/20/2019   Vitamin D deficiency 01/14/2019   History of CVA (cerebrovascular accident) without residual deficits 01/14/2019   Nocturnal leg cramps 01/14/2019   Blepharitis of left eye    Malignant melanoma (HCC) 11/21/2018   Hyperkalemia 03/07/2018   Atrial fibrillation (HCC) 04/15/2015   Essential hypertension 03/10/2014   Failed total hip arthroplasty (HCC) 06/27/2012   Painful orthopaedic hardware (HCC) 04/05/2012   Hypothyroidism 07/27/2011   Past Medical History:  Diagnosis Date   Acute CVA (cerebrovascular accident) (HCC) 03/10/2014   Anemia    hx of with surgery    Arthritis 07/27/2011   osteoarthritis-hips/s/p bil. THA, arthritis right hand    Blepharitis of left eye    Blood transfusion 06/2012   post surgery some time ago   Cancer [facility]) 1989   Breast cancer -s/p lt. mastectomy, some squamous cell lesions   Cold hands    Complication of anesthesia    epinephrine - screams uncontrollably / pt does not want general anesthesia or narcotics   Constipation    Dyslipidemia 04/15/2015   Elevated LDL cholesterol level 01/26/2020   Fuchs' corneal dystrophy    both eyes   Hard of hearing    both ears mild loss   Hyperkalemia 03/07/2018   Hypertension    Hypothyroidism 3-21- 13   tx. levothyroxine   Lichen sclerosus et atrophicus of the vulva    pt reported.    Neuromuscular disorder (HCC) 01/2022   Osteopenia    PMR (polymyalgia rheumatica) (HCC) 08/14/2022   Stroke [facility])    a. s/p MDT LINQ 04/2014    Family History  Problem Relation Age of Onset   Hodgkin's lymphoma Mother    Celiac disease Mother    Cancer Mother    Miscarriages / [place] Mother    Melanoma Father    Cancer Father    Cancer Brother    Arthritis Sister    Ulcerative colitis Sister    Depression Sister    Hearing loss Sister    Kidney disease Sister    Miscarriages / Stillbirths Sister    Heart  disease Paternal Grandmother    Heart disease Paternal Grandfather    Vision loss Paternal Grandfather    Hearing loss Maternal Grandmother     Past Surgical History:  Procedure Laterality Date   ABDOMINAL HYSTERECTOMY     ovaries removed   ANKLE SURGERY  05/09/2003   right ankle tendon repair   basal cell removed  from face     3-4 times   BREAST SURGERY  07/07/1987   lt.breast cancer intraductal cancer, mastectomy   CATARACT EXTRACTION, BILATERAL  few yrs ago   Bilateral   EYE SURGERY  9-23   FRACTURE SURGERY     HARDWARE REMOVAL  04/05/2012   Procedure: HARDWARE REMOVAL;  Surgeon: Loanne Drilling, MD;  [facility];  Service: Orthopedics;  Laterality: Right;  Removal of Hardware Right Femur    HARDWARE REMOVAL Left 06/30/2013   Procedure: LEFT HIP HARDWARE REMOVAL OF CABLES;  Surgeon: Loanne Drilling, MD;  [facility];  Service: Orthopedics;  Laterality: Left;   HARDWARE REMOVAL Right 08/21/2013   Procedure: RIGHT HIP HARDWARE REMOVAL;  Surgeon: Loanne Drilling, MD;  [facility];  Service: Orthopedics;  Laterality: Right;   JOINT REPLACEMENT  07/27/2011   Bilateral: Right THA - 04/2000, Left THA 06/2001   left hip replacement Left 06/08/2001   LOOP RECORDER IMPLANT  04/08/2014   MDT LINQ implanted by Dr [place]ona Ridgel for cryptogenic stroke   LOOP RECORDER REMOVAL N/A 04/26/2017   Procedure: LOOP RECORDER REMOVAL;  Surgeon: Marinus Maw, MD;  Location[facility] INVASIVE CV LAB;  Service: Cardiovascular;  Laterality: N/A;   MASTECTOMY  05/09/1987   left - Pt states has no restrictions on use of L arm for BP or blood draw   melanoma removed Left    x 1   ORIF FEMUR FRACTURE  07/31/2011   Procedure: OPEN REDUCTION INTERNAL FIXATION (ORIF) DISTAL FEMUR FRACTURE;  Surgeon: Loanne Drilling, MD;  Location[facility];  Service: Orthopedics;  Laterality: Right;  right femur  (c-arm)    SQUAMOUS CELL CARCINOMA EXCISION  07/27/2011   x2 left shoulder   TOTAL HIP REVISION  04/10/2012    Procedure: TOTAL HIP REVISION;  Surgeon: Loanne Drilling, MD;  [facility];  Service: Orthopedics;  Laterality: Right;   TOTAL HIP REVISION Left 06/27/2012   Procedure: LEFT TOTAL HIP REVISION;  Surgeon: Loanne Drilling, MD;  Location[facility];  Service: Orthopedics;  Laterality: Left;   Social History   Occupational History   Occupation: Retired  Tobacco Use   Smoking status: Former    Current packs/day: 0.00    Average packs/day: 1 pack/day for 10.0 years (10.0 ttl pk-yrs)    Types: Cigarettes    Start date: 05/08/1953    Quit date: 05/09/1963    Years since quitting: 60.2    Passive exposure: Never   Smokeless tobacco: Never  Vaping Use   Vaping status: Never Used  Substance and Sexual Activity   Alcohol use: Not Currently    Alcohol/week: 1.0 standard drink of alcohol    Types: 1 Glasses of wine per week    Comment: very little   Drug use: No   Sexual activity: Not Currently

## 2023-08-02 ENCOUNTER — Telehealth: Payer: Self-pay | Admitting: Rehabilitative and Restorative Service Providers"

## 2023-08-02 NOTE — Telephone Encounter (Signed)
 Called patient back per her request, left her a voicemail which reminded her that she is not to start strengthening for another week anything that is significant or heavy or repetitive.  She is scheduled to see me at that time in which I will educate her on those things.  In the meantime, she should be doing light functional activities and focusing on stretching and regaining her full range of motion.

## 2023-08-09 NOTE — Therapy (Signed)
 OUTPATIENT OCCUPATIONAL THERAPY TREATMENT NOTE  Patient Name: [name] MRN: [id number] DOB:19-Nov-1931, 89 y.o., female Today's Date: 08/09/2023  PCP: Bosie Helper NP REFERRING PROVIDER: Persons, [place], [place]   END OF SESSION:      Past Medical History:  Diagnosis Date   Acute CVA (cerebrovascular accident) (HCC) 03/10/2014   Anemia    hx of with surgery    Arthritis 07/27/2011   osteoarthritis-hips/s/p bil. THA, arthritis right hand    Blepharitis of left eye    Blood transfusion 06/2012   post surgery some time ago   Cancer [facility]) 1989   Breast cancer -s/p lt. mastectomy, some squamous cell lesions   Cold hands    Complication of anesthesia    epinephrine - screams uncontrollably / pt does not want general anesthesia or narcotics   Constipation    Dyslipidemia 04/15/2015   Elevated LDL cholesterol level 01/26/2020   Fuchs' corneal dystrophy    both eyes   Hard of hearing    both ears mild loss   Hyperkalemia 03/07/2018   Hypertension    Hypothyroidism 3-21- 13   tx. levothyroxine   Lichen sclerosus et atrophicus of the vulva    pt reported.    Neuromuscular disorder (HCC) 01/2022   Osteopenia    PMR (polymyalgia rheumatica) (HCC) 08/14/2022   Stroke [facility])    a. s/p MDT LINQ 04/2014   Past Surgical History:  Procedure Laterality Date   ABDOMINAL HYSTERECTOMY     ovaries removed   ANKLE SURGERY  05/09/2003   right ankle tendon repair   basal cell removed from face     3-4 times   BREAST SURGERY  07/07/1987   lt.breast cancer intraductal cancer, mastectomy   CATARACT EXTRACTION, BILATERAL  few yrs ago   Bilateral   EYE SURGERY  9-23   FRACTURE SURGERY     HARDWARE REMOVAL  04/05/2012   Procedure: HARDWARE REMOVAL;  Surgeon: Loanne Drilling, MD;  Location: [facility];  Service: Orthopedics;  Laterality: Right;  Removal of Hardware Right Femur    HARDWARE REMOVAL Left 06/30/2013   Procedure: LEFT HIP HARDWARE REMOVAL OF CABLES;  Surgeon: Loanne Drilling, MD;   [facility];  Service: Orthopedics;  Laterality: Left;   HARDWARE REMOVAL Right 08/21/2013   Procedure: RIGHT HIP HARDWARE REMOVAL;  Surgeon: Loanne Drilling, MD;  [facility];  Service: Orthopedics;  Laterality: Right;   JOINT REPLACEMENT  07/27/2011   Bilateral: Right THA - 04/2000, Left THA 06/2001   left hip replacement Left 06/08/2001   LOOP RECORDER IMPLANT  04/08/2014   MDT LINQ implanted by Dr Ladona Ridgel for cryptogenic stroke   LOOP RECORDER REMOVAL N/A 04/26/2017   Procedure: LOOP RECORDER REMOVAL;  Surgeon: Marinus Maw, MD;  Location: [facility] INVASIVE CV LAB;  Service: Cardiovascular;  Laterality: N/A;   MASTECTOMY  05/09/1987   left - Pt states has no restrictions on use of L arm for BP or blood draw   melanoma removed Left    x 1   ORIF FEMUR FRACTURE  07/31/2011   Procedure: OPEN REDUCTION INTERNAL FIXATION (ORIF) DISTAL FEMUR FRACTURE;  Surgeon: Loanne Drilling, MD;  [facility];  Service: Orthopedics;  Laterality: Right;  right femur  (c-arm)    SQUAMOUS CELL CARCINOMA EXCISION  07/27/2011   x2 left shoulder   TOTAL HIP REVISION  04/10/2012   Procedure: TOTAL HIP REVISION;  Surgeon: Loanne Drilling, MD;  [facility];  Service: Orthopedics;  Laterality: Right;   TOTAL HIP REVISION Left 06/27/2012   Procedure: LEFT TOTAL HIP REVISION;  Surgeon: Loanne Drilling, MD;  [facility];  Service: Orthopedics;  Laterality: Left;   Patient Active Problem List   Diagnosis Date Noted   Right hand fracture, sequela 06/22/2023   Fuchs' corneal dystrophy 01/22/2023   Osteoarthritis of both hands 08/04/2021   Overactive bladder 08/04/2021   Mixed hyperlipidemia 08/03/2021   Estrogen deficiency 01/27/2021   Paroxysmal A-fib (HCC) 10/18/2020   Secondary hypercoagulable state (HCC) 08/26/2020   Osteopenia 03/24/2020   Aortic atherosclerosis (HCC) 01/26/2020   Hyponatremia 11/29/2019   History of loop recorder 11/20/2019   Vitamin D deficiency 01/14/2019   History  of CVA (cerebrovascular accident) without residual deficits 01/14/2019   Nocturnal leg cramps 01/14/2019   Blepharitis of left eye    Malignant melanoma (HCC) 11/21/2018   Hyperkalemia 03/07/2018   Atrial fibrillation (HCC) 04/15/2015   Essential hypertension 03/10/2014   Failed total hip arthroplasty (HCC) 06/27/2012   Painful orthopaedic hardware (HCC) 04/05/2012   Hypothyroidism 07/27/2011    ONSET DATE: DOI 06/17/23    REFERRING DIAG: V40.98JX (ICD-10-CM) - Right hand fracture, closed, initial encounter   THERAPY DIAG:  No diagnosis found.  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 She comes in today 3 weeks status post fourth and fifth proximal phalanx fractures that have been treated with an ulnar gutter splint. She does say that it is less painful. She admits she has been coming out of the splint a little bit but not using her fourth and fifth fingers  She states not having any significant pain in her hands today, but also that she is on Tylenol and it masks her pain.  She states that she is eager to use her hand to play the violin and do things around the house.  She is wearing a prefabricated wrist and hand brace today which she states is uncomfortable, loose, not working well for her.  She also does not need to have the wrist immobilized at this point.   PRECAUTIONS: None  RED FLAGS: None   WEIGHT BEARING RESTRICTIONS: Yes nonweightbearing in right hand for at least 3-4 more weeks    SUBJECTIVE:   SUBJECTIVE STATEMENT: Now ~8 weeks post fx. She states ***     PAIN:  Are you having pain?  ***  No pain at rest today but she also states being medicated  FALLS: Has patient fallen in last 6 months? Yes. Number of falls 1 (this accident-not considered a significant fall risk)  LIVING ENVIRONMENT: Lives with: lives alone Lives in: House/apartment  PLOF: Independent  PATIENT  GOALS: To improve use of right dominant hand and eventually return to playing violin  NEXT MD VISIT: 07/27/2023  OBJECTIVE: (All objective assessments below are from initial evaluation on: 07/13/2023 unless otherwise specified.)    HAND DOMINANCE: Right   ADLs: Overall ADLs: States decreased ability to play violin, perform basic ADLs, hiking, applying ointments, cooking cleaning, etc., etc.   FUNCTIONAL OUTCOME MEASURES: Eval: Patient Specific Functional Scale: 6.5 (Play violin, performed BADLs, hike, cooking/cleaning)  (Higher Score  =  Better Ability for the Selected Tasks)     UPPER EXTREMITY ROM     Shoulder to Wrist AROM Right eval  Wrist flexion 58  Wrist extension 50  (Blank rows = not tested)   Hand AROM Right eval Rt 07/17/23 Rt 07/27/23 Rt 08/10/23  Full Fist  Ability (or Gap to Distal Palmar Crease) Unable due to stiffness in the small and ring fingers of the right hand     Thumb Opposition  (Kapandji Scale)  Within functional limits     Thumb MCP (0-60)      Thumb IP (0-80)      Thumb Radial Abduction Span      Thumb Palmar Abduction Span      Index MCP (0-90)      Index PIP (0-100)      Index DIP (0-70)       Long MCP (0-90)       Long PIP (0-100)       Long DIP (0-70)       Ring MCP (0-90) 0 -52   0 - 62 0 - ***  Ring PIP (0-100) 0-  90  0 - 75 (80* with buddy strap on)  0 - ***  Ring DIP (0-70) 0-  63  0 - 53 0 - ***  Little MCP (0-90) (+5) -  40 (+3) - 53 0 - 59 0 - ***  Little PIP (0-100)  (-10) - 52 0- 37 0 - 34 (47 with buddy strap on)  0 - ***  Little DIP (0-70)  0-  44 (-2)- 41 0 - 28 0 - ***  (Blank rows = not tested)   UPPER EXTREMITY MMT:    Eval:  NT at eval due to recent and still healing injuries. Will be tested when appropriate.   MMT Right TBD  Elbow flexion   Elbow extension   Forearm supination   Forearm pronation   Wrist flexion   Wrist extension   Wrist ulnar deviation   Wrist radial deviation   (Blank rows = not  tested)  HAND FUNCTION: 08/10/23: Grip strength Right: *** lbs, Left: TBD lbs     COORDINATION: Eval: Observed coordination impairments with affected right hand.  Details will be tested in approximately 2 weeks Box and Blocks Test: Right: TBD blocks today (TBD is [facility])   OBSERVATIONS:   07/17/2023: Patient's hand actually looks much better today, not as swollen, IP joints are straightening much better now, they are a little stiffer to bend now which is understandable given the new orthosis.  Eval: Dorsum of MCP joint slightly swollen, no apparent pain with light active range of motion, no significant angulation deformities but small finger PIP joint has a bit of flexion contracture forming.  Middle finger is actually somewhat angulated ulnarly.  Healing right hand P1 fractures of digits 4 and 5   TODAY'S TREATMENT:  08/10/23: *** She is scheduled in 2 weeks at which point we could check range of motion and start adding light strengthening for the hand.   07/27/23: First off, OT does advise her to use caution and safety as she is only less than 6 weeks post fracture.  She was recommended to wear her orthosis still if needed for any tenderness, pain, or in any instance that she may possibly fall down or lift up anything heavy.  She was recommended to do no strengthening with her right hand yet, lift nothing more than 5 or 10 pounds or anything that is painful.   Next, OT upgrades her home exercise program to the exercises listed below.  These are gentle stretches at all joints of the fingers in flexion, and also in extension stretch as tolerated.  She should then continue with active range of motion to tension points with tendon glides.  Each  of these were done with her multiple times, carefully, and she has some difficulty discerning pain from stretches.  MCP joints remain tender, so OT tells her to not stretch the MCP joint if these things are painful to her.  OT also applies her with a buddy strap  today to wear during the day with activities to help encourage motion as long as this is not tender or painful.   She makes comments about going hiking and holding hiking poles, OT does advise against this for at least 2-3 more weeks until we can begin hand strengthening per normal healing protocols.    Exercises - BACK KNUCKLE STRETCHES   - 4 x daily - 3-5 reps - 15 sec hold - HOOK Stretch  - 4 x daily - 3-5 reps - 15-20 sec hold - Seated Finger Composite Flexion Stretch  - 4 x daily - 3-5 reps - 15 hold - PUSH KNUCKLES DOWN  - 4 x daily - 3-5 reps - 15 seconds hold - Tendon Glides  - 4-6 x daily - 3-5 reps - 2-3 seconds hold  PATIENT EDUCATION: Education details: See tx section above for details  Person educated: Patient Education method: Engineer, structural, Teach back, Handouts  Education comprehension: States and demonstrates understanding, Additional Education required    HOME EXERCISE PROGRAM: Access Code: [email or web address] URL: [email or web address] Date: 07/13/2023 Prepared by: Fannie Knee   GOALS: Goals reviewed with patient? Yes   SHORT TERM GOALS: (STG required if POC>30 days) Target Date: 07/27/2023  Pt will obtain protective, custom orthotic. Goal status: 07/13/2023: MET   2.  Pt will demo/state understanding of initial HEP to improve pain levels and prerequisite motion. Goal status: INITIAL   LONG TERM GOALS: Target Date: 08/24/2023  Pt will improve functional ability by decreased impairment per PSFS assessment from 6.5 to 8.5 or better, for better quality of life. Goal status: INITIAL  2.  Pt will improve grip strength in right hand from unsafe to test to at least 25 lbs for functional use at home and in IADLs. Goal status: INITIAL  3.  Pt will improve A/ROM in right small finger total active motion from 131 degrees to at least 200 degrees, to have functional motion for tasks like reach and grasp.  Goal status: INITIAL  4.  Pt will improve  coordination skills in right hand and arm, as seen by within functional limit score on box and blocks/nine-hole peg testing to have increased functional ability to carry out fine motor tasks (fasteners, etc.) and more complex, coordinated IADLs (meal prep, sports, etc.).  Goal status: INITIAL    ASSESSMENT:  CLINICAL IMPRESSION: 08/10/23: ***   07/27/23: She is still having a significant amount of tenderness with stretches at the MCP joints, so she was encouraged to possibly not discharged the orthosis yet, and to be very careful and gentle with stretches, also to do no weightbearing through the right hand yet more than 5 or 10 pounds.  She was also encouraged to probably not do hiking or any high risk activities that she may fall again.  She should definitely have her orthosis on if she is doing anything like that.   PLAN:  OT FREQUENCY: Up to once a week  OT DURATION: 6 weeks through 08/24/2023 and up to 6 total visits as needed  PLANNED INTERVENTIONS: 97168 OT Re-evaluation, 97535 self care/ADL training, 14782 therapeutic exercise, 97530 therapeutic activity, 97112 neuromuscular re-education, 97140 manual therapy, 97035 ultrasound, 97018 paraffin, 95621 fluidotherapy, 97010 moist heat, 97010  cryotherapy, [phone] Orthotics management and training, 95284 Splinting (initial encounter), [phone] Subsequent splinting/medication, coping strategies training, and patient/family education  CONSULTED AND AGREED WITH PLAN OF CARE: Patient  PLAN FOR NEXT SESSION:   ***  Fannie Knee, OTR/L, CHT 08/09/2023, 8:23 AM

## 2023-08-10 ENCOUNTER — Ambulatory Visit: Admitting: Rehabilitative and Restorative Service Providers"

## 2023-08-10 ENCOUNTER — Encounter: Payer: Self-pay | Admitting: Rehabilitative and Restorative Service Providers"

## 2023-08-10 DIAGNOSIS — M6281 Muscle weakness (generalized): Secondary | ICD-10-CM

## 2023-08-10 DIAGNOSIS — M79641 Pain in right hand: Secondary | ICD-10-CM | POA: Diagnosis not present

## 2023-08-10 DIAGNOSIS — R600 Localized edema: Secondary | ICD-10-CM

## 2023-08-10 DIAGNOSIS — M25641 Stiffness of right hand, not elsewhere classified: Secondary | ICD-10-CM

## 2023-08-11 ENCOUNTER — Encounter: Payer: Self-pay | Admitting: Internal Medicine

## 2023-08-23 ENCOUNTER — Ambulatory Visit: Admitting: Physician Assistant

## 2023-08-23 ENCOUNTER — Encounter: Payer: Self-pay | Admitting: Physician Assistant

## 2023-08-23 DIAGNOSIS — S6291XS Unspecified fracture of right wrist and hand, sequela: Secondary | ICD-10-CM

## 2023-08-23 NOTE — Progress Notes (Signed)
 Office Visit Note   Patient: [name]           Date of Birth: May 31, 1931           MRN: [id number] Visit Date: 08/23/2023              Requested by: Tollie Eth, NP [address] [place],  [place] [address] PCP: Tollie Eth, NP  No chief complaint on file.     HPI: Zarayah is a pleasant 89 year old woman who is little over 2 months status post fracture to the proximal 4th and 5th phalanxes of her right hand.  She has been working with physical therapy and has completed hand therapy.  Overall she is doing well she feels like she still just does not have good grip strength.  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  Continue the home exercises I think I did talk to Dr. Fara Boros about her and given her some degenerative changes in her hand she may always have a little bit more stiffness certainly if she would like to visit with him show let me know  Follow-Up Instructions: Return if symptoms worsen or fail to improve.   Ortho Exam  Patient is alert, oriented, no adenopathy, well-dressed, normal affect, normal respiratory effort. Right hand she still has some minor soft tissue swelling she has full extension she does have slightly less motion with flexion of the ring finger and small finger.  No tenderness to palpation she has brisk capillary refill  Imaging: No results found. No images are attached to the encounter.  Labs: Lab Results  Component Value Date   HGBA1C 5.5 03/11/2014   ESRSEDRATE 13 06/23/2021   ESRSEDRATE 72 (H) 02/26/2020   ESRSEDRATE 10 08/16/2017   CRP 54 (H) 02/26/2020   REPTSTATUS 11/29/2019 FINAL 11/28/2019   CULT  11/28/2019    NO GROWTH Performed at [facility], [address], [place], [place] [address]      Lab Results  Component Value Date    ALBUMIN 4.5 04/11/2022   ALBUMIN 4.4 01/31/2022   ALBUMIN 4.6 01/27/2021    Lab Results  Component Value Date   MG 2.2 11/01/2020   MG 1.8 10/21/2020   MG 2.1 10/20/2020   Lab Results  Component Value Date   VD25OH 60.4 04/11/2022   VD25OH 57.3 01/27/2021   VD25OH 51.0 01/26/2020    No results found for: "PREALBUMIN"    Latest Ref Rng & Units 04/11/2022    9:25 AM 01/31/2022    8:42 AM 06/23/2021    3:28 PM  CBC EXTENDED  WBC 3.4 - 10.8 x10E3/uL 6.5  6.4  6.6   RBC 3.77 - 5.28 x10E6/uL 3.66  3.73  3.78   Hemoglobin 11.1 - 15.9 g/dL 66.4  40.3  47.4   HCT 34.0 - 46.6 % 35.1  36.3  35.6   Platelets 150 - 450 x10E3/uL 208  232  249   NEUT# 1.4 - 7.0 x10E3/uL 3.9  3.5    Lymph# 0.7 - 3.1 x10E3/uL 1.4  1.8       There is no height or weight on file to calculate BMI.  Orders:  No orders of the defined types were placed in this encounter.  No orders of the defined types were placed  in this encounter.    Procedures: No procedures performed  Clinical Data: No additional findings.  ROS:  All other systems negative, except as noted in the HPI. Review of Systems  Objective: Vital Signs: There were no vitals taken for this visit.  Specialty Comments:  No specialty comments available.  PMFS History: Patient Active Problem List   Diagnosis Date Noted   Right hand fracture, sequela 06/22/2023   Fuchs' corneal dystrophy 01/22/2023   Osteoarthritis of both hands 08/04/2021   Overactive bladder 08/04/2021   Mixed hyperlipidemia 08/03/2021   Estrogen deficiency 01/27/2021   Paroxysmal A-fib (HCC) 10/18/2020   Secondary hypercoagulable state (HCC) 08/26/2020   Osteopenia 03/24/2020   Aortic atherosclerosis (HCC) 01/26/2020   Hyponatremia 11/29/2019   History of loop recorder 11/20/2019   Vitamin D deficiency 01/14/2019   History of CVA (cerebrovascular accident) without residual deficits 01/14/2019   Nocturnal leg cramps 01/14/2019   Blepharitis of left eye     Malignant melanoma (HCC) 11/21/2018   Hyperkalemia 03/07/2018   Atrial fibrillation (HCC) 04/15/2015   Essential hypertension 03/10/2014   Failed total hip arthroplasty (HCC) 06/27/2012   Painful orthopaedic hardware (HCC) 04/05/2012   Hypothyroidism 07/27/2011   Past Medical History:  Diagnosis Date   Acute CVA (cerebrovascular accident) (HCC) 03/10/2014   Anemia    hx of with surgery    Arthritis 07/27/2011   osteoarthritis-hips/s/p bil. THA, arthritis right hand    Blepharitis of left eye    Blood transfusion 06/2012   post surgery some time ago   Cancer [facility]) 1989   Breast cancer -s/p lt. mastectomy, some squamous cell lesions   Cold hands    Complication of anesthesia    epinephrine - screams uncontrollably / pt does not want general anesthesia or narcotics   Constipation    Dyslipidemia 04/15/2015   Elevated LDL cholesterol level 01/26/2020   Fuchs' corneal dystrophy    both eyes   Hard of hearing    both ears mild loss   Hyperkalemia 03/07/2018   Hypertension    Hypothyroidism 3-21- 13   tx. levothyroxine   Lichen sclerosus et atrophicus of the vulva    pt reported.    Neuromuscular disorder (HCC) 01/2022   Osteopenia    PMR (polymyalgia rheumatica) (HCC) 08/14/2022   Stroke [facility])    a. s/p MDT LINQ 04/2014    Family History  Problem Relation Age of Onset   Hodgkin's lymphoma Mother    Celiac disease Mother    Cancer Mother    Miscarriages / [place] Mother    Melanoma Father    Cancer Father    Cancer Brother    Arthritis Sister    Ulcerative colitis Sister    Depression Sister    Hearing loss Sister    Kidney disease Sister    Miscarriages / Stillbirths Sister    Heart disease Paternal Grandmother    Heart disease Paternal Grandfather    Vision loss Paternal Grandfather    Hearing loss Maternal Grandmother     Past Surgical History:  Procedure Laterality Date   ABDOMINAL HYSTERECTOMY     ovaries removed   ANKLE SURGERY  05/09/2003   right  ankle tendon repair   basal cell removed from face     3-4 times   BREAST SURGERY  07/07/1987   lt.breast cancer intraductal cancer, mastectomy   CATARACT EXTRACTION, BILATERAL  few yrs ago   Bilateral   EYE SURGERY  9-23   FRACTURE SURGERY  HARDWARE REMOVAL  04/05/2012   Procedure: HARDWARE REMOVAL;  Surgeon: Aurther Blue, MD;  [facility];  Service: Orthopedics;  Laterality: Right;  Removal of Hardware Right Femur    HARDWARE REMOVAL Left 06/30/2013   Procedure: LEFT HIP HARDWARE REMOVAL OF CABLES;  Surgeon: Aurther Blue, MD;  Location[facility];  Service: Orthopedics;  Laterality: Left;   HARDWARE REMOVAL Right 08/21/2013   Procedure: RIGHT HIP HARDWARE REMOVAL;  Surgeon: Aurther Blue, MD;  [facility];  Service: Orthopedics;  Laterality: Right;   JOINT REPLACEMENT  07/27/2011   Bilateral: Right THA - 04/2000, Left THA 06/2001   left hip replacement Left 06/08/2001   LOOP RECORDER IMPLANT  04/08/2014   MDT LINQ implanted by Dr Carolynne Citron for cryptogenic stroke   LOOP RECORDER REMOVAL N/A 04/26/2017   Procedure: LOOP RECORDER REMOVAL;  Surgeon: Tammie Fall, MD;  Location: [facility] INVASIVE CV LAB;  Service: Cardiovascular;  Laterality: N/A;   MASTECTOMY  05/09/1987   left - Pt states has no restrictions on use of L arm for BP or blood draw   melanoma removed Left    x 1   ORIF FEMUR FRACTURE  07/31/2011   Procedure: OPEN REDUCTION INTERNAL FIXATION (ORIF) DISTAL FEMUR FRACTURE;  Surgeon: Aurther Blue, MD;  Location: [facility];  Service: Orthopedics;  Laterality: Right;  right femur  (c-arm)    SQUAMOUS CELL CARCINOMA EXCISION  07/27/2011   x2 left shoulder   TOTAL HIP REVISION  04/10/2012   Procedure: TOTAL HIP REVISION;  Surgeon: Aurther Blue, MD;  Location: [facility];  Service: Orthopedics;  Laterality: Right;   TOTAL HIP REVISION Left 06/27/2012   Procedure: LEFT TOTAL HIP REVISION;  Surgeon: Aurther Blue, MD;  Location: [facility];  Service: Orthopedics;   Laterality: Left;   Social History   Occupational History   Occupation: Retired  Tobacco Use   Smoking status: Former    Current packs/day: 0.00    Average packs/day: 1 pack/day for 10.0 years (10.0 ttl pk-yrs)    Types: Cigarettes    Start date: 05/08/1953    Quit date: 05/09/1963    Years since quitting: 60.3    Passive exposure: Never   Smokeless tobacco: Never  Vaping Use   Vaping status: Never Used  Substance and Sexual Activity   Alcohol use: Not Currently    Alcohol/week: 1.0 standard drink of alcohol    Types: 1 Glasses of wine per week    Comment: very little   Drug use: No   Sexual activity: Not Currently

## 2023-09-04 ENCOUNTER — Encounter: Payer: Self-pay | Admitting: Nurse Practitioner

## 2023-09-12 DIAGNOSIS — M79606 Pain in leg, unspecified: Secondary | ICD-10-CM | POA: Diagnosis not present

## 2023-09-12 DIAGNOSIS — M25519 Pain in unspecified shoulder: Secondary | ICD-10-CM | POA: Diagnosis not present

## 2023-09-12 DIAGNOSIS — M6281 Muscle weakness (generalized): Secondary | ICD-10-CM | POA: Diagnosis not present

## 2023-09-18 ENCOUNTER — Other Ambulatory Visit: Payer: Self-pay

## 2023-09-18 ENCOUNTER — Other Ambulatory Visit: Payer: Self-pay | Admitting: Physician Assistant

## 2023-09-18 MED ORDER — LOSARTAN POTASSIUM 50 MG PO TABS: 50.0000 mg | ORAL_TABLET | Freq: Every day | ORAL | 0 refills | Status: DC

## 2023-09-18 NOTE — Telephone Encounter (Signed)
 Refill for Losartan  sent to preferred pharmacy. Enough supplied to get pt to her appointment with Renee U.

## 2023-09-28 ENCOUNTER — Ambulatory Visit: Payer: Self-pay | Attending: Physician Assistant | Admitting: Physician Assistant

## 2023-09-28 ENCOUNTER — Encounter: Payer: Self-pay | Admitting: Physician Assistant

## 2023-09-28 VITALS — BP 174/86 | HR 58 | Ht 64.0 in | Wt 121.8 lb

## 2023-09-28 DIAGNOSIS — D6869 Other thrombophilia: Secondary | ICD-10-CM | POA: Diagnosis not present

## 2023-09-28 DIAGNOSIS — I4819 Other persistent atrial fibrillation: Secondary | ICD-10-CM

## 2023-09-28 DIAGNOSIS — Z5181 Encounter for therapeutic drug level monitoring: Secondary | ICD-10-CM | POA: Diagnosis not present

## 2023-09-28 DIAGNOSIS — Z79899 Other long term (current) drug therapy: Secondary | ICD-10-CM

## 2023-09-28 DIAGNOSIS — I10 Essential (primary) hypertension: Secondary | ICD-10-CM | POA: Diagnosis not present

## 2023-09-28 MED ORDER — LOSARTAN POTASSIUM 50 MG PO TABS: 100.0000 mg | ORAL_TABLET | Freq: Every day | ORAL | 1 refills | Status: DC

## 2023-09-28 MED ORDER — LOSARTAN POTASSIUM 50 MG PO TABS: 50.0000 mg | ORAL_TABLET | Freq: Every day | ORAL | 1 refills | Status: DC

## 2023-09-28 NOTE — Patient Instructions (Addendum)
 Medication Instructions:   START TAKING : LOSARTAN  100 MG ONCE A DAY   TAKE  2  ( 50 MG TABLETS )  *If you need a refill on your cardiac medications before your next appointment, please call your pharmacy*  Lab Work:  PLEASE GO DOWN STAIRS  [facility]  FIRST FLOOR   ( GET OFF ELEVATORS WALK TOWARDS WAITING AREA LAB LOCATED BY PHARMACY): BMET MAG AND CBC TODAY    If you have labs (blood work) drawn today and your tests are completely normal, you will receive your results only by: MyChart Message (if you have MyChart) OR A paper copy in the mail If you have any lab test that is abnormal or we need to change your treatment, we will call you to review the results.  Testing/Procedures: NONE ORDERED  TODAY    Follow-Up: At [facility], you and your health needs are our priority.  As part of our continuing mission to provide you with exceptional heart care, our providers are all part of one team.  This team includes your primary Cardiologist (physician) and Advanced Practice Providers or APPs (Physician Assistants and Nurse Practitioners) who all work together to provide you with the care you need, when you need it.  Your next appointment:   6 month(s)  Provider:   Mertha Abrahams, PA-C    We recommend signing up for the patient portal called "MyChart".  Sign up information is provided on this After Visit Summary.  MyChart is used to connect with patients for Virtual Visits (Telemedicine).  Patients are able to view lab/test results, encounter notes, upcoming appointments, etc.  Non-urgent messages can be sent to your provider as well.   To learn more about what you can do with MyChart, go to [email or web address].   Other Instructions

## 2023-09-28 NOTE — Progress Notes (Signed)
 Cardiology Office Note:  .   Date:  09/28/2023  ID:  [name], DOB May 17, 1931, MRN [id number] PCP: Annella Kief, NP  [facility] Providers Cardiologist:  None Cardiology APP:  Debbie Fails, PA-C {  History of Present Illness: [name]   [name] is a 89 y.o. female w/PMHx of  Breast Ca (L mastectomy) Stroke, HTN, HLD, hypothyroidism, PMR AFib  She saw Dr. Rodolfo Clan  04/04/23, report fatigue and leg heaviness, pt felt 2/2 amlodipine  Amlodipine  stopped, losartan  started and deferred further management of her BP to them. Reported ~ 6hrs of AFib Tikosyn  continued  Today's visit is scheduled as a Tikosyn  visit ROS:   She feels great! Exercises every morning  Does not think she has had AFib since she has been on Tikosyn  No CP, SOB, DOE No near syncope or syncope No bleeding or signs of bleeding  She is struggling with psoriasis, asks about Otezla and her Tikosyn   Her BP at home is like today's, generally in 160's-170's  Arrhythmia/AAD hx Tikosyn  started June 2022 Notes reports hx of asymptomatic post termination pauses  Studies Reviewed: [name]    EKG done today and reviewed by myself:  SB 58bpm, PACs, QTc  Echo 07/29/20 1. Left ventricular ejection fraction, by estimation, is 60 to 65%. The  left ventricle has normal function. The left ventricle has no regional  wall motion abnormalities. The average left ventricular global  longitudinal strain is -17.1 % (appears  underestimated). The global longitudinal strain is probably low normal.  Left ventricular diastolic parameters are consistent with Grade II  diastolic dysfunction (pseudonormalization). Elevated left atrial  pressure.   2. Right ventricular systolic function is normal. The right ventricular  size is normal. There is mildly elevated pulmonary artery systolic  pressure. The estimated right ventricular systolic pressure is 39.1 mmHg.   3. Left atrial size was moderately dilated.   4. The  mitral valve is myxomatous. Mild mitral valve regurgitation. No  evidence of mitral stenosis.   5. The aortic valve is tricuspid. Aortic valve regurgitation is mild. No  aortic stenosis is present.   6. The inferior vena cava is dilated in size with >50% respiratory  variability, suggesting right atrial pressure of 8 mmHg.   7. Tricuspid valve regurgitation is mild to moderate.   8. Right atrial size was mildly dilated.   Comparison(s): Prior images unable to be directly viewed, comparison made  by report only. 03/10/14 EF 60-65%. PA pressure .   Risk Assessment/Calculations:    Physical Exam:   VS:  There were no vitals taken for this visit.   Wt Readings from Last 3 Encounters:  04/19/23 122 lb 6.4 oz (55.5 kg)  04/04/23 122 lb 3.2 oz (55.4 kg)  09/26/22 119 lb 3.2 oz (54.1 kg)    GEN: Well nourished, well developed in no acute distress NECK: No JVD; No carotid bruits CARDIAC: RRR, no murmurs, rubs, gallops RESPIRATORY:  CTA b/l without rales, wheezing or rhonchi  ABDOMEN: Soft, non-tender, non-distended EXTREMITIES: No edema; No deformity  ASSESSMENT AND PLAN: .    persistent AFib CHA2DS2Vasc is 7, on Eliquis , appropriately dosed Tikosyn  w/stable QTc reviewed meds Re-enforced tikosyn  teaching Labs today  HTN She is somewhat non-committal about her BP, she had self increased her losartan  to 75mg  daily without much if any improvement in her BP Advised 100mg  losartan  daily and she will follow up with her PMD for her BP  Secondary hypercoagulable state 2/2 AFib  Dispo: back with EP again in 58mo, sooner if needed  Signed, Debbie Fails, PA-C

## 2023-09-28 NOTE — Addendum Note (Signed)
 Addended by: Ripley Chen on: 09/28/2023 03:34 PM   Modules accepted: Orders

## 2023-09-29 LAB — CBC
Hematocrit: 35.5 % (ref 34.0–46.6)
Hemoglobin: 11.4 g/dL (ref 11.1–15.9)
MCH: 33.1 pg — ABNORMAL HIGH (ref 26.6–33.0)
MCHC: 32.1 g/dL (ref 31.5–35.7)
MCV: 103 fL — ABNORMAL HIGH (ref 79–97)
Platelets: 260 10*3/uL (ref 150–450)
RBC: 3.44 x10E6/uL — ABNORMAL LOW (ref 3.77–5.28)
RDW: 13.5 % (ref 11.7–15.4)
WBC: 6.5 10*3/uL (ref 3.4–10.8)

## 2023-09-29 LAB — BASIC METABOLIC PANEL WITH GFR
BUN/Creatinine Ratio: 26 (ref 12–28)
BUN: 18 mg/dL (ref 10–36)
CO2: 20 mmol/L (ref 20–29)
Calcium: 9.7 mg/dL (ref 8.7–10.3)
Chloride: 101 mmol/L (ref 96–106)
Creatinine, Ser: 0.69 mg/dL (ref 0.57–1.00)
Glucose: 87 mg/dL (ref 70–99)
Potassium: 4.7 mmol/L (ref 3.5–5.2)
Sodium: 137 mmol/L (ref 134–144)
eGFR: 82 mL/min/{1.73_m2} (ref >59)

## 2023-09-29 LAB — MAGNESIUM: Magnesium: 2 mg/dL (ref 1.6–2.3)

## 2023-10-02 ENCOUNTER — Ambulatory Visit: Payer: Self-pay | Admitting: Physician Assistant

## 2023-10-08 ENCOUNTER — Emergency Department (HOSPITAL_COMMUNITY)

## 2023-10-08 ENCOUNTER — Inpatient Hospital Stay (HOSPITAL_COMMUNITY)
Admission: EM | Admit: 2023-10-08 | Discharge: 2023-10-22 | DRG: 958 | Disposition: A | Discharge status: Skilled Nursing Facility | Attending: General Surgery | Admitting: General Surgery

## 2023-10-08 DIAGNOSIS — Z66 Do not resuscitate: Secondary | ICD-10-CM | POA: Diagnosis not present

## 2023-10-08 DIAGNOSIS — M47812 Spondylosis without myelopathy or radiculopathy, cervical region: Secondary | ICD-10-CM | POA: Diagnosis not present

## 2023-10-08 DIAGNOSIS — I96 Gangrene, not elsewhere classified: Secondary | ICD-10-CM | POA: Diagnosis not present

## 2023-10-08 DIAGNOSIS — S2232XA Fracture of one rib, left side, initial encounter for closed fracture: Secondary | ICD-10-CM | POA: Diagnosis not present

## 2023-10-08 DIAGNOSIS — Z9842 Cataract extraction status, left eye: Secondary | ICD-10-CM

## 2023-10-08 DIAGNOSIS — S32119A Unspecified Zone I fracture of sacrum, initial encounter for closed fracture: Secondary | ICD-10-CM | POA: Diagnosis not present

## 2023-10-08 DIAGNOSIS — Z808 Family history of malignant neoplasm of other organs or systems: Secondary | ICD-10-CM

## 2023-10-08 DIAGNOSIS — E871 Hypo-osmolality and hyponatremia: Secondary | ICD-10-CM | POA: Diagnosis present

## 2023-10-08 DIAGNOSIS — E039 Hypothyroidism, unspecified: Secondary | ICD-10-CM | POA: Diagnosis present

## 2023-10-08 DIAGNOSIS — M1711 Unilateral primary osteoarthritis, right knee: Secondary | ICD-10-CM | POA: Diagnosis not present

## 2023-10-08 DIAGNOSIS — J9690 Respiratory failure, unspecified, unspecified whether with hypoxia or hypercapnia: Secondary | ICD-10-CM | POA: Diagnosis not present

## 2023-10-08 DIAGNOSIS — S72421A Displaced fracture of lateral condyle of right femur, initial encounter for closed fracture: Secondary | ICD-10-CM | POA: Diagnosis not present

## 2023-10-08 DIAGNOSIS — Z79899 Other long term (current) drug therapy: Secondary | ICD-10-CM

## 2023-10-08 DIAGNOSIS — R4182 Altered mental status, unspecified: Secondary | ICD-10-CM | POA: Diagnosis present

## 2023-10-08 DIAGNOSIS — S32811A Multiple fractures of pelvis with unstable disruption of pelvic ring, initial encounter for closed fracture: Secondary | ICD-10-CM | POA: Diagnosis not present

## 2023-10-08 DIAGNOSIS — I472 Ventricular tachycardia, unspecified: Secondary | ICD-10-CM | POA: Diagnosis not present

## 2023-10-08 DIAGNOSIS — Z818 Family history of other mental and behavioral disorders: Secondary | ICD-10-CM

## 2023-10-08 DIAGNOSIS — S32591A Other specified fracture of right pubis, initial encounter for closed fracture: Secondary | ICD-10-CM | POA: Diagnosis not present

## 2023-10-08 DIAGNOSIS — D62 Acute posthemorrhagic anemia: Secondary | ICD-10-CM | POA: Diagnosis not present

## 2023-10-08 DIAGNOSIS — E785 Hyperlipidemia, unspecified: Secondary | ICD-10-CM | POA: Diagnosis present

## 2023-10-08 DIAGNOSIS — S8011XA Contusion of right lower leg, initial encounter: Secondary | ICD-10-CM | POA: Diagnosis present

## 2023-10-08 DIAGNOSIS — S32049A Unspecified fracture of fourth lumbar vertebra, initial encounter for closed fracture: Secondary | ICD-10-CM | POA: Diagnosis present

## 2023-10-08 DIAGNOSIS — R29818 Other symptoms and signs involving the nervous system: Secondary | ICD-10-CM | POA: Diagnosis not present

## 2023-10-08 DIAGNOSIS — I672 Cerebral atherosclerosis: Secondary | ICD-10-CM | POA: Diagnosis not present

## 2023-10-08 DIAGNOSIS — R0989 Other specified symptoms and signs involving the circulatory and respiratory systems: Secondary | ICD-10-CM | POA: Diagnosis not present

## 2023-10-08 DIAGNOSIS — Z821 Family history of blindness and visual loss: Secondary | ICD-10-CM

## 2023-10-08 DIAGNOSIS — S42002A Fracture of unspecified part of left clavicle, initial encounter for closed fracture: Secondary | ICD-10-CM | POA: Diagnosis present

## 2023-10-08 DIAGNOSIS — E031 Congenital hypothyroidism without goiter: Secondary | ICD-10-CM | POA: Diagnosis not present

## 2023-10-08 DIAGNOSIS — Z96643 Presence of artificial hip joint, bilateral: Secondary | ICD-10-CM | POA: Diagnosis not present

## 2023-10-08 DIAGNOSIS — Z79890 Hormone replacement therapy (postmenopausal): Secondary | ICD-10-CM

## 2023-10-08 DIAGNOSIS — S32512A Fracture of superior rim of left pubis, initial encounter for closed fracture: Secondary | ICD-10-CM | POA: Diagnosis not present

## 2023-10-08 DIAGNOSIS — S6291XS Unspecified fracture of right wrist and hand, sequela: Secondary | ICD-10-CM | POA: Diagnosis not present

## 2023-10-08 DIAGNOSIS — M353 Polymyalgia rheumatica: Secondary | ICD-10-CM | POA: Diagnosis present

## 2023-10-08 DIAGNOSIS — Z9889 Other specified postprocedural states: Secondary | ICD-10-CM | POA: Diagnosis not present

## 2023-10-08 DIAGNOSIS — Y92410 Unspecified street and highway as the place of occurrence of the external cause: Secondary | ICD-10-CM

## 2023-10-08 DIAGNOSIS — M19041 Primary osteoarthritis, right hand: Secondary | ICD-10-CM | POA: Diagnosis present

## 2023-10-08 DIAGNOSIS — S32039A Unspecified fracture of third lumbar vertebra, initial encounter for closed fracture: Secondary | ICD-10-CM | POA: Diagnosis not present

## 2023-10-08 DIAGNOSIS — E875 Hyperkalemia: Secondary | ICD-10-CM | POA: Diagnosis present

## 2023-10-08 DIAGNOSIS — Z90710 Acquired absence of both cervix and uterus: Secondary | ICD-10-CM

## 2023-10-08 DIAGNOSIS — Z7401 Bed confinement status: Secondary | ICD-10-CM | POA: Diagnosis not present

## 2023-10-08 DIAGNOSIS — M810 Age-related osteoporosis without current pathological fracture: Secondary | ICD-10-CM | POA: Diagnosis present

## 2023-10-08 DIAGNOSIS — S32592A Other specified fracture of left pubis, initial encounter for closed fracture: Secondary | ICD-10-CM | POA: Diagnosis not present

## 2023-10-08 DIAGNOSIS — M19049 Primary osteoarthritis, unspecified hand: Secondary | ICD-10-CM | POA: Diagnosis not present

## 2023-10-08 DIAGNOSIS — R45851 Suicidal ideations: Secondary | ICD-10-CM | POA: Diagnosis not present

## 2023-10-08 DIAGNOSIS — Z041 Encounter for examination and observation following transport accident: Secondary | ICD-10-CM | POA: Diagnosis not present

## 2023-10-08 DIAGNOSIS — Z4789 Encounter for other orthopedic aftercare: Secondary | ICD-10-CM | POA: Diagnosis not present

## 2023-10-08 DIAGNOSIS — S72411A Displaced unspecified condyle fracture of lower end of right femur, initial encounter for closed fracture: Secondary | ICD-10-CM | POA: Diagnosis present

## 2023-10-08 DIAGNOSIS — Z8261 Family history of arthritis: Secondary | ICD-10-CM

## 2023-10-08 DIAGNOSIS — E739 Lactose intolerance, unspecified: Secondary | ICD-10-CM | POA: Diagnosis present

## 2023-10-08 DIAGNOSIS — M85862 Other specified disorders of bone density and structure, left lower leg: Secondary | ICD-10-CM | POA: Diagnosis not present

## 2023-10-08 DIAGNOSIS — Z807 Family history of other malignant neoplasms of lymphoid, hematopoietic and related tissues: Secondary | ICD-10-CM

## 2023-10-08 DIAGNOSIS — S2242XA Multiple fractures of ribs, left side, initial encounter for closed fracture: Secondary | ICD-10-CM | POA: Diagnosis not present

## 2023-10-08 DIAGNOSIS — S3289XA Fracture of other parts of pelvis, initial encounter for closed fracture: Secondary | ICD-10-CM | POA: Diagnosis not present

## 2023-10-08 DIAGNOSIS — Z96642 Presence of left artificial hip joint: Secondary | ICD-10-CM | POA: Diagnosis present

## 2023-10-08 DIAGNOSIS — Z9841 Cataract extraction status, right eye: Secondary | ICD-10-CM

## 2023-10-08 DIAGNOSIS — Z888 Allergy status to other drugs, medicaments and biological substances status: Secondary | ICD-10-CM

## 2023-10-08 DIAGNOSIS — Z87891 Personal history of nicotine dependence: Secondary | ICD-10-CM

## 2023-10-08 DIAGNOSIS — Z8673 Personal history of transient ischemic attack (TIA), and cerebral infarction without residual deficits: Secondary | ICD-10-CM

## 2023-10-08 DIAGNOSIS — R339 Retention of urine, unspecified: Secondary | ICD-10-CM | POA: Diagnosis not present

## 2023-10-08 DIAGNOSIS — S32509A Unspecified fracture of unspecified pubis, initial encounter for closed fracture: Secondary | ICD-10-CM

## 2023-10-08 DIAGNOSIS — I10 Essential (primary) hypertension: Secondary | ICD-10-CM | POA: Diagnosis not present

## 2023-10-08 DIAGNOSIS — I119 Hypertensive heart disease without heart failure: Secondary | ICD-10-CM | POA: Diagnosis present

## 2023-10-08 DIAGNOSIS — M898X8 Other specified disorders of bone, other site: Secondary | ICD-10-CM | POA: Diagnosis not present

## 2023-10-08 DIAGNOSIS — Z85828 Personal history of other malignant neoplasm of skin: Secondary | ICD-10-CM

## 2023-10-08 DIAGNOSIS — I480 Paroxysmal atrial fibrillation: Secondary | ICD-10-CM | POA: Diagnosis not present

## 2023-10-08 DIAGNOSIS — Z743 Need for continuous supervision: Secondary | ICD-10-CM | POA: Diagnosis not present

## 2023-10-08 DIAGNOSIS — D509 Iron deficiency anemia, unspecified: Secondary | ICD-10-CM | POA: Diagnosis not present

## 2023-10-08 DIAGNOSIS — Z853 Personal history of malignant neoplasm of breast: Secondary | ICD-10-CM

## 2023-10-08 DIAGNOSIS — H9193 Unspecified hearing loss, bilateral: Secondary | ICD-10-CM | POA: Diagnosis present

## 2023-10-08 DIAGNOSIS — Z0189 Encounter for other specified special examinations: Secondary | ICD-10-CM | POA: Diagnosis not present

## 2023-10-08 DIAGNOSIS — Z841 Family history of disorders of kidney and ureter: Secondary | ICD-10-CM

## 2023-10-08 DIAGNOSIS — S20312A Abrasion of left front wall of thorax, initial encounter: Secondary | ICD-10-CM | POA: Diagnosis present

## 2023-10-08 DIAGNOSIS — Z85820 Personal history of malignant melanoma of skin: Secondary | ICD-10-CM

## 2023-10-08 DIAGNOSIS — S329XXA Fracture of unspecified parts of lumbosacral spine and pelvis, initial encounter for closed fracture: Secondary | ICD-10-CM | POA: Diagnosis not present

## 2023-10-08 DIAGNOSIS — M818 Other osteoporosis without current pathological fracture: Secondary | ICD-10-CM | POA: Diagnosis not present

## 2023-10-08 DIAGNOSIS — J939 Pneumothorax, unspecified: Secondary | ICD-10-CM | POA: Diagnosis not present

## 2023-10-08 DIAGNOSIS — Z9012 Acquired absence of left breast and nipple: Secondary | ICD-10-CM

## 2023-10-08 DIAGNOSIS — M4802 Spinal stenosis, cervical region: Secondary | ICD-10-CM | POA: Diagnosis not present

## 2023-10-08 DIAGNOSIS — R6889 Other general symptoms and signs: Secondary | ICD-10-CM | POA: Diagnosis not present

## 2023-10-08 DIAGNOSIS — S42022A Displaced fracture of shaft of left clavicle, initial encounter for closed fracture: Secondary | ICD-10-CM | POA: Diagnosis not present

## 2023-10-08 DIAGNOSIS — S270XXA Traumatic pneumothorax, initial encounter: Principal | ICD-10-CM | POA: Diagnosis present

## 2023-10-08 DIAGNOSIS — S329XXD Fracture of unspecified parts of lumbosacral spine and pelvis, subsequent encounter for fracture with routine healing: Secondary | ICD-10-CM | POA: Diagnosis not present

## 2023-10-08 DIAGNOSIS — S0990XA Unspecified injury of head, initial encounter: Secondary | ICD-10-CM | POA: Diagnosis not present

## 2023-10-08 DIAGNOSIS — I517 Cardiomegaly: Secondary | ICD-10-CM | POA: Diagnosis not present

## 2023-10-08 DIAGNOSIS — I4891 Unspecified atrial fibrillation: Secondary | ICD-10-CM | POA: Diagnosis present

## 2023-10-08 DIAGNOSIS — S32030A Wedge compression fracture of third lumbar vertebra, initial encounter for closed fracture: Secondary | ICD-10-CM | POA: Diagnosis not present

## 2023-10-08 DIAGNOSIS — Z7901 Long term (current) use of anticoagulants: Secondary | ICD-10-CM | POA: Diagnosis not present

## 2023-10-08 DIAGNOSIS — Z8249 Family history of ischemic heart disease and other diseases of the circulatory system: Secondary | ICD-10-CM

## 2023-10-08 DIAGNOSIS — F32A Depression, unspecified: Secondary | ICD-10-CM | POA: Diagnosis present

## 2023-10-08 DIAGNOSIS — Z01818 Encounter for other preprocedural examination: Secondary | ICD-10-CM | POA: Diagnosis not present

## 2023-10-08 DIAGNOSIS — Z8379 Family history of other diseases of the digestive system: Secondary | ICD-10-CM

## 2023-10-08 DIAGNOSIS — S3210XA Unspecified fracture of sacrum, initial encounter for closed fracture: Secondary | ICD-10-CM | POA: Diagnosis not present

## 2023-10-08 DIAGNOSIS — R404 Transient alteration of awareness: Secondary | ICD-10-CM | POA: Diagnosis not present

## 2023-10-08 DIAGNOSIS — S72424A Nondisplaced fracture of lateral condyle of right femur, initial encounter for closed fracture: Secondary | ICD-10-CM | POA: Diagnosis not present

## 2023-10-08 DIAGNOSIS — J9811 Atelectasis: Secondary | ICD-10-CM | POA: Diagnosis not present

## 2023-10-08 DIAGNOSIS — S8012XA Contusion of left lower leg, initial encounter: Secondary | ICD-10-CM | POA: Diagnosis present

## 2023-10-08 DIAGNOSIS — S3994XA Unspecified injury of external genitals, initial encounter: Secondary | ICD-10-CM | POA: Diagnosis present

## 2023-10-08 DIAGNOSIS — M7989 Other specified soft tissue disorders: Secondary | ICD-10-CM | POA: Diagnosis not present

## 2023-10-08 DIAGNOSIS — F419 Anxiety disorder, unspecified: Secondary | ICD-10-CM | POA: Diagnosis present

## 2023-10-08 LAB — SAMPLE TO BLOOD BANK

## 2023-10-08 LAB — COMPREHENSIVE METABOLIC PANEL WITH GFR
ALT: 60 U/L — ABNORMAL HIGH (ref 0–44)
AST: 77 U/L — ABNORMAL HIGH (ref 15–41)
Albumin: 3.6 g/dL (ref 3.5–5.0)
Alkaline Phosphatase: 70 U/L (ref 38–126)
Anion gap: 9 (ref 5–15)
BUN: 14 mg/dL (ref 8–23)
CO2: 23 mmol/L (ref 22–32)
Calcium: 8.9 mg/dL (ref 8.9–10.3)
Chloride: 101 mmol/L (ref 98–111)
Creatinine, Ser: 0.75 mg/dL (ref 0.44–1.00)
GFR, Estimated: >60 mL/min (ref >60)
Glucose, Bld: 148 mg/dL — ABNORMAL HIGH (ref 70–99)
Potassium: 3.6 mmol/L (ref 3.5–5.1)
Sodium: 133 mmol/L — ABNORMAL LOW (ref 135–145)
Total Bilirubin: 0.7 mg/dL (ref 0.0–1.2)
Total Protein: 6.2 g/dL — ABNORMAL LOW (ref 6.5–8.1)

## 2023-10-08 LAB — PROTIME-INR
INR: 1.1 (ref 0.8–1.2)
Prothrombin Time: 14.3 s (ref 11.4–15.2)

## 2023-10-08 LAB — AMMONIA: Ammonia: 25 umol/L (ref 9–35)

## 2023-10-08 LAB — CBC
HCT: 34.4 % — ABNORMAL LOW (ref 36.0–46.0)
Hemoglobin: 11.3 g/dL — ABNORMAL LOW (ref 12.0–15.0)
MCH: 33.7 pg (ref 26.0–34.0)
MCHC: 32.8 g/dL (ref 30.0–36.0)
MCV: 102.7 fL — ABNORMAL HIGH (ref 80.0–100.0)
Platelets: 256 10*3/uL (ref 150–400)
RBC: 3.35 MIL/uL — ABNORMAL LOW (ref 3.87–5.11)
RDW: 14.2 % (ref 11.5–15.5)
WBC: 13.3 10*3/uL — ABNORMAL HIGH (ref 4.0–10.5)
nRBC: 0.2 % (ref 0.0–0.2)

## 2023-10-08 LAB — I-STAT CG4 LACTIC ACID, ED: Lactic Acid, Venous: 3.3 mmol/L (ref 0.5–1.9)

## 2023-10-08 LAB — I-STAT VENOUS BLOOD GAS, ED
Acid-base deficit: 2 mmol/L (ref 0.0–2.0)
Bicarbonate: 22.8 mmol/L (ref 20.0–28.0)
Calcium, Ion: 1.12 mmol/L — ABNORMAL LOW (ref 1.15–1.40)
HCT: 30 % — ABNORMAL LOW (ref 36.0–46.0)
Hemoglobin: 10.2 g/dL — ABNORMAL LOW (ref 12.0–15.0)
O2 Saturation: 99 %
Potassium: 3.8 mmol/L (ref 3.5–5.1)
Sodium: 134 mmol/L — ABNORMAL LOW (ref 135–145)
TCO2: 24 mmol/L (ref 22–32)
pCO2, Ven: 36 mmHg — ABNORMAL LOW (ref 44–60)
pH, Ven: 7.41 (ref 7.25–7.43)
pO2, Ven: 126 mmHg — ABNORMAL HIGH (ref 32–45)

## 2023-10-08 LAB — I-STAT CHEM 8, ED
BUN: 18 mg/dL (ref 8–23)
Calcium, Ion: 1.21 mmol/L (ref 1.15–1.40)
Chloride: 100 mmol/L (ref 98–111)
Creatinine, Ser: 0.7 mg/dL (ref 0.44–1.00)
Glucose, Bld: 148 mg/dL — ABNORMAL HIGH (ref 70–99)
HCT: 39 % (ref 36.0–46.0)
Hemoglobin: 13.3 g/dL (ref 12.0–15.0)
Potassium: 3.6 mmol/L (ref 3.5–5.1)
Sodium: 134 mmol/L — ABNORMAL LOW (ref 135–145)
TCO2: 22 mmol/L (ref 22–32)

## 2023-10-08 LAB — CBG MONITORING, ED: Glucose-Capillary: 136 mg/dL — ABNORMAL HIGH (ref 70–99)

## 2023-10-08 LAB — ETHANOL: Alcohol, Ethyl (B): <15 mg/dL (ref <15)

## 2023-10-08 MED ORDER — ONDANSETRON 4 MG PO TBDP: 4.0000 mg | ORAL_TABLET | Freq: Four times a day (QID) | ORAL | Status: DC | PRN

## 2023-10-08 MED ORDER — METHOCARBAMOL 500 MG PO TABS
500.0000 mg | ORAL_TABLET | Freq: Three times a day (TID) | ORAL | Status: DC
  Administered 2023-10-09 – 2023-10-10 (×4): 500 mg via ORAL
  Filled 2023-10-08 (×6): qty 1

## 2023-10-08 MED ORDER — IOHEXOL 350 MG/ML SOLN
75.0000 mL | Freq: Once | INTRAVENOUS | Status: AC | PRN
Start: 2023-10-08 — End: ?

## 2023-10-08 MED ORDER — DOCUSATE SODIUM 100 MG PO CAPS
100.0000 mg | ORAL_CAPSULE | Freq: Two times a day (BID) | ORAL | Status: DC
  Filled 2023-10-08: qty 1

## 2023-10-08 MED ORDER — METOPROLOL TARTRATE 5 MG/5ML IV SOLN: 5.0000 mg | Freq: Four times a day (QID) | INTRAVENOUS | Status: DC | PRN

## 2023-10-08 MED ORDER — ACETAMINOPHEN 500 MG PO TABS: 1000.0000 mg | ORAL_TABLET | Freq: Four times a day (QID) | ORAL | Status: DC

## 2023-10-08 MED ORDER — ACETAMINOPHEN 500 MG PO TABS
1000.0000 mg | ORAL_TABLET | Freq: Four times a day (QID) | ORAL | Status: DC
  Administered 2023-10-08 – 2023-10-10 (×8): 1000 mg via ORAL
  Filled 2023-10-08 (×11): qty 2

## 2023-10-08 MED ORDER — TRAMADOL HCL 50 MG PO TABS
25.0000 mg | ORAL_TABLET | Freq: Four times a day (QID) | ORAL | Status: DC | PRN
  Administered 2023-10-09 – 2023-10-10 (×2): 25 mg via ORAL
  Filled 2023-10-08 (×2): qty 1

## 2023-10-08 MED ORDER — FENTANYL CITRATE PF 50 MCG/ML IJ SOSY
25.0000 ug | PREFILLED_SYRINGE | Freq: Once | INTRAMUSCULAR | Status: AC
  Administered 2023-10-08: 25 ug via INTRAVENOUS
  Filled 2023-10-08: qty 1

## 2023-10-08 MED ORDER — ONDANSETRON HCL 4 MG/2ML IJ SOLN: 4.0000 mg | Freq: Four times a day (QID) | INTRAMUSCULAR | Status: DC | PRN

## 2023-10-08 MED ORDER — ENOXAPARIN SODIUM 30 MG/0.3ML IJ SOSY
30.0000 mg | PREFILLED_SYRINGE | Freq: Two times a day (BID) | INTRAMUSCULAR | Status: DC
Start: 2023-10-09 — End: 2023-10-12
  Administered 2023-10-09 – 2023-10-12 (×5): 30 mg via SUBCUTANEOUS
  Filled 2023-10-08 (×6): qty 0.3

## 2023-10-08 MED ORDER — IOHEXOL 350 MG/ML SOLN
75.0000 mL | Freq: Once | INTRAVENOUS | Status: AC | PRN
  Administered 2023-10-08: 75 mL via INTRAVENOUS

## 2023-10-08 MED ORDER — METHOCARBAMOL 1000 MG/10ML IJ SOLN
500.0000 mg | Freq: Three times a day (TID) | INTRAMUSCULAR | Status: DC
  Administered 2023-10-08 – 2023-10-11 (×4): 500 mg via INTRAVENOUS
  Filled 2023-10-08 (×4): qty 10

## 2023-10-08 MED ORDER — POLYETHYLENE GLYCOL 3350 17 G PO PACK: 17.0000 g | PACK | Freq: Every day | ORAL | Status: DC | PRN

## 2023-10-08 MED ORDER — LEVOTHYROXINE SODIUM 100 MCG PO TABS
100.0000 ug | ORAL_TABLET | Freq: Every day | ORAL | Status: DC
  Administered 2023-10-09 – 2023-10-22 (×14): 100 ug via ORAL
  Filled 2023-10-08 (×14): qty 1

## 2023-10-08 MED ORDER — DOCUSATE SODIUM 50 MG/5ML PO LIQD
100.0000 mg | Freq: Two times a day (BID) | ORAL | Status: DC
  Administered 2023-10-09 – 2023-10-17 (×14): 100 mg via ORAL
  Filled 2023-10-08 (×15): qty 10

## 2023-10-08 MED ORDER — SODIUM CHLORIDE 0.9 % IV SOLN: INTRAVENOUS | Status: AC

## 2023-10-08 MED ORDER — HYDRALAZINE HCL 20 MG/ML IJ SOLN
10.0000 mg | INTRAMUSCULAR | Status: DC | PRN
  Administered 2023-10-09 – 2023-10-21 (×2): 10 mg via INTRAVENOUS
  Filled 2023-10-08 (×3): qty 1

## 2023-10-08 MED ORDER — SODIUM CHLORIDE 0.9 % IV BOLUS
1000.0000 mL | Freq: Once | INTRAVENOUS | Status: AC
  Administered 2023-10-08: 1000 mL via INTRAVENOUS
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
## 2023-10-08 NOTE — Progress Notes (Signed)
 Transition of Care [facility]) - CAGE-AID Screening   Patient Details  Name: [name] MRN: [id number] Date of Birth: 1931-09-01   Asa Bjork, RN Trauma Response Nurse Phone Number: [phone] 10/08/2023, 6:09 PM    CAGE-AID Screening:    Have You Ever Felt You Ought to Cut Down on Your Drinking or Drug Use?: No Have People Annoyed You By Critizing Your Drinking Or Drug Use?: No Have You Felt Bad Or Guilty About Your Drinking Or Drug Use?: No Have You Ever Had a Drink or Used Drugs First Thing In The Morning to Steady Your Nerves or to Get Rid of a Hangover?: No CAGE-AID Score: 0  Substance Abuse Education Offered: (S) No (Denies ETOH useNo services necessary)

## 2023-10-08 NOTE — ED Notes (Signed)
Patient transported to CT with TRN.  
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
## 2023-10-08 NOTE — Progress Notes (Signed)
   Providing Compassionate, Quality Care - Together   I reviewed Ms. Rupert's imaging studies. She has acute nondisplaced fractures of the left L3 and L4 transverse processes, acute nondisplaced fractures of bilateral sacral ala, and a superior endplate fracture at L3. Recommend a lumbosacral corset for comfort for her lumbar fractures. Activity level to be determined by ortho due to her pelvic fractures.  CT head and neck did not show any acute injuries.  I am in communication with my attending and they agree with the plan for this patient.   Henreitta Locus, DNP, AGNP-C Nurse Practitioner  [facility] [address], [address], [place], [place] [address] P: [phone]    F: [phone]

## 2023-10-09 ENCOUNTER — Inpatient Hospital Stay (HOSPITAL_COMMUNITY)

## 2023-10-09 LAB — CBC
HCT: 30.6 % — ABNORMAL LOW (ref 36.0–46.0)
HCT: 31.6 % — ABNORMAL LOW (ref 36.0–46.0)
Hemoglobin: 10.5 g/dL — ABNORMAL LOW (ref 12.0–15.0)
Hemoglobin: 10.5 g/dL — ABNORMAL LOW (ref 12.0–15.0)
MCH: 33.8 pg (ref 26.0–34.0)
MCH: 34.5 pg — ABNORMAL HIGH (ref 26.0–34.0)
MCHC: 33.2 g/dL (ref 30.0–36.0)
MCHC: 34.3 g/dL (ref 30.0–36.0)
MCV: 100.7 fL — ABNORMAL HIGH (ref 80.0–100.0)
MCV: 101.6 fL — ABNORMAL HIGH (ref 80.0–100.0)
Platelets: 215 10*3/uL (ref 150–400)
Platelets: 227 10*3/uL (ref 150–400)
RBC: 3.04 MIL/uL — ABNORMAL LOW (ref 3.87–5.11)
RBC: 3.11 MIL/uL — ABNORMAL LOW (ref 3.87–5.11)
RDW: 14.2 % (ref 11.5–15.5)
RDW: 14.4 % (ref 11.5–15.5)
WBC: 24.7 10*3/uL — ABNORMAL HIGH (ref 4.0–10.5)
WBC: 27.4 10*3/uL — ABNORMAL HIGH (ref 4.0–10.5)
nRBC: 0 % (ref 0.0–0.2)
nRBC: 0 % (ref 0.0–0.2)

## 2023-10-09 LAB — URINALYSIS, ROUTINE W REFLEX MICROSCOPIC
Bilirubin Urine: NEGATIVE
Glucose, UA: NEGATIVE mg/dL
Ketones, ur: NEGATIVE mg/dL
Leukocytes,Ua: NEGATIVE
Nitrite: NEGATIVE
Protein, ur: 30 mg/dL — AB
Specific Gravity, Urine: 1.015 (ref 1.005–1.030)
pH: 5.5 (ref 5.0–8.0)

## 2023-10-09 LAB — BASIC METABOLIC PANEL WITH GFR
Anion gap: 6 (ref 5–15)
BUN: 20 mg/dL (ref 8–23)
CO2: 20 mmol/L — ABNORMAL LOW (ref 22–32)
Calcium: 8.2 mg/dL — ABNORMAL LOW (ref 8.9–10.3)
Chloride: 110 mmol/L (ref 98–111)
Creatinine, Ser: 0.82 mg/dL (ref 0.44–1.00)
GFR, Estimated: >60 mL/min (ref >60)
Glucose, Bld: 154 mg/dL — ABNORMAL HIGH (ref 70–99)
Potassium: 4.4 mmol/L (ref 3.5–5.1)
Sodium: 136 mmol/L (ref 135–145)

## 2023-10-09 LAB — URINALYSIS, MICROSCOPIC (REFLEX)

## 2023-10-09 LAB — CREATININE, SERUM
Creatinine, Ser: 0.79 mg/dL (ref 0.44–1.00)
GFR, Estimated: >60 mL/min (ref >60)

## 2023-10-09 LAB — MAGNESIUM: Magnesium: 1.8 mg/dL (ref 1.7–2.4)

## 2023-10-09 MED ORDER — DOFETILIDE 250 MCG PO CAPS
250.0000 ug | ORAL_CAPSULE | Freq: Two times a day (BID) | ORAL | Status: DC
  Administered 2023-10-09 – 2023-10-22 (×27): 250 ug via ORAL
  Filled 2023-10-09 (×28): qty 1

## 2023-10-09 MED ORDER — LOSARTAN POTASSIUM 50 MG PO TABS
50.0000 mg | ORAL_TABLET | Freq: Every day | ORAL | Status: DC
  Administered 2023-10-09 – 2023-10-15 (×6): 50 mg via ORAL
  Filled 2023-10-09 (×6): qty 1

## 2023-10-09 MED ORDER — MORPHINE SULFATE (PF) 2 MG/ML IV SOLN
2.0000 mg | INTRAVENOUS | Status: DC | PRN
  Administered 2023-10-09 – 2023-10-10 (×4): 2 mg via INTRAVENOUS
  Filled 2023-10-09 (×4): qty 1

## 2023-10-09 MED ORDER — OXYCODONE HCL 5 MG PO TABS
5.0000 mg | ORAL_TABLET | ORAL | Status: DC | PRN
  Administered 2023-10-10: 5 mg via ORAL
  Filled 2023-10-09: qty 1

## 2023-10-09 NOTE — ED Notes (Addendum)
 MULTIPLE ATTEMPTS WERE MADE FOR FOLEY CATHETER BY SEVERAL RNS WITH NO SUCCESS - autumn, TRN notified

## 2023-10-09 NOTE — ED Notes (Signed)
 Trauma Event Note  0745: Myself and Autumn, TRN attempted to place 47F foley without success.  Dr Aniceto Barley was notified.   0800: I personally spoke with pt's daughter and updated her on pt's status and difficulty with catheter placement.   0830: Dr Aniceto Barley attempted foley insertion without success.  Urology was called at this time.   0945: 47F Coude catheter placed by Urology. of urine returned at this time.  Urologist confirmed placement with ultrasound and visualized decompressing bladder.    1030: Pt moved to ED1 to hold for PCU bed.  2 family members now at pt's bedside.   Last imported Vital Signs BP (!) 153/70   Pulse 76   Temp 98.4 F (36.9 C) (Oral)   Resp 17   Ht 5\' 4"  (1.626 m)   Wt 120 lb (54.4 kg)   SpO2 97%   BMI 20.60 kg/m   Trending CBC Recent Labs    10/08/23 1629 10/08/23 1638 10/08/23 1858 10/08/23 2349 10/09/23 0525  WBC 13.3*  --   --  27.4* 24.7*  HGB 11.3*   < > 10.2* 10.5* 10.5*  HCT 34.4*   < > 30.0* 31.6* 30.6*  PLT 256  --   --  227 215   < > = values in this interval not displayed.    Trending Coag's Recent Labs    10/08/23 1629  INR 1.1    Trending BMET Recent Labs    10/08/23 1629 10/08/23 1638 10/08/23 1858 10/08/23 2349 10/09/23 0525  NA 133* 134* 134*  --  136  K 3.6 3.6 3.8  --  4.4  CL 101 100  --   --  110  CO2 23  --   --   --  20*  BUN 14 18  --   --  20  CREATININE 0.75 0.70  --  0.79 0.82  GLUCOSE 148* 148*  --   --  154*    Vester Titsworth W  Trauma Response RN  Please call TRN at [phone] for further assistance.
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
## 2023-10-09 NOTE — Plan of Care (Signed)
   Problem: Education: Goal: Knowledge of General Education information will improve Description: Including pain rating scale, medication(s)/side effects and non-pharmacologic comfort measures Outcome: Not Progressing   Problem: Health Behavior/Discharge Planning: Goal: Ability to manage health-related needs will improve Outcome: Not Progressing
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
## 2023-10-09 NOTE — Consult Note (Signed)
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 with bone, no tenting, no instability, no blocks to motion  Sens  Ax/R/M/U intact  Mot   Ax/ R/ PIN/ M/ AIN/ U intact  Rad 2+  Pelvis--no traumatic wounds or rash, no ecchymosis, stable to manual stress  BLE No traumatic wounds, ecchymosis, or rash  Nontender  No knee or ankle effusion  Knee stable to varus/ valgus and anterior/posterior stress  Sens DPN, SPN, TN intact  Motor EHL, ext, flex, evers 5/5  DP 2+, PT 2+, No significant edema  Skin:    General: Skin is warm and dry.  Neurological:     Mental Status: She is alert.  Psychiatric:        Mood and Affect: Mood normal.        Behavior: Behavior normal.     Assessment/Plan: Left clavicle fx -- Plan non-operative management with sling, NWB Pelvic fxs -- Plan initial non-operative management with WBAT BLE. If unable to mobilize 2/2 severe pain would rethink SI fixation. F/u with Dr. Curtiss Dowdy in 2 weeks.    Georganna Kin, PA-C Orthopedic Surgery [phone] 10/09/2023, 9:37 AM
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
## 2023-10-09 NOTE — ED Notes (Signed)
 Bladder scan 697 mLs

## 2023-10-10 ENCOUNTER — Inpatient Hospital Stay (HOSPITAL_COMMUNITY)

## 2023-10-10 LAB — CBC
HCT: 26.7 % — ABNORMAL LOW (ref 36.0–46.0)
Hemoglobin: 8.9 g/dL — ABNORMAL LOW (ref 12.0–15.0)
MCH: 33.8 pg (ref 26.0–34.0)
MCHC: 33.3 g/dL (ref 30.0–36.0)
MCV: 101.5 fL — ABNORMAL HIGH (ref 80.0–100.0)
Platelets: 170 10*3/uL (ref 150–400)
RBC: 2.63 MIL/uL — ABNORMAL LOW (ref 3.87–5.11)
RDW: 14.6 % (ref 11.5–15.5)
WBC: 14.6 10*3/uL — ABNORMAL HIGH (ref 4.0–10.5)
nRBC: 0 % (ref 0.0–0.2)

## 2023-10-10 LAB — BASIC METABOLIC PANEL WITH GFR
Anion gap: 6 (ref 5–15)
BUN: 23 mg/dL (ref 8–23)
CO2: 20 mmol/L — ABNORMAL LOW (ref 22–32)
Calcium: 8.2 mg/dL — ABNORMAL LOW (ref 8.9–10.3)
Chloride: 106 mmol/L (ref 98–111)
Creatinine, Ser: 0.76 mg/dL (ref 0.44–1.00)
GFR, Estimated: >60 mL/min (ref >60)
Glucose, Bld: 119 mg/dL — ABNORMAL HIGH (ref 70–99)
Potassium: 4.1 mmol/L (ref 3.5–5.1)
Sodium: 132 mmol/L — ABNORMAL LOW (ref 135–145)

## 2023-10-10 LAB — MAGNESIUM: Magnesium: 2 mg/dL (ref 1.7–2.4)

## 2023-10-10 MED ORDER — CHLORHEXIDINE GLUCONATE CLOTH 2 % EX PADS
6.0000 | MEDICATED_PAD | Freq: Every day | CUTANEOUS | Status: DC
  Administered 2023-10-10 – 2023-10-15 (×6): 6 via TOPICAL

## 2023-10-10 MED ORDER — HALOPERIDOL LACTATE 5 MG/ML IJ SOLN: 2.5000 mg | Freq: Four times a day (QID) | INTRAMUSCULAR | Status: DC | PRN

## 2023-10-10 MED ORDER — FUROSEMIDE 10 MG/ML IJ SOLN
20.0000 mg | Freq: Once | INTRAMUSCULAR | Status: AC
  Administered 2023-10-10: 20 mg via INTRAVENOUS
  Filled 2023-10-10: qty 2

## 2023-10-10 MED ORDER — TAMSULOSIN HCL 0.4 MG PO CAPS
0.4000 mg | ORAL_CAPSULE | Freq: Every day | ORAL | Status: DC
  Administered 2023-10-10 – 2023-10-22 (×12): 0.4 mg via ORAL
  Filled 2023-10-10 (×12): qty 1

## 2023-10-10 MED ORDER — OXYCODONE HCL 5 MG PO TABS
2.5000 mg | ORAL_TABLET | ORAL | Status: DC | PRN
  Administered 2023-10-10 – 2023-10-21 (×24): 5 mg via ORAL
  Filled 2023-10-10 (×25): qty 1

## 2023-10-10 NOTE — Plan of Care (Signed)

## 2023-10-10 NOTE — Anesthesia Preprocedure Evaluation (Signed)
 Anesthesia Evaluation  Patient identified by MRN, date of birth, ID band Patient awake    Reviewed: Allergy & Precautions, NPO status , Patient's Chart, lab work & pertinent test results  History of Anesthesia Complications Negative for: history of anesthetic complications  Airway Mallampati: II  TM Distance: >3 FB Neck ROM: Full    Dental no notable dental hx.    Pulmonary former smoker   Pulmonary exam normal        Cardiovascular hypertension, Pt. on medications Normal cardiovascular exam+ dysrhythmias Atrial Fibrillation   TTE 2022: EF 60-65%, grade II DD, mild pHTN (RVSP 39.61mmHg), moderate LAE, mild RAE, mild MR, mild AR, mild to moderate TR    Neuro/Psych CVA (2015)  negative psych ROS   GI/Hepatic negative GI ROS, Neg liver ROS,,,  Endo/Other  Hypothyroidism    Renal/GU negative Renal ROS  negative genitourinary   Musculoskeletal  (+) Arthritis ,    Abdominal   Peds  Hematology  (+) Blood dyscrasia (Hgb 8.9), anemia   Anesthesia Other Findings sacral fracture and clavicle fracture  Reproductive/Obstetrics                             Anesthesia Physical Anesthesia Plan  ASA: 2  Anesthesia Plan: General   Post-op Pain Management: Tylenol  PO (pre-op)*   Induction: Intravenous  PONV Risk Score and Plan: 3 and Treatment may vary due to age or medical condition, Ondansetron  and Dexamethasone   Airway Management Planned: Oral ETT  Additional Equipment: None  Intra-op Plan:   Post-operative Plan: Extubation in OR  Informed Consent: I have reviewed the patients History and Physical, chart, labs and discussed the procedure including the risks, benefits and alternatives for the proposed anesthesia with the patient or authorized representative who has indicated his/her understanding and acceptance.     Dental advisory given and Consent reviewed with POA  Plan Discussed with:  CRNA  Anesthesia Plan Comments:         Anesthesia Quick Evaluation

## 2023-10-10 NOTE — NC FL2 (Signed)
 [place]  MEDICAID FL2 LEVEL OF CARE FORM     IDENTIFICATION  Patient Name: [name] Birthdate: 06-Aug-1931 Sex: female Admission Date (Current Location): 10/08/2023  [facility] [employer] [place] and [place] Number:  Producer, television/film/video and Address:  [facility]. [facility], [address] [place], [place] [address]      Provider Number: [id number]  Attending Physician Name and Address:  Md, Trauma, MD  Relative Name and Phone Number:       Current Level of Care: Hospital Recommended Level of Care: Skilled Nursing Facility Prior Approval Number:    Date Approved/Denied:   PASRR Number: [id number]  Discharge Plan: SNF    Current Diagnoses: Patient Active Problem List   Diagnosis Date Noted   Pelvic fracture (HCC) 10/08/2023   Right hand fracture, sequela 06/22/2023   Fuchs' corneal dystrophy 01/22/2023   Osteoarthritis of both hands 08/04/2021   Overactive bladder 08/04/2021   Mixed hyperlipidemia 08/03/2021   Estrogen deficiency 01/27/2021   Paroxysmal A-fib (HCC) 10/18/2020   Secondary hypercoagulable state (HCC) 08/26/2020   Osteopenia 03/24/2020   Aortic atherosclerosis (HCC) 01/26/2020   Hyponatremia 11/29/2019   History of loop recorder 11/20/2019   Vitamin D  deficiency 01/14/2019   History of CVA (cerebrovascular accident) without residual deficits 01/14/2019   Nocturnal leg cramps 01/14/2019   Blepharitis of left eye    Malignant melanoma (HCC) 11/21/2018   Hyperkalemia 03/07/2018   Atrial fibrillation (HCC) 04/15/2015   Essential hypertension 03/10/2014   Failed total hip arthroplasty (HCC) 06/27/2012   Painful orthopaedic hardware (HCC) 04/05/2012   Hypothyroidism 07/27/2011    Orientation RESPIRATION BLADDER Height & Weight     Self, Time, Situation, Place  Normal Continent Weight: 120 lb (54.4 kg) Height:  5\' 4"  (162.6 cm)  BEHAVIORAL SYMPTOMS/MOOD NEUROLOGICAL BOWEL NUTRITION STATUS      Continent Diet (please see discharge summary)   AMBULATORY STATUS COMMUNICATION OF NEEDS Skin   Extensive Assist Verbally Surgical wounds (wound incision upper left sternum, left  anterior chest  wound)                       Personal Care Assistance Level of Assistance  Bathing, Feeding, Dressing Bathing Assistance: Limited assistance Feeding assistance: Independent Dressing Assistance: Limited assistance     Functional Limitations Info  Sight, Hearing, Speech Sight Info: Adequate Hearing Info: Adequate Speech Info: Adequate    SPECIAL CARE FACTORS FREQUENCY  PT (By licensed PT), OT (By licensed OT)     PT Frequency: 5x per week OT Frequency: 5x per week            Contractures Contractures Info: Not present    Additional Factors Info  Code Status, Allergies Code Status Info: FULL Allergies Info: Lactose Intolerance,Epinephrine            Current Medications (10/10/2023):  This is the current hospital active medication list Current Facility-Administered Medications  Medication Dose Route Frequency Provider Last Rate Last Admin   acetaminophen  (TYLENOL ) tablet 1,000 mg  1,000 mg Oral Q6H Utomwen, Adesuwa, RPH   1,000 mg at 10/10/23 1135   Chlorhexidine  Gluconate Cloth 2 % PADS 6 each  6 each Topical Daily Anda Bamberg, MD   6 each at 10/10/23 1205   docusate (COLACE) 50 MG/5ML liquid 100 mg  100 mg Oral BID Cannon Champion, MD   100 mg at 10/09/23 2145   dofetilide  (TIKOSYN ) capsule 250 mcg  250 mcg Oral BID Maczis, Michael M, PA-C  250 mcg at 10/10/23 4098   enoxaparin  (LOVENOX ) injection 30 mg  30 mg Subcutaneous Q12H Cannon Champion, MD   30 mg at 10/10/23 1191   hydrALAZINE  (APRESOLINE ) injection 10 mg  10 mg Intravenous Q2H PRN Cannon Champion, MD   10 mg at 10/09/23 1204   iohexol  (OMNIPAQUE ) 350 MG/ML injection 75 mL  75 mL Intravenous Once PRN Horton, Kristie M, DO       levothyroxine  (SYNTHROID ) tablet 100 mcg  100 mcg Oral Q0600 Cannon Champion, MD   100 mcg at 10/10/23 4782    losartan  (COZAAR ) tablet 50 mg  50 mg Oral Daily Maczis, Michael M, PA-C   50 mg at 10/10/23 0813   methocarbamol  (ROBAXIN ) tablet 500 mg  500 mg Oral Q8H Cannon Champion, MD   500 mg at 10/10/23 1358   Or   methocarbamol  (ROBAXIN ) injection 500 mg  500 mg Intravenous Q8H Cannon Champion, MD   500 mg at 10/10/23 0508   metoprolol  tartrate (LOPRESSOR ) injection 5 mg  5 mg Intravenous Q6H PRN Cannon Champion, MD       morphine  (PF) 2 MG/ML injection 2 mg  2 mg Intravenous Q2H PRN Dorena Gander, MD   2 mg at 10/10/23 1025   oxyCODONE  (Oxy IR/ROXICODONE ) immediate release tablet 2.5-5 mg  2.5-5 mg Oral Q4H PRN Maczis, Michael M, PA-C   5 mg at 10/10/23 1535   polyethylene glycol (MIRALAX  / GLYCOLAX ) packet 17 g  17 g Oral Daily PRN Cannon Champion, MD       tamsulosin (FLOMAX) capsule 0.4 mg  0.4 mg Oral Daily Maczis, Michael M, PA-C   0.4 mg at 10/10/23 1025     Discharge Medications: Please see discharge summary for a list of discharge medications.  Relevant Imaging Results:  Relevant Lab Results:   Additional Information SSN [id number]  Valery Gaucher, LCSW

## 2023-10-10 NOTE — Progress Notes (Signed)
 Pt woke up from her nap with slight confusion. Pt was irritable and shouting. Pt was AxOx4 prior to the nap. RN notified on call trauma MD. See new orders in [facility].
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
## 2023-10-10 NOTE — Progress Notes (Signed)
 Came by to check on [name].  She was alert, oriented, and resting comfortably in bed having lunch.  She had no specific urologic complaints and was tolerating her Foley catheter well.  Her renal indices are preserved and she is putting out clear yellow urine.  Foley catheter can be removed at the discretion of primary team whenever it is no longer necessary.  Urology will sign off.  Please feel free to call with questions or concerns

## 2023-10-11 ENCOUNTER — Inpatient Hospital Stay (HOSPITAL_COMMUNITY)

## 2023-10-11 ENCOUNTER — Encounter (HOSPITAL_COMMUNITY): Admission: EM | Disposition: A | Discharge status: Skilled Nursing Facility | Payer: Self-pay | Source: Home / Self Care

## 2023-10-11 ENCOUNTER — Encounter (HOSPITAL_COMMUNITY): Payer: Self-pay

## 2023-10-11 ENCOUNTER — Inpatient Hospital Stay (HOSPITAL_COMMUNITY): Admitting: Anesthesiology

## 2023-10-11 ENCOUNTER — Other Ambulatory Visit: Payer: Self-pay

## 2023-10-11 DIAGNOSIS — S32811A Multiple fractures of pelvis with unstable disruption of pelvic ring, initial encounter for closed fracture: Secondary | ICD-10-CM

## 2023-10-11 HISTORY — PX: SACRO-ILIAC PINNING: SHX5050

## 2023-10-11 HISTORY — PX: KNEE CLOSED REDUCTION: SHX995

## 2023-10-11 HISTORY — PX: ORIF CLAVICULAR FRACTURE: SHX5055

## 2023-10-11 LAB — POCT I-STAT, CHEM 8
BUN: 23 mg/dL (ref 8–23)
Calcium, Ion: 1.14 mmol/L — ABNORMAL LOW (ref 1.15–1.40)
Chloride: 101 mmol/L (ref 98–111)
Creatinine, Ser: 0.8 mg/dL (ref 0.44–1.00)
Glucose, Bld: 116 mg/dL — ABNORMAL HIGH (ref 70–99)
HCT: 26 % — ABNORMAL LOW (ref 36.0–46.0)
Hemoglobin: 8.8 g/dL — ABNORMAL LOW (ref 12.0–15.0)
Potassium: 4.4 mmol/L (ref 3.5–5.1)
Sodium: 131 mmol/L — ABNORMAL LOW (ref 135–145)
TCO2: 21 mmol/L — ABNORMAL LOW (ref 22–32)

## 2023-10-11 SURGERY — OPEN REDUCTION INTERNAL FIXATION (ORIF) CLAVICULAR FRACTURE
Anesthesia: General | Site: Knee | Laterality: Right

## 2023-10-11 MED ORDER — 0.9 % SODIUM CHLORIDE (POUR BTL) OPTIME
TOPICAL | Status: DC | PRN
  Administered 2023-10-11: 1000 mL

## 2023-10-11 MED ORDER — MAGNESIUM HYDROXIDE 400 MG/5ML PO SUSP
30.0000 mL | Freq: Once | ORAL | Status: DC
  Filled 2023-10-11: qty 30

## 2023-10-11 MED ORDER — FENTANYL CITRATE (PF) 250 MCG/5ML IJ SOLN
INTRAMUSCULAR | Status: DC | PRN
  Administered 2023-10-11 (×2): 50 ug via INTRAVENOUS

## 2023-10-11 MED ORDER — PROPOFOL 10 MG/ML IV BOLUS
INTRAVENOUS | Status: DC | PRN
  Administered 2023-10-11: 70 mg via INTRAVENOUS

## 2023-10-11 MED ORDER — ACETAMINOPHEN 500 MG PO TABS
1000.0000 mg | ORAL_TABLET | Freq: Once | ORAL | Status: AC
  Administered 2023-10-11: 1000 mg via ORAL
  Filled 2023-10-11: qty 2

## 2023-10-11 MED ORDER — CHLORHEXIDINE GLUCONATE 0.12 % MT SOLN
OROMUCOSAL | Status: AC
  Filled 2023-10-11: qty 15

## 2023-10-11 MED ORDER — BISACODYL 5 MG PO TBEC
10.0000 mg | DELAYED_RELEASE_TABLET | Freq: Once | ORAL | Status: DC
  Filled 2023-10-11: qty 2

## 2023-10-11 MED ORDER — PROPOFOL 10 MG/ML IV BOLUS
INTRAVENOUS | Status: AC
  Filled 2023-10-11: qty 20

## 2023-10-11 MED ORDER — EPHEDRINE SULFATE-NACL 50-0.9 MG/10ML-% IV SOSY
PREFILLED_SYRINGE | INTRAVENOUS | Status: DC | PRN
  Administered 2023-10-11 (×2): 5 mg via INTRAVENOUS

## 2023-10-11 MED ORDER — ROCURONIUM BROMIDE 10 MG/ML (PF) SYRINGE
PREFILLED_SYRINGE | INTRAVENOUS | Status: DC | PRN
  Administered 2023-10-11: 40 mg via INTRAVENOUS

## 2023-10-11 MED ORDER — SUGAMMADEX SODIUM 200 MG/2ML IV SOLN
INTRAVENOUS | Status: DC | PRN
  Administered 2023-10-11: 200 mg via INTRAVENOUS

## 2023-10-11 MED ORDER — HYDROMORPHONE HCL 1 MG/ML IJ SOLN
0.5000 mg | INTRAMUSCULAR | Status: DC | PRN
  Administered 2023-10-11 – 2023-10-18 (×13): 0.5 mg via INTRAVENOUS
  Filled 2023-10-11 (×14): qty 0.5

## 2023-10-11 MED ORDER — SENNA 8.6 MG PO TABS
2.0000 | ORAL_TABLET | Freq: Once | ORAL | Status: DC
  Filled 2023-10-11: qty 2

## 2023-10-11 MED ORDER — ORAL CARE MOUTH RINSE: 15.0000 mL | Freq: Once | OROMUCOSAL | Status: DC

## 2023-10-11 MED ORDER — CEFAZOLIN SODIUM-DEXTROSE 2-4 GM/100ML-% IV SOLN
INTRAVENOUS | Status: AC
  Filled 2023-10-11: qty 100

## 2023-10-11 MED ORDER — CEFAZOLIN SODIUM-DEXTROSE 2-3 GM-%(50ML) IV SOLR
INTRAVENOUS | Status: DC | PRN
  Administered 2023-10-11: 2 g via INTRAVENOUS

## 2023-10-11 MED ORDER — PHENYLEPHRINE HCL-NACL 20-0.9 MG/250ML-% IV SOLN
INTRAVENOUS | Status: DC | PRN
  Administered 2023-10-11: 50 ug/min via INTRAVENOUS

## 2023-10-11 MED ORDER — LACTATED RINGERS IV SOLN: INTRAVENOUS | Status: DC

## 2023-10-11 MED ORDER — LACTATED RINGERS IV SOLN: INTRAVENOUS | Status: DC | PRN

## 2023-10-11 MED ORDER — LIDOCAINE 2% (20 MG/ML) 5 ML SYRINGE
INTRAMUSCULAR | Status: DC | PRN
  Administered 2023-10-11: 60 mg via INTRAVENOUS

## 2023-10-11 MED ORDER — CEFAZOLIN SODIUM-DEXTROSE 2-4 GM/100ML-% IV SOLN
2.0000 g | Freq: Three times a day (TID) | INTRAVENOUS | Status: AC
  Administered 2023-10-11 – 2023-10-12 (×3): 2 g via INTRAVENOUS
  Filled 2023-10-11 (×3): qty 100

## 2023-10-11 MED ORDER — METHOCARBAMOL 1000 MG/10ML IJ SOLN: 1000.0000 mg | Freq: Three times a day (TID) | INTRAMUSCULAR | Status: AC

## 2023-10-11 MED ORDER — OXYCODONE HCL 5 MG PO TABS: 5.0000 mg | ORAL_TABLET | Freq: Once | ORAL | Status: DC | PRN

## 2023-10-11 MED ORDER — OXYCODONE HCL 5 MG/5ML PO SOLN: 5.0000 mg | Freq: Once | ORAL | Status: DC | PRN

## 2023-10-11 MED ORDER — DIPHENHYDRAMINE HCL 50 MG/ML IJ SOLN
12.5000 mg | Freq: Four times a day (QID) | INTRAMUSCULAR | Status: DC | PRN
  Administered 2023-10-11 – 2023-10-17 (×3): 12.5 mg via INTRAVENOUS
  Filled 2023-10-11 (×4): qty 1

## 2023-10-11 MED ORDER — POLYETHYLENE GLYCOL 3350 17 G PO PACK
17.0000 g | PACK | Freq: Two times a day (BID) | ORAL | Status: DC
  Administered 2023-10-11: 17 g via ORAL
  Filled 2023-10-11 (×2): qty 1

## 2023-10-11 MED ORDER — METHOCARBAMOL 500 MG PO TABS: 1000.0000 mg | ORAL_TABLET | Freq: Three times a day (TID) | ORAL | Status: AC

## 2023-10-11 MED ORDER — FENTANYL CITRATE (PF) 250 MCG/5ML IJ SOLN
INTRAMUSCULAR | Status: AC
  Filled 2023-10-11: qty 5

## 2023-10-11 MED ORDER — DEXAMETHASONE SODIUM PHOSPHATE 10 MG/ML IJ SOLN
INTRAMUSCULAR | Status: DC | PRN
  Administered 2023-10-11: 5 mg via INTRAVENOUS

## 2023-10-11 MED ORDER — ALBUMIN HUMAN 5 % IV SOLN
INTRAVENOUS | Status: DC | PRN
Start: 2023-10-11 — End: 2023-10-11

## 2023-10-11 MED ORDER — PHENYLEPHRINE 80 MCG/ML (10ML) SYRINGE FOR IV PUSH (FOR BLOOD PRESSURE SUPPORT)
PREFILLED_SYRINGE | INTRAVENOUS | Status: DC | PRN
  Administered 2023-10-11 (×3): 160 ug via INTRAVENOUS

## 2023-10-11 MED ORDER — ACETAMINOPHEN 10 MG/ML IV SOLN
1000.0000 mg | Freq: Four times a day (QID) | INTRAVENOUS | Status: AC
  Administered 2023-10-11 (×2): 1000 mg via INTRAVENOUS
  Filled 2023-10-11 (×2): qty 100

## 2023-10-11 MED ORDER — CHLORHEXIDINE GLUCONATE 0.12 % MT SOLN: 15.0000 mL | Freq: Once | OROMUCOSAL | Status: DC

## 2023-10-11 MED ORDER — IPRATROPIUM-ALBUTEROL 0.5-2.5 (3) MG/3ML IN SOLN
3.0000 mL | RESPIRATORY_TRACT | Status: AC
  Administered 2023-10-11: 3 mL via RESPIRATORY_TRACT
  Filled 2023-10-11: qty 3

## 2023-10-11 MED ORDER — FENTANYL CITRATE (PF) 100 MCG/2ML IJ SOLN
INTRAMUSCULAR | Status: AC
  Filled 2023-10-11: qty 2

## 2023-10-11 MED ORDER — FENTANYL CITRATE (PF) 100 MCG/2ML IJ SOLN
25.0000 ug | INTRAMUSCULAR | Status: DC | PRN
  Administered 2023-10-11 (×2): 25 ug via INTRAVENOUS

## 2023-10-11 SURGICAL SUPPLY — 54 items
BAG COUNTER SPONGE SURGICOUNT (BAG) ×3 IMPLANT
BIT DRILL 2.3 QUICK RELEASE (BIT) IMPLANT
BIT DRILL 4.8X300 (BIT) IMPLANT
BNDG COHESIVE 4X5 TAN STRL LF (GAUZE/BANDAGES/DRESSINGS) IMPLANT
BRUSH SCRUB EZ PLAIN DRY (MISCELLANEOUS) ×6 IMPLANT
COVER SURGICAL LIGHT HANDLE (MISCELLANEOUS) ×6 IMPLANT
DRAPE C-ARM 42X72 X-RAY (DRAPES) ×3 IMPLANT
DRAPE INCISE IOBAN 66X45 STRL (DRAPES) ×3 IMPLANT
DRAPE LAPAROTOMY TRNSV 102X78 (DRAPES) ×3 IMPLANT
DRAPE SURG ORHT 6 SPLT 77X108 (DRAPES) IMPLANT
DRAPE U-SHAPE 47X51 STRL (DRAPES) ×6 IMPLANT
DRESSING MEPILEX FLEX 4X4 (GAUZE/BANDAGES/DRESSINGS) IMPLANT
DRSG MEPILEX POST OP 4X8 (GAUZE/BANDAGES/DRESSINGS) ×3 IMPLANT
DRSG MEPITEL 3X4 ME34 (GAUZE/BANDAGES/DRESSINGS) IMPLANT
ELECTRODE REM PT RTRN 9FT ADLT (ELECTROSURGICAL) ×3 IMPLANT
GAUZE SPONGE 4X4 12PLY STRL (GAUZE/BANDAGES/DRESSINGS) IMPLANT
GLOVE BIO SURGEON STRL SZ7.5 (GLOVE) ×3 IMPLANT
GLOVE BIO SURGEON STRL SZ8 (GLOVE) ×3 IMPLANT
GLOVE BIOGEL PI IND STRL 7.5 (GLOVE) ×3 IMPLANT
GLOVE BIOGEL PI IND STRL 8 (GLOVE) ×3 IMPLANT
GLOVE SURG ORTHO LTX SZ7.5 (GLOVE) ×6 IMPLANT
GOWN STRL REUS W/ TWL LRG LVL3 (GOWN DISPOSABLE) ×6 IMPLANT
GOWN STRL REUS W/ TWL XL LVL3 (GOWN DISPOSABLE) ×3 IMPLANT
KIT BASIN OR (CUSTOM PROCEDURE TRAY) ×3 IMPLANT
KIT TURNOVER KIT B (KITS) ×3 IMPLANT
MANIFOLD NEPTUNE II (INSTRUMENTS) ×3 IMPLANT
NDL HYPO 25GX1X1/2 BEV (NEEDLE) IMPLANT
NEEDLE HYPO 25GX1X1/2 BEV (NEEDLE) IMPLANT
NS IRRIG 1000ML POUR BTL (IV SOLUTION) ×3 IMPLANT
PACK GENERAL/GYN (CUSTOM PROCEDURE TRAY) ×3 IMPLANT
PAD ARMBOARD POSITIONER FOAM (MISCELLANEOUS) ×6 IMPLANT
PIN GUIDE DRIL TIP 2.8X300 STE (PIN) IMPLANT
PLATE LEFT CLAVICLE 8H (Plate) IMPLANT
SCREW BONE LOCK 3.0X12MM HEXA (Screw) IMPLANT
SCREW BONE NL 3.0X16 HEXA (Screw) IMPLANT
SCREW CANN 8X140X40 (Screw) IMPLANT
SCREW CANN 8X165X40 (Screw) IMPLANT
SCREW NON LOCK 3.0X12 (Screw) IMPLANT
SCREW NON LOCK 3.0X14 (Screw) IMPLANT
SLING ARM IMMOBILIZER LRG (SOFTGOODS) IMPLANT
SLING ARM IMMOBILIZER MED (SOFTGOODS) IMPLANT
STAPLER SKIN PROX 35W (STAPLE) ×3 IMPLANT
STOCKINETTE IMPERVIOUS 9X36 MD (GAUZE/BANDAGES/DRESSINGS) IMPLANT
STRIP CLOSURE SKIN 1/2X4 (GAUZE/BANDAGES/DRESSINGS) ×3 IMPLANT
SUCTION TUBE FRAZIER 10FR DISP (SUCTIONS) ×3 IMPLANT
SUT ETHILON 3 0 PS 1 (SUTURE) IMPLANT
SUT MNCRL AB 3-0 PS2 27 (SUTURE) ×3 IMPLANT
SUT VIC AB 0 CT1 27XBRD ANBCTR (SUTURE) ×3 IMPLANT
SUT VIC AB 2-0 CT1 TAPERPNT 27 (SUTURE) ×3 IMPLANT
SYR CONTROL 10ML LL (SYRINGE) IMPLANT
TAPE CLOTH SOFT 2X10 (GAUZE/BANDAGES/DRESSINGS) IMPLANT
TOWEL GREEN STERILE (TOWEL DISPOSABLE) ×3 IMPLANT
TOWEL GREEN STERILE FF (TOWEL DISPOSABLE) ×6 IMPLANT
WATER STERILE IRR 1000ML POUR (IV SOLUTION) ×3 IMPLANT

## 2023-10-11 NOTE — Transfer of Care (Signed)
 Immediate Anesthesia Transfer of Care Note  Patient: [name]  Procedure(s) Performed: OPEN REDUCTION INTERNAL FIXATION (ORIF) CLAVICULAR FRACTURE (Left: Chest) TRANS-SACRAL SACROILIAC SCREW LEFT TO RIGHT (Left: Hip) STRESS EVALUATION KNEE, CLOSED (Right: Knee)  Patient Location: PACU  Anesthesia Type:General  Level of Consciousness: awake, drowsy, and patient cooperative  Airway & Oxygen Therapy: Patient Spontanous Breathing and Patient connected to face mask oxygen  Post-op Assessment: Report given to RN, Post -op Vital signs reviewed and stable, and Patient moving all extremities  Post vital signs: Reviewed and stable  Last Vitals:  Vitals Value Taken Time  BP 133/58 10/11/23 1351  Temp    Pulse 68 10/11/23 1356  Resp 10 10/11/23 1357  SpO2 89 % 10/11/23 1356  Vitals shown include unfiled device data.  Last Pain:  Vitals:   10/11/23 1045  TempSrc: Oral  PainSc:          Complications: No notable events documented.
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
## 2023-10-11 NOTE — Anesthesia Procedure Notes (Signed)
 Procedure Name: Intubation Date/Time: 10/11/2023 11:45 AM  Performed by: Hebert Littler, CRNAPre-anesthesia Checklist: Patient identified, Emergency Drugs available, Suction available, Patient being monitored and Timeout performed Oxygen Delivery Method: Circle system utilized Preoxygenation: Pre-oxygenation with 100% oxygen Induction Type: IV induction Ventilation: Mask ventilation without difficulty Laryngoscope Size: Glidescope and 3 Grade View: Grade I Tube size: 6.5 mm Number of attempts: 1 Airway Equipment and Method: Patient positioned with wedge pillow, Stylet and Video-laryngoscopy Placement Confirmation: ETT inserted through vocal cords under direct vision, positive ETCO2, CO2 detector and breath sounds checked- equal and bilateral Secured at: 22 cm Tube secured with: Tape
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
## 2023-10-11 NOTE — Care Management Important Message (Signed)
 Important Message  Patient Details  Name: [name] MRN: [id number] Date of Birth: February 22, 1932   Important Message Given:  Yes - Medicare IM     Felix Host 10/11/2023, 11:22 AM
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
## 2023-10-11 NOTE — Anesthesia Postprocedure Evaluation (Signed)
 Anesthesia Post Note  Patient: [name]  Procedure(s) Performed: OPEN REDUCTION INTERNAL FIXATION (ORIF) CLAVICULAR FRACTURE (Left: Chest) TRANS-SACRAL SACROILIAC SCREW LEFT TO RIGHT (Left: Hip) STRESS EVALUATION KNEE, CLOSED (Right: Knee)     Patient location during evaluation: PACU Anesthesia Type: General Level of consciousness: awake and alert Pain management: pain level controlled Vital Signs Assessment: post-procedure vital signs reviewed and stable Respiratory status: spontaneous breathing, nonlabored ventilation, respiratory function stable and patient connected to nasal cannula oxygen Cardiovascular status: blood pressure returned to baseline and stable Postop Assessment: no apparent nausea or vomiting Anesthetic complications: no   No notable events documented.  Last Vitals:  Vitals:   10/11/23 1400 10/11/23 1434  BP: (!) 160/68   Pulse:  83  Resp: 10 14  Temp:    SpO2:  92%    Last Pain:  Vitals:   10/11/23 1351  TempSrc:   PainSc: Asleep                 Lethaniel Rave

## 2023-10-11 NOTE — Progress Notes (Signed)
 Pt had a 9 beats of VTACH per cardiac monitoring. The pt is asymptomatic. RN notified Dr. Aniceto Barley. VS were taken.

## 2023-10-11 NOTE — Progress Notes (Signed)
 Sent transporter at [phone] to bring patient to pre-op. Transporter called short stay inpatient charge nurse stating daughter did not want patient to come down until she spoke with the doctor. Explained to transporter that surgeon was currently in the OR but would speak with family in pre-op prior to patient going in for surgery today. Transporter relayed message to daughter and still refusing to leave the floor at this time. Tiffany Sicks, PA at beside speaking with family at this time. Transporter will attempt to bring patient down when [name] is finished talking with the family.

## 2023-10-11 NOTE — Progress Notes (Signed)
 OT Cancellation Note  Patient Details Name: [name] MRN: [id number] DOB: 17-Nov-1931   Cancelled Treatment:    Reason Eval/Treat Not Completed: Patient at procedure or test/ unavailable (Patient off unit for surgery. OT to reattempt on later date)  Jovita Nipper 10/11/2023, 10:42 AM  Anitra Barn, OTA Acute Rehabilitation Services  Office [phone]

## 2023-10-12 LAB — MAGNESIUM: Magnesium: 2 mg/dL (ref 1.7–2.4)

## 2023-10-12 LAB — CBC
HCT: 19.9 % — ABNORMAL LOW (ref 36.0–46.0)
HCT: 25 % — ABNORMAL LOW (ref 36.0–46.0)
Hemoglobin: 6.7 g/dL — CL (ref 12.0–15.0)
Hemoglobin: 8.3 g/dL — ABNORMAL LOW (ref 12.0–15.0)
MCH: 34.4 pg — ABNORMAL HIGH (ref 26.0–34.0)
MCH: 32 pg (ref 26.0–34.0)
MCHC: 33.7 g/dL (ref 30.0–36.0)
MCHC: 33.2 g/dL (ref 30.0–36.0)
MCV: 102.1 fL — ABNORMAL HIGH (ref 80.0–100.0)
MCV: 96.5 fL (ref 80.0–100.0)
Platelets: 135 10*3/uL — ABNORMAL LOW (ref 150–400)
Platelets: 143 10*3/uL — ABNORMAL LOW (ref 150–400)
RBC: 1.95 MIL/uL — ABNORMAL LOW (ref 3.87–5.11)
RBC: 2.59 MIL/uL — ABNORMAL LOW (ref 3.87–5.11)
RDW: 14.1 % (ref 11.5–15.5)
RDW: 17.1 % — ABNORMAL HIGH (ref 11.5–15.5)
WBC: 10.4 10*3/uL (ref 4.0–10.5)
WBC: 10.4 10*3/uL (ref 4.0–10.5)
nRBC: 0.3 % — ABNORMAL HIGH (ref 0.0–0.2)
nRBC: 0 % (ref 0.0–0.2)

## 2023-10-12 LAB — BASIC METABOLIC PANEL WITH GFR
Anion gap: 8 (ref 5–15)
BUN: 19 mg/dL (ref 8–23)
CO2: 20 mmol/L — ABNORMAL LOW (ref 22–32)
Calcium: 8.1 mg/dL — ABNORMAL LOW (ref 8.9–10.3)
Chloride: 103 mmol/L (ref 98–111)
Creatinine, Ser: 0.72 mg/dL (ref 0.44–1.00)
GFR, Estimated: >60 mL/min (ref >60)
Glucose, Bld: 101 mg/dL — ABNORMAL HIGH (ref 70–99)
Potassium: 4.5 mmol/L (ref 3.5–5.1)
Sodium: 131 mmol/L — ABNORMAL LOW (ref 135–145)

## 2023-10-12 LAB — PROTIME-INR
INR: 1.1 (ref 0.8–1.2)
Prothrombin Time: 14.1 s (ref 11.4–15.2)

## 2023-10-12 LAB — PREPARE RBC (CROSSMATCH)

## 2023-10-12 LAB — APTT: aPTT: 29 s (ref 24–36)

## 2023-10-12 MED ORDER — SODIUM CHLORIDE 0.9% IV SOLUTION: Freq: Once | INTRAVENOUS | Status: AC

## 2023-10-12 MED ORDER — POLYETHYLENE GLYCOL 3350 17 G PO PACK
17.0000 g | PACK | Freq: Three times a day (TID) | ORAL | Status: DC
  Administered 2023-10-12 – 2023-10-16 (×12): 17 g via ORAL
  Filled 2023-10-12 (×13): qty 1

## 2023-10-12 MED ORDER — TRANEXAMIC ACID 1000 MG/10ML IV SOLN
2000.0000 mg | Freq: Once | INTRAVENOUS | Status: AC
  Administered 2023-10-12: 2000 mg via TOPICAL
  Filled 2023-10-12: qty 20

## 2023-10-12 MED ORDER — THROMBIN 20000 UNITS EX KIT
20000.0000 [IU] | PACK | Freq: Once | CUTANEOUS | Status: AC
  Administered 2023-10-12: 20000 [IU] via TOPICAL
  Filled 2023-10-12 (×2): qty 1

## 2023-10-12 MED ORDER — MAGNESIUM HYDROXIDE 400 MG/5ML PO SUSP
15.0000 mL | Freq: Every day | ORAL | Status: DC
  Administered 2023-10-12 – 2023-10-17 (×5): 15 mL via ORAL
  Filled 2023-10-12 (×5): qty 30

## 2023-10-12 MED ORDER — POLYVINYL ALCOHOL 1.4 % OP SOLN
1.0000 [drp] | OPHTHALMIC | Status: DC | PRN
  Administered 2023-10-12: 1 [drp] via OPHTHALMIC
  Filled 2023-10-12: qty 15

## 2023-10-12 MED ORDER — BISACODYL 5 MG PO TBEC
5.0000 mg | DELAYED_RELEASE_TABLET | Freq: Every day | ORAL | Status: DC | PRN
  Administered 2023-10-12: 5 mg via ORAL
  Filled 2023-10-12: qty 1

## 2023-10-12 MED ORDER — FUROSEMIDE 10 MG/ML IJ SOLN
20.0000 mg | Freq: Once | INTRAMUSCULAR | Status: AC
  Administered 2023-10-12: 20 mg via INTRAVENOUS
  Filled 2023-10-12: qty 2

## 2023-10-12 MED ORDER — CALCIUM GLUCONATE-NACL 1-0.675 GM/50ML-% IV SOLN
1.0000 g | Freq: Once | INTRAVENOUS | Status: AC
  Administered 2023-10-12: 1000 mg via INTRAVENOUS
  Filled 2023-10-12: qty 50
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
## 2023-10-12 NOTE — Plan of Care (Signed)
   Problem: Education: Goal: Knowledge of General Education information will improve Description: Including pain rating scale, medication(s)/side effects and non-pharmacologic comfort measures Outcome: Progressing   Problem: Safety: Goal: Ability to remain free from injury will improve Outcome: Progressing

## 2023-10-12 NOTE — Progress Notes (Signed)
Blood transfusion complete with no complications.
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
## 2023-10-12 NOTE — Progress Notes (Signed)
 Pt requested Benadryl  for itching. Per pt and family member, she was itching , Benadryl  12.5 mg PRN itching administered as ordered. Per pt's daughter, pt was not making sense any more. MD notified. MD stated that pt has been going in and out of confusion since admission.  MD advised to keep monitoring pt. Will continue monitoring.
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
## 2023-10-12 NOTE — Progress Notes (Signed)
 Pt signed blood consent form, RN witnessed consent. Blood transfusion consent form placed in pt chart.

## 2023-10-12 NOTE — Progress Notes (Signed)
   10/12/23 1130  Provider Notification  Provider Name/Title Starr Eddy, PA with Ortho  Date Provider Notified 10/12/23  Time Provider Notified 1058  Method of Notification Page  Notification Reason Other (Comment) (pt bleeding from Surgical site to left clavicle.)  Provider response En route;No new orders  Date of Provider Response 10/12/23  Time of Provider Response 1058   Pt bleeding through left clavicle dressing. Drsg reinforced. VSS. Pt denies feeling lightheaded or dizzy. Starr Eddy, PA with ortho notified and at beside.

## 2023-10-13 LAB — TYPE AND SCREEN
ABO/RH(D): O POS
Antibody Screen: NEGATIVE
Unit division: 0

## 2023-10-13 LAB — CBC
HCT: 23.3 % — ABNORMAL LOW (ref 36.0–46.0)
Hemoglobin: 7.8 g/dL — ABNORMAL LOW (ref 12.0–15.0)
MCH: 32.6 pg (ref 26.0–34.0)
MCHC: 33.5 g/dL (ref 30.0–36.0)
MCV: 97.5 fL (ref 80.0–100.0)
Platelets: 172 10*3/uL (ref 150–400)
RBC: 2.39 MIL/uL — ABNORMAL LOW (ref 3.87–5.11)
RDW: 17 % — ABNORMAL HIGH (ref 11.5–15.5)
WBC: 13.5 10*3/uL — ABNORMAL HIGH (ref 4.0–10.5)
nRBC: 0.1 % (ref 0.0–0.2)

## 2023-10-13 LAB — BASIC METABOLIC PANEL WITH GFR
Anion gap: 9 (ref 5–15)
BUN: 23 mg/dL (ref 8–23)
CO2: 20 mmol/L — ABNORMAL LOW (ref 22–32)
Calcium: 8.1 mg/dL — ABNORMAL LOW (ref 8.9–10.3)
Chloride: 102 mmol/L (ref 98–111)
Creatinine, Ser: 0.73 mg/dL (ref 0.44–1.00)
GFR, Estimated: >60 mL/min (ref >60)
Glucose, Bld: 120 mg/dL — ABNORMAL HIGH (ref 70–99)
Potassium: 4.2 mmol/L (ref 3.5–5.1)
Sodium: 131 mmol/L — ABNORMAL LOW (ref 135–145)

## 2023-10-13 LAB — BPAM RBC
Blood Product Expiration Date: 202506132359
ISSUE DATE / TIME: 202506061018
Unit Type and Rh: 5100

## 2023-10-13 LAB — VITAMIN D 25 HYDROXY (VIT D DEFICIENCY, FRACTURES): Vit D, 25-Hydroxy: 56.82 ng/mL (ref 30–100)

## 2023-10-13 LAB — MAGNESIUM: Magnesium: 2 mg/dL (ref 1.7–2.4)

## 2023-10-13 MED ORDER — ENSURE PLUS HIGH PROTEIN PO LIQD
237.0000 mL | Freq: Two times a day (BID) | ORAL | Status: DC
  Administered 2023-10-13 – 2023-10-22 (×9): 237 mL via ORAL
  Filled 2023-10-13: qty 237
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
## 2023-10-13 NOTE — Plan of Care (Signed)
  Problem: Education: Goal: Knowledge of General Education information will improve Description: Including pain rating scale, medication(s)/side effects and non-pharmacologic comfort measures 10/13/2023 0026 by Alejos Husband, RN Outcome: Progressing 10/12/2023 1947 by Alejos Husband, RN Outcome: Progressing   Problem: Health Behavior/Discharge Planning: Goal: Ability to manage health-related needs will improve 10/13/2023 0026 by Alejos Husband, RN Outcome: Progressing 10/12/2023 1947 by Alejos Husband, RN Outcome: Progressing   Problem: Clinical Measurements: Goal: Ability to maintain clinical measurements within normal limits will improve 10/13/2023 0026 by Alejos Husband, RN Outcome: Progressing 10/12/2023 1947 by Alejos Husband, RN Outcome: Progressing Goal: Will remain free from infection 10/13/2023 0026 by Alejos Husband, RN Outcome: Progressing 10/12/2023 1947 by Alejos Husband, RN Outcome: Progressing Goal: Diagnostic test results will improve 10/13/2023 0026 by Alejos Husband, RN Outcome: Progressing 10/12/2023 1947 by Alejos Husband, RN Outcome: Progressing Goal: Respiratory complications will improve 10/13/2023 0026 by Alejos Husband, RN Outcome: Progressing 10/12/2023 1947 by Alejos Husband, RN Outcome: Progressing Goal: Cardiovascular complication will be avoided 10/13/2023 0026 by Alejos Husband, RN Outcome: Progressing 10/12/2023 1947 by Alejos Husband, RN Outcome: Progressing   Problem: Activity: Goal: Risk for activity intolerance will decrease 10/13/2023 0026 by Alejos Husband, RN Outcome: Progressing 10/12/2023 1947 by Alejos Husband, RN Outcome: Progressing   Problem: Nutrition: Goal: Adequate nutrition will be maintained 10/13/2023 0026 by Alejos Husband, RN Outcome: Progressing 10/12/2023 1947 by Alejos Husband, RN Outcome: Progressing   Problem: Coping: Goal: Level of anxiety will decrease 10/13/2023  0026 by Alejos Husband, RN Outcome: Progressing 10/12/2023 1947 by Alejos Husband, RN Outcome: Progressing   Problem: Elimination: Goal: Will not experience complications related to bowel motility 10/13/2023 0026 by Alejos Husband, RN Outcome: Progressing 10/12/2023 1947 by Alejos Husband, RN Outcome: Progressing Goal: Will not experience complications related to urinary retention 10/13/2023 0026 by Alejos Husband, RN Outcome: Progressing 10/12/2023 1947 by Alejos Husband, RN Outcome: Progressing   Problem: Pain Managment: Goal: General experience of comfort will improve and/or be controlled 10/13/2023 0026 by Alejos Husband, RN Outcome: Progressing 10/12/2023 1947 by Alejos Husband, RN Outcome: Progressing   Problem: Safety: Goal: Ability to remain free from injury will improve 10/13/2023 0026 by Alejos Husband, RN Outcome: Progressing 10/12/2023 1947 by Alejos Husband, RN Outcome: Progressing   Problem: Skin Integrity: Goal: Risk for impaired skin integrity will decrease 10/13/2023 0026 by Alejos Husband, RN Outcome: Progressing 10/12/2023 1947 by Alejos Husband, RN Outcome: Progressing

## 2023-10-13 NOTE — Plan of Care (Signed)

## 2023-10-14 LAB — BASIC METABOLIC PANEL WITH GFR
Anion gap: 10 (ref 5–15)
BUN: 20 mg/dL (ref 8–23)
CO2: 20 mmol/L — ABNORMAL LOW (ref 22–32)
Calcium: 8 mg/dL — ABNORMAL LOW (ref 8.9–10.3)
Chloride: 101 mmol/L (ref 98–111)
Creatinine, Ser: 0.59 mg/dL (ref 0.44–1.00)
GFR, Estimated: >60 mL/min (ref >60)
Glucose, Bld: 172 mg/dL — ABNORMAL HIGH (ref 70–99)
Potassium: 4.5 mmol/L (ref 3.5–5.1)
Sodium: 131 mmol/L — ABNORMAL LOW (ref 135–145)

## 2023-10-14 LAB — CBC
HCT: 21.9 % — ABNORMAL LOW (ref 36.0–46.0)
Hemoglobin: 7.3 g/dL — ABNORMAL LOW (ref 12.0–15.0)
MCH: 33.5 pg (ref 26.0–34.0)
MCHC: 33.3 g/dL (ref 30.0–36.0)
MCV: 100.5 fL — ABNORMAL HIGH (ref 80.0–100.0)
Platelets: 169 10*3/uL (ref 150–400)
RBC: 2.18 MIL/uL — ABNORMAL LOW (ref 3.87–5.11)
RDW: 16.4 % — ABNORMAL HIGH (ref 11.5–15.5)
WBC: 12.3 10*3/uL — ABNORMAL HIGH (ref 4.0–10.5)
nRBC: 0 % (ref 0.0–0.2)

## 2023-10-14 MED ORDER — KETOROLAC TROMETHAMINE 15 MG/ML IJ SOLN
15.0000 mg | Freq: Three times a day (TID) | INTRAMUSCULAR | Status: AC
  Administered 2023-10-14 – 2023-10-17 (×9): 15 mg via INTRAVENOUS
  Filled 2023-10-14 (×9): qty 1

## 2023-10-14 MED ORDER — ADULT MULTIVITAMIN W/MINERALS CH
1.0000 | ORAL_TABLET | Freq: Every day | ORAL | Status: DC
  Administered 2023-10-14 – 2023-10-22 (×9): 1 via ORAL
  Filled 2023-10-14 (×9): qty 1

## 2023-10-14 MED ORDER — PHENAZOPYRIDINE HCL 200 MG PO TABS
200.0000 mg | ORAL_TABLET | Freq: Every evening | ORAL | Status: DC
  Administered 2023-10-14 – 2023-10-15 (×2): 200 mg via ORAL
  Filled 2023-10-14 (×2): qty 1

## 2023-10-14 MED ORDER — ACETAMINOPHEN 500 MG PO TABS
1000.0000 mg | ORAL_TABLET | Freq: Three times a day (TID) | ORAL | Status: DC
  Administered 2023-10-14 – 2023-10-22 (×24): 1000 mg via ORAL
  Filled 2023-10-14 (×25): qty 2
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
## 2023-10-14 NOTE — Progress Notes (Signed)
 Patient oxygen turned down to 2L to wean patient off. Patient tolerating well. No SOB. Patient oxygen saturation between 95-97%. Jenetta Misty, RN

## 2023-10-14 NOTE — Plan of Care (Signed)

## 2023-10-14 NOTE — Plan of Care (Signed)
  Problem: Education: Goal: Knowledge of General Education information will improve Description: Including pain rating scale, medication(s)/side effects and non-pharmacologic comfort measures 10/14/2023 0429 by Alejos Husband, RN Outcome: Progressing 10/13/2023 1959 by Alejos Husband, RN Outcome: Progressing   Problem: Health Behavior/Discharge Planning: Goal: Ability to manage health-related needs will improve 10/14/2023 0429 by Alejos Husband, RN Outcome: Progressing 10/13/2023 1959 by Alejos Husband, RN Outcome: Progressing   Problem: Clinical Measurements: Goal: Ability to maintain clinical measurements within normal limits will improve 10/14/2023 0429 by Alejos Husband, RN Outcome: Progressing 10/13/2023 1959 by Alejos Husband, RN Outcome: Progressing Goal: Will remain free from infection 10/14/2023 0429 by Alejos Husband, RN Outcome: Progressing 10/13/2023 1959 by Alejos Husband, RN Outcome: Progressing Goal: Diagnostic test results will improve 10/14/2023 0429 by Alejos Husband, RN Outcome: Progressing 10/13/2023 1959 by Alejos Husband, RN Outcome: Progressing Goal: Respiratory complications will improve 10/14/2023 0429 by Alejos Husband, RN Outcome: Progressing 10/13/2023 1959 by Alejos Husband, RN Outcome: Progressing Goal: Cardiovascular complication will be avoided 10/14/2023 0429 by Alejos Husband, RN Outcome: Progressing 10/13/2023 1959 by Alejos Husband, RN Outcome: Progressing   Problem: Activity: Goal: Risk for activity intolerance will decrease 10/14/2023 0429 by Alejos Husband, RN Outcome: Progressing 10/13/2023 1959 by Alejos Husband, RN Outcome: Progressing   Problem: Nutrition: Goal: Adequate nutrition will be maintained 10/14/2023 0429 by Alejos Husband, RN Outcome: Progressing 10/13/2023 1959 by Alejos Husband, RN Outcome: Progressing   Problem: Coping: Goal: Level of anxiety will decrease 10/14/2023  0429 by Alejos Husband, RN Outcome: Progressing 10/13/2023 1959 by Alejos Husband, RN Outcome: Progressing   Problem: Elimination: Goal: Will not experience complications related to bowel motility 10/14/2023 0429 by Alejos Husband, RN Outcome: Progressing 10/13/2023 1959 by Alejos Husband, RN Outcome: Progressing Goal: Will not experience complications related to urinary retention 10/14/2023 0429 by Alejos Husband, RN Outcome: Progressing 10/13/2023 1959 by Alejos Husband, RN Outcome: Progressing   Problem: Pain Managment: Goal: General experience of comfort will improve and/or be controlled 10/14/2023 0429 by Alejos Husband, RN Outcome: Progressing 10/13/2023 1959 by Alejos Husband, RN Outcome: Progressing   Problem: Safety: Goal: Ability to remain free from injury will improve 10/14/2023 0429 by Alejos Husband, RN Outcome: Progressing 10/13/2023 1959 by Alejos Husband, RN Outcome: Progressing   Problem: Skin Integrity: Goal: Risk for impaired skin integrity will decrease 10/14/2023 0429 by Alejos Husband, RN Outcome: Progressing 10/13/2023 1959 by Alejos Husband, RN Outcome: Progressing

## 2023-10-15 ENCOUNTER — Encounter (HOSPITAL_COMMUNITY): Payer: Self-pay | Admitting: Orthopedic Surgery

## 2023-10-15 LAB — BASIC METABOLIC PANEL WITH GFR
Anion gap: 7 (ref 5–15)
Anion gap: 11 (ref 5–15)
BUN: 31 mg/dL — ABNORMAL HIGH (ref 8–23)
BUN: 25 mg/dL — ABNORMAL HIGH (ref 8–23)
CO2: 21 mmol/L — ABNORMAL LOW (ref 22–32)
CO2: 23 mmol/L (ref 22–32)
Calcium: 8.2 mg/dL — ABNORMAL LOW (ref 8.9–10.3)
Calcium: 8.6 mg/dL — ABNORMAL LOW (ref 8.9–10.3)
Chloride: 101 mmol/L (ref 98–111)
Chloride: 97 mmol/L — ABNORMAL LOW (ref 98–111)
Creatinine, Ser: 0.7 mg/dL (ref 0.44–1.00)
Creatinine, Ser: 0.71 mg/dL (ref 0.44–1.00)
GFR, Estimated: >60 mL/min (ref >60)
GFR, Estimated: >60 mL/min (ref >60)
Glucose, Bld: 105 mg/dL — ABNORMAL HIGH (ref 70–99)
Glucose, Bld: 131 mg/dL — ABNORMAL HIGH (ref 70–99)
Potassium: 5.4 mmol/L — ABNORMAL HIGH (ref 3.5–5.1)
Potassium: 5.3 mmol/L — ABNORMAL HIGH (ref 3.5–5.1)
Sodium: 129 mmol/L — ABNORMAL LOW (ref 135–145)
Sodium: 131 mmol/L — ABNORMAL LOW (ref 135–145)

## 2023-10-15 LAB — CBC
HCT: 20.1 % — ABNORMAL LOW (ref 36.0–46.0)
Hemoglobin: 6.5 g/dL — CL (ref 12.0–15.0)
MCH: 33 pg (ref 26.0–34.0)
MCHC: 32.3 g/dL (ref 30.0–36.0)
MCV: 102 fL — ABNORMAL HIGH (ref 80.0–100.0)
Platelets: 173 10*3/uL (ref 150–400)
RBC: 1.97 MIL/uL — ABNORMAL LOW (ref 3.87–5.11)
RDW: 16.2 % — ABNORMAL HIGH (ref 11.5–15.5)
WBC: 12 10*3/uL — ABNORMAL HIGH (ref 4.0–10.5)
nRBC: 0.2 % (ref 0.0–0.2)

## 2023-10-15 LAB — PREPARE RBC (CROSSMATCH)

## 2023-10-15 LAB — MAGNESIUM: Magnesium: 2.3 mg/dL (ref 1.7–2.4)

## 2023-10-15 MED ORDER — SODIUM CHLORIDE 1 G PO TABS
2.0000 g | ORAL_TABLET | Freq: Two times a day (BID) | ORAL | Status: DC
  Administered 2023-10-15 – 2023-10-17 (×4): 2 g via ORAL
  Filled 2023-10-15 (×4): qty 2

## 2023-10-15 MED ORDER — SODIUM CHLORIDE 0.9% IV SOLUTION: Freq: Once | INTRAVENOUS | Status: AC

## 2023-10-15 MED ORDER — SODIUM ZIRCONIUM CYCLOSILICATE 10 G PO PACK
10.0000 g | PACK | Freq: Once | ORAL | Status: AC
  Administered 2023-10-15: 10 g via ORAL
  Filled 2023-10-15: qty 1
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
## 2023-10-15 NOTE — Progress Notes (Signed)
 CRITICAL VALUE STICKER  CRITICAL VALUE: Hemoglobin 6.5  RECEIVER (on-site recipient of call): Francene Ing RN BSN   DATE & TIME NOTIFIED: 10/15/2023 at 0741  MESSENGER (representative from lab): Georgette Kins   MD NOTIFIED: Michial Akin PA-C / Trauma PA  TIME OF NOTIFICATION: [phone]  RESPONSE: See new orders.     10/15/23 6213  Provider Notification  Provider Name/Title Michial Akin PA-C/ Trauma PA  Date Provider Notified 10/15/23  Time Provider Notified [phone]  Method of Notification Page  Notification Reason Critical Result  Test performed and critical result Hgb 6.5  Date Critical Result Received 10/15/23  Time Critical Result Received 0741  Provider response See new orders  Date of Provider Response 10/15/23  Time of Provider Response [phone]

## 2023-10-15 NOTE — Plan of Care (Signed)
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
## 2023-10-15 NOTE — Plan of Care (Signed)
  Problem: Education: Goal: Knowledge of General Education information will improve Description: Including pain rating scale, medication(s)/side effects and non-pharmacologic comfort measures 10/15/2023 0146 by Alejos Husband, RN Outcome: Progressing 10/14/2023 2116 by Alejos Husband, RN Outcome: Progressing   Problem: Health Behavior/Discharge Planning: Goal: Ability to manage health-related needs will improve 10/15/2023 0146 by Alejos Husband, RN Outcome: Progressing 10/14/2023 2116 by Alejos Husband, RN Outcome: Progressing   Problem: Clinical Measurements: Goal: Ability to maintain clinical measurements within normal limits will improve 10/15/2023 0146 by Alejos Husband, RN Outcome: Progressing 10/14/2023 2116 by Alejos Husband, RN Outcome: Progressing Goal: Will remain free from infection 10/15/2023 0146 by Alejos Husband, RN Outcome: Progressing 10/14/2023 2116 by Alejos Husband, RN Outcome: Progressing Goal: Diagnostic test results will improve 10/15/2023 0146 by Alejos Husband, RN Outcome: Progressing 10/14/2023 2116 by Alejos Husband, RN Outcome: Progressing Goal: Respiratory complications will improve 10/15/2023 0146 by Alejos Husband, RN Outcome: Progressing 10/14/2023 2116 by Alejos Husband, RN Outcome: Progressing Goal: Cardiovascular complication will be avoided 10/15/2023 0146 by Alejos Husband, RN Outcome: Progressing 10/14/2023 2116 by Alejos Husband, RN Outcome: Progressing   Problem: Activity: Goal: Risk for activity intolerance will decrease 10/15/2023 0146 by Alejos Husband, RN Outcome: Progressing 10/14/2023 2116 by Alejos Husband, RN Outcome: Progressing   Problem: Nutrition: Goal: Adequate nutrition will be maintained 10/15/2023 0146 by Alejos Husband, RN Outcome: Progressing 10/14/2023 2116 by Alejos Husband, RN Outcome: Progressing   Problem: Coping: Goal: Level of anxiety will decrease 10/15/2023  0146 by Alejos Husband, RN Outcome: Progressing 10/14/2023 2116 by Alejos Husband, RN Outcome: Progressing   Problem: Elimination: Goal: Will not experience complications related to bowel motility 10/15/2023 0146 by Alejos Husband, RN Outcome: Progressing 10/14/2023 2116 by Alejos Husband, RN Outcome: Progressing Goal: Will not experience complications related to urinary retention 10/15/2023 0146 by Alejos Husband, RN Outcome: Progressing 10/14/2023 2116 by Alejos Husband, RN Outcome: Progressing   Problem: Pain Managment: Goal: General experience of comfort will improve and/or be controlled 10/15/2023 0146 by Alejos Husband, RN Outcome: Progressing 10/14/2023 2116 by Alejos Husband, RN Outcome: Progressing   Problem: Safety: Goal: Ability to remain free from injury will improve 10/15/2023 0146 by Alejos Husband, RN Outcome: Progressing 10/14/2023 2116 by Alejos Husband, RN Outcome: Progressing   Problem: Skin Integrity: Goal: Risk for impaired skin integrity will decrease 10/15/2023 0146 by Alejos Husband, RN Outcome: Progressing 10/14/2023 2116 by Alejos Husband, RN Outcome: Progressing
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
## 2023-10-15 NOTE — Progress Notes (Signed)
 Clarified with Dr. Aniceto Barley at bedside regarding second order to transfuse 1 unit PRBCs.  Dr. Aniceto Barley stated that she only wanted patient to receive 1 unit PRBCs total.   Spoke with blood bank to confirm that second unit of blood is not needed.   Per Dr. Lita Rieger order, water  pitcher removed from room and educated patient and family friend at bedside about plan of care today.

## 2023-10-16 ENCOUNTER — Other Ambulatory Visit: Payer: Self-pay

## 2023-10-16 LAB — TYPE AND SCREEN
ABO/RH(D): O POS
Antibody Screen: NEGATIVE
Unit division: 0

## 2023-10-16 LAB — BASIC METABOLIC PANEL WITH GFR
Anion gap: 9 (ref 5–15)
BUN: 23 mg/dL (ref 8–23)
CO2: 21 mmol/L — ABNORMAL LOW (ref 22–32)
Calcium: 8 mg/dL — ABNORMAL LOW (ref 8.9–10.3)
Chloride: 101 mmol/L (ref 98–111)
Creatinine, Ser: 0.63 mg/dL (ref 0.44–1.00)
GFR, Estimated: >60 mL/min (ref >60)
Glucose, Bld: 119 mg/dL — ABNORMAL HIGH (ref 70–99)
Potassium: 5.7 mmol/L — ABNORMAL HIGH (ref 3.5–5.1)
Sodium: 131 mmol/L — ABNORMAL LOW (ref 135–145)

## 2023-10-16 LAB — URINALYSIS, ROUTINE W REFLEX MICROSCOPIC
Bacteria, UA: NONE SEEN
Bilirubin Urine: NEGATIVE
Glucose, UA: NEGATIVE mg/dL
Hgb urine dipstick: NEGATIVE
Ketones, ur: NEGATIVE mg/dL
Leukocytes,Ua: NEGATIVE
Nitrite: POSITIVE — AB
Protein, ur: NEGATIVE mg/dL
Specific Gravity, Urine: 1.027 (ref 1.005–1.030)
pH: 5 (ref 5.0–8.0)

## 2023-10-16 LAB — CBC
HCT: 24.1 % — ABNORMAL LOW (ref 36.0–46.0)
Hemoglobin: 8 g/dL — ABNORMAL LOW (ref 12.0–15.0)
MCH: 32.4 pg (ref 26.0–34.0)
MCHC: 33.2 g/dL (ref 30.0–36.0)
MCV: 97.6 fL (ref 80.0–100.0)
Platelets: 244 10*3/uL (ref 150–400)
RBC: 2.47 MIL/uL — ABNORMAL LOW (ref 3.87–5.11)
RDW: 17.8 % — ABNORMAL HIGH (ref 11.5–15.5)
WBC: 12.3 10*3/uL — ABNORMAL HIGH (ref 4.0–10.5)
nRBC: 0 % (ref 0.0–0.2)

## 2023-10-16 LAB — BPAM RBC
Blood Product Expiration Date: 202507072359
ISSUE DATE / TIME: 202506091415
Unit Type and Rh: 5100

## 2023-10-16 MED ORDER — SODIUM ZIRCONIUM CYCLOSILICATE 10 G PO PACK
10.0000 g | PACK | Freq: Three times a day (TID) | ORAL | Status: DC
  Administered 2023-10-16 – 2023-10-17 (×4): 10 g via ORAL
  Filled 2023-10-16 (×4): qty 1

## 2023-10-16 MED ORDER — CARVEDILOL 12.5 MG PO TABS: 12.5000 mg | ORAL_TABLET | Freq: Two times a day (BID) | ORAL | Status: DC

## 2023-10-16 MED ORDER — CARVEDILOL 6.25 MG PO TABS
6.2500 mg | ORAL_TABLET | Freq: Two times a day (BID) | ORAL | Status: DC
  Administered 2023-10-16 – 2023-10-17 (×2): 6.25 mg via ORAL
  Filled 2023-10-16 (×2): qty 1

## 2023-10-16 MED ORDER — AMLODIPINE BESYLATE 5 MG PO TABS
5.0000 mg | ORAL_TABLET | Freq: Every day | ORAL | Status: DC
  Administered 2023-10-16 – 2023-10-22 (×7): 5 mg via ORAL
  Filled 2023-10-16 (×7): qty 1
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
## 2023-10-16 NOTE — Progress Notes (Signed)
 Foley cathter removed per Dr. Lita Rieger order.  Educated patient on use of external catheter to assist with collecting urine while not able to mobilize out of bed.

## 2023-10-16 NOTE — Plan of Care (Signed)
  Problem: Education: Goal: Knowledge of General Education information will improve Description: Including pain rating scale, medication(s)/side effects and non-pharmacologic comfort measures 10/16/2023 0511 by Alejos Husband, RN Outcome: Progressing 10/15/2023 2031 by Alejos Husband, RN Outcome: Progressing   Problem: Health Behavior/Discharge Planning: Goal: Ability to manage health-related needs will improve 10/16/2023 0511 by Alejos Husband, RN Outcome: Progressing 10/15/2023 2031 by Alejos Husband, RN Outcome: Progressing   Problem: Clinical Measurements: Goal: Ability to maintain clinical measurements within normal limits will improve 10/16/2023 0511 by Alejos Husband, RN Outcome: Progressing 10/15/2023 2031 by Alejos Husband, RN Outcome: Progressing Goal: Will remain free from infection 10/16/2023 0511 by Alejos Husband, RN Outcome: Progressing 10/15/2023 2031 by Alejos Husband, RN Outcome: Progressing Goal: Diagnostic test results will improve 10/16/2023 0511 by Alejos Husband, RN Outcome: Progressing 10/15/2023 2031 by Alejos Husband, RN Outcome: Progressing Goal: Respiratory complications will improve 10/16/2023 0511 by Alejos Husband, RN Outcome: Progressing 10/15/2023 2031 by Alejos Husband, RN Outcome: Progressing Goal: Cardiovascular complication will be avoided 10/16/2023 0511 by Alejos Husband, RN Outcome: Progressing 10/15/2023 2031 by Alejos Husband, RN Outcome: Progressing   Problem: Activity: Goal: Risk for activity intolerance will decrease 10/16/2023 0511 by Alejos Husband, RN Outcome: Progressing 10/15/2023 2031 by Alejos Husband, RN Outcome: Progressing   Problem: Nutrition: Goal: Adequate nutrition will be maintained 10/16/2023 0511 by Alejos Husband, RN Outcome: Progressing 10/15/2023 2031 by Alejos Husband, RN Outcome: Progressing   Problem: Coping: Goal: Level of anxiety will  decrease 10/16/2023 0511 by Alejos Husband, RN Outcome: Progressing 10/15/2023 2031 by Alejos Husband, RN Outcome: Progressing   Problem: Elimination: Goal: Will not experience complications related to bowel motility 10/16/2023 0511 by Alejos Husband, RN Outcome: Progressing 10/15/2023 2031 by Alejos Husband, RN Outcome: Progressing Goal: Will not experience complications related to urinary retention 10/16/2023 0511 by Alejos Husband, RN Outcome: Progressing 10/15/2023 2031 by Alejos Husband, RN Outcome: Progressing   Problem: Pain Managment: Goal: General experience of comfort will improve and/or be controlled 10/16/2023 0511 by Alejos Husband, RN Outcome: Progressing 10/15/2023 2031 by Alejos Husband, RN Outcome: Progressing   Problem: Safety: Goal: Ability to remain free from injury will improve 10/16/2023 0511 by Alejos Husband, RN Outcome: Progressing 10/15/2023 2031 by Alejos Husband, RN Outcome: Progressing   Problem: Skin Integrity: Goal: Risk for impaired skin integrity will decrease 10/16/2023 0511 by Alejos Husband, RN Outcome: Progressing 10/15/2023 2031 by Alejos Husband, RN Outcome: Progressing

## 2023-10-16 NOTE — Plan of Care (Signed)
 Alert, oriented. Multiple family members/ family friends at bedside intermittently.  Foley catheter removed today, remains due to void at this time.  Medicated for pain, see MAR for details.  Problem: Education: Goal: Knowledge of General Education information will improve Description: Including pain rating scale, medication(s)/side effects and non-pharmacologic comfort measures Outcome: Progressing   Problem: Health Behavior/Discharge Planning: Goal: Ability to manage health-related needs will improve Outcome: Progressing   Problem: Clinical Measurements: Goal: Ability to maintain clinical measurements within normal limits will improve Outcome: Progressing Goal: Will remain free from infection Outcome: Progressing Goal: Diagnostic test results will improve Outcome: Progressing Goal: Respiratory complications will improve Outcome: Progressing Goal: Cardiovascular complication will be avoided Outcome: Progressing
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
## 2023-10-17 LAB — BASIC METABOLIC PANEL WITH GFR
Anion gap: 7 (ref 5–15)
BUN: 14 mg/dL (ref 8–23)
CO2: 26 mmol/L (ref 22–32)
Calcium: 8 mg/dL — ABNORMAL LOW (ref 8.9–10.3)
Chloride: 95 mmol/L — ABNORMAL LOW (ref 98–111)
Creatinine, Ser: 0.6 mg/dL (ref 0.44–1.00)
GFR, Estimated: >60 mL/min (ref >60)
Glucose, Bld: 135 mg/dL — ABNORMAL HIGH (ref 70–99)
Potassium: 3.9 mmol/L (ref 3.5–5.1)
Sodium: 128 mmol/L — ABNORMAL LOW (ref 135–145)

## 2023-10-17 MED ORDER — CARVEDILOL 12.5 MG PO TABS
12.5000 mg | ORAL_TABLET | Freq: Two times a day (BID) | ORAL | Status: DC
  Administered 2023-10-17 – 2023-10-22 (×10): 12.5 mg via ORAL
  Filled 2023-10-17 (×10): qty 1

## 2023-10-17 MED ORDER — SODIUM CHLORIDE 1 G PO TABS
2.0000 g | ORAL_TABLET | Freq: Three times a day (TID) | ORAL | Status: DC
  Administered 2023-10-17 – 2023-10-22 (×13): 2 g via ORAL
  Filled 2023-10-17 (×15): qty 2

## 2023-10-17 NOTE — Progress Notes (Signed)
 Pt had more than 5 soft Bms this shift.
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
## 2023-10-17 NOTE — Plan of Care (Signed)

## 2023-10-18 LAB — BASIC METABOLIC PANEL WITH GFR
Anion gap: 6 (ref 5–15)
BUN: 15 mg/dL (ref 8–23)
CO2: 27 mmol/L (ref 22–32)
Calcium: 8.5 mg/dL — ABNORMAL LOW (ref 8.9–10.3)
Chloride: 99 mmol/L (ref 98–111)
Creatinine, Ser: 0.6 mg/dL (ref 0.44–1.00)
GFR, Estimated: >60 mL/min (ref >60)
Glucose, Bld: 106 mg/dL — ABNORMAL HIGH (ref 70–99)
Potassium: 5.2 mmol/L — ABNORMAL HIGH (ref 3.5–5.1)
Sodium: 132 mmol/L — ABNORMAL LOW (ref 135–145)

## 2023-10-18 MED ORDER — SODIUM ZIRCONIUM CYCLOSILICATE 10 G PO PACK
10.0000 g | PACK | Freq: Three times a day (TID) | ORAL | Status: DC
  Administered 2023-10-18 – 2023-10-19 (×5): 10 g via ORAL
  Filled 2023-10-18 (×6): qty 1

## 2023-10-18 MED ORDER — DOCUSATE SODIUM 100 MG PO CAPS
100.0000 mg | ORAL_CAPSULE | Freq: Two times a day (BID) | ORAL | Status: DC
  Administered 2023-10-18 – 2023-10-21 (×8): 100 mg via ORAL
  Filled 2023-10-18 (×9): qty 1

## 2023-10-18 MED ORDER — POLYETHYLENE GLYCOL 3350 17 G PO PACK: 17.0000 g | PACK | Freq: Every day | ORAL | Status: DC | PRN
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
## 2023-10-18 NOTE — Progress Notes (Addendum)
 1700 dose of sodium chloride  tablet with insufficient quantity to administer.  Message sent to pharmacy. Awaiting dose arrival.  Information provided to 7P RN Jada as dose re-timed to 2000.
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
## 2023-10-18 NOTE — Plan of Care (Signed)
 Alert, oriented x4. Able to get out of bed with PT/OT.  Family and friends remain at bedside.  Appetite improving.  Code Status changed to DNR today.  Purple DNR armband placed on patient.   Problem: Education: Goal: Knowledge of General Education information will improve Description: Including pain rating scale, medication(s)/side effects and non-pharmacologic comfort measures Outcome: Progressing   Problem: Health Behavior/Discharge Planning: Goal: Ability to manage health-related needs will improve Outcome: Progressing   Problem: Clinical Measurements: Goal: Ability to maintain clinical measurements within normal limits will improve Outcome: Progressing Goal: Will remain free from infection Outcome: Progressing Goal: Diagnostic test results will improve Outcome: Progressing
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
## 2023-10-19 LAB — CBC
HCT: 25.3 % — ABNORMAL LOW (ref 36.0–46.0)
Hemoglobin: 8.1 g/dL — ABNORMAL LOW (ref 12.0–15.0)
MCH: 31.6 pg (ref 26.0–34.0)
MCHC: 32 g/dL (ref 30.0–36.0)
MCV: 98.8 fL (ref 80.0–100.0)
Platelets: 339 10*3/uL (ref 150–400)
RBC: 2.56 MIL/uL — ABNORMAL LOW (ref 3.87–5.11)
RDW: 17.2 % — ABNORMAL HIGH (ref 11.5–15.5)
WBC: 11.2 10*3/uL — ABNORMAL HIGH (ref 4.0–10.5)
nRBC: 0 % (ref 0.0–0.2)

## 2023-10-19 LAB — BASIC METABOLIC PANEL WITH GFR
Anion gap: 10 (ref 5–15)
BUN: 13 mg/dL (ref 8–23)
CO2: 24 mmol/L (ref 22–32)
Calcium: 8.2 mg/dL — ABNORMAL LOW (ref 8.9–10.3)
Chloride: 98 mmol/L (ref 98–111)
Creatinine, Ser: 0.55 mg/dL (ref 0.44–1.00)
GFR, Estimated: >60 mL/min (ref >60)
Glucose, Bld: 106 mg/dL — ABNORMAL HIGH (ref 70–99)
Potassium: 4 mmol/L (ref 3.5–5.1)
Sodium: 132 mmol/L — ABNORMAL LOW (ref 135–145)

## 2023-10-19 MED ORDER — FERROUS SULFATE 325 (65 FE) MG PO TABS
325.0000 mg | ORAL_TABLET | Freq: Every day | ORAL | Status: DC
  Administered 2023-10-20 – 2023-10-22 (×3): 325 mg via ORAL
  Filled 2023-10-19 (×4): qty 1

## 2023-10-19 MED ORDER — VITAMIN C 500 MG PO TABS
500.0000 mg | ORAL_TABLET | Freq: Two times a day (BID) | ORAL | Status: DC
  Administered 2023-10-19 – 2023-10-22 (×6): 500 mg via ORAL
  Filled 2023-10-19 (×7): qty 1

## 2023-10-19 MED ORDER — ENOXAPARIN SODIUM 30 MG/0.3ML IJ SOSY
30.0000 mg | PREFILLED_SYRINGE | Freq: Two times a day (BID) | INTRAMUSCULAR | Status: DC
  Administered 2023-10-19 – 2023-10-21 (×6): 30 mg via SUBCUTANEOUS
  Filled 2023-10-19 (×6): qty 0.3
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
## 2023-10-20 ENCOUNTER — Other Ambulatory Visit: Payer: Self-pay

## 2023-10-20 LAB — BASIC METABOLIC PANEL WITH GFR
Anion gap: 12 (ref 5–15)
BUN: 14 mg/dL (ref 8–23)
CO2: 22 mmol/L (ref 22–32)
Calcium: 8.4 mg/dL — ABNORMAL LOW (ref 8.9–10.3)
Chloride: 98 mmol/L (ref 98–111)
Creatinine, Ser: 0.58 mg/dL (ref 0.44–1.00)
GFR, Estimated: >60 mL/min (ref >60)
Glucose, Bld: 96 mg/dL (ref 70–99)
Potassium: 3.8 mmol/L (ref 3.5–5.1)
Sodium: 132 mmol/L — ABNORMAL LOW (ref 135–145)

## 2023-10-20 LAB — CBC
HCT: 28.2 % — ABNORMAL LOW (ref 36.0–46.0)
Hemoglobin: 9.1 g/dL — ABNORMAL LOW (ref 12.0–15.0)
MCH: 32 pg (ref 26.0–34.0)
MCHC: 32.3 g/dL (ref 30.0–36.0)
MCV: 99.3 fL (ref 80.0–100.0)
Platelets: 406 10*3/uL — ABNORMAL HIGH (ref 150–400)
RBC: 2.84 MIL/uL — ABNORMAL LOW (ref 3.87–5.11)
RDW: 17.4 % — ABNORMAL HIGH (ref 11.5–15.5)
WBC: 14 10*3/uL — ABNORMAL HIGH (ref 4.0–10.5)
nRBC: 0 % (ref 0.0–0.2)

## 2023-10-20 NOTE — Plan of Care (Signed)
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
## 2023-10-21 MED ORDER — MELATONIN 3 MG PO TABS
3.0000 mg | ORAL_TABLET | Freq: Every day | ORAL | Status: DC
  Administered 2023-10-21: 3 mg via ORAL
  Filled 2023-10-21: qty 1

## 2023-10-21 NOTE — Progress Notes (Signed)
..  Trauma Event Note    Reason for Call :  Received message from primary RN Deadra Everts, pt requesting something to help her sleep. VO from Dr. Aniceto Barley for Melatonin 3mg  obtained and ordered.     Last imported Vital Signs BP 137/67 (BP Location: Right Arm)   Pulse (!) 59   Temp 98.4 F (36.9 C) (Oral)   Resp 16   Ht 5' 4 (1.626 m)   Wt 120 lb (54.4 kg)   SpO2 96%   BMI 20.60 kg/m   Trending CBC Recent Labs    10/19/23 0639 10/20/23 0456  WBC 11.2* 14.0*  HGB 8.1* 9.1*  HCT 25.3* 28.2*  PLT 339 406*    Trending Coag's No results for input(s): APTT, INR in the last 72 hours.  Trending BMET Recent Labs    10/19/23 1429 10/20/23 0456  NA 132* 132*  K 4.0 3.8  CL 98 98  CO2 24 22  BUN 13 14  CREATININE 0.55 0.58  GLUCOSE 106* 96      Berlie Hatchel Dee  Trauma Response RN  Please call TRN at [phone] for further assistance.
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
## 2023-10-21 NOTE — Plan of Care (Signed)
  Problem: Activity: Goal: Risk for activity intolerance will decrease Outcome: Progressing   Problem: Nutrition: Goal: Adequate nutrition will be maintained Outcome: Progressing   Problem: Coping: Goal: Level of anxiety will decrease Outcome: Progressing   Problem: Elimination: Goal: Will not experience complications related to bowel motility Outcome: Progressing   Problem: Pain Managment: Goal: General experience of comfort will improve and/or be controlled Outcome: Progressing

## 2023-10-22 DIAGNOSIS — S270XXA Traumatic pneumothorax, initial encounter: Secondary | ICD-10-CM | POA: Diagnosis not present

## 2023-10-22 DIAGNOSIS — I480 Paroxysmal atrial fibrillation: Secondary | ICD-10-CM | POA: Diagnosis not present

## 2023-10-22 DIAGNOSIS — S42022D Displaced fracture of shaft of left clavicle, subsequent encounter for fracture with routine healing: Secondary | ICD-10-CM | POA: Diagnosis not present

## 2023-10-22 DIAGNOSIS — Z4789 Encounter for other orthopedic aftercare: Secondary | ICD-10-CM | POA: Diagnosis not present

## 2023-10-22 DIAGNOSIS — S72411D Displaced unspecified condyle fracture of lower end of right femur, subsequent encounter for closed fracture with routine healing: Secondary | ICD-10-CM | POA: Diagnosis not present

## 2023-10-22 DIAGNOSIS — Z7901 Long term (current) use of anticoagulants: Secondary | ICD-10-CM | POA: Diagnosis not present

## 2023-10-22 DIAGNOSIS — S2242XA Multiple fractures of ribs, left side, initial encounter for closed fracture: Secondary | ICD-10-CM | POA: Diagnosis not present

## 2023-10-22 DIAGNOSIS — S329XXD Fracture of unspecified parts of lumbosacral spine and pelvis, subsequent encounter for fracture with routine healing: Secondary | ICD-10-CM | POA: Diagnosis not present

## 2023-10-22 DIAGNOSIS — S32039D Unspecified fracture of third lumbar vertebra, subsequent encounter for fracture with routine healing: Secondary | ICD-10-CM | POA: Diagnosis not present

## 2023-10-22 DIAGNOSIS — Z7401 Bed confinement status: Secondary | ICD-10-CM | POA: Diagnosis not present

## 2023-10-22 DIAGNOSIS — E871 Hypo-osmolality and hyponatremia: Secondary | ICD-10-CM | POA: Diagnosis not present

## 2023-10-22 DIAGNOSIS — S6291XS Unspecified fracture of right wrist and hand, sequela: Secondary | ICD-10-CM | POA: Diagnosis not present

## 2023-10-22 DIAGNOSIS — M25461 Effusion, right knee: Secondary | ICD-10-CM | POA: Diagnosis not present

## 2023-10-22 DIAGNOSIS — S42002A Fracture of unspecified part of left clavicle, initial encounter for closed fracture: Secondary | ICD-10-CM | POA: Diagnosis not present

## 2023-10-22 DIAGNOSIS — R6889 Other general symptoms and signs: Secondary | ICD-10-CM | POA: Diagnosis not present

## 2023-10-22 DIAGNOSIS — S72422D Displaced fracture of lateral condyle of left femur, subsequent encounter for closed fracture with routine healing: Secondary | ICD-10-CM | POA: Diagnosis not present

## 2023-10-22 DIAGNOSIS — S2242XD Multiple fractures of ribs, left side, subsequent encounter for fracture with routine healing: Secondary | ICD-10-CM | POA: Diagnosis not present

## 2023-10-22 DIAGNOSIS — D519 Vitamin B12 deficiency anemia, unspecified: Secondary | ICD-10-CM | POA: Diagnosis not present

## 2023-10-22 DIAGNOSIS — S3289XA Fracture of other parts of pelvis, initial encounter for closed fracture: Secondary | ICD-10-CM | POA: Diagnosis not present

## 2023-10-22 DIAGNOSIS — S32811D Multiple fractures of pelvis with unstable disruption of pelvic ring, subsequent encounter for fracture with routine healing: Secondary | ICD-10-CM | POA: Diagnosis not present

## 2023-10-22 DIAGNOSIS — M19049 Primary osteoarthritis, unspecified hand: Secondary | ICD-10-CM | POA: Diagnosis not present

## 2023-10-22 DIAGNOSIS — S32592A Other specified fracture of left pubis, initial encounter for closed fracture: Secondary | ICD-10-CM | POA: Diagnosis not present

## 2023-10-22 DIAGNOSIS — Z743 Need for continuous supervision: Secondary | ICD-10-CM | POA: Diagnosis not present

## 2023-10-22 DIAGNOSIS — E039 Hypothyroidism, unspecified: Secondary | ICD-10-CM | POA: Diagnosis not present

## 2023-10-22 DIAGNOSIS — S42002D Fracture of unspecified part of left clavicle, subsequent encounter for fracture with routine healing: Secondary | ICD-10-CM | POA: Diagnosis not present

## 2023-10-22 DIAGNOSIS — I10 Essential (primary) hypertension: Secondary | ICD-10-CM | POA: Diagnosis not present

## 2023-10-22 DIAGNOSIS — S32049D Unspecified fracture of fourth lumbar vertebra, subsequent encounter for fracture with routine healing: Secondary | ICD-10-CM | POA: Diagnosis not present

## 2023-10-22 DIAGNOSIS — E031 Congenital hypothyroidism without goiter: Secondary | ICD-10-CM | POA: Diagnosis not present

## 2023-10-22 LAB — CBC
HCT: 25.9 % — ABNORMAL LOW (ref 36.0–46.0)
Hemoglobin: 8.2 g/dL — ABNORMAL LOW (ref 12.0–15.0)
MCH: 32 pg (ref 26.0–34.0)
MCHC: 31.7 g/dL (ref 30.0–36.0)
MCV: 101.2 fL — ABNORMAL HIGH (ref 80.0–100.0)
Platelets: 412 10*3/uL — ABNORMAL HIGH (ref 150–400)
RBC: 2.56 MIL/uL — ABNORMAL LOW (ref 3.87–5.11)
RDW: 18.2 % — ABNORMAL HIGH (ref 11.5–15.5)
WBC: 10.2 10*3/uL (ref 4.0–10.5)
nRBC: 0 % (ref 0.0–0.2)

## 2023-10-22 LAB — BASIC METABOLIC PANEL WITH GFR
Anion gap: 7 (ref 5–15)
BUN: 16 mg/dL (ref 8–23)
CO2: 24 mmol/L (ref 22–32)
Calcium: 7.9 mg/dL — ABNORMAL LOW (ref 8.9–10.3)
Chloride: 100 mmol/L (ref 98–111)
Creatinine, Ser: 0.63 mg/dL (ref 0.44–1.00)
GFR, Estimated: >60 mL/min (ref >60)
Glucose, Bld: 89 mg/dL (ref 70–99)
Potassium: 3.8 mmol/L (ref 3.5–5.1)
Sodium: 131 mmol/L — ABNORMAL LOW (ref 135–145)

## 2023-10-22 MED ORDER — POLYVINYL ALCOHOL 1.4 % OP SOLN: 1.0000 [drp] | OPHTHALMIC | Status: AC | PRN

## 2023-10-22 MED ORDER — CARVEDILOL 12.5 MG PO TABS: 12.5000 mg | ORAL_TABLET | Freq: Two times a day (BID) | ORAL | Status: DC

## 2023-10-22 MED ORDER — POTASSIUM CHLORIDE CRYS ER 20 MEQ PO TBCR
20.0000 meq | EXTENDED_RELEASE_TABLET | Freq: Once | ORAL | Status: AC
  Administered 2023-10-22: 20 meq via ORAL
  Filled 2023-10-22: qty 1

## 2023-10-22 MED ORDER — FERROUS SULFATE 325 (65 FE) MG PO TABS: 325.0000 mg | ORAL_TABLET | Freq: Every day | ORAL | Status: DC

## 2023-10-22 MED ORDER — AMLODIPINE BESYLATE 5 MG PO TABS: 5.0000 mg | ORAL_TABLET | Freq: Every day | ORAL | Status: DC

## 2023-10-22 MED ORDER — ADULT MULTIVITAMIN W/MINERALS CH: 1.0000 | ORAL_TABLET | Freq: Every day | ORAL | Status: AC

## 2023-10-22 MED ORDER — APIXABAN 2.5 MG PO TABS
2.5000 mg | ORAL_TABLET | Freq: Two times a day (BID) | ORAL | Status: DC
  Administered 2023-10-22: 2.5 mg via ORAL
  Filled 2023-10-22: qty 1

## 2023-10-22 MED ORDER — MELATONIN 3 MG PO TABS: 3.0000 mg | ORAL_TABLET | Freq: Every evening | ORAL | Status: DC | PRN

## 2023-10-22 MED ORDER — ASCORBIC ACID 500 MG PO TABS: 500.0000 mg | ORAL_TABLET | Freq: Two times a day (BID) | ORAL | Status: DC

## 2023-10-22 MED ORDER — ACETAMINOPHEN 500 MG PO TABS: 1000.0000 mg | ORAL_TABLET | Freq: Three times a day (TID) | ORAL | Status: AC

## 2023-10-22 MED ORDER — SODIUM CHLORIDE 1 G PO TABS: 2.0000 g | ORAL_TABLET | Freq: Three times a day (TID) | ORAL | Status: DC

## 2023-10-22 MED ORDER — OXYCODONE HCL 5 MG PO TABS: 5.0000 mg | ORAL_TABLET | Freq: Three times a day (TID) | ORAL | 0 refills | Status: DC | PRN

## 2023-10-22 MED ORDER — DOCUSATE SODIUM 100 MG PO CAPS: 100.0000 mg | ORAL_CAPSULE | Freq: Two times a day (BID) | ORAL | Status: DC

## 2023-10-22 MED ORDER — POLYETHYLENE GLYCOL 3350 17 G PO PACK: 17.0000 g | PACK | Freq: Every day | ORAL | Status: DC | PRN

## 2023-10-22 MED ORDER — ENSURE PLUS HIGH PROTEIN PO LIQD: 237.0000 mL | Freq: Two times a day (BID) | ORAL | Status: DC
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
## 2023-10-22 NOTE — TOC Progression Note (Signed)
 Transition of Care [facility]) - Progression Note    Patient Details  Name: [name] MRN: [id number] Date of Birth: 07-Feb-1932  Transition [facility] Care [facility]) CM/SW Contact  Dexton Zwilling E Micayla Brathwaite, LCSW Phone Number: 10/22/2023, 9:17 AM  Clinical Narrative:    Siegfried Dress approved in [place] Left VM for Logan at [place], awaiting reply to confirm they can still take patient today.   Expected Discharge Plan: Skilled Nursing Facility Barriers to Discharge: English as a second language teacher, Continued Medical Work up, SNF Pending bed offer  Expected Discharge Plan and Services In-house Referral: Clinical Social Work     Living arrangements for the past 2 months: Single Family Home                                       Social Determinants of Health (SDOH) Interventions SDOH Screenings   Food Insecurity: No Food Insecurity (10/12/2023)  Housing: Low Risk  (10/12/2023)  Transportation Needs: No Transportation Needs (10/12/2023)  Utilities: Not At Risk (10/12/2023)  Alcohol  Screen: Low Risk  (04/15/2023)  Depression (PHQ2-9): Low Risk  (04/19/2023)  Financial Resource Strain: Low Risk  (04/15/2023)  Physical Activity: Sufficiently Active (04/15/2023)  Social Connections: Unknown (10/12/2023)  Stress: No Stress Concern Present (04/15/2023)  Tobacco Use: Medium Risk (10/11/2023)  Health Literacy: Adequate Health Literacy (04/17/2023)    Readmission Risk Interventions     No data to display
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
## 2023-10-22 NOTE — TOC Transition Note (Signed)
 Transition of Care [facility]) - Discharge Note   Patient Details  Name: [name] MRN: [id number] Date of Birth: 09-15-1931  [facility]) CM/SW Contact:  Miqueas Whilden E Andelyn Spade, LCSW Phone Number: 10/22/2023, 10:31 AM   Clinical Narrative:    Discharge to [facility] today. Room 308B. Confirmed with Admissions Worker Logan. Updated MD, RN, and daughter [name]. Asked RN to call report. EMS paperwork completed. Requested signed DNR be sent with patient. PTAR arranged for pick up 12pm or later pending truck availability.    Final next level of care: Skilled Nursing Facility Barriers to Discharge: Barriers Resolved   Patient Goals and CMS Choice   CMS Medicare.gov Compare Post Acute Care list provided to:: Patient Represenative (must comment) Choice offered to / list presented to : Adult Children      Discharge Placement              Patient chooses bed at: [facility] Patient to be transferred to facility by: PT[facility] Name of family member notified: [name] Patient and family notified of of transfer: 10/22/23  Discharge Plan and Services Additional resources added to the After Visit Summary for   In-house Referral: Clinical Social Work                                   Social Drivers of Health (SDOH) Interventions SDOH Screenings   Food Insecurity: No Food Insecurity (10/12/2023)  Housing: Low Risk  (10/12/2023)  Transportation Needs: No Transportation Needs (10/12/2023)  Utilities: Not At Risk (10/12/2023)  Alcohol  Screen: Low Risk  (04/15/2023)  Depression (PHQ2-9): Low Risk  (04/19/2023)  Financial Resource Strain: Low Risk  (04/15/2023)  Physical Activity: Sufficiently Active (04/15/2023)  Social Connections: Unknown (10/12/2023)  Stress: No Stress Concern Present (04/15/2023)  Tobacco Use: Medium Risk (10/11/2023)  Health Literacy: Adequate Health Literacy (04/17/2023)     Readmission Risk Interventions     No data to display
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
## 2023-10-22 NOTE — Plan of Care (Signed)
   Problem: Activity: Goal: Risk for activity intolerance will decrease Outcome: Progressing   Problem: Nutrition: Goal: Adequate nutrition will be maintained Outcome: Progressing   Problem: Coping: Goal: Level of anxiety will decrease Outcome: Progressing   Problem: Elimination: Goal: Will not experience complications related to bowel motility Outcome: Progressing

## 2023-10-24 DIAGNOSIS — I10 Essential (primary) hypertension: Secondary | ICD-10-CM | POA: Diagnosis not present

## 2023-10-26 DIAGNOSIS — S72422D Displaced fracture of lateral condyle of left femur, subsequent encounter for closed fracture with routine healing: Secondary | ICD-10-CM | POA: Diagnosis not present

## 2023-10-26 DIAGNOSIS — E039 Hypothyroidism, unspecified: Secondary | ICD-10-CM | POA: Diagnosis not present

## 2023-10-26 DIAGNOSIS — S32039D Unspecified fracture of third lumbar vertebra, subsequent encounter for fracture with routine healing: Secondary | ICD-10-CM | POA: Diagnosis not present

## 2023-10-26 DIAGNOSIS — S42002D Fracture of unspecified part of left clavicle, subsequent encounter for fracture with routine healing: Secondary | ICD-10-CM | POA: Diagnosis not present

## 2023-10-26 DIAGNOSIS — S32049D Unspecified fracture of fourth lumbar vertebra, subsequent encounter for fracture with routine healing: Secondary | ICD-10-CM | POA: Diagnosis not present

## 2023-10-26 DIAGNOSIS — E871 Hypo-osmolality and hyponatremia: Secondary | ICD-10-CM | POA: Diagnosis not present

## 2023-10-26 DIAGNOSIS — I10 Essential (primary) hypertension: Secondary | ICD-10-CM | POA: Diagnosis not present

## 2023-10-26 DIAGNOSIS — I480 Paroxysmal atrial fibrillation: Secondary | ICD-10-CM | POA: Diagnosis not present

## 2023-10-26 DIAGNOSIS — S2242XD Multiple fractures of ribs, left side, subsequent encounter for fracture with routine healing: Secondary | ICD-10-CM | POA: Diagnosis not present

## 2023-10-26 DIAGNOSIS — Z7901 Long term (current) use of anticoagulants: Secondary | ICD-10-CM | POA: Diagnosis not present

## 2023-11-05 DIAGNOSIS — S32811D Multiple fractures of pelvis with unstable disruption of pelvic ring, subsequent encounter for fracture with routine healing: Secondary | ICD-10-CM | POA: Diagnosis not present

## 2023-11-05 DIAGNOSIS — S72411D Displaced unspecified condyle fracture of lower end of right femur, subsequent encounter for closed fracture with routine healing: Secondary | ICD-10-CM | POA: Diagnosis not present

## 2023-11-05 DIAGNOSIS — S42022D Displaced fracture of shaft of left clavicle, subsequent encounter for fracture with routine healing: Secondary | ICD-10-CM | POA: Diagnosis not present

## 2023-11-12 DIAGNOSIS — D519 Vitamin B12 deficiency anemia, unspecified: Secondary | ICD-10-CM | POA: Diagnosis not present

## 2023-11-12 DIAGNOSIS — I10 Essential (primary) hypertension: Secondary | ICD-10-CM | POA: Diagnosis not present

## 2023-11-12 DIAGNOSIS — E031 Congenital hypothyroidism without goiter: Secondary | ICD-10-CM | POA: Diagnosis not present

## 2023-11-13 DIAGNOSIS — I10 Essential (primary) hypertension: Secondary | ICD-10-CM | POA: Diagnosis not present

## 2023-11-13 DIAGNOSIS — I480 Paroxysmal atrial fibrillation: Secondary | ICD-10-CM | POA: Diagnosis not present

## 2023-11-13 DIAGNOSIS — E871 Hypo-osmolality and hyponatremia: Secondary | ICD-10-CM | POA: Diagnosis not present

## 2023-11-13 DIAGNOSIS — Z87828 Personal history of other (healed) physical injury and trauma: Secondary | ICD-10-CM | POA: Diagnosis not present

## 2023-11-13 DIAGNOSIS — M8448XD Pathological fracture, other site, subsequent encounter for fracture with routine healing: Secondary | ICD-10-CM | POA: Diagnosis not present

## 2023-11-13 DIAGNOSIS — S32592D Other specified fracture of left pubis, subsequent encounter for fracture with routine healing: Secondary | ICD-10-CM | POA: Diagnosis not present

## 2023-11-13 DIAGNOSIS — E039 Hypothyroidism, unspecified: Secondary | ICD-10-CM | POA: Diagnosis not present

## 2023-11-13 DIAGNOSIS — S32039D Unspecified fracture of third lumbar vertebra, subsequent encounter for fracture with routine healing: Secondary | ICD-10-CM | POA: Diagnosis not present

## 2023-11-13 DIAGNOSIS — S42002D Fracture of unspecified part of left clavicle, subsequent encounter for fracture with routine healing: Secondary | ICD-10-CM | POA: Diagnosis not present

## 2023-11-13 DIAGNOSIS — S32049D Unspecified fracture of fourth lumbar vertebra, subsequent encounter for fracture with routine healing: Secondary | ICD-10-CM | POA: Diagnosis not present

## 2023-11-13 DIAGNOSIS — S2242XD Multiple fractures of ribs, left side, subsequent encounter for fracture with routine healing: Secondary | ICD-10-CM | POA: Diagnosis not present

## 2023-11-13 DIAGNOSIS — Z7901 Long term (current) use of anticoagulants: Secondary | ICD-10-CM | POA: Diagnosis not present

## 2023-11-14 DIAGNOSIS — S42002A Fracture of unspecified part of left clavicle, initial encounter for closed fracture: Secondary | ICD-10-CM | POA: Diagnosis not present

## 2023-11-14 DIAGNOSIS — R2689 Other abnormalities of gait and mobility: Secondary | ICD-10-CM | POA: Diagnosis not present

## 2023-11-14 DIAGNOSIS — R262 Difficulty in walking, not elsewhere classified: Secondary | ICD-10-CM | POA: Diagnosis not present

## 2023-11-14 DIAGNOSIS — M6281 Muscle weakness (generalized): Secondary | ICD-10-CM | POA: Diagnosis not present

## 2023-11-16 ENCOUNTER — Other Ambulatory Visit: Payer: Self-pay | Admitting: Nurse Practitioner

## 2023-11-16 DIAGNOSIS — S42002D Fracture of unspecified part of left clavicle, subsequent encounter for fracture with routine healing: Secondary | ICD-10-CM | POA: Diagnosis not present

## 2023-11-16 DIAGNOSIS — Z7901 Long term (current) use of anticoagulants: Secondary | ICD-10-CM | POA: Diagnosis not present

## 2023-11-16 DIAGNOSIS — I10 Essential (primary) hypertension: Secondary | ICD-10-CM | POA: Diagnosis not present

## 2023-11-16 DIAGNOSIS — S3289XD Fracture of other parts of pelvis, subsequent encounter for fracture with routine healing: Secondary | ICD-10-CM | POA: Diagnosis not present

## 2023-11-16 DIAGNOSIS — S2242XD Multiple fractures of ribs, left side, subsequent encounter for fracture with routine healing: Secondary | ICD-10-CM | POA: Diagnosis not present

## 2023-11-16 DIAGNOSIS — Z5982 Transportation insecurity: Secondary | ICD-10-CM | POA: Diagnosis not present

## 2023-11-16 DIAGNOSIS — D509 Iron deficiency anemia, unspecified: Secondary | ICD-10-CM | POA: Diagnosis not present

## 2023-11-16 DIAGNOSIS — E039 Hypothyroidism, unspecified: Secondary | ICD-10-CM | POA: Diagnosis not present

## 2023-11-16 DIAGNOSIS — S6291XD Unspecified fracture of right wrist and hand, subsequent encounter for fracture with routine healing: Secondary | ICD-10-CM | POA: Diagnosis not present

## 2023-11-16 DIAGNOSIS — E559 Vitamin D deficiency, unspecified: Secondary | ICD-10-CM | POA: Diagnosis not present

## 2023-11-16 DIAGNOSIS — S72421D Displaced fracture of lateral condyle of right femur, subsequent encounter for closed fracture with routine healing: Secondary | ICD-10-CM | POA: Diagnosis not present

## 2023-11-16 DIAGNOSIS — M8580 Other specified disorders of bone density and structure, unspecified site: Secondary | ICD-10-CM | POA: Diagnosis not present

## 2023-11-16 DIAGNOSIS — I480 Paroxysmal atrial fibrillation: Secondary | ICD-10-CM | POA: Diagnosis not present

## 2023-11-16 DIAGNOSIS — Z79891 Long term (current) use of opiate analgesic: Secondary | ICD-10-CM | POA: Diagnosis not present

## 2023-11-16 DIAGNOSIS — M150 Primary generalized (osteo)arthritis: Secondary | ICD-10-CM | POA: Diagnosis not present

## 2023-11-16 DIAGNOSIS — Z85820 Personal history of malignant melanoma of skin: Secondary | ICD-10-CM | POA: Diagnosis not present

## 2023-11-16 DIAGNOSIS — Z556 Problems related to health literacy: Secondary | ICD-10-CM | POA: Diagnosis not present

## 2023-11-16 DIAGNOSIS — Z8673 Personal history of transient ischemic attack (TIA), and cerebral infarction without residual deficits: Secondary | ICD-10-CM | POA: Diagnosis not present

## 2023-11-16 DIAGNOSIS — E782 Mixed hyperlipidemia: Secondary | ICD-10-CM | POA: Diagnosis not present

## 2023-11-16 MED ORDER — AMLODIPINE BESYLATE 5 MG PO TABS: 5.0000 mg | ORAL_TABLET | Freq: Every day | ORAL | Status: DC

## 2023-11-16 NOTE — Telephone Encounter (Signed)
 Copied from CRM [id number]. Topic: Clinical - Medication Refill >> Nov 16, 2023  1:00 PM Carlatta H wrote: Medication: amLODipine  (NORVASC ) 5 MG tablet  Has the patient contacted their pharmacy? No (Agent: If no, request that the patient contact the pharmacy for the refill. If patient does not wish to contact the pharmacy document the reason why and proceed with request.) (Agent: If yes, when and what did the pharmacy advise?)  This is the patient's preferred pharmacy:  [id number] [place], [place] - [address] AT [facility] & [address] [place] [address] Phone: [phone] Fax: [phone]    Is this the correct pharmacy for this prescription? Yes If no, delete pharmacy and type the correct one.   Has the prescription been filled recently? No  Is the patient out of the medication? Yes  Has the patient been seen for an appointment in the last year OR does the patient have an upcoming appointment? Yes  Can we respond through MyChart? No  Agent: Please be advised that Rx refills may take up to 3 business days. We ask that you follow-up with your pharmacy.

## 2023-11-19 ENCOUNTER — Telehealth: Payer: Self-pay | Admitting: Internal Medicine

## 2023-11-19 ENCOUNTER — Inpatient Hospital Stay: Admitting: Medical

## 2023-11-19 NOTE — Telephone Encounter (Signed)
 Pt has an appt 11/26/23 with Ludie.

## 2023-11-19 NOTE — Telephone Encounter (Signed)
 Copied from CRM [id number]. Topic: Clinical - Home Health Verbal Orders >> Nov 16, 2023  4:37 PM Graeme ORN wrote: Caller/Agency: [facility] Callback Number: [phone] secure vm  Service Requested: Physical Therapy Frequency: 1 week 1, 2 week 3, 1 week 5 Any new concerns about the patient? No

## 2023-11-21 ENCOUNTER — Ambulatory Visit: Admitting: Medical

## 2023-11-21 DIAGNOSIS — Z7901 Long term (current) use of anticoagulants: Secondary | ICD-10-CM | POA: Diagnosis not present

## 2023-11-21 DIAGNOSIS — Z9289 Personal history of other medical treatment: Secondary | ICD-10-CM

## 2023-11-21 DIAGNOSIS — E878 Other disorders of electrolyte and fluid balance, not elsewhere classified: Secondary | ICD-10-CM | POA: Diagnosis not present

## 2023-11-21 DIAGNOSIS — J939 Pneumothorax, unspecified: Secondary | ICD-10-CM

## 2023-11-21 DIAGNOSIS — D509 Iron deficiency anemia, unspecified: Secondary | ICD-10-CM | POA: Diagnosis not present

## 2023-11-21 DIAGNOSIS — I4819 Other persistent atrial fibrillation: Secondary | ICD-10-CM

## 2023-11-21 DIAGNOSIS — S72421D Displaced fracture of lateral condyle of right femur, subsequent encounter for closed fracture with routine healing: Secondary | ICD-10-CM | POA: Diagnosis not present

## 2023-11-21 DIAGNOSIS — S2249XD Multiple fractures of ribs, unspecified side, subsequent encounter for fracture with routine healing: Secondary | ICD-10-CM | POA: Diagnosis not present

## 2023-11-21 DIAGNOSIS — M150 Primary generalized (osteo)arthritis: Secondary | ICD-10-CM | POA: Diagnosis not present

## 2023-11-21 DIAGNOSIS — Z79891 Long term (current) use of opiate analgesic: Secondary | ICD-10-CM | POA: Diagnosis not present

## 2023-11-21 DIAGNOSIS — E559 Vitamin D deficiency, unspecified: Secondary | ICD-10-CM | POA: Diagnosis not present

## 2023-11-21 DIAGNOSIS — S2242XD Multiple fractures of ribs, left side, subsequent encounter for fracture with routine healing: Secondary | ICD-10-CM | POA: Diagnosis not present

## 2023-11-21 DIAGNOSIS — I480 Paroxysmal atrial fibrillation: Secondary | ICD-10-CM | POA: Diagnosis not present

## 2023-11-21 DIAGNOSIS — E039 Hypothyroidism, unspecified: Secondary | ICD-10-CM

## 2023-11-21 DIAGNOSIS — S3289XD Fracture of other parts of pelvis, subsequent encounter for fracture with routine healing: Secondary | ICD-10-CM | POA: Diagnosis not present

## 2023-11-21 DIAGNOSIS — Z556 Problems related to health literacy: Secondary | ICD-10-CM | POA: Diagnosis not present

## 2023-11-21 DIAGNOSIS — Z8673 Personal history of transient ischemic attack (TIA), and cerebral infarction without residual deficits: Secondary | ICD-10-CM | POA: Diagnosis not present

## 2023-11-21 DIAGNOSIS — I10 Essential (primary) hypertension: Secondary | ICD-10-CM | POA: Diagnosis not present

## 2023-11-21 DIAGNOSIS — S6291XD Unspecified fracture of right wrist and hand, subsequent encounter for fracture with routine healing: Secondary | ICD-10-CM | POA: Diagnosis not present

## 2023-11-21 DIAGNOSIS — Z85820 Personal history of malignant melanoma of skin: Secondary | ICD-10-CM | POA: Diagnosis not present

## 2023-11-21 DIAGNOSIS — Z5982 Transportation insecurity: Secondary | ICD-10-CM | POA: Diagnosis not present

## 2023-11-21 DIAGNOSIS — M8580 Other specified disorders of bone density and structure, unspecified site: Secondary | ICD-10-CM | POA: Diagnosis not present

## 2023-11-21 DIAGNOSIS — E782 Mixed hyperlipidemia: Secondary | ICD-10-CM | POA: Diagnosis not present

## 2023-11-21 DIAGNOSIS — S42002D Fracture of unspecified part of left clavicle, subsequent encounter for fracture with routine healing: Secondary | ICD-10-CM | POA: Diagnosis not present
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
## 2023-11-22 ENCOUNTER — Ambulatory Visit: Payer: Self-pay | Admitting: Medical

## 2023-11-22 LAB — CBC
Hematocrit: 34 % (ref 34.0–46.6)
Hemoglobin: 11.4 g/dL (ref 11.1–15.9)
MCH: 34.5 pg — ABNORMAL HIGH (ref 26.6–33.0)
MCHC: 33.5 g/dL (ref 31.5–35.7)
MCV: 103 fL — ABNORMAL HIGH (ref 79–97)
Platelets: 320 x10E3/uL (ref 150–450)
RBC: 3.3 x10E6/uL — ABNORMAL LOW (ref 3.77–5.28)
RDW: 15.5 % — ABNORMAL HIGH (ref 11.7–15.4)
WBC: 9.1 x10E3/uL (ref 3.4–10.8)

## 2023-11-22 LAB — BASIC METABOLIC PANEL WITH GFR
BUN/Creatinine Ratio: 21 (ref 12–28)
BUN: 12 mg/dL (ref 10–36)
CO2: 21 mmol/L (ref 20–29)
Calcium: 9.5 mg/dL (ref 8.7–10.3)
Chloride: 98 mmol/L (ref 96–106)
Creatinine, Ser: 0.56 mg/dL — AB (ref 0.57–1.00)
Glucose: 81 mg/dL (ref 70–99)
Potassium: 4.1 mmol/L (ref 3.5–5.2)
Sodium: 135 mmol/L (ref 134–144)
eGFR: 87 mL/min/1.73 (ref >59)

## 2023-11-22 LAB — TSH+FREE T4
Free T4: 1.25 ng/dL (ref 0.82–1.77)
TSH: 25.3 u[IU]/mL — AB (ref 0.450–4.500)

## 2023-11-22 MED ORDER — LEVOTHYROXINE SODIUM 112 MCG PO TABS
112.0000 ug | ORAL_TABLET | Freq: Every day | ORAL | 1 refills | Status: DC
Start: 2023-11-22 — End: 2024-02-25

## 2023-11-22 MED ORDER — APIXABAN 2.5 MG PO TABS: 2.5000 mg | ORAL_TABLET | Freq: Two times a day (BID) | ORAL | 1 refills | Status: AC
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
## 2023-11-23 DIAGNOSIS — I480 Paroxysmal atrial fibrillation: Secondary | ICD-10-CM | POA: Diagnosis not present

## 2023-11-23 DIAGNOSIS — M150 Primary generalized (osteo)arthritis: Secondary | ICD-10-CM | POA: Diagnosis not present

## 2023-11-23 DIAGNOSIS — Z5982 Transportation insecurity: Secondary | ICD-10-CM | POA: Diagnosis not present

## 2023-11-23 DIAGNOSIS — Z556 Problems related to health literacy: Secondary | ICD-10-CM | POA: Diagnosis not present

## 2023-11-23 DIAGNOSIS — E782 Mixed hyperlipidemia: Secondary | ICD-10-CM | POA: Diagnosis not present

## 2023-11-23 DIAGNOSIS — S72421D Displaced fracture of lateral condyle of right femur, subsequent encounter for closed fracture with routine healing: Secondary | ICD-10-CM | POA: Diagnosis not present

## 2023-11-23 DIAGNOSIS — D509 Iron deficiency anemia, unspecified: Secondary | ICD-10-CM | POA: Diagnosis not present

## 2023-11-23 DIAGNOSIS — E559 Vitamin D deficiency, unspecified: Secondary | ICD-10-CM | POA: Diagnosis not present

## 2023-11-23 DIAGNOSIS — S3289XD Fracture of other parts of pelvis, subsequent encounter for fracture with routine healing: Secondary | ICD-10-CM | POA: Diagnosis not present

## 2023-11-23 DIAGNOSIS — M8580 Other specified disorders of bone density and structure, unspecified site: Secondary | ICD-10-CM | POA: Diagnosis not present

## 2023-11-23 DIAGNOSIS — Z8673 Personal history of transient ischemic attack (TIA), and cerebral infarction without residual deficits: Secondary | ICD-10-CM | POA: Diagnosis not present

## 2023-11-23 DIAGNOSIS — S6291XD Unspecified fracture of right wrist and hand, subsequent encounter for fracture with routine healing: Secondary | ICD-10-CM | POA: Diagnosis not present

## 2023-11-23 DIAGNOSIS — S42002D Fracture of unspecified part of left clavicle, subsequent encounter for fracture with routine healing: Secondary | ICD-10-CM | POA: Diagnosis not present

## 2023-11-23 DIAGNOSIS — Z7901 Long term (current) use of anticoagulants: Secondary | ICD-10-CM | POA: Diagnosis not present

## 2023-11-23 DIAGNOSIS — S2242XD Multiple fractures of ribs, left side, subsequent encounter for fracture with routine healing: Secondary | ICD-10-CM | POA: Diagnosis not present

## 2023-11-23 DIAGNOSIS — Z79891 Long term (current) use of opiate analgesic: Secondary | ICD-10-CM | POA: Diagnosis not present

## 2023-11-23 DIAGNOSIS — I10 Essential (primary) hypertension: Secondary | ICD-10-CM | POA: Diagnosis not present

## 2023-11-23 DIAGNOSIS — E039 Hypothyroidism, unspecified: Secondary | ICD-10-CM | POA: Diagnosis not present

## 2023-11-23 DIAGNOSIS — Z85820 Personal history of malignant melanoma of skin: Secondary | ICD-10-CM | POA: Diagnosis not present

## 2023-11-26 ENCOUNTER — Inpatient Hospital Stay: Admitting: Medical

## 2023-11-26 DIAGNOSIS — E559 Vitamin D deficiency, unspecified: Secondary | ICD-10-CM | POA: Diagnosis not present

## 2023-11-26 DIAGNOSIS — S3289XD Fracture of other parts of pelvis, subsequent encounter for fracture with routine healing: Secondary | ICD-10-CM | POA: Diagnosis not present

## 2023-11-26 DIAGNOSIS — E782 Mixed hyperlipidemia: Secondary | ICD-10-CM | POA: Diagnosis not present

## 2023-11-26 DIAGNOSIS — S6291XD Unspecified fracture of right wrist and hand, subsequent encounter for fracture with routine healing: Secondary | ICD-10-CM | POA: Diagnosis not present

## 2023-11-26 DIAGNOSIS — S42002D Fracture of unspecified part of left clavicle, subsequent encounter for fracture with routine healing: Secondary | ICD-10-CM | POA: Diagnosis not present

## 2023-11-26 DIAGNOSIS — E039 Hypothyroidism, unspecified: Secondary | ICD-10-CM | POA: Diagnosis not present

## 2023-11-26 DIAGNOSIS — I10 Essential (primary) hypertension: Secondary | ICD-10-CM | POA: Diagnosis not present

## 2023-11-26 DIAGNOSIS — S72421D Displaced fracture of lateral condyle of right femur, subsequent encounter for closed fracture with routine healing: Secondary | ICD-10-CM | POA: Diagnosis not present

## 2023-11-26 DIAGNOSIS — M8580 Other specified disorders of bone density and structure, unspecified site: Secondary | ICD-10-CM | POA: Diagnosis not present

## 2023-11-26 DIAGNOSIS — S2242XD Multiple fractures of ribs, left side, subsequent encounter for fracture with routine healing: Secondary | ICD-10-CM | POA: Diagnosis not present

## 2023-11-26 DIAGNOSIS — D509 Iron deficiency anemia, unspecified: Secondary | ICD-10-CM | POA: Diagnosis not present

## 2023-11-26 DIAGNOSIS — I480 Paroxysmal atrial fibrillation: Secondary | ICD-10-CM | POA: Diagnosis not present

## 2023-11-28 DIAGNOSIS — S2242XD Multiple fractures of ribs, left side, subsequent encounter for fracture with routine healing: Secondary | ICD-10-CM | POA: Diagnosis not present

## 2023-11-28 DIAGNOSIS — M150 Primary generalized (osteo)arthritis: Secondary | ICD-10-CM | POA: Diagnosis not present

## 2023-11-28 DIAGNOSIS — Z556 Problems related to health literacy: Secondary | ICD-10-CM | POA: Diagnosis not present

## 2023-11-28 DIAGNOSIS — Z8673 Personal history of transient ischemic attack (TIA), and cerebral infarction without residual deficits: Secondary | ICD-10-CM | POA: Diagnosis not present

## 2023-11-28 DIAGNOSIS — I480 Paroxysmal atrial fibrillation: Secondary | ICD-10-CM | POA: Diagnosis not present

## 2023-11-28 DIAGNOSIS — S72421D Displaced fracture of lateral condyle of right femur, subsequent encounter for closed fracture with routine healing: Secondary | ICD-10-CM | POA: Diagnosis not present

## 2023-11-28 DIAGNOSIS — E782 Mixed hyperlipidemia: Secondary | ICD-10-CM | POA: Diagnosis not present

## 2023-11-28 DIAGNOSIS — Z5982 Transportation insecurity: Secondary | ICD-10-CM | POA: Diagnosis not present

## 2023-11-28 DIAGNOSIS — D509 Iron deficiency anemia, unspecified: Secondary | ICD-10-CM | POA: Diagnosis not present

## 2023-11-28 DIAGNOSIS — S42002D Fracture of unspecified part of left clavicle, subsequent encounter for fracture with routine healing: Secondary | ICD-10-CM | POA: Diagnosis not present

## 2023-11-28 DIAGNOSIS — E559 Vitamin D deficiency, unspecified: Secondary | ICD-10-CM | POA: Diagnosis not present

## 2023-11-28 DIAGNOSIS — S3289XD Fracture of other parts of pelvis, subsequent encounter for fracture with routine healing: Secondary | ICD-10-CM | POA: Diagnosis not present

## 2023-11-28 DIAGNOSIS — Z85820 Personal history of malignant melanoma of skin: Secondary | ICD-10-CM | POA: Diagnosis not present

## 2023-11-28 DIAGNOSIS — Z79891 Long term (current) use of opiate analgesic: Secondary | ICD-10-CM | POA: Diagnosis not present

## 2023-11-28 DIAGNOSIS — I10 Essential (primary) hypertension: Secondary | ICD-10-CM | POA: Diagnosis not present

## 2023-11-28 DIAGNOSIS — M8580 Other specified disorders of bone density and structure, unspecified site: Secondary | ICD-10-CM | POA: Diagnosis not present

## 2023-11-28 DIAGNOSIS — E039 Hypothyroidism, unspecified: Secondary | ICD-10-CM | POA: Diagnosis not present

## 2023-11-28 DIAGNOSIS — Z7901 Long term (current) use of anticoagulants: Secondary | ICD-10-CM | POA: Diagnosis not present

## 2023-11-28 DIAGNOSIS — S6291XD Unspecified fracture of right wrist and hand, subsequent encounter for fracture with routine healing: Secondary | ICD-10-CM | POA: Diagnosis not present

## 2023-11-30 DIAGNOSIS — Z7901 Long term (current) use of anticoagulants: Secondary | ICD-10-CM | POA: Diagnosis not present

## 2023-11-30 DIAGNOSIS — M8580 Other specified disorders of bone density and structure, unspecified site: Secondary | ICD-10-CM | POA: Diagnosis not present

## 2023-11-30 DIAGNOSIS — M150 Primary generalized (osteo)arthritis: Secondary | ICD-10-CM | POA: Diagnosis not present

## 2023-11-30 DIAGNOSIS — Z556 Problems related to health literacy: Secondary | ICD-10-CM | POA: Diagnosis not present

## 2023-11-30 DIAGNOSIS — S6291XD Unspecified fracture of right wrist and hand, subsequent encounter for fracture with routine healing: Secondary | ICD-10-CM | POA: Diagnosis not present

## 2023-11-30 DIAGNOSIS — E782 Mixed hyperlipidemia: Secondary | ICD-10-CM | POA: Diagnosis not present

## 2023-11-30 DIAGNOSIS — S72421D Displaced fracture of lateral condyle of right femur, subsequent encounter for closed fracture with routine healing: Secondary | ICD-10-CM | POA: Diagnosis not present

## 2023-11-30 DIAGNOSIS — Z5982 Transportation insecurity: Secondary | ICD-10-CM | POA: Diagnosis not present

## 2023-11-30 DIAGNOSIS — I480 Paroxysmal atrial fibrillation: Secondary | ICD-10-CM | POA: Diagnosis not present

## 2023-11-30 DIAGNOSIS — S3289XD Fracture of other parts of pelvis, subsequent encounter for fracture with routine healing: Secondary | ICD-10-CM | POA: Diagnosis not present

## 2023-11-30 DIAGNOSIS — S42002D Fracture of unspecified part of left clavicle, subsequent encounter for fracture with routine healing: Secondary | ICD-10-CM | POA: Diagnosis not present

## 2023-11-30 DIAGNOSIS — S2242XD Multiple fractures of ribs, left side, subsequent encounter for fracture with routine healing: Secondary | ICD-10-CM | POA: Diagnosis not present

## 2023-11-30 DIAGNOSIS — I10 Essential (primary) hypertension: Secondary | ICD-10-CM | POA: Diagnosis not present

## 2023-11-30 DIAGNOSIS — E039 Hypothyroidism, unspecified: Secondary | ICD-10-CM | POA: Diagnosis not present

## 2023-11-30 DIAGNOSIS — E559 Vitamin D deficiency, unspecified: Secondary | ICD-10-CM | POA: Diagnosis not present

## 2023-11-30 DIAGNOSIS — Z8673 Personal history of transient ischemic attack (TIA), and cerebral infarction without residual deficits: Secondary | ICD-10-CM | POA: Diagnosis not present

## 2023-11-30 DIAGNOSIS — Z79891 Long term (current) use of opiate analgesic: Secondary | ICD-10-CM | POA: Diagnosis not present

## 2023-11-30 DIAGNOSIS — D509 Iron deficiency anemia, unspecified: Secondary | ICD-10-CM | POA: Diagnosis not present

## 2023-11-30 DIAGNOSIS — Z85820 Personal history of malignant melanoma of skin: Secondary | ICD-10-CM | POA: Diagnosis not present

## 2023-12-03 DIAGNOSIS — Z556 Problems related to health literacy: Secondary | ICD-10-CM | POA: Diagnosis not present

## 2023-12-03 DIAGNOSIS — M150 Primary generalized (osteo)arthritis: Secondary | ICD-10-CM | POA: Diagnosis not present

## 2023-12-03 DIAGNOSIS — Z85820 Personal history of malignant melanoma of skin: Secondary | ICD-10-CM | POA: Diagnosis not present

## 2023-12-03 DIAGNOSIS — Z5982 Transportation insecurity: Secondary | ICD-10-CM | POA: Diagnosis not present

## 2023-12-03 DIAGNOSIS — S72411D Displaced unspecified condyle fracture of lower end of right femur, subsequent encounter for closed fracture with routine healing: Secondary | ICD-10-CM | POA: Diagnosis not present

## 2023-12-03 DIAGNOSIS — S2242XD Multiple fractures of ribs, left side, subsequent encounter for fracture with routine healing: Secondary | ICD-10-CM | POA: Diagnosis not present

## 2023-12-03 DIAGNOSIS — I10 Essential (primary) hypertension: Secondary | ICD-10-CM | POA: Diagnosis not present

## 2023-12-03 DIAGNOSIS — S42022D Displaced fracture of shaft of left clavicle, subsequent encounter for fracture with routine healing: Secondary | ICD-10-CM | POA: Diagnosis not present

## 2023-12-03 DIAGNOSIS — S42002D Fracture of unspecified part of left clavicle, subsequent encounter for fracture with routine healing: Secondary | ICD-10-CM | POA: Diagnosis not present

## 2023-12-03 DIAGNOSIS — D509 Iron deficiency anemia, unspecified: Secondary | ICD-10-CM | POA: Diagnosis not present

## 2023-12-03 DIAGNOSIS — E782 Mixed hyperlipidemia: Secondary | ICD-10-CM | POA: Diagnosis not present

## 2023-12-03 DIAGNOSIS — I480 Paroxysmal atrial fibrillation: Secondary | ICD-10-CM | POA: Diagnosis not present

## 2023-12-03 DIAGNOSIS — Z79891 Long term (current) use of opiate analgesic: Secondary | ICD-10-CM | POA: Diagnosis not present

## 2023-12-03 DIAGNOSIS — Z8673 Personal history of transient ischemic attack (TIA), and cerebral infarction without residual deficits: Secondary | ICD-10-CM | POA: Diagnosis not present

## 2023-12-03 DIAGNOSIS — E559 Vitamin D deficiency, unspecified: Secondary | ICD-10-CM | POA: Diagnosis not present

## 2023-12-03 DIAGNOSIS — Z7901 Long term (current) use of anticoagulants: Secondary | ICD-10-CM | POA: Diagnosis not present

## 2023-12-03 DIAGNOSIS — E039 Hypothyroidism, unspecified: Secondary | ICD-10-CM | POA: Diagnosis not present

## 2023-12-03 DIAGNOSIS — M8580 Other specified disorders of bone density and structure, unspecified site: Secondary | ICD-10-CM | POA: Diagnosis not present

## 2023-12-03 DIAGNOSIS — S72421D Displaced fracture of lateral condyle of right femur, subsequent encounter for closed fracture with routine healing: Secondary | ICD-10-CM | POA: Diagnosis not present

## 2023-12-03 DIAGNOSIS — S3289XD Fracture of other parts of pelvis, subsequent encounter for fracture with routine healing: Secondary | ICD-10-CM | POA: Diagnosis not present

## 2023-12-03 DIAGNOSIS — S6291XD Unspecified fracture of right wrist and hand, subsequent encounter for fracture with routine healing: Secondary | ICD-10-CM | POA: Diagnosis not present

## 2023-12-03 DIAGNOSIS — S32811D Multiple fractures of pelvis with unstable disruption of pelvic ring, subsequent encounter for fracture with routine healing: Secondary | ICD-10-CM | POA: Diagnosis not present

## 2023-12-05 DIAGNOSIS — E782 Mixed hyperlipidemia: Secondary | ICD-10-CM | POA: Diagnosis not present

## 2023-12-05 DIAGNOSIS — S3289XD Fracture of other parts of pelvis, subsequent encounter for fracture with routine healing: Secondary | ICD-10-CM | POA: Diagnosis not present

## 2023-12-05 DIAGNOSIS — S72421D Displaced fracture of lateral condyle of right femur, subsequent encounter for closed fracture with routine healing: Secondary | ICD-10-CM | POA: Diagnosis not present

## 2023-12-05 DIAGNOSIS — S2242XD Multiple fractures of ribs, left side, subsequent encounter for fracture with routine healing: Secondary | ICD-10-CM | POA: Diagnosis not present

## 2023-12-05 DIAGNOSIS — E559 Vitamin D deficiency, unspecified: Secondary | ICD-10-CM | POA: Diagnosis not present

## 2023-12-05 DIAGNOSIS — E039 Hypothyroidism, unspecified: Secondary | ICD-10-CM | POA: Diagnosis not present

## 2023-12-05 DIAGNOSIS — M8580 Other specified disorders of bone density and structure, unspecified site: Secondary | ICD-10-CM | POA: Diagnosis not present

## 2023-12-05 DIAGNOSIS — Z7901 Long term (current) use of anticoagulants: Secondary | ICD-10-CM | POA: Diagnosis not present

## 2023-12-05 DIAGNOSIS — M150 Primary generalized (osteo)arthritis: Secondary | ICD-10-CM | POA: Diagnosis not present

## 2023-12-05 DIAGNOSIS — Z85820 Personal history of malignant melanoma of skin: Secondary | ICD-10-CM | POA: Diagnosis not present

## 2023-12-05 DIAGNOSIS — I480 Paroxysmal atrial fibrillation: Secondary | ICD-10-CM | POA: Diagnosis not present

## 2023-12-05 DIAGNOSIS — Z556 Problems related to health literacy: Secondary | ICD-10-CM | POA: Diagnosis not present

## 2023-12-05 DIAGNOSIS — Z79891 Long term (current) use of opiate analgesic: Secondary | ICD-10-CM | POA: Diagnosis not present

## 2023-12-05 DIAGNOSIS — D509 Iron deficiency anemia, unspecified: Secondary | ICD-10-CM | POA: Diagnosis not present

## 2023-12-05 DIAGNOSIS — I10 Essential (primary) hypertension: Secondary | ICD-10-CM | POA: Diagnosis not present

## 2023-12-05 DIAGNOSIS — Z5982 Transportation insecurity: Secondary | ICD-10-CM | POA: Diagnosis not present

## 2023-12-05 DIAGNOSIS — S42002D Fracture of unspecified part of left clavicle, subsequent encounter for fracture with routine healing: Secondary | ICD-10-CM | POA: Diagnosis not present

## 2023-12-05 DIAGNOSIS — Z8673 Personal history of transient ischemic attack (TIA), and cerebral infarction without residual deficits: Secondary | ICD-10-CM | POA: Diagnosis not present

## 2023-12-05 DIAGNOSIS — S6291XD Unspecified fracture of right wrist and hand, subsequent encounter for fracture with routine healing: Secondary | ICD-10-CM | POA: Diagnosis not present

## 2023-12-07 ENCOUNTER — Ambulatory Visit
Admission: RE | Admit: 2023-12-07 | Discharge: 2023-12-07 | Disposition: A | Discharge status: Home / Self Care | Source: Ambulatory Visit | Attending: Medical | Admitting: Medical

## 2023-12-07 ENCOUNTER — Encounter: Payer: Self-pay | Admitting: Nurse Practitioner

## 2023-12-07 ENCOUNTER — Ambulatory Visit (INDEPENDENT_AMBULATORY_CARE_PROVIDER_SITE_OTHER): Admitting: Nurse Practitioner

## 2023-12-07 DIAGNOSIS — I10 Essential (primary) hypertension: Secondary | ICD-10-CM

## 2023-12-07 DIAGNOSIS — J939 Pneumothorax, unspecified: Secondary | ICD-10-CM | POA: Insufficient documentation

## 2023-12-07 DIAGNOSIS — I4819 Other persistent atrial fibrillation: Secondary | ICD-10-CM

## 2023-12-07 DIAGNOSIS — J90 Pleural effusion, not elsewhere classified: Secondary | ICD-10-CM | POA: Diagnosis not present
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
## 2023-12-07 NOTE — Assessment & Plan Note (Signed)
 Blood pressure readings at home have been variable, with occasional readings in the 150s. Current medications include amlodipine  and losartan . Explained the pharmacokinetics of losartan  and amlodipine , noting losartan  peaks in 1-2 hours and amlodipine  in 6-12 hours. Plan to adjust losartan  dosage to improve blood pressure control while monitoring for side effects such as dizziness or hypotension. - Increase losartan  to 1.5 tablets daily. - Continue amlodipine  at current dose. - Monitor blood pressure two to three times a week, avoiding measurements during pain or after caffeine intake. - Report if blood pressure consistently exceeds 150 mmHg or if symptoms of dizziness occur.

## 2023-12-07 NOTE — Patient Instructions (Addendum)
 Plan from today:  Blood Pressure Continue Amlodipine  5mg  once a day (1 tablet)  and  Losartan  50mg  once a day (1 tablet)  Check your blood pressures at home 2-3 days a week, around the same time of day, when calm and not in pain.  Also check if you have any dizziness or weakness.  If you see that you are running higher than 150 most days, please send me a message or call the office.   Collapsed Lung There is an order for a repeat Chest X-ray to make sure the lung is improving.  You can go to [facility] at [employer] and have this done at [employer].   Continue to do your deep breathing exercises.
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
## 2023-12-07 NOTE — Assessment & Plan Note (Signed)
 On Tikosyn  and Eliquis  for atrial fibrillation. Heart rate is well-controlled with no concerning symptoms.
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
## 2023-12-12 DIAGNOSIS — E039 Hypothyroidism, unspecified: Secondary | ICD-10-CM | POA: Diagnosis not present

## 2023-12-12 DIAGNOSIS — M150 Primary generalized (osteo)arthritis: Secondary | ICD-10-CM | POA: Diagnosis not present

## 2023-12-12 DIAGNOSIS — E559 Vitamin D deficiency, unspecified: Secondary | ICD-10-CM | POA: Diagnosis not present

## 2023-12-12 DIAGNOSIS — S42002D Fracture of unspecified part of left clavicle, subsequent encounter for fracture with routine healing: Secondary | ICD-10-CM | POA: Diagnosis not present

## 2023-12-12 DIAGNOSIS — D509 Iron deficiency anemia, unspecified: Secondary | ICD-10-CM | POA: Diagnosis not present

## 2023-12-12 DIAGNOSIS — S72421D Displaced fracture of lateral condyle of right femur, subsequent encounter for closed fracture with routine healing: Secondary | ICD-10-CM | POA: Diagnosis not present

## 2023-12-12 DIAGNOSIS — Z8673 Personal history of transient ischemic attack (TIA), and cerebral infarction without residual deficits: Secondary | ICD-10-CM | POA: Diagnosis not present

## 2023-12-12 DIAGNOSIS — S2242XD Multiple fractures of ribs, left side, subsequent encounter for fracture with routine healing: Secondary | ICD-10-CM | POA: Diagnosis not present

## 2023-12-12 DIAGNOSIS — S3289XD Fracture of other parts of pelvis, subsequent encounter for fracture with routine healing: Secondary | ICD-10-CM | POA: Diagnosis not present

## 2023-12-12 DIAGNOSIS — Z7901 Long term (current) use of anticoagulants: Secondary | ICD-10-CM | POA: Diagnosis not present

## 2023-12-12 DIAGNOSIS — Z85820 Personal history of malignant melanoma of skin: Secondary | ICD-10-CM | POA: Diagnosis not present

## 2023-12-12 DIAGNOSIS — Z79891 Long term (current) use of opiate analgesic: Secondary | ICD-10-CM | POA: Diagnosis not present

## 2023-12-12 DIAGNOSIS — M8580 Other specified disorders of bone density and structure, unspecified site: Secondary | ICD-10-CM | POA: Diagnosis not present

## 2023-12-12 DIAGNOSIS — I10 Essential (primary) hypertension: Secondary | ICD-10-CM | POA: Diagnosis not present

## 2023-12-12 DIAGNOSIS — E782 Mixed hyperlipidemia: Secondary | ICD-10-CM | POA: Diagnosis not present

## 2023-12-12 DIAGNOSIS — Z556 Problems related to health literacy: Secondary | ICD-10-CM | POA: Diagnosis not present

## 2023-12-12 DIAGNOSIS — I480 Paroxysmal atrial fibrillation: Secondary | ICD-10-CM | POA: Diagnosis not present

## 2023-12-12 DIAGNOSIS — S6291XD Unspecified fracture of right wrist and hand, subsequent encounter for fracture with routine healing: Secondary | ICD-10-CM | POA: Diagnosis not present

## 2023-12-12 DIAGNOSIS — Z5982 Transportation insecurity: Secondary | ICD-10-CM | POA: Diagnosis not present

## 2023-12-13 ENCOUNTER — Other Ambulatory Visit: Payer: Self-pay | Admitting: Internal Medicine

## 2023-12-13 DIAGNOSIS — R9389 Abnormal findings on diagnostic imaging of other specified body structures: Secondary | ICD-10-CM
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
## 2023-12-15 DIAGNOSIS — R2689 Other abnormalities of gait and mobility: Secondary | ICD-10-CM | POA: Diagnosis not present

## 2023-12-15 DIAGNOSIS — M6281 Muscle weakness (generalized): Secondary | ICD-10-CM | POA: Diagnosis not present

## 2023-12-15 DIAGNOSIS — S42002A Fracture of unspecified part of left clavicle, initial encounter for closed fracture: Secondary | ICD-10-CM | POA: Diagnosis not present

## 2023-12-15 DIAGNOSIS — R262 Difficulty in walking, not elsewhere classified: Secondary | ICD-10-CM | POA: Diagnosis not present

## 2023-12-16 DIAGNOSIS — S6291XD Unspecified fracture of right wrist and hand, subsequent encounter for fracture with routine healing: Secondary | ICD-10-CM | POA: Diagnosis not present

## 2023-12-16 DIAGNOSIS — E782 Mixed hyperlipidemia: Secondary | ICD-10-CM | POA: Diagnosis not present

## 2023-12-16 DIAGNOSIS — S72421D Displaced fracture of lateral condyle of right femur, subsequent encounter for closed fracture with routine healing: Secondary | ICD-10-CM | POA: Diagnosis not present

## 2023-12-16 DIAGNOSIS — Z5982 Transportation insecurity: Secondary | ICD-10-CM | POA: Diagnosis not present

## 2023-12-16 DIAGNOSIS — E039 Hypothyroidism, unspecified: Secondary | ICD-10-CM | POA: Diagnosis not present

## 2023-12-16 DIAGNOSIS — S42002D Fracture of unspecified part of left clavicle, subsequent encounter for fracture with routine healing: Secondary | ICD-10-CM | POA: Diagnosis not present

## 2023-12-16 DIAGNOSIS — I480 Paroxysmal atrial fibrillation: Secondary | ICD-10-CM | POA: Diagnosis not present

## 2023-12-16 DIAGNOSIS — M150 Primary generalized (osteo)arthritis: Secondary | ICD-10-CM | POA: Diagnosis not present

## 2023-12-16 DIAGNOSIS — D509 Iron deficiency anemia, unspecified: Secondary | ICD-10-CM | POA: Diagnosis not present

## 2023-12-16 DIAGNOSIS — S3289XD Fracture of other parts of pelvis, subsequent encounter for fracture with routine healing: Secondary | ICD-10-CM | POA: Diagnosis not present

## 2023-12-16 DIAGNOSIS — Z556 Problems related to health literacy: Secondary | ICD-10-CM | POA: Diagnosis not present

## 2023-12-16 DIAGNOSIS — S2242XD Multiple fractures of ribs, left side, subsequent encounter for fracture with routine healing: Secondary | ICD-10-CM | POA: Diagnosis not present

## 2023-12-16 DIAGNOSIS — Z79891 Long term (current) use of opiate analgesic: Secondary | ICD-10-CM | POA: Diagnosis not present

## 2023-12-16 DIAGNOSIS — Z85820 Personal history of malignant melanoma of skin: Secondary | ICD-10-CM | POA: Diagnosis not present

## 2023-12-16 DIAGNOSIS — M8580 Other specified disorders of bone density and structure, unspecified site: Secondary | ICD-10-CM | POA: Diagnosis not present

## 2023-12-16 DIAGNOSIS — Z8673 Personal history of transient ischemic attack (TIA), and cerebral infarction without residual deficits: Secondary | ICD-10-CM | POA: Diagnosis not present

## 2023-12-16 DIAGNOSIS — E559 Vitamin D deficiency, unspecified: Secondary | ICD-10-CM | POA: Diagnosis not present

## 2023-12-16 DIAGNOSIS — Z7901 Long term (current) use of anticoagulants: Secondary | ICD-10-CM | POA: Diagnosis not present

## 2023-12-19 DIAGNOSIS — S72421D Displaced fracture of lateral condyle of right femur, subsequent encounter for closed fracture with routine healing: Secondary | ICD-10-CM | POA: Diagnosis not present

## 2023-12-19 DIAGNOSIS — S3289XD Fracture of other parts of pelvis, subsequent encounter for fracture with routine healing: Secondary | ICD-10-CM | POA: Diagnosis not present

## 2023-12-19 DIAGNOSIS — I480 Paroxysmal atrial fibrillation: Secondary | ICD-10-CM | POA: Diagnosis not present

## 2023-12-19 DIAGNOSIS — E782 Mixed hyperlipidemia: Secondary | ICD-10-CM | POA: Diagnosis not present

## 2023-12-19 DIAGNOSIS — S6291XD Unspecified fracture of right wrist and hand, subsequent encounter for fracture with routine healing: Secondary | ICD-10-CM | POA: Diagnosis not present

## 2023-12-19 DIAGNOSIS — Z85820 Personal history of malignant melanoma of skin: Secondary | ICD-10-CM | POA: Diagnosis not present

## 2023-12-19 DIAGNOSIS — Z7901 Long term (current) use of anticoagulants: Secondary | ICD-10-CM | POA: Diagnosis not present

## 2023-12-19 DIAGNOSIS — Z556 Problems related to health literacy: Secondary | ICD-10-CM | POA: Diagnosis not present

## 2023-12-19 DIAGNOSIS — Z79891 Long term (current) use of opiate analgesic: Secondary | ICD-10-CM | POA: Diagnosis not present

## 2023-12-19 DIAGNOSIS — M150 Primary generalized (osteo)arthritis: Secondary | ICD-10-CM | POA: Diagnosis not present

## 2023-12-19 DIAGNOSIS — Z5982 Transportation insecurity: Secondary | ICD-10-CM | POA: Diagnosis not present

## 2023-12-19 DIAGNOSIS — S42002D Fracture of unspecified part of left clavicle, subsequent encounter for fracture with routine healing: Secondary | ICD-10-CM | POA: Diagnosis not present

## 2023-12-19 DIAGNOSIS — I10 Essential (primary) hypertension: Secondary | ICD-10-CM | POA: Diagnosis not present

## 2023-12-19 DIAGNOSIS — M8580 Other specified disorders of bone density and structure, unspecified site: Secondary | ICD-10-CM | POA: Diagnosis not present

## 2023-12-19 DIAGNOSIS — E559 Vitamin D deficiency, unspecified: Secondary | ICD-10-CM | POA: Diagnosis not present

## 2023-12-19 DIAGNOSIS — Z8673 Personal history of transient ischemic attack (TIA), and cerebral infarction without residual deficits: Secondary | ICD-10-CM | POA: Diagnosis not present

## 2023-12-19 DIAGNOSIS — S2242XD Multiple fractures of ribs, left side, subsequent encounter for fracture with routine healing: Secondary | ICD-10-CM | POA: Diagnosis not present

## 2023-12-19 DIAGNOSIS — D509 Iron deficiency anemia, unspecified: Secondary | ICD-10-CM | POA: Diagnosis not present

## 2023-12-19 DIAGNOSIS — E039 Hypothyroidism, unspecified: Secondary | ICD-10-CM | POA: Diagnosis not present

## 2023-12-26 ENCOUNTER — Telehealth: Payer: Self-pay

## 2023-12-26 DIAGNOSIS — M8580 Other specified disorders of bone density and structure, unspecified site: Secondary | ICD-10-CM | POA: Diagnosis not present

## 2023-12-26 DIAGNOSIS — Z556 Problems related to health literacy: Secondary | ICD-10-CM | POA: Diagnosis not present

## 2023-12-26 DIAGNOSIS — D509 Iron deficiency anemia, unspecified: Secondary | ICD-10-CM | POA: Diagnosis not present

## 2023-12-26 DIAGNOSIS — Z85820 Personal history of malignant melanoma of skin: Secondary | ICD-10-CM | POA: Diagnosis not present

## 2023-12-26 DIAGNOSIS — E559 Vitamin D deficiency, unspecified: Secondary | ICD-10-CM | POA: Diagnosis not present

## 2023-12-26 DIAGNOSIS — S6291XD Unspecified fracture of right wrist and hand, subsequent encounter for fracture with routine healing: Secondary | ICD-10-CM | POA: Diagnosis not present

## 2023-12-26 DIAGNOSIS — Z8673 Personal history of transient ischemic attack (TIA), and cerebral infarction without residual deficits: Secondary | ICD-10-CM | POA: Diagnosis not present

## 2023-12-26 DIAGNOSIS — E039 Hypothyroidism, unspecified: Secondary | ICD-10-CM | POA: Diagnosis not present

## 2023-12-26 DIAGNOSIS — I480 Paroxysmal atrial fibrillation: Secondary | ICD-10-CM | POA: Diagnosis not present

## 2023-12-26 DIAGNOSIS — Z7901 Long term (current) use of anticoagulants: Secondary | ICD-10-CM | POA: Diagnosis not present

## 2023-12-26 DIAGNOSIS — S72421D Displaced fracture of lateral condyle of right femur, subsequent encounter for closed fracture with routine healing: Secondary | ICD-10-CM | POA: Diagnosis not present

## 2023-12-26 DIAGNOSIS — S42002D Fracture of unspecified part of left clavicle, subsequent encounter for fracture with routine healing: Secondary | ICD-10-CM | POA: Diagnosis not present

## 2023-12-26 DIAGNOSIS — M150 Primary generalized (osteo)arthritis: Secondary | ICD-10-CM | POA: Diagnosis not present

## 2023-12-26 DIAGNOSIS — S3289XD Fracture of other parts of pelvis, subsequent encounter for fracture with routine healing: Secondary | ICD-10-CM | POA: Diagnosis not present

## 2023-12-26 DIAGNOSIS — I10 Essential (primary) hypertension: Secondary | ICD-10-CM | POA: Diagnosis not present

## 2023-12-26 DIAGNOSIS — S2242XD Multiple fractures of ribs, left side, subsequent encounter for fracture with routine healing: Secondary | ICD-10-CM | POA: Diagnosis not present

## 2023-12-26 DIAGNOSIS — Z5982 Transportation insecurity: Secondary | ICD-10-CM | POA: Diagnosis not present

## 2023-12-26 DIAGNOSIS — Z79891 Long term (current) use of opiate analgesic: Secondary | ICD-10-CM | POA: Diagnosis not present

## 2023-12-26 DIAGNOSIS — E782 Mixed hyperlipidemia: Secondary | ICD-10-CM | POA: Diagnosis not present

## 2023-12-26 NOTE — Telephone Encounter (Signed)
 Copied from CRM [id number]. Topic: Clinical - Home Health Verbal Orders >> Dec 26, 2023 12:31 PM Jasmin G wrote: Caller/Agency: Mrs. Deane, Dilkon. Callback Number: [phone]. Service Requested: Verbal orders for Physical Therapy change of frequency and evaluation for occupational therapy. Frequency: To be discussed. Any new concerns about the patient? No

## 2023-12-28 ENCOUNTER — Telehealth: Payer: Self-pay

## 2023-12-28 NOTE — Telephone Encounter (Signed)
 Pt. Will need an appointment to discuss can be virtual per Camie Hun.   Copied from CRM [id number]. Topic: General - Other >> Dec 28, 2023  9:45 AM Marissa P wrote: Reason for CRM: Patient would like a do not resuscitate sticker sent to her to put on her refrigerator.

## 2024-01-02 DIAGNOSIS — Z5982 Transportation insecurity: Secondary | ICD-10-CM | POA: Diagnosis not present

## 2024-01-02 DIAGNOSIS — M150 Primary generalized (osteo)arthritis: Secondary | ICD-10-CM | POA: Diagnosis not present

## 2024-01-02 DIAGNOSIS — E039 Hypothyroidism, unspecified: Secondary | ICD-10-CM | POA: Diagnosis not present

## 2024-01-02 DIAGNOSIS — Z8673 Personal history of transient ischemic attack (TIA), and cerebral infarction without residual deficits: Secondary | ICD-10-CM | POA: Diagnosis not present

## 2024-01-02 DIAGNOSIS — S3289XD Fracture of other parts of pelvis, subsequent encounter for fracture with routine healing: Secondary | ICD-10-CM | POA: Diagnosis not present

## 2024-01-02 DIAGNOSIS — M8580 Other specified disorders of bone density and structure, unspecified site: Secondary | ICD-10-CM | POA: Diagnosis not present

## 2024-01-02 DIAGNOSIS — S42002D Fracture of unspecified part of left clavicle, subsequent encounter for fracture with routine healing: Secondary | ICD-10-CM | POA: Diagnosis not present

## 2024-01-02 DIAGNOSIS — E559 Vitamin D deficiency, unspecified: Secondary | ICD-10-CM | POA: Diagnosis not present

## 2024-01-02 DIAGNOSIS — S72421D Displaced fracture of lateral condyle of right femur, subsequent encounter for closed fracture with routine healing: Secondary | ICD-10-CM | POA: Diagnosis not present

## 2024-01-02 DIAGNOSIS — I10 Essential (primary) hypertension: Secondary | ICD-10-CM | POA: Diagnosis not present

## 2024-01-02 DIAGNOSIS — I480 Paroxysmal atrial fibrillation: Secondary | ICD-10-CM | POA: Diagnosis not present

## 2024-01-02 DIAGNOSIS — E782 Mixed hyperlipidemia: Secondary | ICD-10-CM | POA: Diagnosis not present

## 2024-01-02 DIAGNOSIS — Z556 Problems related to health literacy: Secondary | ICD-10-CM | POA: Diagnosis not present

## 2024-01-02 DIAGNOSIS — S6291XD Unspecified fracture of right wrist and hand, subsequent encounter for fracture with routine healing: Secondary | ICD-10-CM | POA: Diagnosis not present

## 2024-01-02 DIAGNOSIS — Z79891 Long term (current) use of opiate analgesic: Secondary | ICD-10-CM | POA: Diagnosis not present

## 2024-01-02 DIAGNOSIS — Z85820 Personal history of malignant melanoma of skin: Secondary | ICD-10-CM | POA: Diagnosis not present

## 2024-01-02 DIAGNOSIS — S2242XD Multiple fractures of ribs, left side, subsequent encounter for fracture with routine healing: Secondary | ICD-10-CM | POA: Diagnosis not present

## 2024-01-02 DIAGNOSIS — Z7901 Long term (current) use of anticoagulants: Secondary | ICD-10-CM | POA: Diagnosis not present

## 2024-01-02 DIAGNOSIS — D509 Iron deficiency anemia, unspecified: Secondary | ICD-10-CM | POA: Diagnosis not present

## 2024-01-03 DIAGNOSIS — Z1231 Encounter for screening mammogram for malignant neoplasm of breast: Secondary | ICD-10-CM | POA: Diagnosis not present

## 2024-01-03 LAB — HM MAMMOGRAPHY

## 2024-01-08 DIAGNOSIS — L578 Other skin changes due to chronic exposure to nonionizing radiation: Secondary | ICD-10-CM | POA: Diagnosis not present

## 2024-01-08 DIAGNOSIS — Z86018 Personal history of other benign neoplasm: Secondary | ICD-10-CM | POA: Diagnosis not present

## 2024-01-08 DIAGNOSIS — Z85828 Personal history of other malignant neoplasm of skin: Secondary | ICD-10-CM | POA: Diagnosis not present

## 2024-01-08 DIAGNOSIS — L409 Psoriasis, unspecified: Secondary | ICD-10-CM | POA: Diagnosis not present

## 2024-01-08 DIAGNOSIS — L570 Actinic keratosis: Secondary | ICD-10-CM | POA: Diagnosis not present

## 2024-01-08 DIAGNOSIS — Z808 Family history of malignant neoplasm of other organs or systems: Secondary | ICD-10-CM | POA: Diagnosis not present

## 2024-01-08 DIAGNOSIS — L821 Other seborrheic keratosis: Secondary | ICD-10-CM | POA: Diagnosis not present

## 2024-01-08 DIAGNOSIS — M6740 Ganglion, unspecified site: Secondary | ICD-10-CM | POA: Diagnosis not present

## 2024-01-08 DIAGNOSIS — Z87898 Personal history of other specified conditions: Secondary | ICD-10-CM | POA: Diagnosis not present

## 2024-01-08 DIAGNOSIS — L820 Inflamed seborrheic keratosis: Secondary | ICD-10-CM | POA: Diagnosis not present

## 2024-01-08 DIAGNOSIS — D225 Melanocytic nevi of trunk: Secondary | ICD-10-CM | POA: Diagnosis not present

## 2024-01-09 ENCOUNTER — Other Ambulatory Visit: Payer: Self-pay | Admitting: Nurse Practitioner

## 2024-01-09 ENCOUNTER — Other Ambulatory Visit: Payer: Self-pay

## 2024-01-09 MED ORDER — AMLODIPINE BESYLATE 5 MG PO TABS: 5.0000 mg | ORAL_TABLET | Freq: Every day | ORAL | 0 refills | Status: DC

## 2024-01-09 MED ORDER — AMLODIPINE BESYLATE 5 MG PO TABS: 5.0000 mg | ORAL_TABLET | Freq: Every day | ORAL | Status: DC

## 2024-01-09 NOTE — Telephone Encounter (Signed)
 Copied from CRM [id number]. Topic: Clinical - Medication Refill >> Jan 09, 2024  9:01 AM Willma R wrote: Medication: amLODipine  (NORVASC ) 5 MG tablet  Has the patient contacted their pharmacy? Yes, call dr  This is the patient's preferred pharmacy:  [id number] [place], [place] - [phone] AT [facility] OF [facility] & [address] [place] [address] Phone: [phone] Fax: [phone]  Is this the correct pharmacy for this prescription? Yes If no, delete pharmacy and type the correct one.   Has the prescription been filled recently? No  Is the patient out of the medication? Yes  Has the patient been seen for an appointment in the last year OR does the patient have an upcoming appointment? Yes  Can we respond through MyChart? Yes  Agent: Please be advised that Rx refills may take up to 3 business days. We ask that you follow-up with your pharmacy.

## 2024-01-10 DIAGNOSIS — Z556 Problems related to health literacy: Secondary | ICD-10-CM | POA: Diagnosis not present

## 2024-01-10 DIAGNOSIS — S6291XD Unspecified fracture of right wrist and hand, subsequent encounter for fracture with routine healing: Secondary | ICD-10-CM | POA: Diagnosis not present

## 2024-01-10 DIAGNOSIS — I10 Essential (primary) hypertension: Secondary | ICD-10-CM | POA: Diagnosis not present

## 2024-01-10 DIAGNOSIS — I480 Paroxysmal atrial fibrillation: Secondary | ICD-10-CM | POA: Diagnosis not present

## 2024-01-10 DIAGNOSIS — Z7901 Long term (current) use of anticoagulants: Secondary | ICD-10-CM | POA: Diagnosis not present

## 2024-01-10 DIAGNOSIS — S42002D Fracture of unspecified part of left clavicle, subsequent encounter for fracture with routine healing: Secondary | ICD-10-CM | POA: Diagnosis not present

## 2024-01-10 DIAGNOSIS — Z79891 Long term (current) use of opiate analgesic: Secondary | ICD-10-CM | POA: Diagnosis not present

## 2024-01-10 DIAGNOSIS — S3289XD Fracture of other parts of pelvis, subsequent encounter for fracture with routine healing: Secondary | ICD-10-CM | POA: Diagnosis not present

## 2024-01-10 DIAGNOSIS — E039 Hypothyroidism, unspecified: Secondary | ICD-10-CM | POA: Diagnosis not present

## 2024-01-10 DIAGNOSIS — M150 Primary generalized (osteo)arthritis: Secondary | ICD-10-CM | POA: Diagnosis not present

## 2024-01-10 DIAGNOSIS — Z8673 Personal history of transient ischemic attack (TIA), and cerebral infarction without residual deficits: Secondary | ICD-10-CM | POA: Diagnosis not present

## 2024-01-10 DIAGNOSIS — D509 Iron deficiency anemia, unspecified: Secondary | ICD-10-CM | POA: Diagnosis not present

## 2024-01-10 DIAGNOSIS — E559 Vitamin D deficiency, unspecified: Secondary | ICD-10-CM | POA: Diagnosis not present

## 2024-01-10 DIAGNOSIS — Z85820 Personal history of malignant melanoma of skin: Secondary | ICD-10-CM | POA: Diagnosis not present

## 2024-01-10 DIAGNOSIS — M8580 Other specified disorders of bone density and structure, unspecified site: Secondary | ICD-10-CM | POA: Diagnosis not present

## 2024-01-10 DIAGNOSIS — S72421D Displaced fracture of lateral condyle of right femur, subsequent encounter for closed fracture with routine healing: Secondary | ICD-10-CM | POA: Diagnosis not present

## 2024-01-10 DIAGNOSIS — S2242XD Multiple fractures of ribs, left side, subsequent encounter for fracture with routine healing: Secondary | ICD-10-CM | POA: Diagnosis not present

## 2024-01-10 DIAGNOSIS — Z5982 Transportation insecurity: Secondary | ICD-10-CM | POA: Diagnosis not present

## 2024-01-10 DIAGNOSIS — E782 Mixed hyperlipidemia: Secondary | ICD-10-CM | POA: Diagnosis not present

## 2024-01-14 ENCOUNTER — Telehealth (INDEPENDENT_AMBULATORY_CARE_PROVIDER_SITE_OTHER): Admitting: Nurse Practitioner

## 2024-01-14 ENCOUNTER — Encounter: Payer: Self-pay | Admitting: Nurse Practitioner

## 2024-01-14 VITALS — Wt 111.0 lb

## 2024-01-14 DIAGNOSIS — I10 Essential (primary) hypertension: Secondary | ICD-10-CM

## 2024-01-14 DIAGNOSIS — I480 Paroxysmal atrial fibrillation: Secondary | ICD-10-CM | POA: Diagnosis not present

## 2024-01-14 DIAGNOSIS — S329XXA Fracture of unspecified parts of lumbosacral spine and pelvis, initial encounter for closed fracture: Secondary | ICD-10-CM

## 2024-01-14 DIAGNOSIS — L409 Psoriasis, unspecified: Secondary | ICD-10-CM | POA: Diagnosis not present

## 2024-01-14 DIAGNOSIS — Z8589 Personal history of malignant neoplasm of other organs and systems: Secondary | ICD-10-CM | POA: Insufficient documentation

## 2024-01-14 DIAGNOSIS — Z23 Encounter for immunization: Secondary | ICD-10-CM | POA: Insufficient documentation

## 2024-01-14 DIAGNOSIS — Z7189 Other specified counseling: Secondary | ICD-10-CM | POA: Diagnosis not present

## 2024-01-14 DIAGNOSIS — Z86006 Personal history of melanoma in-situ: Secondary | ICD-10-CM | POA: Insufficient documentation

## 2024-01-14 NOTE — Assessment & Plan Note (Signed)
 She has expressed a clear understanding and desire to have a Do Not Resuscitate (DNR) order in place. She was informed about the implications of a DNR order, including the absence of life support measures such as CPR, and she agrees with this decision. A yellow DNR order was completed today for her and will be mailed.  - Mail DNR form to her for placement on refrigerator.

## 2024-01-14 NOTE — Progress Notes (Signed)
 Virtual Visit Encounter telephone visit.  Converted to telephone due to difficulties with volume on patients end.   I connected with  Ronal MARLA Lav on 01/14/24 at  2:30 PM EDT by secure audio telemedicine application. I verified that I am speaking with the correct person using two identifiers.   I introduced myself as a Publishing rights manager with the practice. The limitations of evaluation and management by telemedicine discussed with the patient and the availability of in person appointments. The patient expressed verbal understanding and consent to proceed.  Participating parties in this visit include: Myself and patient  The patient is: Patient Location: Home I am: Provider Location: Office/Clinic Subjective:    CC and HPI: History of Present Illness ZEKIAH COEN is a 89 year old female who presents to discuss a do not resuscitate (DNR) order and follow-up on her recent hospitalization.  She is interested in establishing a do not resuscitate (DNR) order. She understands that this means she would not receive life support measures such as CPR. A bright orange sheet should be placed on her refrigerator to indicate her wishes to emergency medical services in case of an emergency.  She feels stronger each day since her recent hospitalization and is performing exercises to aid her recovery. She wants to engage in outdoor activities but acknowledges she is not yet ready. She has completed in-home physical therapy and wishes to continue with outpatient therapy at [facility] with Arvella Admire.  She is concerned about the resignation of her cardiologist, Dr. Fernande, and inquires if she needs to find a new cardiologist. She is currently taking dofetilide , losartan , Eliquis , and amlodipine . She understands the importance of cardiology follow-up for these medications.  She mentions a recent weight loss, now weighing 111 pounds, down from her usual 120 pounds. She attributes this to  possible height loss and reports eating as well as she knows how. No loss of appetite or feeling sick, but she notes recent digestive changes, including 'little grainy pellets' and increased gas, which she attributes to Otezla, a medication she is taking for psoriasis.  She describes having psoriasis on her legs, back, arms, and possibly in her hair. She has been using Otezla for two weeks without noticeable improvement and reports side effects affecting her digestive system. She has received additional two-week packets of Otezla to continue treatment.  She reports that her blood pressure has been okay and that she no longer has swelling in her legs.  She is concerned about her weight loss and mentions she may have shrunk in height.  Past medical history, Surgical history, Family history not pertinant except as noted below, Social history, Allergies, and medications have been entered into the medical record, reviewed, and corrections made.   Review of Systems:  All review of systems negative except what is listed in the HPI  Objective:    Alert and oriented x 4 Speaking in clear sentences with no shortness of breath. No distress.  Impression and Recommendations:    Problem List Items Addressed This Visit     Paroxysmal A-fib [facility])   She is on dofetilide  and anticoagulation therapy for atrial fibrillation. Continued cardiology follow-up is recommended due to the specialized nature of dofetilide  management. - Advise her to schedule annual cardiology follow-up for dofetilide  management.      Essential hypertension   Blood pressure is well-managed with current therapy. No recent episodes of peripheral edema.      Psoriasis   Psoriasis is present on legs, back,  arms, and possibly scalp. She reports adverse effects from apremilast Jud), including gastrointestinal symptoms such as gas and pellet-like stools. The medication is being continued to assess efficacy despite side effects. -  Continue apremilast (Otezla) and monitor for efficacy and side effects.       DNR (do not resuscitate) discussion - Primary   She has expressed a clear understanding and desire to have a Do Not Resuscitate (DNR) order in place. She was informed about the implications of a DNR order, including the absence of life support measures such as CPR, and she agrees with this decision. A yellow DNR order was completed today for her and will be mailed.  - Mail DNR form to her for placement on refrigerator.       Need for COVID-19 vaccine   Written order signed and faxed to [facility] for patient to have this completed.       Pelvic fracture [facility])   Relevant Orders   Ambulatory referral to Physical Therapy    current treatment plan is effective, no change in therapy I discussed the assessment and treatment plan with the patient. The patient was provided an opportunity to ask questions and all were answered. The patient agreed with the plan and demonstrated an understanding of the instructions.   The patient was advised to call back or seek an in-person evaluation if the symptoms worsen or if the condition fails to improve as anticipated.  Follow-Up: prn  I provided 22 minutes of non-face-to-face interaction with this non face-to-face encounter including intake, same-day documentation, and chart review.   Camie CHARLENA Doing, NP , DNP, AGNP-c [facility]

## 2024-01-14 NOTE — Assessment & Plan Note (Signed)
 Written order signed and faxed to [facility] for patient to have this completed.

## 2024-01-14 NOTE — Assessment & Plan Note (Signed)
 She is on dofetilide  and anticoagulation therapy for atrial fibrillation. Continued cardiology follow-up is recommended due to the specialized nature of dofetilide  management. - Advise her to schedule annual cardiology follow-up for dofetilide  management.

## 2024-01-14 NOTE — Assessment & Plan Note (Signed)
 Psoriasis is present on legs, back, arms, and possibly scalp. She reports adverse effects from apremilast Jud), including gastrointestinal symptoms such as gas and pellet-like stools. The medication is being continued to assess efficacy despite side effects. - Continue apremilast (Otezla) and monitor for efficacy and side effects.

## 2024-01-14 NOTE — Assessment & Plan Note (Signed)
 Blood pressure is well-managed with current therapy. No recent episodes of peripheral edema.

## 2024-01-14 NOTE — Patient Instructions (Signed)
 I sent the referral to the physical therapy office for you. They will call you to schedule the appointment.   I have completed the DNR form and we will mail this to you.   I will fax the order for the COVID vaccine to your pharmacy so you can get this.

## 2024-01-15 DIAGNOSIS — R2689 Other abnormalities of gait and mobility: Secondary | ICD-10-CM | POA: Diagnosis not present

## 2024-01-15 DIAGNOSIS — R262 Difficulty in walking, not elsewhere classified: Secondary | ICD-10-CM | POA: Diagnosis not present

## 2024-01-15 DIAGNOSIS — S42002A Fracture of unspecified part of left clavicle, initial encounter for closed fracture: Secondary | ICD-10-CM | POA: Diagnosis not present

## 2024-01-16 DIAGNOSIS — S32811D Multiple fractures of pelvis with unstable disruption of pelvic ring, subsequent encounter for fracture with routine healing: Secondary | ICD-10-CM | POA: Diagnosis not present

## 2024-01-17 DIAGNOSIS — M79606 Pain in leg, unspecified: Secondary | ICD-10-CM | POA: Diagnosis not present

## 2024-01-17 DIAGNOSIS — M6281 Muscle weakness (generalized): Secondary | ICD-10-CM | POA: Diagnosis not present

## 2024-01-17 DIAGNOSIS — M25519 Pain in unspecified shoulder: Secondary | ICD-10-CM | POA: Diagnosis not present

## 2024-01-24 ENCOUNTER — Encounter: Payer: Self-pay | Admitting: Pulmonary Disease

## 2024-01-24 ENCOUNTER — Ambulatory Visit: Admitting: Pulmonary Disease

## 2024-01-24 VITALS — BP 120/78 | HR 67 | Temp 98.2°F | Ht 64.0 in | Wt 114.0 lb

## 2024-01-24 DIAGNOSIS — Z87828 Personal history of other (healed) physical injury and trauma: Secondary | ICD-10-CM

## 2024-01-24 DIAGNOSIS — J90 Pleural effusion, not elsewhere classified: Secondary | ICD-10-CM | POA: Diagnosis not present

## 2024-01-24 DIAGNOSIS — Z87891 Personal history of nicotine dependence: Secondary | ICD-10-CM | POA: Diagnosis not present

## 2024-01-25 ENCOUNTER — Encounter: Payer: Self-pay | Admitting: Pulmonary Disease
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
## 2024-01-29 DIAGNOSIS — Z961 Presence of intraocular lens: Secondary | ICD-10-CM | POA: Diagnosis not present

## 2024-01-29 DIAGNOSIS — H43813 Vitreous degeneration, bilateral: Secondary | ICD-10-CM | POA: Diagnosis not present

## 2024-01-29 DIAGNOSIS — H02834 Dermatochalasis of left upper eyelid: Secondary | ICD-10-CM | POA: Diagnosis not present

## 2024-01-29 DIAGNOSIS — H02831 Dermatochalasis of right upper eyelid: Secondary | ICD-10-CM | POA: Diagnosis not present

## 2024-01-29 DIAGNOSIS — H04123 Dry eye syndrome of bilateral lacrimal glands: Secondary | ICD-10-CM | POA: Diagnosis not present

## 2024-02-04 DIAGNOSIS — M6281 Muscle weakness (generalized): Secondary | ICD-10-CM | POA: Diagnosis not present

## 2024-02-04 DIAGNOSIS — M79606 Pain in leg, unspecified: Secondary | ICD-10-CM | POA: Diagnosis not present

## 2024-02-04 DIAGNOSIS — M25519 Pain in unspecified shoulder: Secondary | ICD-10-CM | POA: Diagnosis not present

## 2024-02-08 ENCOUNTER — Ambulatory Visit: Admitting: Pulmonary Disease

## 2024-02-14 DIAGNOSIS — M6281 Muscle weakness (generalized): Secondary | ICD-10-CM | POA: Diagnosis not present

## 2024-02-14 DIAGNOSIS — R2689 Other abnormalities of gait and mobility: Secondary | ICD-10-CM | POA: Diagnosis not present

## 2024-02-14 DIAGNOSIS — R262 Difficulty in walking, not elsewhere classified: Secondary | ICD-10-CM | POA: Diagnosis not present

## 2024-02-14 DIAGNOSIS — S42002A Fracture of unspecified part of left clavicle, initial encounter for closed fracture: Secondary | ICD-10-CM | POA: Diagnosis not present

## 2024-02-23 ENCOUNTER — Other Ambulatory Visit: Payer: Self-pay | Admitting: Medical

## 2024-02-25 ENCOUNTER — Other Ambulatory Visit: Payer: Self-pay | Admitting: Nurse Practitioner

## 2024-03-10 ENCOUNTER — Encounter: Payer: Self-pay | Admitting: Radiology
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
## 2024-03-31 ENCOUNTER — Ambulatory Visit: Attending: Physician Assistant | Admitting: Physician Assistant

## 2024-03-31 VITALS — BP 163/75 | HR 70 | Ht 64.0 in | Wt 116.0 lb

## 2024-03-31 DIAGNOSIS — I4819 Other persistent atrial fibrillation: Secondary | ICD-10-CM | POA: Diagnosis not present

## 2024-03-31 DIAGNOSIS — I10 Essential (primary) hypertension: Secondary | ICD-10-CM | POA: Diagnosis not present

## 2024-03-31 DIAGNOSIS — Z79899 Other long term (current) drug therapy: Secondary | ICD-10-CM

## 2024-03-31 DIAGNOSIS — Z5181 Encounter for therapeutic drug level monitoring: Secondary | ICD-10-CM | POA: Diagnosis not present

## 2024-03-31 DIAGNOSIS — D6869 Other thrombophilia: Secondary | ICD-10-CM

## 2024-03-31 MED ORDER — LOSARTAN POTASSIUM 50 MG PO TABS: 50.0000 mg | ORAL_TABLET | Freq: Every day | ORAL | 3 refills | Status: AC

## 2024-03-31 NOTE — Patient Instructions (Signed)
 Medication Instructions:    Your physician recommends that you continue on your current medications as directed. Please refer to the Current Medication list given to you today.  *If you need a refill on your cardiac medications before your next appointment, please call your pharmacy*  Lab Work:  PLEASE GO DOWN STAIRS  [facility]  FIRST FLOOR   ( GET OFF ELEVATORS WALK TOWARDS WAITING AREA LAB LOCATED BY PHARMACY):  BMET  MAG  AND CBC TODAY    If you have labs (blood work) drawn today and your tests are completely normal, you will receive your results only by: MyChart Message (if you have MyChart) OR A paper copy in the mail If you have any lab test that is abnormal or we need to change your treatment, we will call you to review the results.    Testing/Procedures: NONE ORDERED  TODAY     Follow-Up: At [facility], you and your health needs are our priority.  As part of our continuing mission to provide you with exceptional heart care, our providers are all part of one team.  This team includes your primary Cardiologist (physician) and Advanced Practice Providers or APPs (Physician Assistants and Nurse Practitioners) who all work together to provide you with the care you need, when you need it.   Your next appointment:    4 month(s)   Provider:    Charlies Arthur, PA-C  ( CONTACT  CASSIE HALL/ ANGELINE HAMMER FOR EP SCHEDULING ISSUES )     We recommend signing up for the patient portal called MyChart.  Sign up information is provided on this After Visit Summary.  MyChart is used to connect with patients for Virtual Visits (Telemedicine).  Patients are able to view lab/test results, encounter notes, upcoming appointments, etc.  Non-urgent messages can be sent to your provider as well.   To learn more about what you can do with MyChart, go to [email or web address].   Other Instructions

## 2024-04-01 ENCOUNTER — Ambulatory Visit: Payer: Self-pay | Admitting: Physician Assistant

## 2024-04-01 LAB — CBC
Hematocrit: 33.9 % — ABNORMAL LOW (ref 34.0–46.6)
Hemoglobin: 12.2 g/dL (ref 11.1–15.9)
MCH: 36.2 pg — ABNORMAL HIGH (ref 26.6–33.0)
MCHC: 36 g/dL — ABNORMAL HIGH (ref 31.5–35.7)
MCV: 101 fL — ABNORMAL HIGH (ref 79–97)
Platelets: 322 x10E3/uL (ref 150–450)
RBC: 3.37 x10E6/uL — ABNORMAL LOW (ref 3.77–5.28)
RDW: 12.7 % (ref 11.7–15.4)
WBC: 7.1 x10E3/uL (ref 3.4–10.8)

## 2024-04-01 LAB — BASIC METABOLIC PANEL WITH GFR
BUN/Creatinine Ratio: 21 (ref 12–28)
BUN: 12 mg/dL (ref 10–36)
CO2: 23 mmol/L (ref 20–29)
Calcium: 9.6 mg/dL (ref 8.7–10.3)
Chloride: 98 mmol/L (ref 96–106)
Creatinine, Ser: 0.57 mg/dL (ref 0.57–1.00)
Glucose: 95 mg/dL (ref 70–99)
Potassium: 4.8 mmol/L (ref 3.5–5.2)
Sodium: 135 mmol/L (ref 134–144)
eGFR: 86 mL/min/1.73 (ref >59)

## 2024-04-01 LAB — MAGNESIUM: Magnesium: 1.9 mg/dL (ref 1.6–2.3)

## 2024-04-07 ENCOUNTER — Other Ambulatory Visit: Payer: Self-pay | Admitting: Nurse Practitioner

## 2024-04-21 ENCOUNTER — Encounter: Payer: Self-pay | Admitting: Nurse Practitioner

## 2024-04-21 ENCOUNTER — Ambulatory Visit: Payer: Medicare Other | Admitting: Nurse Practitioner

## 2024-04-21 VITALS — BP 110/70 | HR 67 | Ht 63.75 in | Wt 115.8 lb

## 2024-04-21 DIAGNOSIS — D649 Anemia, unspecified: Secondary | ICD-10-CM

## 2024-04-21 DIAGNOSIS — H5319 Other subjective visual disturbances: Secondary | ICD-10-CM

## 2024-04-21 DIAGNOSIS — R251 Tremor, unspecified: Secondary | ICD-10-CM | POA: Diagnosis not present

## 2024-04-21 DIAGNOSIS — M6740 Ganglion, unspecified site: Secondary | ICD-10-CM | POA: Diagnosis not present

## 2024-04-21 DIAGNOSIS — Z23 Encounter for immunization: Secondary | ICD-10-CM

## 2024-04-21 DIAGNOSIS — R09A2 Foreign body sensation, throat: Secondary | ICD-10-CM | POA: Insufficient documentation

## 2024-04-21 DIAGNOSIS — Z13820 Encounter for screening for osteoporosis: Secondary | ICD-10-CM

## 2024-04-21 DIAGNOSIS — L659 Nonscarring hair loss, unspecified: Secondary | ICD-10-CM | POA: Diagnosis not present

## 2024-04-21 DIAGNOSIS — R053 Chronic cough: Secondary | ICD-10-CM

## 2024-04-21 DIAGNOSIS — Z0000 Encounter for general adult medical examination without abnormal findings: Secondary | ICD-10-CM

## 2024-04-21 DIAGNOSIS — R202 Paresthesia of skin: Secondary | ICD-10-CM

## 2024-04-21 NOTE — Patient Instructions (Addendum)
 Try B12 vitamin to see if this helps with some of your symptoms. You can take 250-500mcg once a day.  We will check these levels today and if you need a higher supplement, I will let you know.   Let me know if the cyst on your wrist or foot get larger or become bothersome.   Call the eye doctor to let them know about the light and colors you are seeing with your eyes closed.   I put information on essential tremor on the back of the paperwork for you to review.   It looks like you are due for your bone density so they will call you to schedule that.

## 2024-04-21 NOTE — Progress Notes (Signed)
 Bone Density / DEXA Ordered/Provided today.  Tiffany Doing, DNP, AGNP-c [facility] [address] [place], [place] [address] Main Office [phone] VISIT TYPE: CPE on 04/21/2024 Today's Vitals   04/21/24 0940  BP: 110/70  Pulse: 67  Weight: 115 lb 12.8 oz (52.5 kg)  Height: 5' 3.75 (1.619 m)   Body mass index is 20.03 kg/m.  Wt Readings from Last 3 Encounters:  04/21/24 115 lb 12.8 oz (52.5 kg)  03/31/24 116 lb (52.6 kg)  01/24/24 114 lb (51.7 kg)     Subjective:  Annual Exam (CPE, not fasting, cyst on rt. Wrist lt. Big toe, trouble with vision, fucs dystrophy, psoriasis, sleeping issues, hair all falling out, tremor in lt. Hand, hitial hernia possibly, most important ones are the cysts and shaking in lt. Hand mean parkinsons, pt. Had covid at pharmacy and thinks she had a DEXA not too long ago, last DEXA I see is 2022 at [place]. )  History of Present Illness [name] is a 89 year old female who presents for her annual physical exam.   She has a cyst on her lateral right wrist, noticed around June, possibly after an accident she had. She is unsure if it has grown but states it does not cause pain or affect her strength. A similar cyst is present on her left foot on the dorsal surface, which is also painless. This also showed up in a similar time frame. She does not recall any injury that could have precipitated these. They do not appear to be changing.   She experiences a tremor in her left hand, noticeable when lifting objects, but no associated weakness. The tremor is not present in her right hand.  She describes a sensation in her feet as if she is wearing socks when she is not. This sensation has been present since before an accident (june of this year) and has not worsened. No burning, tingling, or itching.  She has a history of Fuchs dystrophy and reports visual disturbances, including seeing a 'gray corduroy background with yellow spots and red  centers' when her eyes are closed, which is particularly annoying when reading.  She mentions hair loss, which she suspects may be related to her psoriasis. She confirms having psoriasis on her scalp.  She experiences a morning cough, which she attributes to residual blood from a past lung puncture due to an accident. The cough produces thick, colorless mucus.  She occasionally feels as though something is stuck in her chest, which resolves spontaneously. Her father had a hiatal hernia.  She takes melatonin at night to aid with sleep and finds it helpful. She also mentions arthritis in her hands, which she manages with exercises. She follows a diet that includes oatmeal, fruit, vegetables, and an egg every morning for protein.   Pertinent items are noted in HPI.     04/21/2024    9:36 AM 04/19/2023   11:12 AM 04/17/2023    8:52 AM 04/11/2022    8:18 AM 08/04/2021   10:04 AM  Depression screen PHQ 2/9  Decreased Interest 0 0 0 0 0  Down, Depressed, Hopeless 0 0 0 0 0  PHQ - 2 Score 0 0 0 0 0  Altered sleeping   0    Tired, decreased energy   1    Change in appetite   0    Feeling bad or failure about yourself    0    Trouble concentrating   0  Moving slowly or fidgety/restless   0    Suicidal thoughts   0    PHQ-9 Score   1     Difficult Petty work/chores   Not difficult at all       Data saved with a previous flowsheet row definition        No data to display             04/21/2024    9:36 AM 04/20/2024    5:27 PM 04/19/2023   11:11 AM 04/15/2023    5:59 PM 09/26/2022   12:32 PM  Fall Risk   Falls in the past year? 0 0  0 0  0  Number falls in past yr: 0  0 0  0  Injury with Fall? 0  0  0   0   Risk for fall due to : No Fall Risks  No Fall Risks Medication side effect No Fall Risks  Follow up Falls evaluation completed  Falls evaluation completed Falls prevention discussed;Falls evaluation completed Falls evaluation completed     Manually entered by patient    Data saved with a previous flowsheet row definition   Past medical history, surgical history, medications, allergies, family history and social history reviewed with patient today and changes made to appropriate areas of the chart.      Objective:    Physical Exam Vitals and nursing note reviewed.  Constitutional:      General: She is not in acute distress.    Appearance: Normal appearance.  HENT:     Head: Normocephalic and atraumatic.     Right Ear: Hearing, tympanic membrane, ear canal and external ear normal.     Left Ear: Hearing, tympanic membrane, ear canal and external ear normal.     Nose: Nose normal.     Right Sinus: No maxillary sinus tenderness or frontal sinus tenderness.     Left Sinus: No maxillary sinus tenderness or frontal sinus tenderness.     Mouth/Throat:     Lips: Pink.     Mouth: Mucous membranes are moist.     Pharynx: Oropharynx is clear.  Eyes:     General: Lids are normal. Vision grossly intact.     Extraocular Movements: Extraocular movements intact.     Conjunctiva/sclera: Conjunctivae normal.     Pupils: Pupils are equal, round, and reactive to light.     Funduscopic exam:    Right eye: Red reflex present.        Left eye: Red reflex present.    Visual Fields: Right eye visual fields normal and left eye visual fields normal.  Neck:     Thyroid : No thyromegaly.     Vascular: No carotid bruit.  Cardiovascular:     Rate and Rhythm: Normal rate and regular rhythm.     Chest Wall: PMI is not displaced.     Pulses: Normal pulses.          Dorsalis pedis pulses are 2+ on the right side and 2+ on the left side.       Posterior tibial pulses are 2+ on the right side and 2+ on the left side.     Heart sounds: Normal heart sounds. No murmur heard. Pulmonary:     Effort: Pulmonary effort is normal. No respiratory distress.     Breath sounds: Normal breath sounds.  Abdominal:     General: Abdomen is flat. Bowel sounds are normal. There is no distension.  Palpations: Abdomen is soft. There is no hepatomegaly, splenomegaly or mass.     Tenderness: There is no abdominal tenderness. There is no right CVA tenderness, left CVA tenderness, guarding or rebound.  Musculoskeletal:        General: Normal range of motion.       Arms:     Cervical back: Full passive range of motion without pain, normal range of motion and neck supple. No tenderness.     Right lower leg: No edema.     Left lower leg: No edema.       Legs:  Feet:     Left foot:     Toenail Condition: Left toenails are normal.  Lymphadenopathy:     Cervical: No cervical adenopathy.     Upper Body:     Right upper body: No supraclavicular adenopathy.     Left upper body: No supraclavicular adenopathy.  Skin:    General: Skin is warm and dry.     Capillary Refill: Capillary refill takes less than 2 seconds.     Nails: There is no clubbing.  Neurological:     General: No focal deficit present.     Mental Status: She is alert and oriented to person, place, and time.     GCS: GCS eye subscore is 4. GCS verbal subscore is 5. GCS motor subscore is 6.     Sensory: Sensation is intact.     Motor: Motor function is intact.     Coordination: Coordination is intact.     Gait: Gait is intact.     Deep Tendon Reflexes: Reflexes are normal and symmetric.  Psychiatric:        Attention and Perception: Attention normal.        Mood and Affect: Mood normal.        Speech: Speech normal.        Behavior: Behavior normal. Behavior is cooperative.        Thought Content: Thought content normal.        Cognition and Memory: Cognition and memory normal.        Judgment: Judgment normal.         Assessment & Plan:   Assessment & Plan Encounter for annual physical exam     Tremor of left hand Tremor in the left hand, primarily during lifting or holding objects. Not consistent with Parkinson's disease. Likely essential tremor, which is benign and not associated with weakness.  Reassurance provided. Will monitor labs.  - Provided information on essential tremor for education. Orders:   Vitamin B12   CBC with Differential/Platelet   Iron , TIBC and Ferritin Panel   LP+LDL Direct   TSH  Phosphene visual phenomenon Fuchs dystrophy with visual disturbances, including gray corduroy background with yellow spots and red centers when eyes are closed. Symptoms may be related to phosphenes or map-dot-fingerprint dystrophy. No pain or dizziness reported. Recommend contact ophthalmologist to discuss the changes to ensure that evaluation is not recommended sooner than March appointment.  - Discuss visual symptoms with ophthalmologist to ensure that this is not concerning and does not require immediate evaluation.  Orders:   Vitamin B12   CBC with Differential/Platelet   Iron , TIBC and Ferritin Panel   LP+LDL Direct   TSH  Chronic cough Chronic cough with thick, non-colored sputum in the morning, likely related to previous pulmonary injury and blood in the lung. Pulmonologist advised against intervention as removal would cause more trouble. Lungs are clear to auscultation. Discussed that  this could be ongoing healing vs. Cough d/t post nasal drip. Can consider mucinex.  - Encouraged deep breathing and hydration to help clear secretions.    Hair loss Hair loss possibly related to psoriasis or B12 deficiency. Scalp lesions noted, possibly related to psoriasis. - Checked B12 levels to assess for deficiency contributing to hair loss. Orders:   Vitamin B12   CBC with Differential/Platelet   Iron , TIBC and Ferritin Panel   LP+LDL Direct   TSH  Globus sensation Occasional sensation of food getting stuck in the lower portion of the esophagus, but reported as not bothersome. Symptoms may be related to hiatal hernia, but not confirmed. Explained that this would require imaging or additional screening. She does not wish for this at this time, but it aware to let me know if the  symptoms change or worsen.  - Advised on lifestyle modifications such as smaller bites, sitting upright, and avoiding lying down after eating.    Paresthesia of both feet Bilateral foot neuropathy with sensation of wearing socks without socks. No burning, tingling, or itching. Symptoms present before recent accident and not worsening. Possible neuropathy due to distance from heart or other causes. - Checked B12 levels to assess for deficiency contributing to symptoms. Orders:   Vitamin B12   CBC with Differential/Platelet   Iron , TIBC and Ferritin Panel   LP+LDL Direct   TSH  Ganglion cyst Ganglion cysts on the right wrist and left foot, likely related to previous injury or overuse. No pain or functional impairment. Mobile and not fixed, suggesting benign nature. - Continue to monitor for changes in size or symptoms. - Will consider ultrasound if growth or symptoms develop.    Low hemoglobin Repeat labs for monitoring.  Orders:   Vitamin B12   CBC with Differential/Platelet   Iron , TIBC and Ferritin Panel   TSH  Influenza vaccination given  Orders:   Flu vaccine HIGH DOSE PF(Fluzone Trivalent)  Screening for osteoporosis  Orders:   DG Bone Density; Future  NEXT PREVENTATIVE PHYSICAL DUE IN 1 YEAR.    PATIENT COUNSELING PROVIDED FOR ALL ADULT PATIENTS: A well balanced diet low in saturated fats, cholesterol, and moderation in carbohydrates.  This can be as simple as monitoring portion sizes and cutting back on sugary beverages such as soda and juice to start with.    Daily water  consumption of at least 64 ounces.  Physical activity at least 180 minutes per week.  If just starting out, start 10 minutes a day and work your way up.   This can be as simple as taking the stairs instead of the elevator and walking 2-3 laps around the office  purposefully every day.   STD protection, partner selection, and regular testing if high risk.  Limited consumption of alcoholic  beverages if alcohol  is consumed. For men, I recommend no more than 14 alcoholic beverages per week, spread out throughout the week (max 2 per day). Avoid binge drinking or consuming large quantities of alcohol  in one setting.  Please let me know if you feel you may need help with reduction or quitting alcohol  consumption.   Avoidance of nicotine, if used. Please let me know if you feel you may need help with reduction or quitting nicotine use.   Daily mental health attention. This can be in the form of 5 minute daily meditation, prayer, journaling, yoga, reflection, etc.  Purposeful attention to your emotions and mental state can significantly improve your overall wellbeing  and  Health.  Please know that I am here to help you with all of your health care goals and am happy to work with you to find a solution that works best for you.  The greatest advice I have received with any changes in life are to take it one step at a time, that even means if all you can focus on is the next 60 seconds, then do that and celebrate your victories.  With any changes in life, you will have set backs, and that is OK. The important thing to remember is, if you have a set back, it is not a failure, it is an opportunity to try again! Screening Testing Mammogram Every 1 -2 years based on history and risk factors Starting at age 23 Pap Smear Ages 21-39 every 3 years Ages 90-65 every 5 years with HPV testing More frequent testing may be required based on results and history Colon Cancer Screening Every 1-10 years based on test performed, risk factors, and history Starting at age 38 Bone Density Screening Every 2-10 years based on history Starting at age 31 for women Recommendations for men differ based on medication usage, history, and risk factors AAA Screening One time ultrasound Men 18-57 years old who have every smoked Lung Cancer Screening Low Dose Lung CT every 12 months Age 28-80 years with a  30 pack-year smoking history who still smoke or who have quit within the last 15 years   Screening Labs Routine  Labs: Complete Blood Count (CBC), Complete Metabolic Panel (CMP), Cholesterol (Lipid Panel) Every 6-12 months based on history and medications May be recommended more frequently based on current conditions or previous results Hemoglobin A1c Lab Every 3-12 months based on history and previous results Starting at age 38 or earlier with diagnosis of diabetes, high cholesterol, BMI >26, and/or risk factors Frequent monitoring for patients with diabetes to ensure blood sugar control Thyroid  Panel (TSH) Every 6 months based on history, symptoms, and risk factors May be repeated more often if on medication HIV One time testing for all patients 18 and older May be repeated more frequently for patients with increased risk factors or exposure Hepatitis C One time testing for all patients 32 and older May be repeated more frequently for patients with increased risk factors or exposure Gonorrhea, Chlamydia Every 12 months for all sexually active persons 13-24 years Additional monitoring may be recommended for those who are considered high risk or who have symptoms Every 12 months for any woman on birth control, regardless of sexual activity PSA Men 45-12 years old with risk factors Additional screening may be recommended from age 58-69 based on risk factors, symptoms, and history  Vaccine Recommendations Tetanus Booster All adults every 10 years Flu Vaccine All patients 6 months and older every year COVID Vaccine All patients 12 years and older Initial dosing with booster May recommend additional booster based on age and health history HPV Vaccine 2 doses all patients age 65-26 Dosing may be considered for patients over 26 Shingles Vaccine (Shingrix) 2 doses all adults 55 years and older Pneumonia (Pneumovax 24) All adults 65 years and older May recommend earlier dosing  based on health history One year apart from Prevnar 54 Pneumonia (Prevnar 73) All adults 65 years and older Dosed 1 year after Pneumovax 23 Pneumonia (Prevnar 20) One time alternative to the two dosing of 13 and 23 For all adults with initial dose of 23, 20 is recommended 1 year later For all adults with initial dose of 13,  23 is still recommended as second option 1 year later

## 2024-04-21 NOTE — Assessment & Plan Note (Addendum)
 Fuchs dystrophy with visual disturbances, including gray corduroy background with yellow spots and red centers when eyes are closed. Symptoms may be related to phosphenes or map-dot-fingerprint dystrophy. No pain or dizziness reported. Recommend contact ophthalmologist to discuss the changes to ensure that evaluation is not recommended sooner than March appointment.  - Discuss visual symptoms with ophthalmologist to ensure that this is not concerning and does not require immediate evaluation.  Orders:   Vitamin B12   CBC with Differential/Platelet   Iron , TIBC and Ferritin Panel   LP+LDL Direct   TSH

## 2024-04-21 NOTE — Assessment & Plan Note (Addendum)
 SABRA

## 2024-04-21 NOTE — Assessment & Plan Note (Addendum)
 Ganglion cysts on the right wrist and left foot, likely related to previous injury or overuse. No pain or functional impairment. Mobile and not fixed, suggesting benign nature. - Continue to monitor for changes in size or symptoms. - Will consider ultrasound if growth or symptoms develop.

## 2024-04-21 NOTE — Assessment & Plan Note (Addendum)
 Chronic cough with thick, non-colored sputum in the morning, likely related to previous pulmonary injury and blood in the lung. Pulmonologist advised against intervention as removal would cause more trouble. Lungs are clear to auscultation. Discussed that this could be ongoing healing vs. Cough d/t post nasal drip. Can consider mucinex.  - Encouraged deep breathing and hydration to help clear secretions.

## 2024-04-21 NOTE — Assessment & Plan Note (Addendum)
 Hair loss possibly related to psoriasis or B12 deficiency. Scalp lesions noted, possibly related to psoriasis. - Checked B12 levels to assess for deficiency contributing to hair loss. Orders:   Vitamin B12   CBC with Differential/Platelet   Iron , TIBC and Ferritin Panel   LP+LDL Direct   TSH

## 2024-04-21 NOTE — Assessment & Plan Note (Addendum)
 Tremor in the left hand, primarily during lifting or holding objects. Not consistent with Parkinson's disease. Likely essential tremor, which is benign and not associated with weakness. Reassurance provided. Will monitor labs.  - Provided information on essential tremor for education. Orders:   Vitamin B12   CBC with Differential/Platelet   Iron , TIBC and Ferritin Panel   LP+LDL Direct   TSH

## 2024-04-21 NOTE — Assessment & Plan Note (Addendum)
 Occasional sensation of food getting stuck in the lower portion of the esophagus, but reported as not bothersome. Symptoms may be related to hiatal hernia, but not confirmed. Explained that this would require imaging or additional screening. She does not wish for this at this time, but it aware to let me know if the symptoms change or worsen.  - Advised on lifestyle modifications such as smaller bites, sitting upright, and avoiding lying down after eating.

## 2024-04-21 NOTE — Assessment & Plan Note (Addendum)
 Bilateral foot neuropathy with sensation of wearing socks without socks. No burning, tingling, or itching. Symptoms present before recent accident and not worsening. Possible neuropathy due to distance from heart or other causes. - Checked B12 levels to assess for deficiency contributing to symptoms. Orders:   Vitamin B12   CBC with Differential/Platelet   Iron , TIBC and Ferritin Panel   LP+LDL Direct   TSH

## 2024-04-22 ENCOUNTER — Ambulatory Visit (INDEPENDENT_AMBULATORY_CARE_PROVIDER_SITE_OTHER): Payer: Medicare Other

## 2024-04-22 DIAGNOSIS — Z0000 Encounter for general adult medical examination without abnormal findings: Secondary | ICD-10-CM | POA: Diagnosis not present

## 2024-04-22 LAB — IRON,TIBC AND FERRITIN PANEL
Ferritin: 98 ng/mL (ref 15–150)
Iron Saturation: 25 % (ref 15–55)
Iron: 65 ug/dL (ref 27–139)
Total Iron Binding Capacity: 265 ug/dL (ref 250–450)
UIBC: 200 ug/dL (ref 118–369)

## 2024-04-22 LAB — CBC WITH DIFFERENTIAL/PLATELET
Basophils Absolute: 0.1 x10E3/uL (ref 0.0–0.2)
Basos: 1 %
EOS (ABSOLUTE): 0 x10E3/uL (ref 0.0–0.4)
Eos: 0 %
Hematocrit: 31.7 % — ABNORMAL LOW (ref 34.0–46.6)
Hemoglobin: 10.7 g/dL — ABNORMAL LOW (ref 11.1–15.9)
Immature Grans (Abs): 0 x10E3/uL (ref 0.0–0.1)
Immature Granulocytes: 0 %
Lymphocytes Absolute: 1.2 x10E3/uL (ref 0.7–3.1)
Lymphs: 15 %
MCH: 32.9 pg (ref 26.6–33.0)
MCHC: 33.8 g/dL (ref 31.5–35.7)
MCV: 98 fL — ABNORMAL HIGH (ref 79–97)
Monocytes Absolute: 1 x10E3/uL — ABNORMAL HIGH (ref 0.1–0.9)
Monocytes: 12 %
Neutrophils Absolute: 5.8 x10E3/uL (ref 1.4–7.0)
Neutrophils: 72 %
Platelets: 274 x10E3/uL (ref 150–450)
RBC: 3.25 x10E6/uL — ABNORMAL LOW (ref 3.77–5.28)
RDW: 12.1 % (ref 11.7–15.4)
WBC: 8 x10E3/uL (ref 3.4–10.8)

## 2024-04-22 LAB — VITAMIN B12: Vitamin B-12: 762 pg/mL (ref 232–1245)

## 2024-04-22 LAB — LP+LDL DIRECT
Cholesterol, Total: 196 mg/dL (ref 100–199)
HDL: 75 mg/dL (ref >39)
LDL Chol Calc (NIH): 105 mg/dL — ABNORMAL HIGH (ref 0–99)
LDL Direct: 114 mg/dL — ABNORMAL HIGH (ref 0–99)
Triglycerides: 90 mg/dL (ref 0–149)
VLDL Cholesterol Cal: 16 mg/dL (ref 5–40)

## 2024-04-22 LAB — TSH: TSH: 0.478 u[IU]/mL (ref 0.450–4.500)

## 2024-04-22 NOTE — Progress Notes (Signed)
 Chief Complaint  Patient presents with   Medicare Wellness     Subjective:   [name] is a 89 y.o. female who presents for a Medicare Annual Wellness Visit.  Visit info / Clinical Intake: Medicare Wellness Visit Type:: Subsequent Annual Wellness Visit Persons participating in visit and providing information:: patient Medicare Wellness Visit Mode:: Video Since this visit was completed virtually, some vitals may be partially provided or unavailable. Missing vitals are due to the limitations of the virtual format.: Unable to obtain vitals - no equipment If Telephone or Video please confirm:: I connected with patient using audio/video enable telemedicine. I verified patient identity with two identifiers, discussed telehealth limitations, and patient agreed to proceed. Patient Location:: home Provider Location:: office Interpreter Needed?: No Pre-visit prep was completed: yes AWV questionnaire completed by patient prior to visit?: yes Date:: 04/20/24 Living arrangements:: (!) (Patient-Rptd) lives alone Patient's Overall Health Status Rating: (Patient-Rptd) very good Typical amount of pain: (Patient-Rptd) some Does pain affect daily life?: (Patient-Rptd) no Are you currently prescribed opioids?: no  Dietary Habits and Nutritional Risks How many meals a day?: (Patient-Rptd) 3 Eats fruit and vegetables daily?: (Patient-Rptd) yes Most meals are obtained by: (Patient-Rptd) preparing own meals In the last 2 weeks, have you had any of the following?: (!) nausea, vomiting, diarrhea (diarrhea from otezela) Diabetic:: no  Functional Status Activities of Daily Living (to include ambulation/medication): (Patient-Rptd) Independent Ambulation: Independent with device- listed below (hiking sticks, rollator) Medication Administration: (Patient-Rptd) Independent Home Management (perform basic housework or laundry): (Patient-Rptd) Needs assistance (comment) Manage your own finances?:  (Patient-Rptd) yes Primary transportation is: (Patient-Rptd) family / friends Concerns about vision?: (!) yes (fooks dystrophy) Concerns about hearing?: (!) yes Uses hearing aids?: (!) yes  Fall Screening Falls in the past year?: (Patient-Rptd) 0 Number of falls in past year: 0 Was there an injury with Fall?: 0 Fall Risk Category Calculator: 0 Patient Fall Risk Level: Low Fall Risk  Fall Risk Patient at Risk for Falls Due to: Medication side effect; Impaired vision Fall risk Follow up: Falls evaluation completed; Falls prevention discussed  Home and Transportation Safety: All rugs have non-skid backing?: (Patient-Rptd) N/A, no rugs All stairs or steps have railings?: (Patient-Rptd) yes Grab bars in the bathtub or shower?: (Patient-Rptd) yes Have non-skid surface in bathtub or shower?: (!) (Patient-Rptd) no Good home lighting?: (Patient-Rptd) yes Regular seat belt use?: (Patient-Rptd) yes Hospital stays in the last year:: (!) (Patient-Rptd) yes How many hospital stays:: 1 ([facility])  Cognitive Assessment Difficulty concentrating, remembering, or making decisions? : (Patient-Rptd) no Will 6CIT or Mini Cog be Completed: yes What year is it?: 0 points What month is it?: 0 points Give patient an address phrase to remember (5 components): [address] [facility] About what time is it?: 0 points Count backwards from 20 to 1: 0 points Say the months of the year in reverse: 0 points Repeat the address phrase from earlier: 0 points 6 CIT Score: 0 points  Advance Directives (For Healthcare) Does Patient Have a Medical Advance Directive?: Yes Does patient want to make changes to medical advance directive?: No - Patient declined Type of Advance Directive: Healthcare Power of [place]; Living will Copy of Healthcare Power of Attorney in Chart?: Yes - validated most recent copy scanned in chart (See row information) Copy of Living Will in Chart?: Yes - validated most recent copy scanned in chart  (See row information) Would patient like information on creating a medical advance directive?: No - Patient declined  Reviewed/Updated  Reviewed/Updated: Reviewed All (Medical, Surgical, Family, Medications, Allergies, Care Teams, Patient Goals)    Allergies (verified) Benadryl  [diphenhydramine ], Lactose intolerance (gi), and Epinephrine    Current Medications (verified) Outpatient Encounter Medications as of 04/22/2024  Medication Sig   acetaminophen  (TYLENOL ) 500 MG tablet Take 2 tablets (1,000 mg total) by mouth 3 (three) times daily.   amLODipine  (NORVASC ) 5 MG tablet TAKE 1 TABLET(5 MG) BY MOUTH DAILY   apixaban  (ELIQUIS ) 2.5 MG TABS tablet Take 1 tablet (2.5 mg total) by mouth 2 (two) times daily.   Apremilast (OTEZLA) 10 & 20 & 30 MG TBPK as directed Orally   artificial tears ophthalmic solution Place 1 drop into both eyes as needed for dry eyes.   Cholecalciferol  (VITAMIN D3) 50 MCG (2000 UT) CAPS Take 2,000 Units by mouth daily with breakfast.   Coenzyme Q10 (COQ-10) 100 MG CAPS Take 100 mg by mouth daily with breakfast.   dofetilide  (TIKOSYN ) 250 MCG capsule TAKE 1 CAPSULE(250 MCG) BY MOUTH TWICE DAILY   levothyroxine  (SYNTHROID ) 112 MCG tablet TAKE 1 TABLET(112 MCG) BY MOUTH DAILY   losartan  (COZAAR ) 50 MG tablet Take 1 tablet (50 mg total) by mouth daily.   Magnesium  250 MG CAPS Take by mouth.   Multiple Vitamin (MULTIVITAMIN WITH MINERALS) TABS tablet Take 1 tablet by mouth daily.   Pumpkin Seed-Soy Germ (AZO BLADDER CONTROL/GO-LESS) CAPS Take 1 capsule by mouth daily. 1 daily   Turmeric (QC TUMERIC COMPLEX PO) Take 100 mg by mouth daily.   No facility-administered encounter medications on file as of 04/22/2024.    History: Past Medical History:  Diagnosis Date   Acute CVA (cerebrovascular accident) (HCC) 03/10/2014   Anemia    hx of with surgery    Arthritis 07/27/2011   osteoarthritis-hips/s/p bil. THA, arthritis right hand    Blepharitis of left eye    Blood  transfusion 06/2012   post surgery some time ago   Cancer [facility]) 1989   Breast cancer -s/p lt. mastectomy, some squamous cell lesions   Cold hands    Complication of anesthesia    epinephrine  - screams uncontrollably / pt does not want general anesthesia or narcotics   Constipation    Dyslipidemia 04/15/2015   Elevated LDL cholesterol level 01/26/2020   Fuchs' corneal dystrophy    both eyes   Hard of hearing    both ears mild loss   Hyperkalemia 03/07/2018   Hypertension    Hypothyroidism 3-21- 13   tx. levothyroxine    Lichen sclerosus et atrophicus of the vulva    pt reported.    Neuromuscular disorder (HCC) 01/2022   Osteopenia    PMR (polymyalgia rheumatica) 08/14/2022   Stroke [facility])    a. s/p MDT LINQ 04/2014   Past Surgical History:  Procedure Laterality Date   ABDOMINAL HYSTERECTOMY     ovaries removed   ANKLE SURGERY  05/09/2003   right ankle tendon repair   basal cell removed from face     3-4 times   BREAST SURGERY  07/07/1987   lt.breast cancer intraductal cancer, mastectomy   CATARACT EXTRACTION, BILATERAL  few yrs ago   Bilateral   EYE SURGERY  9-23   FRACTURE SURGERY     HARDWARE REMOVAL  04/05/2012   Procedure: HARDWARE REMOVAL;  Surgeon: Dempsey LULLA Moan, MD;  Location: [facility];  Service: Orthopedics;  Laterality: Right;  Removal of Hardware Right Femur    HARDWARE REMOVAL Left 06/30/2013   Procedure: LEFT HIP HARDWARE REMOVAL OF CABLES;  Surgeon: Dempsey LULLA Moan, MD;  Location: [facility];  Service: Orthopedics;  Laterality: Left;   HARDWARE REMOVAL Right 08/21/2013   Procedure: RIGHT HIP HARDWARE REMOVAL;  Surgeon: Dempsey LULLA Moan, MD;  Location: [facility];  Service: Orthopedics;  Laterality: Right;   JOINT REPLACEMENT  07/27/2011   Bilateral: Right THA - 04/2000, Left THA 06/2001   KNEE CLOSED REDUCTION Right 10/11/2023   Procedure: STRESS EVALUATION KNEE, CLOSED;  Surgeon: Celena Sharper, MD;  Location: [facility] OR;  Service: Orthopedics;  Laterality: Right;   left hip  replacement Left 06/08/2001   LOOP RECORDER IMPLANT  04/08/2014   MDT LINQ implanted by Dr Waddell for cryptogenic stroke   LOOP RECORDER REMOVAL N/A 04/26/2017   Procedure: LOOP RECORDER REMOVAL;  Surgeon: Waddell Danelle ORN, MD;  Location: [facility] INVASIVE CV LAB;  Service: Cardiovascular;  Laterality: N/A;   MASTECTOMY  05/09/1987   left - Pt states has no restrictions on use of L arm for BP or blood draw   melanoma removed Left    x 1   ORIF CLAVICULAR FRACTURE Left 10/11/2023   Procedure: OPEN REDUCTION INTERNAL FIXATION (ORIF) CLAVICULAR FRACTURE;  Surgeon: Celena Sharper, MD;  Location: [facility] OR;  Service: Orthopedics;  Laterality: Left;  POSSIBLE ORIF LEFT CLAVICLE AND SI SCREW FIXATION LEFT SACRUM   ORIF FEMUR FRACTURE  07/31/2011   Procedure: OPEN REDUCTION INTERNAL FIXATION (ORIF) DISTAL FEMUR FRACTURE;  Surgeon: Dempsey LULLA Moan, MD;  Location: [facility];  Service: Orthopedics;  Laterality: Right;  right femur  (c-arm)    SACRO-ILIAC PINNING Left 10/11/2023   Procedure: TRANS-SACRAL SACROILIAC SCREW LEFT TO RIGHT;  Surgeon: Celena Sharper, MD;  Location: [facility];  Service: Orthopedics;  Laterality: Left;   SQUAMOUS CELL CARCINOMA EXCISION  07/27/2011   x2 left shoulder   TOTAL HIP REVISION  04/10/2012   Procedure: TOTAL HIP REVISION;  Surgeon: Dempsey LULLA Moan, MD;  Location: [facility];  Service: Orthopedics;  Laterality: Right;   TOTAL HIP REVISION Left 06/27/2012   Procedure: LEFT TOTAL HIP REVISION;  Surgeon: Dempsey LULLA Moan, MD;  Location: [facility];  Service: Orthopedics;  Laterality: Left;   Family History  Problem Relation Age of Onset   Hodgkin's lymphoma Mother    Celiac disease Mother    Cancer Mother    Miscarriages / Stillbirths Mother    Melanoma Father    Cancer Father    Cancer Brother    Arthritis Sister    Ulcerative colitis Sister    Depression Sister    Hearing loss Sister    Kidney disease Sister    Miscarriages / Stillbirths Sister    Heart disease Paternal Grandmother     Heart disease Paternal Grandfather    Vision loss Paternal Grandfather    Hearing loss Maternal Grandmother    Social History   Occupational History   Occupation: Retired  Tobacco Use   Smoking status: Former    Current packs/day: 0.00    Average packs/day: 1 pack/day for 10.0 years (10.0 ttl pk-yrs)    Types: Cigarettes    Start date: 05/08/1953    Quit date: 05/09/1963    Years since quitting: 60.9    Passive exposure: Never   Smokeless tobacco: Never  Vaping Use   Vaping status: Never Used  Substance and Sexual Activity   Alcohol  use: Not Currently    Alcohol /week: 1.0 standard drink of alcohol     Types: 1 Glasses of wine per week    Comment: very little   Drug  use: No   Sexual activity: Not Currently   Tobacco Counseling Counseling given: Not Answered  SDOH Screenings   Food Insecurity: No Food Insecurity (04/20/2024)  Housing: Unknown (04/20/2024)  Transportation Needs: No Transportation Needs (04/20/2024)  Utilities: Not At Risk (04/22/2024)  Alcohol  Screen: Low Risk (04/20/2024)  Depression (PHQ2-9): Low Risk (04/22/2024)  Financial Resource Strain: Low Risk (04/20/2024)  Physical Activity: Sufficiently Active (04/22/2024)  Social Connections: Moderately Integrated (04/20/2024)  Stress: No Stress Concern Present (04/20/2024)  Tobacco Use: Medium Risk (04/21/2024)  Health Literacy: Adequate Health Literacy (04/22/2024)   See flowsheets for full screening details  Depression Screen PHQ 2 & 9 Depression Scale- Over the past 2 weeks, how often have you been bothered by any of the following problems? Little interest or pleasure in doing things: 0 Feeling down, depressed, or hopeless (PHQ Adolescent also includes...irritable): 0 PHQ-2 Total Score: 0 Trouble falling or staying asleep, or sleeping too much: 1 Feeling tired or having little energy: 0 Poor appetite or overeating (PHQ Adolescent also includes...weight loss): 0 Feeling bad about yourself - or that you  are a failure or have let yourself or your family down: 0 Trouble concentrating on things, such as reading the newspaper or watching television (PHQ Adolescent also includes...like school work): 0 Moving or speaking so slowly that other people could have noticed. Or the opposite - being so fidgety or restless that you have been moving around a lot more than usual: 0 Thoughts that you would be better off dead, or of hurting yourself in some way: 0 PHQ-9 Total Score: 1 If you checked off any problems, how difficult have these problems made it for you to do your work, take care of things at home, or get along with other people?: Not difficult at all  Depression Treatment Depression Interventions/Treatment : EYV7-0 Score <4 Follow-up Not Indicated     Goals Addressed             This Visit's Progress    Patient Stated       04/22/2024, wants to get back to walking in the woods             Objective:    Today's Vitals   There is no height or weight on file to calculate BMI.  Hearing/Vision screen Hearing Screening - Comments:: Has hearing aids that are maintained Vision Screening - Comments:: Regular eye exams, [place] Immunizations and Health Maintenance Health Maintenance  Topic Date Due   Bone Density Scan  02/05/2023   COVID-19 Vaccine (8 - Pfizer risk 2025-26 season) 07/23/2024   Medicare Annual Wellness (AWV)  04/22/2025   DTaP/Tdap/Td (7 - Td or Tdap) 02/18/2032   Pneumococcal Vaccine: 50+ Years  Completed   Influenza Vaccine  Completed   Zoster Vaccines- Shingrix  Completed   Meningococcal B Vaccine  Aged Out   Hepatitis B Vaccines 19-59 Average Risk  Discontinued   Mammogram  Discontinued        Assessment/Plan:  This is a routine wellness examination for [facility].  Patient Care Team: Early, Camie BRAVO, NP as PCP - General (Nurse Practitioner) Leverne Charlies Helling, PA-C as Physician Assistant (Cardiology) Octavia Bruckner, MD as Consulting Physician  (Ophthalmology)  I have personally reviewed and noted the following in the patients chart:   Medical and social history Use of alcohol , tobacco or illicit drugs  Current medications and supplements including opioid prescriptions. Functional ability and status Nutritional status Physical activity Advanced directives List of other physicians Hospitalizations, surgeries, and ER visits  in previous 12 months Vitals Screenings to include cognitive, depression, and falls Referrals and appointments  No orders of the defined types were placed in this encounter.  In addition, I have reviewed and discussed with patient certain preventive protocols, quality metrics, and best practice recommendations. A written personalized care plan for preventive services as well as general preventive health recommendations were provided to patient.   Ardella FORBES Dawn, LPN   [id number]   Return in 1 year (on 04/22/2025).  After Visit Summary: (MyChart) Due to this being a telephonic visit, the after visit summary with patients personalized plan was offered to patient via MyChart   Nurse Notes: HM Addressed: DEXA orders on 04/21/2024 by Camie Hun

## 2024-04-22 NOTE — Patient Instructions (Signed)
 [name],  Thank you for taking the time for your Medicare Wellness Visit. I appreciate your continued commitment to your health goals. Please review the care plan we discussed, and feel free to reach out if I can assist you further.  Please note that Annual Wellness Visits do not include a physical exam. Some assessments may be limited, especially if the visit was conducted virtually. If needed, we may recommend an in-person follow-up with your provider.  Ongoing Care Seeing your primary care provider every 3 to 6 months helps us  monitor your health and provide consistent, personalized care.   Referrals If a referral was made during today's visit and you haven't received any updates within two weeks, please contact the referred provider directly to check on the status.  Recommended Screenings:  Health Maintenance  Topic Date Due   Osteoporosis screening with Bone Density Scan  02/05/2023   Medicare Annual Wellness Visit  04/16/2024   COVID-19 Vaccine (8 - Pfizer risk 2025-26 season) 07/23/2024   DTaP/Tdap/Td vaccine (7 - Td or Tdap) 02/18/2032   Pneumococcal Vaccine for age over 31  Completed   Flu Shot  Completed   Zoster (Shingles) Vaccine  Completed   Meningitis B Vaccine  Aged Out   Hepatitis B Vaccine  Discontinued   Breast Cancer Screening  Discontinued       04/21/2024    4:47 PM  Advanced Directives  Does Patient Have a Medical Advance Directive? Yes  Type of Estate Agent of [place];Living will  Does patient want to make changes to medical advance directive? No - Patient declined  Copy of Healthcare Power of Attorney in Chart? Yes - validated most recent copy scanned in chart (See row information)    Vision: Annual vision screenings are recommended for early detection of glaucoma, cataracts, and diabetic retinopathy. These exams can also reveal signs of chronic conditions such as diabetes and high blood pressure.  Dental: Annual dental  screenings help detect early signs of oral cancer, gum disease, and other conditions linked to overall health, including heart disease and diabetes.  Please see the attached documents for additional preventive care recommendations.

## 2024-04-30 ENCOUNTER — Ambulatory Visit: Payer: Self-pay | Admitting: Nurse Practitioner

## 2024-04-30 DIAGNOSIS — E039 Hypothyroidism, unspecified: Secondary | ICD-10-CM

## 2024-04-30 MED ORDER — LEVOTHYROXINE SODIUM 100 MCG PO TABS: 100.0000 ug | ORAL_TABLET | Freq: Every day | ORAL | 0 refills | Status: AC

## 2024-04-30 NOTE — Progress Notes (Signed)
 Called Patient to go over labs, Pt stated it wasn't a good time and asked for a call back.

## 2024-07-29 ENCOUNTER — Ambulatory Visit: Admitting: Physician Assistant

## 2025-04-21 ENCOUNTER — Ambulatory Visit: Admitting: Nurse Practitioner

## 2025-04-28 ENCOUNTER — Ambulatory Visit
